# Patient Record
Sex: Female | Born: 1945
Health system: Southern US, Community
[De-identification: ages and names within clinical notes are randomized; demographics above are authoritative.]

## PROBLEM LIST (undated history)

## (undated) DIAGNOSIS — T7840XA Allergy, unspecified, initial encounter: Secondary | ICD-10-CM

## (undated) DIAGNOSIS — IMO0001 Reserved for inherently not codable concepts without codable children: Secondary | ICD-10-CM

## (undated) DIAGNOSIS — E119 Type 2 diabetes mellitus without complications: Secondary | ICD-10-CM

## (undated) DIAGNOSIS — M1990 Unspecified osteoarthritis, unspecified site: Secondary | ICD-10-CM

## (undated) DIAGNOSIS — J302 Other seasonal allergic rhinitis: Secondary | ICD-10-CM

## (undated) DIAGNOSIS — I471 Supraventricular tachycardia, unspecified: Secondary | ICD-10-CM

## (undated) DIAGNOSIS — E785 Hyperlipidemia, unspecified: Secondary | ICD-10-CM

## (undated) DIAGNOSIS — J069 Acute upper respiratory infection, unspecified: Secondary | ICD-10-CM

## (undated) DIAGNOSIS — D219 Benign neoplasm of connective and other soft tissue, unspecified: Secondary | ICD-10-CM

## (undated) DIAGNOSIS — R3915 Urgency of urination: Secondary | ICD-10-CM

## (undated) DIAGNOSIS — J309 Allergic rhinitis, unspecified: Secondary | ICD-10-CM

## (undated) DIAGNOSIS — G4730 Sleep apnea, unspecified: Secondary | ICD-10-CM

## (undated) DIAGNOSIS — N950 Postmenopausal bleeding: Secondary | ICD-10-CM

## (undated) DIAGNOSIS — Z98811 Dental restoration status: Secondary | ICD-10-CM

## (undated) DIAGNOSIS — I10 Essential (primary) hypertension: Secondary | ICD-10-CM

## (undated) DIAGNOSIS — D649 Anemia, unspecified: Secondary | ICD-10-CM

## (undated) DIAGNOSIS — Z923 Personal history of irradiation: Secondary | ICD-10-CM

## (undated) DIAGNOSIS — G4733 Obstructive sleep apnea (adult) (pediatric): Secondary | ICD-10-CM

## (undated) DIAGNOSIS — C50919 Malignant neoplasm of unspecified site of unspecified female breast: Secondary | ICD-10-CM

## (undated) DIAGNOSIS — L309 Dermatitis, unspecified: Secondary | ICD-10-CM

## (undated) DIAGNOSIS — R51 Headache: Secondary | ICD-10-CM

## (undated) DIAGNOSIS — Z8742 Personal history of other diseases of the female genital tract: Secondary | ICD-10-CM

## (undated) DIAGNOSIS — H409 Unspecified glaucoma: Secondary | ICD-10-CM

## (undated) DIAGNOSIS — M4800 Spinal stenosis, site unspecified: Secondary | ICD-10-CM

## (undated) DIAGNOSIS — J45909 Unspecified asthma, uncomplicated: Secondary | ICD-10-CM

## (undated) HISTORY — DX: Spinal stenosis, site unspecified: M48.00

## (undated) HISTORY — DX: Sleep apnea, unspecified: G47.30

## (undated) HISTORY — DX: Dermatitis, unspecified: L30.9

## (undated) HISTORY — DX: Obstructive sleep apnea (adult) (pediatric): G47.33

## (undated) HISTORY — DX: Unspecified asthma, uncomplicated: J45.909

## (undated) HISTORY — DX: Postmenopausal bleeding: N95.0

## (undated) HISTORY — DX: Allergy, unspecified, initial encounter: T78.40XA

## (undated) HISTORY — DX: Personal history of irradiation: Z92.3

## (undated) HISTORY — DX: Malignant neoplasm of unspecified site of unspecified female breast: C50.919

## (undated) HISTORY — DX: Benign neoplasm of connective and other soft tissue, unspecified: D21.9

## (undated) HISTORY — DX: Acute upper respiratory infection, unspecified: J06.9

## (undated) HISTORY — DX: Allergic rhinitis, unspecified: J30.9

## (undated) HISTORY — DX: Hyperlipidemia, unspecified: E78.5

---

## 1993-03-19 HISTORY — PX: CHOLECYSTECTOMY: SHX55

## 1995-03-20 HISTORY — PX: BACK SURGERY: SHX140

## 1997-12-30 ENCOUNTER — Other Ambulatory Visit: Admission: RE | Admit: 1997-12-30 | Discharge: 1997-12-30 | Payer: Self-pay | Admitting: *Deleted

## 1999-04-05 ENCOUNTER — Other Ambulatory Visit: Admission: RE | Admit: 1999-04-05 | Discharge: 1999-04-05 | Payer: Self-pay | Admitting: *Deleted

## 1999-04-20 ENCOUNTER — Encounter (INDEPENDENT_AMBULATORY_CARE_PROVIDER_SITE_OTHER): Payer: Self-pay | Admitting: Specialist

## 1999-04-20 ENCOUNTER — Other Ambulatory Visit: Admission: RE | Admit: 1999-04-20 | Discharge: 1999-04-20 | Payer: Self-pay | Admitting: *Deleted

## 2000-12-13 ENCOUNTER — Other Ambulatory Visit: Admission: RE | Admit: 2000-12-13 | Discharge: 2000-12-13 | Payer: Self-pay | Admitting: *Deleted

## 2002-03-24 ENCOUNTER — Other Ambulatory Visit: Admission: RE | Admit: 2002-03-24 | Discharge: 2002-03-24 | Payer: Self-pay | Admitting: *Deleted

## 2003-03-20 HISTORY — PX: DILATION AND CURETTAGE OF UTERUS: SHX78

## 2004-02-15 ENCOUNTER — Other Ambulatory Visit: Admission: RE | Admit: 2004-02-15 | Discharge: 2004-02-15 | Payer: Self-pay | Admitting: Gynecology

## 2005-03-15 ENCOUNTER — Other Ambulatory Visit: Admission: RE | Admit: 2005-03-15 | Discharge: 2005-03-15 | Payer: Self-pay | Admitting: Gynecology

## 2007-03-20 HISTORY — PX: COLONOSCOPY W/ POLYPECTOMY: SHX1380

## 2007-06-30 ENCOUNTER — Ambulatory Visit (HOSPITAL_COMMUNITY): Admission: RE | Admit: 2007-06-30 | Discharge: 2007-07-01 | Payer: Self-pay | Admitting: *Deleted

## 2007-06-30 HISTORY — PX: EXPLORATORY LAPAROTOMY: SUR591

## 2007-08-08 ENCOUNTER — Ambulatory Visit: Payer: Self-pay | Admitting: Gastroenterology

## 2007-08-22 ENCOUNTER — Ambulatory Visit: Payer: Self-pay | Admitting: Gastroenterology

## 2007-08-25 ENCOUNTER — Encounter: Payer: Self-pay | Admitting: Gastroenterology

## 2007-12-29 ENCOUNTER — Other Ambulatory Visit: Admission: RE | Admit: 2007-12-29 | Discharge: 2007-12-29 | Payer: Self-pay | Admitting: Gynecology

## 2008-01-16 ENCOUNTER — Ambulatory Visit: Admission: RE | Admit: 2008-01-16 | Discharge: 2008-01-16 | Payer: Self-pay | Admitting: Gynecology

## 2009-05-06 ENCOUNTER — Ambulatory Visit (HOSPITAL_COMMUNITY): Admission: RE | Admit: 2009-05-06 | Discharge: 2009-05-06 | Payer: Self-pay | Admitting: Obstetrics and Gynecology

## 2009-05-06 HISTORY — PX: HYSTEROSCOPY W/D&C: SHX1775

## 2010-06-07 LAB — COMPREHENSIVE METABOLIC PANEL
ALT: 25 U/L (ref 0–35)
AST: 25 U/L (ref 0–37)
CO2: 24 mEq/L (ref 19–32)
Calcium: 9.8 mg/dL (ref 8.4–10.5)
Chloride: 102 mEq/L (ref 96–112)
GFR calc Af Amer: >60 mL/min (ref >60)
GFR calc non Af Amer: >60 mL/min (ref >60)
Potassium: 3.9 mEq/L (ref 3.5–5.1)
Sodium: 134 mEq/L — ABNORMAL LOW (ref 135–145)
Total Bilirubin: 1.1 mg/dL (ref 0.3–1.2)

## 2010-06-07 LAB — CBC
RBC: 4.79 MIL/uL (ref 3.87–5.11)
WBC: 8.8 10*3/uL (ref 4.0–10.5)

## 2010-08-01 NOTE — Discharge Summary (Signed)
NAME:  [name], [name]              ACCOUNT NO.:  [id number]   MEDICAL RECORD NO.:  [id number]          PATIENT TYPE:  OIB   LOCATION:  9305                          FACILITY:  [facility]   PHYSICIAN:  Almedia Balls. Fore, M.D.   DATE OF BIRTH:  1945/08/12   DATE OF ADMISSION:  06/30/2007  DATE OF DISCHARGE:  07/01/2007                               DISCHARGE SUMMARY   HISTORY:  The patient is a 65 year old with persistent postmenopausal  bleeding for hysterectomy on June 30, 2007.  The remainder of her  history and physical are as previously dictated.   LABORATORY DATA:  Preoperative hemoglobin 13.1.  EKG with notation  cannot rule out anterior infarct and therefore abnormal EKG.   HOSPITAL COURSE:  The patient was taken to the operating room on June 30, 2007, at which time exploratory laparotomy was performed with  findings of dense pelvic adhesions involving the uterus and the lower  rectosigmoid.  This was felt to be related to probable endometriosis.  Because of the density of these adhesions, it was impossible to perform  hysterectomy as planned.  Therefore, after exploratory laparotomy, the  procedure was terminated.  Hospital course remaining overnight the  patient did well.  Diet and ambulation were progressed over the evening  of June 30, 2007, and early morning of July 01, 2007.  On the morning  of July 01, 2007,  she was afebrile and experiencing no problems, it  was felt that she could be discharged at this time.   FINAL DIAGNOSIS:  Persisting postmenopausal bleeding.   OPERATION:  Exploratory laparotomy, no pathology report.   DISPOSITION:  Discharged home to return to the office in 2 weeks for  followup and to gradually progress her activities over several weeks at  home.  She was instructed to limit lifting and driving for 2 weeks.  She  was fully ambulatory, on a regular diet, and in good condition at the  time of discharge.  She had been given a prescription for  hydrocodone/APAP 10/325, #30 to be taken one q.6 h. p.r.n. pain.           ______________________________  Almedia Balls. [name], M.D.     SRF/MEDQ  D:  07/01/2007  T:  07/02/2007  Job:  [id number]

## 2010-08-01 NOTE — H&P (Signed)
NAME:  [name], [name]              ACCOUNT NO.:  [id number]   MEDICAL RECORD NO.:  [id number]          [name] TYPE:  AMB   LOCATION:  SDC                           FACILITY:  [facility]   PHYSICIAN:  Almedia Balls. Fore, M.D.   DATE OF BIRTH:  [birth year]   DATE OF ADMISSION:  06/30/2007  DATE OF DISCHARGE:                              HISTORY & PHYSICAL   HISTORY:  The [name] is a 65 year old gravida 4, para 4, who has had  persistent postmenopausal bleeding over several years.  She has been  sampled several times and had underwent hysteroscopy D&C in February  2007 with findings of hyperplasia without atypia.  She has had  __________  aspirates since then, most recently within about the past 8  months, with benign findings.  She has persisted in her bleeding without  obvious etiology.  This has been attempted to be controlled with  progestational agents without success.  She is admitted at this time for  the persistent abnormal postmenopausal bleeding for hysterectomy,  possible bilateral salpingo-oophorectomy.  She has been fully counseled  as to the nature of the procedure and the risks involved, to include  risks of anesthesia, injury to bowel, bladder, blood vessels, ureters,  postoperative hemorrhage, infection, recuperation.  She fully  understands all these considerations and wishes to proceed on 30 June 2007.   PAST MEDICAL HISTORY:  Includes cholecystectomy in 95, disk surgery in  97, hysteroscopy D&C as noted above.  She takes medication for  occasional asthmatic changes, and this is related to seasonal allergies.   ALLERGIES:  SHE IS ALLERGIC TO NO MEDICATIONS.   FAMILY HISTORY:  Includes paternal and maternal aunt with cancer of  unknown type.  Mother and maternal grandfather and sister with heart  disease.   REVIEW OF SYSTEMS:  HEENT:  Headaches occasionally prior.  CARDIO-  RESPIRATORY: Negative.  GASTROINTESTINAL:  Negative.  GENITOURINARY:  As  in present illness.   NEUROMUSCULAR:  Negative.   PHYSICAL EXAMINATION:  VITAL SIGNS:  Height 5 feet 2 inches, weight 230  pounds, blood pressure 184/80.  This has ranged as low as 140s/70s,  pulse 84, respirations 18.  GENERAL:  Well-developed white female in no acute distress.  HEENT: Within normal limits.  NECK:  Supple without masses, adenopathy or bruits.  HEART:  Regular rate and rhythm without murmurs.  LUNGS:  Clear to P&A.  BREASTS:  Symmetrical without mass.  Axilla negative.  ABDOMEN:  Flat and soft without mass, nontender, increased panniculus.  PELVIC:  External genitalia:  Bartholins, urethra and Skene's glands  within normal limits.  Cervix is high and slightly inflamed.  Uterus is  mid-posterior, approximately 8 to [redacted] weeks gestational size, nontender.  There are no palpable adnexal masses.  EXTREMITIES:  Within normal limits.  CENTRAL NERVOUS SYSTEM:  Grossly intact.  SKIN:  Without suspicious lesions.   IMPRESSION:  PERSISTENT RECURRING POSTMENOPAUSAL BLEEDING.   DISPOSITION:  As noted above.  Of note is that a Pap smear has been  normal recently.           ______________________________  Almedia Balls.  [name], M.D.     SRF/MEDQ  D:  06/23/2007  T:  06/23/2007  Job:  [id number]

## 2010-08-01 NOTE — Op Note (Signed)
NAME:  [name], [name]              ACCOUNT NO.:  [id number]   MEDICAL RECORD NO.:  [id number]          [name] TYPE:  AMB   LOCATION:  SDC                           [facility]ACILITY:  [facility]   PHYSICIAN:  Almedia Balls. Fore, M.D.   DATE OF BIRTH:  [birth year]   DATE OF PROCEDURE:  06/30/2007  DATE OF DISCHARGE:                               OPERATIVE REPORT   PREOPERATIVE DIAGNOSIS:  Postmenopausal bleeding - persistent.   POSTOPERATIVE DIAGNOSIS:  Postmenopausal bleeding - persistent.  Extensive endometriosis with dense pelvic adhesions.   OPERATION:  Exploratory laparotomy.   ANESTHESIA:  General orotracheal.   OPERATOR:  Almedia Balls. [name], M.D.   FIRST ASSISTANT:  Gretta Cool, M.D.   INDICATIONS FOR SURGERY:  The [name] is a 65 year old with the above-  noted problems who has been counseled as to the need for surgery to  treat these problems.  She was fully counseled as to the nature of the  procedure and the risks involved to include risks of anesthesia, injury  to bowel, bladder, blood vessels, ureters, postoperative hemorrhage,  infection, recuperation and possible hormone replacement should her  ovaries be removed.  She did wish to retain her ovaries if they were  normal.  She fully understands all these considerations and has signed  informed consent to proceed on June 30, 2007.   OPERATIVE FINDINGS:  On an attempt to effect descent of the uterus, it  was found that the uterus was quite fixed in the pelvis and would not  descend at all.  Accordingly, an exploratory laparotomy was performed.  After entry into the peritoneum, palpation of the upper abdominal  viscera revealed all to be normal.  In the pelvis, the uterus was mid  position and very densely adherent with apparent endometriosis to the  descending rectosigmoid.  Dissection planes were not found, making  probable resection of bowel necessary to proceed with this proposed  operation.  Because of the normal appearance of  the uterus, otherwise  and ovaries and tubes, it was felt that no further surgery should be  performed at this time.   PROCEDURE:  With the [name] under general anesthesia, prepared and  draped in the usual sterile fashion in the dorsal lithotomy position,  speculum was placed in vagina.  An attempt was made to effect descent of  the uterus by attaching a tenaculum to the anterior lip of the cervix.  The uterus would not descend of the pelvis into the vaginal area.  Accordingly, the [name] was reprepared and draped for laparotomy  procedure.  A lower abdominal transverse incision was made and carried  into the peritoneal cavity with placement of a self-retaining retractor  and the bowel being packed off.  The above-noted findings were then  palpated and/or visualized.  Again because of the risks to the bowel, it  was felt that no further surgery was indicated.  Accordingly, after  ascertaining that sponge counts were correct and hemostasis was  maintained, the peritoneum was closed with a continuous suture of zero  Vicryl.  Fascia was closed with two sutures of zero PDS which  were  brought from the lateral aspects of the incision and tied together in  the midline.  Subcutaneous fat was reapproximated with interrupted  horizontal mattress sutures of zero  Vicryl.  Skin was closed with a subcuticular suture of 3-0 plain catgut.  Estimated blood loss less than 25 mL.  The [name] was taken to the  recovery room in good condition with clear urine in the Foley catheter  tubing.  She will be placed on outpatient with extended recovery status  following PACU.           ______________________________  Almedia Balls. [name], M.D.     SRF/MEDQ  D:  06/30/2007  T:  06/30/2007  Job:  [id number]   cc:   Gretta Cool, M.D.  Fax: [phone]

## 2010-08-01 NOTE — Consult Note (Signed)
NAME:  [name], [name] NO.:  [id number]   MEDICAL RECORD NO.:  [id number]          PATIENT TYPE:  OUT   LOCATION:  GYN                          FACILITY:  [facility]   PHYSICIAN:  De Blanch, M.D.DATE OF BIRTH:  1945/08/26   DATE OF CONSULTATION:  01/16/2008  DATE OF DISCHARGE:                                 CONSULTATION   CHIEF COMPLAINT:  Abnormal postmenopausal uterine bleeding and  endometrial hyperplasia.   HISTORY OF PRESENT ILLNESS:  A 65 year old white female seen in  consultation at the request of Dr. Teodora Medici regarding management of  abnormal bleeding and endometrial hyperplasia.  The patient has had  abnormal bleeding for at least the last 4 years and has had two D and Cs  along the way as well as a number of endometrial biopsies.  On most  endometrial biopsies she has endometrial hyperplasia without atypia.  None of her biopsies have shown atypia.  Some have shown proliferative  endometrium.  The patient has been treated with Provera in the past.  It  is difficult for me to ascertain the exact regimen.  In questioning the  patient she says that many times over the last 5 years she has only  taken the Provera for 5-10 days and it has not been repeated in  subsequent months.  In April of 2009 the patient underwent exploratory  laparotomy for a planned hysterectomy to stop her bleeding.  Apparently  extensive adhesions were encountered and the hysterectomy was aborted.   Most recently the patient was placed on Provera 30 mg a day for 10 days.  She claims that she bled through that regimen but then took prenatal  multivitamin and the bleeding stopped.  The patient is quite adamant  that the multivitamin has helped stop her bleeding on previous occasions  as well.   PAST MEDICAL HISTORY:  Medical illnesses - diabetes (untreated), urinary  tract infection, diverticulitis.   PAST SURGICAL HISTORY:  Exploratory laparotomy, cholecystectomy, D and  C  x2.   OBSTETRICAL HISTORY:  Gravida 4.   CURRENT MEDICATIONS:  None.   DRUG ALLERGIES:  None.   REVIEW OF SYSTEMS:  A 10 point comprehensive review of systems is  negative except as noted above.   PHYSICAL EXAM:  VITAL SIGNS:  Weight 237 pounds, height 5 feet 2 inches,  blood pressure 151/87, pulse 102.  GENERAL:  The patient is a pleasant obese white female in no acute  distress.  HEENT:  Negative.  NECK:  Supple without thyromegaly.  There is no supraclavicular or  inguinal adenopathy.  ABDOMEN:  The abdomen is obese, soft, nontender.  No masses,  organomegaly, ascites or hernias noted.  PELVIC:  EG/BUS/vagina/bladder/urethra are normal.  Cervix appears  normal.  There is no bleeding today.  Bimanual exam was compromised by  the patient's obesity.  I do not feel any large masses.  Rectovaginal  exam confirms.   IMPRESSION:  Postmenopausal bleeding, status post multiple biopsies and  D and Cs which show only hyperplasia.  I explained to the patient and  her daughter the natural history and etiology  for this problem being  unopposed estrogen.  While she has been given Provera in the past  apparently these have been short courses.  I would suggest that we treat  the patient with continuous Provera 10 mg daily for the next 3 months.  She was informed she should have some spotting here and there but it  should get better.  She will contact us if she has any heavy bleeding.  She is scheduled to see Dr. Chevis Pretty in approximately 3 months and I would  suggest she have a repeat biopsy at that time.  The patient is also  encouraged to keep a calendar of her bleeding pattern.      De Blanch, M.D.  Electronically Signed     DC/MEDQ  D:  01/16/2008  T:  01/17/2008  Job:  [id number]   cc:   Leatha Gilding. Mezer, M.D.  Fax: [phone]   Telford Nab, R.N.  [address]  [place], [place] [address]

## 2010-12-12 LAB — CBC
MCHC: 33.8
MCV: 84.9
Platelets: 357
RBC: 4.56
RDW: 14.6

## 2011-03-05 ENCOUNTER — Encounter (INDEPENDENT_AMBULATORY_CARE_PROVIDER_SITE_OTHER): Payer: Medicare Other | Admitting: Family Medicine

## 2011-03-05 DIAGNOSIS — E119 Type 2 diabetes mellitus without complications: Secondary | ICD-10-CM

## 2011-03-05 DIAGNOSIS — Z01419 Encounter for gynecological examination (general) (routine) without abnormal findings: Secondary | ICD-10-CM

## 2011-03-05 DIAGNOSIS — Z0000 Encounter for general adult medical examination without abnormal findings: Secondary | ICD-10-CM

## 2011-04-26 ENCOUNTER — Encounter: Payer: Self-pay | Admitting: Gynecologic Oncology

## 2011-04-30 ENCOUNTER — Other Ambulatory Visit: Payer: Self-pay | Admitting: Gynecology

## 2011-04-30 ENCOUNTER — Ambulatory Visit: Payer: Medicare Other | Attending: Gynecology | Admitting: Gynecology

## 2011-04-30 ENCOUNTER — Encounter: Payer: Self-pay | Admitting: Gynecology

## 2011-04-30 VITALS — BP 168/70 | HR 88 | Temp 97.8°F | Resp 20 | Ht 61.5 in | Wt 232.9 lb

## 2011-04-30 DIAGNOSIS — N8501 Benign endometrial hyperplasia: Secondary | ICD-10-CM | POA: Diagnosis not present

## 2011-04-30 DIAGNOSIS — Z7982 Long term (current) use of aspirin: Secondary | ICD-10-CM | POA: Insufficient documentation

## 2011-04-30 DIAGNOSIS — N950 Postmenopausal bleeding: Secondary | ICD-10-CM | POA: Diagnosis not present

## 2011-04-30 DIAGNOSIS — E119 Type 2 diabetes mellitus without complications: Secondary | ICD-10-CM | POA: Insufficient documentation

## 2011-04-30 DIAGNOSIS — N939 Abnormal uterine and vaginal bleeding, unspecified: Secondary | ICD-10-CM

## 2011-04-30 DIAGNOSIS — N898 Other specified noninflammatory disorders of vagina: Secondary | ICD-10-CM | POA: Diagnosis not present

## 2011-04-30 DIAGNOSIS — Z79899 Other long term (current) drug therapy: Secondary | ICD-10-CM | POA: Insufficient documentation

## 2011-04-30 NOTE — Patient Instructions (Signed)
We will call you with the endometrial biopsy report and make plans for further management thereafter

## 2011-04-30 NOTE — Progress Notes (Signed)
Consult Note: Gyn-Onc   [name] 66 y.o. female  Chief Complaint  Patient presents with  . Postmenopausal Bleeding    Follow up    Interval History: The patient returns at the request of Dr. Chevis Pretty for further management of vaginal bleeding.  The patient says her bleeding is typified by spotting and not on every day.  She has no other pelvic or gyn symptoms.  Dr. Corwin Levins note says a recent endometrial stripe measured 2 cm.  No biopsy has been done yet.    HPI: The patient has a long-standing history of abnormal bleeding. She has undergone several D&Cs and endometrial biopsies some of which have shown simple hyperplasia without atypia. For a time she was treated with Provera but on an inconsistent regimen. She underwent exploratory laparotomy in April 2009 for a planned hysterectomy to manage her bleeding. However extensive adhesions were encountered and the procedure was abandoned. In February 2011 she underwent hysteroscopy and D&C which revealed superficial fragments of weakly proliferative endometrium. No atypia hyperplasia or malignancy was identified. It is interesting to note that this was proliferative endometrium given the fact that the patient was supposed to have been taking Provera. Since 2011 she has not received any additional hormonal management.  Allergies  Allergen Reactions  . Latex     Causes blisters    Past Medical History  Diagnosis Date  . Postmenopausal bleeding   . Fibroids   . Diabetes mellitus   . Diverticulitis   . Endometrial hyperplasia     Past Surgical History  Procedure Date  . Hysteroscopy w/d&c 04/2009  . Abdominal hysterectomy 06/2007  . Cholecystectomy   . Back surgery   . Dilation and curettage of uterus   . Exploratory laparotomy     Current Outpatient Prescriptions  Medication Sig Dispense Refill  . aspirin 81 MG tablet Take 81 mg by mouth daily.      . Cholecalciferol (VITAMIN D-3 PO) Take by mouth daily.      . Cyanocobalamin  (VITAMIN B-12 CR PO) Take 1 tablet by mouth daily. Unsure of dose      . LISINOPRIL PO Take 1 tablet by mouth daily.      . metFORMIN (GLUCOPHAGE) 500 MG tablet Take 1,000 mg by mouth daily.      Marland Kitchen PRAVASTATIN SODIUM PO Take 1 tablet by mouth daily.      . Prenatal Multivit-Min-Fe-FA (PRENATAL 1 + IRON PO) Take by mouth daily.      . vitamin C (ASCORBIC ACID) 500 MG tablet Take 500 mg by mouth daily.        History   Social History  . Marital Status: Married    Spouse Name: N/A    Number of Children: N/A  . Years of Education: N/A   Occupational History  . Not on file.   Social History Main Topics  . Smoking status: Never Smoker   . Smokeless tobacco: Not on file  . Alcohol Use: No  . Drug Use: No  . Sexually Active:    Other Topics Concern  . Not on file   Social History Narrative  . No narrative on file    No family history on file.  Review of Systems: 10 point review of systems negative except as noted above  Vitals: Blood pressure 168/70, pulse 88, temperature 97.8 F (36.6 C), temperature source Oral, resp. rate 20, height 5' 1.5" (1.562 m), weight 232 lb 14.4 oz (105.643 kg).  Physical Exam: In general patient  is a healthy white female in no acute distress  HEENT is negative  Neck is supple without thyromegaly  There is no supraclavicular or inguinal adenopathy.  The abdomen is obese soft nontender no masses organomegaly ascites or hernias are noted. Pelvic exam  EGBUS vagina bladder urethra are normal  Cervix is normal uterus is anterior normal shape size and consistency  Bimanual exam reveals no adnexal masses  Rectovaginal exam confirms. It's noted that the patient is not having any bleeding at the present time.  Procedure note:  Endometrial biopsies obtained a retrieving a modest amount of endometrial tissue.    Assessment/Plan: Continued abnormal uterine bleeding. With aid endometrial stripe of 2 cm we've obtained an endometrial biopsy for  reassessment. We'll contact the patient with that report and make further recommendations thereafter.   Diane Corpus, MD 04/30/2011, 12:10 PM                         Consult Note: Gyn-Onc   [name] 66 y.o. female  Chief Complaint  Patient presents with  . Postmenopausal Bleeding    Follow up    Interval History:   HPI:  Allergies  Allergen Reactions  . Latex     Causes blisters    Past Medical History  Diagnosis Date  . Postmenopausal bleeding   . Fibroids   . Diabetes mellitus   . Diverticulitis   . Endometrial hyperplasia     Past Surgical History  Procedure Date  . Hysteroscopy w/d&c 04/2009  . Abdominal hysterectomy 06/2007  . Cholecystectomy   . Back surgery   . Dilation and curettage of uterus   . Exploratory laparotomy     Current Outpatient Prescriptions  Medication Sig Dispense Refill  . aspirin 81 MG tablet Take 81 mg by mouth daily.      . Cholecalciferol (VITAMIN D-3 PO) Take by mouth daily.      . Cyanocobalamin (VITAMIN B-12 CR PO) Take 1 tablet by mouth daily. Unsure of dose      . LISINOPRIL PO Take 1 tablet by mouth daily.      . metFORMIN (GLUCOPHAGE) 500 MG tablet Take 1,000 mg by mouth daily.      Marland Kitchen PRAVASTATIN SODIUM PO Take 1 tablet by mouth daily.      . Prenatal Multivit-Min-Fe-FA (PRENATAL 1 + IRON PO) Take by mouth daily.      . vitamin C (ASCORBIC ACID) 500 MG tablet Take 500 mg by mouth daily.        History   Social History  . Marital Status: Married    Spouse Name: N/A    Number of Children: N/A  . Years of Education: N/A   Occupational History  . Not on file.   Social History Main Topics  . Smoking status: Never Smoker   . Smokeless tobacco: Not on file  . Alcohol Use: No  . Drug Use: No  . Sexually Active:    Other Topics Concern  . Not on file   Social History Narrative  . No narrative on file    No family history on file.  Review of Systems:  Vitals: Blood  pressure 168/70, pulse 88, temperature 97.8 F (36.6 C), temperature source Oral, resp. rate 20, height 5' 1.5" (1.562 m), weight 232 lb 14.4 oz (105.643 kg).  Physical Exam:  Assessment/Plan:   Diane Corpus, MD 04/30/2011, 12:10 PM

## 2011-05-04 ENCOUNTER — Telehealth: Payer: Self-pay | Admitting: Gynecologic Oncology

## 2011-05-04 NOTE — Telephone Encounter (Signed)
Spoke with patient about path results and Dr Clarke-Pearson's recommendations for a D&C to remove the polyps.  Procedure scheduled for Friday June 22, 2011.

## 2011-05-14 ENCOUNTER — Telehealth: Payer: Self-pay

## 2011-05-14 NOTE — Telephone Encounter (Signed)
.  [facility]     PT REQUESTING 3 MTH SUPPLY  OF MEDS,PRENATAL VIT,LINSOPRIL,METFORMIN,PREVASTATIN     [facility]

## 2011-05-15 MED ORDER — PRAVASTATIN SODIUM 40 MG PO TABS: 40.0000 mg | ORAL_TABLET | Freq: Every day | ORAL | Status: DC

## 2011-05-15 MED ORDER — METFORMIN HCL ER 500 MG PO TB24: 500.0000 mg | ORAL_TABLET | Freq: Two times a day (BID) | ORAL | Status: DC

## 2011-05-15 MED ORDER — LISINOPRIL-HYDROCHLOROTHIAZIDE 10-12.5 MG PO TABS: 1.0000 | ORAL_TABLET | Freq: Every day | ORAL | Status: DC

## 2011-05-15 MED ORDER — PRENATAL RX 60-1 MG PO TABS: 1.0000 | ORAL_TABLET | Freq: Every day | ORAL | Status: AC

## 2011-05-15 NOTE — Telephone Encounter (Signed)
Chart pulled to nurses station

## 2011-05-15 NOTE — Telephone Encounter (Signed)
Can rf for 3 months, but will need follow up in march.  [name]

## 2011-05-16 NOTE — Telephone Encounter (Signed)
Told pt Rxs were sent in and due for f/up in March.

## 2011-06-12 ENCOUNTER — Encounter (HOSPITAL_BASED_OUTPATIENT_CLINIC_OR_DEPARTMENT_OTHER): Payer: Self-pay | Admitting: *Deleted

## 2011-06-13 ENCOUNTER — Encounter (HOSPITAL_BASED_OUTPATIENT_CLINIC_OR_DEPARTMENT_OTHER): Payer: Self-pay | Admitting: *Deleted

## 2011-06-13 NOTE — Progress Notes (Addendum)
NPO AFTER MN. ARRIVES AT 0600. PRE-OP ORDERS PENDING, LM W/ NANCY WILKINSON RN.  OTHERWISE PT NEEDS ISTAT AND EKG.  PT TOLD TO STOP ASA ON 06-15-11.

## 2011-06-14 ENCOUNTER — Ambulatory Visit (INDEPENDENT_AMBULATORY_CARE_PROVIDER_SITE_OTHER): Payer: Medicare Other | Admitting: Family Medicine

## 2011-06-14 VITALS — BP 154/81 | HR 85 | Temp 97.9°F | Resp 16 | Ht 62.0 in | Wt 234.0 lb

## 2011-06-14 DIAGNOSIS — H6590 Unspecified nonsuppurative otitis media, unspecified ear: Secondary | ICD-10-CM | POA: Diagnosis not present

## 2011-06-14 DIAGNOSIS — I10 Essential (primary) hypertension: Secondary | ICD-10-CM

## 2011-06-14 DIAGNOSIS — E785 Hyperlipidemia, unspecified: Secondary | ICD-10-CM | POA: Diagnosis not present

## 2011-06-14 DIAGNOSIS — E119 Type 2 diabetes mellitus without complications: Secondary | ICD-10-CM

## 2011-06-14 LAB — GLUCOSE, POCT (MANUAL RESULT ENTRY): POC Glucose: 174

## 2011-06-14 MED ORDER — METFORMIN HCL ER 500 MG PO TB24: 500.0000 mg | ORAL_TABLET | Freq: Two times a day (BID) | ORAL | Status: DC

## 2011-06-14 MED ORDER — LISINOPRIL-HYDROCHLOROTHIAZIDE 10-12.5 MG PO TABS: 1.0000 | ORAL_TABLET | Freq: Every day | ORAL | Status: DC

## 2011-06-14 MED ORDER — PRAVASTATIN SODIUM 40 MG PO TABS: 40.0000 mg | ORAL_TABLET | Freq: Every evening | ORAL | Status: DC

## 2011-06-14 MED ORDER — FLUTICASONE PROPIONATE 50 MCG/ACT NA SUSP: 1.0000 | Freq: Every day | NASAL | Status: DC

## 2011-06-14 NOTE — Progress Notes (Signed)
Subjective:    Patient ID: [name], female    DOB: April 19, 1945, 66 y.o.   MRN: [id number]  HPI [name] is a 66 y.o. female Hx of DM2,, hyperlipidemia, HTN, - last seen for physical 03/05/11. Needs meds refilled through mail. DM2 - last A1c 7.0 03/04/12.  On metformin 500 mg - 2 qd at same time - misses 2 doses per month. Blood sugar 173 this am.  Has not seen nutritionist - didn't get referral.  Weight up 4 pounds since 12/12 office visit.  Forgets cholesterol medicine at times - 2 times per week.    HTN - last outside BP 123/69  1 week ago.  Usually higher in doctor's office.    Uterine bleeding for years, followed by Dr. Hillery Hunter, tried hysterectomy 2009 - unable to complete due to scar scar tissue.  Planning on [facility] and hysteroscopy April 5th  - Dr. Stanford Breed.  Forgets to take PNV as in freezer - usually misses few doses per week.  Also c/o L ear pain - feels clogged, blocked past 1 week. No fever, sore 1 day - applied peroxide, then warm water.  Less itching,  Has had nasal/head congestion for 3 days.  No fever.  Dizzy at times today with head mvmt. today.  Review of Systems  Constitutional: Negative for fever and chills.  HENT: Positive for hearing loss, ear pain and congestion. Negative for ear discharge.   Respiratory: Negative for chest tightness.        Cough in am only - wears CPAP.  Cardiovascular: Negative for chest pain.  Genitourinary: Negative for vaginal bleeding and menstrual problem.  Neurological:       Microfilament testing intact.   Hx of allergies - felt like had medicine head with loratadine. Tried bee pollen instead - seemed.    Objective:   Physical Exam  Constitutional: She is oriented to person, place, and time. She appears well-developed and well-nourished.  HENT:  Head: Normocephalic and atraumatic.  Right Ear: Tympanic membrane, external ear and ear canal normal. Tympanic membrane is not erythematous.  Left Ear: External ear normal. A  middle ear effusion is present.  Nose: Mucosal edema present.  Mouth/Throat: Uvula is midline, oropharynx is clear and moist and mucous membranes are normal.       Few areas of dry skin in canal, minimal cerumen   Eyes: Conjunctivae are normal. Pupils are equal, round, and reactive to light.  Cardiovascular: Normal rate, regular rhythm, normal heart sounds and intact distal pulses.   Pulmonary/Chest: Effort normal and breath sounds normal.  Abdominal: Soft. There is no tenderness.  Neurological: She is alert and oriented to person, place, and time.       Microfilament testing wnl.  Skin: Skin is warm and dry.  Psychiatric: She has a normal mood and affect. Her behavior is normal.   Results for orders placed in visit on 06/14/11  GLUCOSE, POCT (MANUAL RESULT ENTRY)      Component Value Range   POC Glucose 174    POCT GLYCOSYLATED HEMOGLOBIN (HGB A1C)      Component Value Range   Hemoglobin A1C 7.3          Assessment & Plan:  [name] is a 66 y.o. female 1. DM2 (diabetes mellitus, type 2)  Amb ref to Medical Nutrition Therapy-MNT  2. HTN (hypertension)    3. Serous otitis media    4. Hyperlipidemia     Serous otitis - likely Allergic rhinitis component -  had se's with claritin, but tolerated flonase prior, but sore in nose at times.  Can restart flonase - correct technique reviewed.  If any new nasal sores rtc for eval.  Stop peroxide as this is likely drying ear canal. Saline ns throughout day as needed.  If any increased pain - rtc to recheck as no antibiotics indicated at this point and would delay surgery.  DM2 - referred to [facility] center.  Discussed wt loss and diet, and tips discussed for compliance with meds.  Cont pravastatin, metformin, and lisinopril/hct at current doses, but take metformin BID - not both at once.  Goal A1c less than 7 and diabetic progression with decreased control.  Recheck in 3 months. Faxed printed off meds rx for [facility].  HTN -  controlled out of office.  White coat component.  Cont same doses of meds, but bring record of outside BP's to next ov.  Recheck 3 months, sooner if needed.

## 2011-06-14 NOTE — Progress Notes (Signed)
Addended by: Jacqualyn Posey on: 06/14/2011 12:42 PM   Modules accepted: Orders

## 2011-06-14 NOTE — Patient Instructions (Signed)
If any increased pain in ear, or new sores in nose - return to clinic.  Recheck in 3 months for your diabetes. Return to the clinic or go to the nearest emergency room if any of your symptoms worsen or new symptoms occur.

## 2011-06-22 ENCOUNTER — Encounter (HOSPITAL_BASED_OUTPATIENT_CLINIC_OR_DEPARTMENT_OTHER): Payer: Self-pay | Admitting: *Deleted

## 2011-06-22 ENCOUNTER — Ambulatory Visit (HOSPITAL_BASED_OUTPATIENT_CLINIC_OR_DEPARTMENT_OTHER): Payer: Medicare Other | Admitting: Certified Registered"

## 2011-06-22 ENCOUNTER — Encounter (HOSPITAL_BASED_OUTPATIENT_CLINIC_OR_DEPARTMENT_OTHER): Payer: Self-pay | Admitting: Gynecology

## 2011-06-22 ENCOUNTER — Encounter (HOSPITAL_BASED_OUTPATIENT_CLINIC_OR_DEPARTMENT_OTHER): Payer: Self-pay | Admitting: Certified Registered"

## 2011-06-22 ENCOUNTER — Ambulatory Visit (HOSPITAL_BASED_OUTPATIENT_CLINIC_OR_DEPARTMENT_OTHER)
Admission: RE | Admit: 2011-06-22 | Discharge: 2011-06-22 | Disposition: A | Discharge status: Home / Self Care | Payer: Medicare Other | Source: Ambulatory Visit | Attending: Gynecology | Admitting: Gynecology

## 2011-06-22 ENCOUNTER — Encounter (HOSPITAL_BASED_OUTPATIENT_CLINIC_OR_DEPARTMENT_OTHER)
Admission: RE | Disposition: A | Discharge status: Home / Self Care | Payer: Self-pay | Source: Ambulatory Visit | Attending: Gynecology

## 2011-06-22 ENCOUNTER — Other Ambulatory Visit: Payer: Self-pay

## 2011-06-22 DIAGNOSIS — Z79899 Other long term (current) drug therapy: Secondary | ICD-10-CM | POA: Diagnosis not present

## 2011-06-22 DIAGNOSIS — N950 Postmenopausal bleeding: Secondary | ICD-10-CM | POA: Insufficient documentation

## 2011-06-22 DIAGNOSIS — N8501 Benign endometrial hyperplasia: Secondary | ICD-10-CM | POA: Diagnosis not present

## 2011-06-22 DIAGNOSIS — N939 Abnormal uterine and vaginal bleeding, unspecified: Secondary | ICD-10-CM

## 2011-06-22 DIAGNOSIS — Z7982 Long term (current) use of aspirin: Secondary | ICD-10-CM | POA: Diagnosis not present

## 2011-06-22 DIAGNOSIS — N926 Irregular menstruation, unspecified: Secondary | ICD-10-CM | POA: Diagnosis not present

## 2011-06-22 DIAGNOSIS — N898 Other specified noninflammatory disorders of vagina: Secondary | ICD-10-CM | POA: Diagnosis not present

## 2011-06-22 DIAGNOSIS — E119 Type 2 diabetes mellitus without complications: Secondary | ICD-10-CM | POA: Insufficient documentation

## 2011-06-22 DIAGNOSIS — N840 Polyp of corpus uteri: Secondary | ICD-10-CM | POA: Diagnosis not present

## 2011-06-22 HISTORY — DX: Unspecified osteoarthritis, unspecified site: M19.90

## 2011-06-22 HISTORY — DX: Essential (primary) hypertension: I10

## 2011-06-22 HISTORY — DX: Reserved for inherently not codable concepts without codable children: IMO0001

## 2011-06-22 HISTORY — PX: DILATION AND CURETTAGE OF UTERUS: SHX78

## 2011-06-22 LAB — BASIC METABOLIC PANEL
BUN: 13 mg/dL (ref 6–23)
CO2: 28 mEq/L (ref 19–32)
Chloride: 97 mEq/L (ref 96–112)
Creatinine, Ser: 0.75 mg/dL (ref 0.50–1.10)
Glucose, Bld: 184 mg/dL — ABNORMAL HIGH (ref 70–99)

## 2011-06-22 LAB — CBC
HCT: 38.4 % (ref 36.0–46.0)
MCV: 87.1 fL (ref 78.0–100.0)
RBC: 4.41 MIL/uL (ref 3.87–5.11)
WBC: 8.8 10*3/uL (ref 4.0–10.5)

## 2011-06-22 LAB — DIFFERENTIAL
Eosinophils Absolute: 0.4 10*3/uL (ref 0.0–0.7)
Eosinophils Relative: 5 % (ref 0–5)
Lymphs Abs: 2.2 10*3/uL (ref 0.7–4.0)
Monocytes Relative: 8 % (ref 3–12)

## 2011-06-22 LAB — GLUCOSE, CAPILLARY: Glucose-Capillary: 152 mg/dL — ABNORMAL HIGH (ref 70–99)

## 2011-06-22 SURGERY — DILATION AND CURETTAGE
Anesthesia: General | Site: Uterus | Wound class: Clean Contaminated

## 2011-06-22 MED ORDER — FENTANYL CITRATE 0.05 MG/ML IJ SOLN
INTRAMUSCULAR | Status: DC | PRN
  Administered 2011-06-22 (×2): 50 ug via INTRAVENOUS

## 2011-06-22 MED ORDER — LACTATED RINGERS IV SOLN: INTRAVENOUS | Status: DC

## 2011-06-22 MED ORDER — MIDAZOLAM HCL 5 MG/5ML IJ SOLN
INTRAMUSCULAR | Status: DC | PRN
  Administered 2011-06-22: 2 mg via INTRAVENOUS

## 2011-06-22 MED ORDER — KETOROLAC TROMETHAMINE 30 MG/ML IJ SOLN
30.0000 mg | Freq: Four times a day (QID) | INTRAMUSCULAR | Status: DC | PRN
  Administered 2011-06-22: 30 mg via INTRAVENOUS

## 2011-06-22 MED ORDER — LACTATED RINGERS IV SOLN
INTRAVENOUS | Status: DC
  Administered 2011-06-22 (×2): via INTRAVENOUS

## 2011-06-22 MED ORDER — LIDOCAINE HCL (CARDIAC) 20 MG/ML IV SOLN
INTRAVENOUS | Status: DC | PRN
  Administered 2011-06-22: 60 mg via INTRAVENOUS

## 2011-06-22 MED ORDER — FENTANYL CITRATE 0.05 MG/ML IJ SOLN: 25.0000 ug | INTRAMUSCULAR | Status: DC | PRN

## 2011-06-22 MED ORDER — PROMETHAZINE HCL 25 MG/ML IJ SOLN: 6.2500 mg | INTRAMUSCULAR | Status: DC | PRN

## 2011-06-22 MED ORDER — PROPOFOL 10 MG/ML IV EMUL
INTRAVENOUS | Status: DC | PRN
  Administered 2011-06-22: 200 mg via INTRAVENOUS

## 2011-06-22 MED ORDER — OXYCODONE-ACETAMINOPHEN 5-325 MG PO TABS
1.0000 | ORAL_TABLET | ORAL | Status: DC | PRN
  Administered 2011-06-22: 1 via ORAL

## 2011-06-22 SURGICAL SUPPLY — 15 items
CLOTH BEACON ORANGE TIMEOUT ST (SAFETY) ×2 IMPLANT
COVER TABLE BACK 60X90 (DRAPES) ×2 IMPLANT
DRAPE LG THREE QUARTER DISP (DRAPES) ×2 IMPLANT
DRAPE UNDERBUTTOCKS STRL (DRAPE) ×2 IMPLANT
DRESSING TELFA 8X3 (GAUZE/BANDAGES/DRESSINGS) ×2 IMPLANT
GLOVE BIO SURGEON STRL SZ7.5 (GLOVE) ×4 IMPLANT
GOWN PREVENTION PLUS LG XLONG (DISPOSABLE) ×2 IMPLANT
GOWN STRL REIN XL XLG (GOWN DISPOSABLE) ×2 IMPLANT
LEGGING LITHOTOMY PAIR STRL (DRAPES) ×2 IMPLANT
NEEDLE SPNL 22GX3.5 QUINCKE BK (NEEDLE) IMPLANT
PAD OB MATERNITY 4.3X12.25 (PERSONAL CARE ITEMS) ×2 IMPLANT
SYR CONTROL 10ML LL (SYRINGE) IMPLANT
TOWEL OR 17X24 6PK STRL BLUE (TOWEL DISPOSABLE) ×2 IMPLANT
UNDERPAD 30X30 INCONTINENT (UNDERPADS AND DIAPERS) ×2 IMPLANT
WATER STERILE IRR 500ML POUR (IV SOLUTION) ×2 IMPLANT

## 2011-06-22 NOTE — H&P (Signed)
Interval History: The patient returns at the request of Dr. Chevis Pretty for further management of vaginal bleeding. The patient says her bleeding is typified by spotting and not on every day. She has no other pelvic or gyn symptoms. Dr. Corwin Levins note says a recent endometrial stripe measured 2 cm. No biopsy has been done yet.  HPI: The patient has a long-standing history of abnormal bleeding. She has undergone several D&Cs and endometrial biopsies some of which have shown simple hyperplasia without atypia. For a time she was treated with Provera but on an inconsistent regimen. She underwent exploratory laparotomy in April 2009 for a planned hysterectomy to manage her bleeding. However extensive adhesions were encountered and the procedure was abandoned. In February 2011 she underwent hysteroscopy and D&C which revealed superficial fragments of weakly proliferative endometrium. No atypia hyperplasia or malignancy was identified. It is interesting to note that this was proliferative endometrium given the fact that the patient was supposed to have been taking Provera. Since 2011 she has not received any additional hormonal management.  Allergies   Allergen  Reactions   .  Latex      Causes blisters    Past Medical History   Diagnosis  Date   .  Postmenopausal bleeding    .  Fibroids    .  Diabetes mellitus    .  Diverticulitis    .  Endometrial hyperplasia     Past Surgical History   Procedure  Date   .  Hysteroscopy w/d&c  04/2009   .  Abdominal hysterectomy  06/2007   .  Cholecystectomy    .  Back surgery    .  Dilation and curettage of uterus    .  Exploratory laparotomy     Current Outpatient Prescriptions   Medication  Sig  Dispense  Refill   .  aspirin 81 MG tablet  Take 81 mg by mouth daily.     .  Cholecalciferol (VITAMIN D-3 PO)  Take by mouth daily.     .  Cyanocobalamin (VITAMIN B-12 CR PO)  Take 1 tablet by mouth daily. Unsure of dose     .  LISINOPRIL PO  Take 1 tablet by mouth daily.      .  metFORMIN (GLUCOPHAGE) 500 MG tablet  Take 1,000 mg by mouth daily.     Marland Kitchen  PRAVASTATIN SODIUM PO  Take 1 tablet by mouth daily.     .  Prenatal Multivit-Min-Fe-FA (PRENATAL 1 + IRON PO)  Take by mouth daily.     .  vitamin C (ASCORBIC ACID) 500 MG tablet  Take 500 mg by mouth daily.      History    Social History   .  Marital Status:  Married     Spouse Name:  N/A     Number of Children:  N/A   .  Years of Education:  N/A    Occupational History   .  Not on file.    Social History Main Topics   .  Smoking status:  Never Smoker   .  Smokeless tobacco:  Not on file   .  Alcohol Use:  No   .  Drug Use:  No   .  Sexually Active:     Other Topics  Concern   .  Not on file    Social History Narrative   .  No narrative on file    No family history on file.  Review of Systems: 10 point  review of systems negative except as noted above  Vitals: Blood pressure 168/70, pulse 88, temperature 97.8 F (36.6 C), temperature source Oral, resp. rate 20, height 5' 1.5" (1.562 m), weight 232 lb 14.4 oz (105.643 kg).  Physical Exam: In general patient is a healthy white female in no acute distress  HEENT is negative  Neck is supple without thyromegaly  There is no supraclavicular or inguinal adenopathy.  The abdomen is obese soft nontender no masses organomegaly ascites or hernias are noted.  Pelvic exam  EGBUS vagina bladder urethra are normal  Cervix is normal uterus is anterior normal shape size and consistency  Bimanual exam reveals no adnexal masses  Rectovaginal exam confirms. It's noted that the patient is not having any bleeding at the present time.  Procedure note:  Endometrial biopsies obtained a retrieving a modest amount of endometrial tissue.  Assessment/Plan: Continued abnormal uterine bleeding. With aid endometrial stripe of 2 cm we've obtained an endometrial biopsy for reassessment. We'll contact the patient with that report and make further recommendations thereafter.    Jeannette Corpus, MD

## 2011-06-22 NOTE — Discharge Instructions (Signed)
General Anesthetic, Adult A doctor specialized in giving anesthesia (anesthesiologist) or a nurse specialized in giving anesthesia (nurse anesthetist) gives medicine that makes you sleep while a procedure is performed (general anesthetic). Once the general anesthetic has been administered, you will be in a sleeplike state in which you feel no pain. After having a general anestheticyou may feel:   Dizzy.   Weak.   Drowsy.   Confused.  These feelings are normal and can be expected to last for up to 24 hours after the procedure is completed.  LET YOUR CAREGIVER KNOW ABOUT:  Allergies you have.   Medications you are taking, including herbs, eye drops, over the counter medications, dietary supplements, and creams.   Previous problems you have had with anesthetics or numbing medicines.   Use of cigarettes, alcohol, or illicit drugs.   Possibility of pregnancy, if this applies.   History of bleeding or blood disorders, including blood clots and clotting disorders.   Previous surgeries you have had and types of anesthetics you have received.   Family medical history, especially anesthetic problems.   Other health problems.  BEFORE THE PROCEDURE  You may brush your teeth on the morning of surgery but you should have no solid food or non-clear liquids for a minimum of 8 hours prior to your procedure. Clear liquids (water, black coffee, and tea) are acceptable in small amounts until 2 hours prior to your procedure.   You may take your regular medications the morning of your procedure unless your caregiver indicates otherwise.  AFTER THE PROCEDURE  After surgery, you will be taken to the recovery area where a nurse will monitor your progress. You will be allowed to go home when you are awake, stable, taking fluids well, and without serious pain or complications.   For the first 24 hours following an anesthetic:   Have a responsible person with you.   Do not drive a car. If you are  alone, do not take public transportation.   Do not engage in strenuous activity. You may usually resume normal activities the next day, or as advised by your caregiver.   Do not drink alcohol.   Do not take medicine that has not been prescribed by your caregiver.   Do not sign important papers or make important decisions as your judgement may be impaired.   You may resume a normal diet as directed.   Change bandages (dressings) as directed.   Only take over-the-counter or prescription medicines for pain, discomfort, or fever as directed by your caregiver.  If you have questions or problems that seem related to the anesthetic, call the hospital and ask for the anesthetist, anesthesiologist, or anesthesia department. SEEK IMMEDIATE MEDICAL CARE IF:   You develop a rash.   You have difficulty breathing.   You have chest pain.   You have allergic problems.   You have uncontrolled nausea.   You have uncontrolled vomiting.   You develop any serious bleeding, especially from the incision site.  Document Released: 06/12/2007 Document Revised: 02/22/2011 Document Reviewed: 07/06/2010 [facility] Patient Information 2012 [place], [place].  Dilation and Curettage or Vacuum Curettage Care After Refer to this sheet in the next few weeks. These instructions provide you with general information on caring for yourself after your procedure. Your caregiver may also give you more specific instructions. Your treatment has been planned according to current medical practices, but problems sometimes occur. Call your caregiver if you have any problems or questions after your procedure. HOME CARE INSTRUCTIONS  Do not drive for 24 hours.   Wait 1 week before returning to strenuous activities.   Take your temperature 2 times a day for 4 days and write it down. Provide these temperatures to your caregiver if you develop a fever.   Avoid long periods of standing, and do no heavy lifting (more than 10  pounds or 4.5 kg), pushing, or pulling.   Limit stair climbing to once or twice a day.   Take rest periods often.   You may resume your usual diet.   Drink enough fluids to keep your urine clear or pale yellow.   You should return to your usual bowel function. If constipation should occur, you may:   Take a mild laxative with permission from your caregiver.   Add fruit and bran to your diet.   Drink more fluids.   Take showers instead of baths until your caregiver gives you permission to take baths.   Do not go swimming or use a hot tub until your caregiver gives you permission.   Try to have someone with you or available to you the first 24 to 48 hours, especially if you had a general anesthetic.   Do not douche, use tampons, or have intercourse until after your follow-up appointment, or when your caregiver approves.   Only take over-the-counter or prescription medicines for pain, discomfort, or fever as directed by your caregiver. Do not take aspirin. It can cause bleeding.   If a prescription was given, follow your caregiver's directions.   Keep all your follow-up appointments recommended by your caregiver.  SEEK MEDICAL CARE IF:   You have increasing cramps or pain not relieved with medicine.   You have abdominal pain which does not seem to be related to the same area of earlier cramping and pain.   You have bad smelling vaginal discharge.   You have a rash.   You have problems with any medicine.  SEEK IMMEDIATE MEDICAL CARE IF:   You have bleeding that is heavier than a normal menstrual period.   You have a fever.   You have chest pain.   You have shortness of breath.   You feel dizzy or feel like fainting.   You pass out.   You have pain in your shoulder strap area.   You have heavy vaginal bleeding with or without blood clots.  MAKE SURE YOU:   Understand these instructions.   Will watch your condition.   Will get help right away if you are not  doing well or get worse.  Document Released: 03/02/2000 Document Revised: 02/22/2011 Document Reviewed: 09/30/2008 [facility] Patient Information 2012 [place], [place].  No advil , aleve, or other nsaid medicines until 3 pm today.  Tylenol ok.

## 2011-06-22 NOTE — Anesthesia Procedure Notes (Signed)
Procedure Name: LMA Insertion Date/Time: 06/22/2011 7:31 AM Performed by: Renella Cunas D Pre-anesthesia Checklist: Patient identified, Emergency Drugs available, Suction available and Patient being monitored Patient Re-evaluated:Patient Re-evaluated prior to inductionOxygen Delivery Method: Circle System Utilized Preoxygenation: Pre-oxygenation with 100% oxygen Intubation Type: IV induction Ventilation: Mask ventilation without difficulty LMA: LMA inserted LMA Size: 4.0 Number of attempts: 1 Airway Equipment and Method: bite block Placement Confirmation: positive ETCO2 Tube secured with: Tape Dental Injury: Teeth and Oropharynx as per pre-operative assessment

## 2011-06-22 NOTE — Anesthesia Postprocedure Evaluation (Signed)
Anesthesia Post Note  Patient: [name]  Procedure(s) Performed: Procedure(s) (LRB): DILATATION AND CURETTAGE (N/A)  Anesthesia type: General  Patient location: PACU  Post pain: Pain level controlled  Post assessment: Post-op Vital signs reviewed  Last Vitals:  Filed Vitals:   06/22/11 0800  BP:   Pulse: 89  Temp:   Resp: 14    Post vital signs: Reviewed  Level of consciousness: sedated  Complications: No apparent anesthesia complications

## 2011-06-22 NOTE — Consult Note (Signed)
Interval History: The patient returns at the request of Dr. Chevis Pretty for further management of vaginal bleeding. The patient says her bleeding is typified by spotting and not on every day. She has no other pelvic or gyn symptoms. Dr. Corwin Levins note says a recent endometrial stripe measured 2 cm. No biopsy has been done yet.  HPI: The patient has a long-standing history of abnormal bleeding. She has undergone several D&Cs and endometrial biopsies some of which have shown simple hyperplasia without atypia. For a time she was treated with Provera but on an inconsistent regimen. She underwent exploratory laparotomy in April 2009 for a planned hysterectomy to manage her bleeding. However extensive adhesions were encountered and the procedure was abandoned. In February 2011 she underwent hysteroscopy and D&C which revealed superficial fragments of weakly proliferative endometrium. No atypia hyperplasia or malignancy was identified. It is interesting to note that this was proliferative endometrium given the fact that the patient was supposed to have been taking Provera. Since 2011 she has not received any additional hormonal management.  Allergies   Allergen  Reactions   .  Latex      Causes blisters    Past Medical History   Diagnosis  Date   .  Postmenopausal bleeding    .  Fibroids    .  Diabetes mellitus    .  Diverticulitis    .  Endometrial hyperplasia     Past Surgical History   Procedure  Date   .  Hysteroscopy w/d&c  04/2009   .  Abdominal hysterectomy  06/2007   .  Cholecystectomy    .  Back surgery    .  Dilation and curettage of uterus    .  Exploratory laparotomy     Current Outpatient Prescriptions   Medication  Sig  Dispense  Refill   .  aspirin 81 MG tablet  Take 81 mg by mouth daily.     .  Cholecalciferol (VITAMIN D-3 PO)  Take by mouth daily.     .  Cyanocobalamin (VITAMIN B-12 CR PO)  Take 1 tablet by mouth daily. Unsure of dose     .  LISINOPRIL PO  Take 1 tablet by mouth daily.      .  metFORMIN (GLUCOPHAGE) 500 MG tablet  Take 1,000 mg by mouth daily.     Marland Kitchen  PRAVASTATIN SODIUM PO  Take 1 tablet by mouth daily.     .  Prenatal Multivit-Min-Fe-FA (PRENATAL 1 + IRON PO)  Take by mouth daily.     .  vitamin C (ASCORBIC ACID) 500 MG tablet  Take 500 mg by mouth daily.      History    Social History   .  Marital Status:  Married     Spouse Name:  N/A     Number of Children:  N/A   .  Years of Education:  N/A    Occupational History   .  Not on file.    Social History Main Topics   .  Smoking status:  Never Smoker   .  Smokeless tobacco:  Not on file   .  Alcohol Use:  No   .  Drug Use:  No   .  Sexually Active:     Other Topics  Concern   .  Not on file    Social History Narrative   .  No narrative on file    No family history on file.  Review of Systems: 10 point  review of systems negative except as noted above  Vitals: Blood pressure 168/70, pulse 88, temperature 97.8 F (36.6 C), temperature source Oral, resp. rate 20, height 5' 1.5" (1.562 m), weight 232 lb 14.4 oz (105.643 kg).  Physical Exam: In general patient is a healthy white female in no acute distress  HEENT is negative  Neck is supple without thyromegaly  There is no supraclavicular or inguinal adenopathy.  The abdomen is obese soft nontender no masses organomegaly ascites or hernias are noted.  Pelvic exam  EGBUS vagina bladder urethra are normal  Cervix is normal uterus is anterior normal shape size and consistency  Bimanual exam reveals no adnexal masses  Rectovaginal exam confirms. It's noted that the patient is not having any bleeding at the present time.  Procedure note:  Endometrial biopsies obtained a retrieving a modest amount of endometrial tissue.  Assessment/Plan: Continued abnormal uterine bleeding. With an endometrial stripe of 2 cm we've obtained an endometrial biopsy for reassessment.  Patient presents today for D&C.  Risks and Benefits have been reviewed and all questions  answered Diane Corpus, MD

## 2011-06-22 NOTE — Progress Notes (Signed)
Glasses returned to pt 

## 2011-06-22 NOTE — Op Note (Signed)
  Preoperative diagnosis: Postmenopausal bleeding, endometrial polyps, simple hyperplasia  Postoperative diagnosis same  Procedure: Dilatation and curettage of the uterus  Surgeon: Rande Brunt. Stanford Breed  Anesthesia: LMA  Estimated blood loss: Minimal  Surgical findings: Exam under anesthesia revealed a uterus which is in the mid plane. The uterus sounded approximately 8 cm. The cervix was enlarged without any evidence of lesions. At the time of curettage there is a considerable amount of endometrial tissue and polyps removed.  Procedure: The patient was brought to the operating room and after satisfactory attainment of general anesthesia was placed in the modified lithotomy position in [place] stirrups. Examination under anesthesia was performed. The vagina and vulva were prepped with Betadine and the patient was draped.  A weighted speculum was placed in the posterior vagina and a narrow Deaver elevated the bladder. The cervix was grasped with a single-tooth tenaculum. Uterus was sounded. The cervix was then dilated to a 23 Pratt dilator. Sharp endometrial curettage performed removing a considerable amount of endometrial tissue and polyps. Stone forceps were then introduced into the uterus and attempts were made to grasp any additional tissue. Following this, sharp curettage performed once more with no residual tissue being removed.  The single-tooth tenaculum was removed. The patient was awakened from anesthesia and taken to the recovery room in satisfactory condition. Sponge needle and instrument counts correct x2.

## 2011-06-22 NOTE — Transfer of Care (Signed)
Immediate Anesthesia Transfer of Care Note  Patient: [name]  Procedure(s) Performed: Procedure(s) (LRB): DILATATION AND CURETTAGE (N/A)  Patient Location: PACU  Anesthesia Type: General  Level of Consciousness: awake, oriented, sedated and patient cooperative  Airway & Oxygen Therapy: Patient Spontanous Breathing and Patient connected to face mask oxygen  Post-op Assessment: Report given to PACU RN and Post -op Vital signs reviewed and stable  Post vital signs: Reviewed and stable  Complications: No apparent anesthesia complications

## 2011-06-22 NOTE — Anesthesia Preprocedure Evaluation (Addendum)
Anesthesia Evaluation  Patient identified by MRN, date of birth, ID band Patient awake    Reviewed: Allergy & Precautions, H&P , NPO status , Patient's Chart, lab work & pertinent test results  Airway Mallampati: II TM Distance: <3 FB Neck ROM: Full    Dental No notable dental hx. (+) Teeth Intact and Dental Advisory Given   Pulmonary asthma , sleep apnea and Continuous Positive Airway Pressure Ventilation ,  breath sounds clear to auscultation  Pulmonary exam normal       Cardiovascular hypertension, Pt. on medications negative cardio ROS  Rhythm:Regular Rate:Normal     Neuro/Psych negative neurological ROS  negative psych ROS   GI/Hepatic negative GI ROS, Neg liver ROS,   Endo/Other  Diabetes mellitus-, Well Controlled, Type 2, Oral Hypoglycemic AgentsMorbid obesity  Renal/GU negative Renal ROS  negative genitourinary   Musculoskeletal negative musculoskeletal ROS (+)   Abdominal (+) + obese,   Peds negative pediatric ROS (+)  Hematology negative hematology ROS (+)   Anesthesia Other Findings   Reproductive/Obstetrics negative OB ROS                          Anesthesia Physical Anesthesia Plan  ASA: III  Anesthesia Plan: General   Post-op Pain Management:    Induction: Intravenous  Airway Management Planned: LMA  Additional Equipment:   Intra-op Plan:   Post-operative Plan:   Informed Consent: I have reviewed the patients History and Physical, chart, labs and discussed the procedure including the risks, benefits and alternatives for the proposed anesthesia with the patient or authorized representative who has indicated his/her understanding and acceptance.   Dental advisory given  Plan Discussed with: CRNA  Anesthesia Plan Comments:         Anesthesia Quick Evaluation

## 2011-06-25 ENCOUNTER — Encounter (HOSPITAL_BASED_OUTPATIENT_CLINIC_OR_DEPARTMENT_OTHER): Payer: Self-pay | Admitting: Gynecology

## 2011-06-27 ENCOUNTER — Telehealth: Payer: Self-pay | Admitting: *Deleted

## 2011-06-27 NOTE — Telephone Encounter (Signed)
Patient notified of Path results.  F/U appt 07/30/11 0830

## 2011-07-23 ENCOUNTER — Ambulatory Visit: Payer: Medicare Other | Admitting: *Deleted

## 2011-07-30 ENCOUNTER — Encounter: Payer: Medicare Other | Attending: Family Medicine | Admitting: *Deleted

## 2011-07-30 ENCOUNTER — Encounter: Payer: Self-pay | Admitting: *Deleted

## 2011-07-30 ENCOUNTER — Ambulatory Visit: Payer: Medicare Other | Attending: Gynecology | Admitting: Gynecology

## 2011-07-30 ENCOUNTER — Encounter: Payer: Self-pay | Admitting: Gynecology

## 2011-07-30 VITALS — BP 158/98 | HR 82 | Temp 98.1°F | Resp 18 | Ht 61.5 in | Wt 238.6 lb

## 2011-07-30 DIAGNOSIS — E669 Obesity, unspecified: Secondary | ICD-10-CM | POA: Diagnosis not present

## 2011-07-30 DIAGNOSIS — Z79899 Other long term (current) drug therapy: Secondary | ICD-10-CM | POA: Insufficient documentation

## 2011-07-30 DIAGNOSIS — Z713 Dietary counseling and surveillance: Secondary | ICD-10-CM | POA: Diagnosis not present

## 2011-07-30 DIAGNOSIS — E119 Type 2 diabetes mellitus without complications: Secondary | ICD-10-CM | POA: Insufficient documentation

## 2011-07-30 DIAGNOSIS — G4733 Obstructive sleep apnea (adult) (pediatric): Secondary | ICD-10-CM | POA: Diagnosis not present

## 2011-07-30 DIAGNOSIS — N950 Postmenopausal bleeding: Secondary | ICD-10-CM | POA: Diagnosis not present

## 2011-07-30 DIAGNOSIS — I10 Essential (primary) hypertension: Secondary | ICD-10-CM | POA: Diagnosis not present

## 2011-07-30 NOTE — Patient Instructions (Signed)
Take Provera 10 mg a day.  Return to see me in 6 months

## 2011-07-30 NOTE — Progress Notes (Signed)
H&P   [name] 66 y.o. female  Chief Complaint  Patient presents with  . Postmenopausal Bleeding    Follow up    Interval History: The patient returns today for initial followup having undergone a D&C on 06/22/2011. Pathology showed disordered proliferative endometrium no with no evidence of hyperplasia. Patient's had a complicated postoperative course. She did have some spotting for about 2 weeks this had no bleeding since then.  Past Medical History  Diagnosis Date  . Postmenopausal bleeding   . Fibroids   . Diabetes mellitus   . Diverticulitis   . Endometrial hyperplasia   . Environmental allergies   . OSA on CPAP   . Hypertension   . Asthma with allergic rhinitis   . Otitis media of left ear 4 WKS AGO RIGHT EAR INFECTED AND FINISHED ANTIBIOTIC    NOW LEFT EAR PAIN --- PT STATES HAS CALL IN TO HER PCP TO GET ANOTHER ANTIBIOTIC  . White coat hypertension   . Osteoarthritis of hand BILATERAL HANDS  . Arthritis SHOULDERS, BACK   Past Surgical History  Procedure Date  . Hysteroscopy w/d&c X3 LAST ONE 05-07-1999  . Exploratory laparotomy 06-30-2007    ATTEMPTED HYSTERECTOMY ABORTED DUE TO EXTENSIVE ENDOMETRIOSIS W/ DENSE PELVIC ADHESIONS INVOLVING UTERUS AND LOWER RECTOSIGMOID  . Cholecystectomy 1995  . Back surgery 1997  . Dilation and curettage of uterus 2005  . Dilation and curettage of uterus 06/22/2011    Procedure: DILATATION AND CURETTAGE;  Surgeon: Jeannette Corpus, MD;  Location: [facility];  Service: Gynecology;  Laterality: N/A;  OK PER KEELA FOR 7:15 START   Current Outpatient Prescriptions  Medication Sig Dispense Refill  . ALBUTEROL IN Inhale into the lungs as needed.      Marland Kitchen aspirin 81 MG tablet Take 81 mg by mouth daily.      . Cholecalciferol (VITAMIN D-3 PO) Take by mouth daily.      . Cyanocobalamin (VITAMIN B-12 CR PO) Take 1 tablet by mouth daily. Unsure of dose      . lisinopril-hydrochlorothiazide (PRINZIDE,ZESTORETIC)  10-12.5 MG per tablet Take 1 tablet by mouth daily.  90 tablet  0  . metFORMIN (GLUCOPHAGE XR) 500 MG 24 hr tablet Take 1 tablet (500 mg total) by mouth 2 (two) times daily.  180 tablet  0  . Omega-3 Fatty Acids (FISH OIL) 1000 MG CAPS Take 1 capsule by mouth daily.      . pravastatin (PRAVACHOL) 40 MG tablet Take 1 tablet (40 mg total) by mouth every evening.  90 tablet  0  . vitamin C (ASCORBIC ACID) 500 MG tablet Take 500 mg by mouth daily.      . Zinc 50 MG TABS Take 1 tablet by mouth daily.      . fluticasone (FLONASE) 50 MCG/ACT nasal spray Place 1 spray into the nose daily.  16 g  6  . Prenatal Vit-Fe Fumarate-FA (PRENATAL MULTIVITAMIN) 60-1 MG tablet Take 1 tablet by mouth daily.  90 tablet  0  . DISCONTD: LISINOPRIL PO Take 1 tablet by mouth daily.       Allergies  Allergen Reactions  . Latex Itching and Other (See Comments)    Causes blisters   History   Social History  . Marital Status: Married    Spouse Name: N/A    Number of Children: N/A  . Years of Education: N/A   Occupational History  . Not on file.   Social History Main Topics  . Smoking  status: Never Smoker   . Smokeless tobacco: Not on file  . Alcohol Use: No  . Drug Use: No  . Sexually Active:    Other Topics Concern  . Not on file   Social History Narrative  . No narrative on file   History reviewed. No pertinent family history.  Pertinent Gynecological History:  ROS: 10 point review of systems negative except as noted above Vitals:  Blood pressure 158/98, pulse 82, temperature 98.1 F (36.7 C), resp. rate 18, height 5' 1.5" (1.562 m), weight 238 lb 9.6 oz (108.228 kg).  Physical Exam: In general this is a healthy overweight white female no acute distress  HEENT is negative  Neck supple without thyromegaly  Is no supraventricular or inguinal adenopathy.  The abdomen is obese soft nontender no masses, organomegaly, ascites or hernias are noted. Pelvic exam:  EGBUS is normal  Vagina is  normal no lesions are noted.  Cervix is normal uterus is anterior normal shape size consistency  Is no adnexal masses  Rectovaginal exam confirms    Assessment/Plan: Disordered proliferative endometrium secondary to on opposed estrogen (obesity)  Management options were discussed the patient we've agreed to proceed with initiating therapy using Provera 10 mg daily on a continuous basis. She is warned that if she skips doses she may have some spotting. She returned to see me in 6 months for routine followup or sooner if she has any difficulties  The patient is encouraged to lose weight and is undergoing a nutritional evaluation to improve her diet and diabetes management.   Jeannette Corpus, MD 07/30/2011, 8:41 AM

## 2011-07-30 NOTE — Patient Instructions (Signed)
Aim for 3 servings of carbohydrates at every meal +/- 1 carb choice Write down food intake, focusing on carb counts Continue to monitor blood glucose every morning and check between 1to 2-hr after 1 meal Keep log of blood sugars  Use food label to make balanced carb choices

## 2011-07-30 NOTE — Progress Notes (Signed)
Patient was seen on 07/30/11 for diabetes counseling.  She has been diagnosed for several years, but has not received diabetes education.  She monitors her GBS periodically and is obese  Current dietary intake: 3 meals and 2 snacks Breakfast: bacon or sausage, eggs, maybe biscuits and gravy with water.  May have coffee with sugar-free creamer Snack: sometimes has cookies or pie Lunch: salad or leftovers from dinner; sometimes will eat a sandwich on wheat bread; may also have soup. Drinks water.  If out and about, will stop at fast food for whopper, fries, and diet soda or chicken caesar salad with water Dinner: meat, starch, vegetable; sometimes pizza.  Prepares casseroles more often Eats fruits most, but not all, days.  Drinks skim milk, if at all PM snack: kettle corn or sugar-free ice cream  Medications: see list  Nutrition diagnosis: Food and nutrition-related knowledge deficit related to lack of prior nutrition/diabetes education, as evidenced by DM2 diagnoses.   Intervention:Nutrition counseling provided.  Discussed basics of diabetes and the role of weight on insulin resistance.  Discussed sources of carbohydrates and carbohydrate counting.  Also discussed food labels.   Progress towards goals: in progress  Handouts given:  Carb Counting and Food Label handouts  Meal Plan Card  Recommendations: 3 carb choices at every meal +/- 1 choice Snacks 0-15 g carb  Monitoring/Evaluation: Dietary intake, exercise, and body weight.  Will follow up in 6 weeks

## 2011-08-03 ENCOUNTER — Telehealth: Payer: Self-pay

## 2011-08-03 NOTE — Telephone Encounter (Signed)
Patient saw Dr. Neva Seat and Dr. Hal Hope. Dr. Neva Seat mentioned to the patient that Dr. Hal Hope had noted in her chart that she should have her blood sugar tested regularly but the patient doesn't remember how often she should do this. Please advise the patient.

## 2011-08-04 NOTE — Telephone Encounter (Signed)
LMOM TO RECHECK EVERY THREE MONTHS FOR DM CHECK.

## 2011-08-20 ENCOUNTER — Ambulatory Visit (INDEPENDENT_AMBULATORY_CARE_PROVIDER_SITE_OTHER): Payer: Medicare Other | Admitting: Family Medicine

## 2011-08-20 ENCOUNTER — Encounter: Payer: Self-pay | Admitting: Family Medicine

## 2011-08-20 VITALS — BP 136/80 | HR 96 | Temp 98.1°F | Resp 16 | Ht 62.0 in | Wt 233.0 lb

## 2011-08-20 DIAGNOSIS — B353 Tinea pedis: Secondary | ICD-10-CM

## 2011-08-20 DIAGNOSIS — E785 Hyperlipidemia, unspecified: Secondary | ICD-10-CM | POA: Diagnosis not present

## 2011-08-20 DIAGNOSIS — E559 Vitamin D deficiency, unspecified: Secondary | ICD-10-CM | POA: Diagnosis not present

## 2011-08-20 DIAGNOSIS — I10 Essential (primary) hypertension: Secondary | ICD-10-CM

## 2011-08-20 DIAGNOSIS — E119 Type 2 diabetes mellitus without complications: Secondary | ICD-10-CM

## 2011-08-20 LAB — LIPID PANEL
Cholesterol: 156 mg/dL (ref 0–200)
HDL: 37 mg/dL — ABNORMAL LOW (ref >39)
LDL Cholesterol: 102 mg/dL — ABNORMAL HIGH (ref 0–99)
Total CHOL/HDL Ratio: 4.2 Ratio
Triglycerides: 87 mg/dL (ref <150)
VLDL: 17 mg/dL (ref 0–40)

## 2011-08-20 LAB — POCT CBC
Granulocyte percent: 76.7 %G (ref 37–80)
HCT, POC: 37.7 % (ref 37.7–47.9)
Hemoglobin: 12.1 g/dL — AB (ref 12.2–16.2)
Lymph, poc: 1.7 (ref 0.6–3.4)
MCH, POC: 28.2 pg (ref 27–31.2)
MCHC: 32.1 g/dL (ref 31.8–35.4)
MCV: 87.9 fL (ref 80–97)
MID (cbc): 0.4 (ref 0–0.9)
MPV: 8.6 fL (ref 0–99.8)
POC Granulocyte: 6.9 (ref 2–6.9)
POC LYMPH PERCENT: 18.6 %L (ref 10–50)
POC MID %: 4.7 %M (ref 0–12)
Platelet Count, POC: 285 10*3/uL (ref 142–424)
RBC: 4.29 M/uL (ref 4.04–5.48)
RDW, POC: 13.6 %
WBC: 9 10*3/uL (ref 4.6–10.2)

## 2011-08-20 LAB — POCT GLYCOSYLATED HEMOGLOBIN (HGB A1C): Hemoglobin A1C: 7.2

## 2011-08-20 LAB — COMPREHENSIVE METABOLIC PANEL
ALT: 23 U/L (ref 0–35)
AST: 21 U/L (ref 0–37)
Albumin: 4.5 g/dL (ref 3.5–5.2)
Alkaline Phosphatase: 87 U/L (ref 39–117)
BUN: 15 mg/dL (ref 6–23)
CO2: 22 mEq/L (ref 19–32)
Calcium: 10.1 mg/dL (ref 8.4–10.5)
Chloride: 102 mEq/L (ref 96–112)
Creat: 0.74 mg/dL (ref 0.50–1.10)
Glucose, Bld: 158 mg/dL — ABNORMAL HIGH (ref 70–99)
Potassium: 4.1 mEq/L (ref 3.5–5.3)
Sodium: 135 mEq/L (ref 135–145)
Total Bilirubin: 0.9 mg/dL (ref 0.3–1.2)
Total Protein: 7.5 g/dL (ref 6.0–8.3)

## 2011-08-20 LAB — TSH: TSH: 1.051 u[IU]/mL (ref 0.350–4.500)

## 2011-08-20 MED ORDER — PRAVASTATIN SODIUM 40 MG PO TABS: 40.0000 mg | ORAL_TABLET | Freq: Every evening | ORAL | Status: DC

## 2011-08-20 MED ORDER — METFORMIN HCL ER 500 MG PO TB24: 500.0000 mg | ORAL_TABLET | Freq: Two times a day (BID) | ORAL | Status: DC

## 2011-08-20 MED ORDER — KETOCONAZOLE 2 % EX CREA: TOPICAL_CREAM | CUTANEOUS | Status: DC

## 2011-08-20 MED ORDER — LISINOPRIL-HYDROCHLOROTHIAZIDE 10-12.5 MG PO TABS: 1.0000 | ORAL_TABLET | Freq: Every day | ORAL | Status: DC

## 2011-08-20 NOTE — Patient Instructions (Signed)

## 2011-08-20 NOTE — Progress Notes (Signed)
Addended by: Bronson Curb on: 08/20/2011 10:58 AM   Modules accepted: Orders

## 2011-08-20 NOTE — Progress Notes (Signed)
Is a 66 year old woman with diabetes for less than 10 years. She also has hypertension and hyperlipidemia. She's had no recent problems and is here just for lab work.  Her blood sugars have been running anywhere from 100 3275 daily.  She notes no chest pain, shortness of breath, abdominal pain, new skin lesions.  She sees an eye doctor every year for her retinal check, had a colonoscopy in 2009 and is due next year, gets regular mammograms.  Objective: No acute distress, obese woman HEENT: Normal fundi, oropharynx, TMs Neck: Supple no adenopathy Chest: Clear Heart: Regular no murmur or gallop Abdomen: Soft nontender-protuberant with diastases recti-no HSM Extremities: Normal sensation of both feet with good dorsalis pedal pulses, tinea pedis between the fourth and fifth toes of both sides with white exudate  Assessment: Stable with blood care improvement needed  Plan: Refill meds, add ketoconazole between toes weakly. 1. Type 2 diabetes mellitus  POCT CBC, Comprehensive metabolic panel, Lipid panel, TSH, Microalbumin, urine, metFORMIN (GLUCOPHAGE XR) 500 MG 24 hr tablet  2. Tinea pedis  ketoconazole (NIZORAL) 2 % cream  3. Vitamin d deficiency  Vitamin D, 25-hydroxy  4. Hypertension  lisinopril-hydrochlorothiazide (PRINZIDE,ZESTORETIC) 10-12.5 MG per tablet  5. Hyperlipidemia  pravastatin (PRAVACHOL) 40 MG tablet   Health Maintenance issues reviewed and updated.

## 2011-08-21 LAB — VITAMIN D 25 HYDROXY (VIT D DEFICIENCY, FRACTURES): Vit D, 25-Hydroxy: 43 ng/mL (ref 30–89)

## 2011-08-21 LAB — MICROALBUMIN, URINE: Microalb, Ur: 4.59 mg/dL — ABNORMAL HIGH (ref 0.00–1.89)

## 2011-08-27 ENCOUNTER — Encounter: Payer: Medicare Other | Attending: Family Medicine | Admitting: *Deleted

## 2011-08-27 ENCOUNTER — Encounter: Payer: Self-pay | Admitting: *Deleted

## 2011-08-27 DIAGNOSIS — I119 Hypertensive heart disease without heart failure: Secondary | ICD-10-CM | POA: Diagnosis not present

## 2011-08-27 DIAGNOSIS — E119 Type 2 diabetes mellitus without complications: Secondary | ICD-10-CM | POA: Diagnosis not present

## 2011-08-27 DIAGNOSIS — G4733 Obstructive sleep apnea (adult) (pediatric): Secondary | ICD-10-CM | POA: Diagnosis not present

## 2011-08-27 DIAGNOSIS — E1101 Type 2 diabetes mellitus with hyperosmolarity with coma: Secondary | ICD-10-CM | POA: Diagnosis not present

## 2011-08-27 DIAGNOSIS — Z713 Dietary counseling and surveillance: Secondary | ICD-10-CM | POA: Diagnosis not present

## 2011-08-27 NOTE — Progress Notes (Signed)
  Medical Nutrition Therapy:  Appt start time: 1000 end time:  1030. Patient is here for diabetes follow-up appointment  Assessment:  Primary concerns today: diabetes.   MEDICATIONS: see list   DIETARY INTAKE:  Usual eating pattern includes 3 meals and 1-2 snacks per day.  24-hr recall:  B ( AM): coissant sandwich; boiled eggs, [place] bacon, sometimes 1 slice wheat toast; coffee with sugar-free creamer  Snk ( AM): rarely.    L ( PM): tossed salad or leftover from dinner; if out, will eat 1/2 whopper with fries diet pepsi Snk oranges D (PM) chicken with vegetables and 1/2 baked potato; sometimes spaghetti; sometimes popcorn shrimp from [employer] Snk ( PM): rarely; may have ice cream Beverages: water  Usual physical activity: none  Estimated energy needs: 1500 calories 170 g carbohydrates 112 g protein 42 g fat  Progress Towards Goal(s):  Some progress.   Nutritional Diagnosis:  NB-1.6 Limited adherence to nutrition-related recommendations As related to recent illness and increased demands from family.  As evidenced by consistantly elevated blood sugars.    Intervention:  Nutrition counseling provided.  Patient has made some changes in dietary choices and I applauded her for those changes.  She has cut back on fast food and has tried to be more conscious of her carbohydrate choices.  She has been under some stress and has not adhered to the nutrition advice as well as she would have liked.  Her fasting GBS is elevated daily.  She's been bleeding a lot and lethargic.  Encouraged more iron-rich and protein -rich foods.  Reemphasized importance of carb counting, encouraged more non-starchy vegetables.  Discussed chair exercises.  Handouts given during visit include:  Chair exercises  Goals: Follow Diabetes Meal Plan as instructed Eat 3 meals and 2 snacks, every 3-5 hrs Limit carbohydrate intake to 30 grams carbohydrate/meal Limit carbohydrate intake to 15 grams  carbohydrate/snack Add lean protein foods to meals/snacks Monitor glucose levels as instructed by your doctor Aim for 30 mins of physical activity daily Bring food record and glucose log to your next nutrition visit Increase iron-rich foods: leafy green vegetables, liver, dried beans, (count as carb choice). Take vitamin C supplement with these foods  Monitoring/Evaluation:  Dietary intake, exercise, GBS, and body weight in 2 month(s).

## 2011-08-27 NOTE — Patient Instructions (Addendum)
Goals:  Follow Diabetes Meal Plan as instructed  Eat 3 meals and 2 snacks, every 3-5 hrs  Limit carbohydrate intake to 30 grams carbohydrate/meal  Limit carbohydrate intake to 15 grams carbohydrate/snack  Add lean protein foods to meals/snacks  Monitor glucose levels as instructed by your doctor  Aim for 30 mins of physical activity daily  Bring food record and glucose log to your next nutrition visit  Increase iron-rich foods: leafy green vegetables, liver, dried beans, (count as carb choice). Take vitamin C supplement with these foods

## 2011-09-10 ENCOUNTER — Ambulatory Visit: Payer: Medicare Other | Admitting: *Deleted

## 2011-11-20 ENCOUNTER — Ambulatory Visit: Payer: Medicare Other | Admitting: *Deleted

## 2011-11-28 ENCOUNTER — Encounter: Payer: Medicare Other | Attending: Family Medicine | Admitting: *Deleted

## 2011-11-28 ENCOUNTER — Encounter: Payer: Self-pay | Admitting: *Deleted

## 2011-11-28 VITALS — Ht 61.5 in | Wt 193.8 lb

## 2011-11-28 DIAGNOSIS — E119 Type 2 diabetes mellitus without complications: Secondary | ICD-10-CM | POA: Insufficient documentation

## 2011-11-28 DIAGNOSIS — Z713 Dietary counseling and surveillance: Secondary | ICD-10-CM | POA: Insufficient documentation

## 2011-11-28 NOTE — Patient Instructions (Addendum)
Goals:  Follow Diabetes Meal Plan as instructed  Eat 3 meals and 2 snacks, every 3-5 hrs  Limit carbohydrate intake to 30-45 grams carbohydrate/meal  Limit carbohydrate intake to 15 grams carbohydrate/snack  Add lean protein foods to meals/snacks  Monitor glucose levels as instructed by your doctor  Aim for 30 mins of physical activity daily  Bring food record and glucose log to your next nutrition visit 

## 2011-11-28 NOTE — Progress Notes (Signed)
  Medical Nutrition Therapy:  Appt start time: 0800 end time:  0830.   Assessment:  Primary concerns today: diabetes follow up.   MEDICATIONS: see list   DIETARY INTAKE:  Usual eating pattern includes 3 meals and 2 snacks per day.  Everyday foods include starches, proteins, fruits, vegetables.  Avoided foods include concentrated sweets.    24-hr recall:  B ( AM): biscuit [place] 1 x; eggs, sasusage or bacon.  May eat whole wheat toast Snack: sometimes has cookies or pie  Lunch: salad or leftovers from dinner; sometimes will eat a sandwich on wheat bread; may also have soup. Drinks water.  Dinner: meat, starch, vegetable; sometimes pizza. Prepares casseroles more often  Eats fruits most, but not all, days. Drinks skim milk, if at all  PM snack: kettle corn or sugar-free ice cream Eats similar types of foods, but much smaller portions.  Will eat 1/2 amount of food as before!  Usual physical activity: walks daily 1-3 miles  Estimated energy needs: 1400 calories 158 g carbohydrates 105 g protein 39 g fat  Progress Towards Goal(s):  Some progress.   Nutritional Diagnosis:  NI-51.3 Inappropriate intake of food fats (saturated and trans fats) related to intake of fast foods and fried foods.  As evidenced by obesity and dyslipidemia.    Intervention:  Nutrition counseling provided.  [name] is here with significant weight loss.  She has increased her activity and has decreased her portions dramatically.  She still eats similar types of foods, but way less.  She is also monitoring her glucose regularly and her fasting values are always in target range.  She has made significant progress towards better diabetes management.  Encouraged reduction in dietary fats and a switch to heart healthy fats.  Spent some time answering questions: why would fasting glucose be higher if took 2 metformin at night (discussed darn phenomenon), why do I need to get HgA1C checked q3 months, why does my doctor  check my feet: etc.  Discussed general diabetes self-care: dental exams, eye exams, medical exams, etc.  Applauded her for her progress!!  Handouts given during visit include:  Sick days magnet  Monitoring/Evaluation:  Dietary intake, exercise, HgA1C, and body weight in 3 month(s).

## 2011-12-28 ENCOUNTER — Ambulatory Visit (INDEPENDENT_AMBULATORY_CARE_PROVIDER_SITE_OTHER): Payer: Medicare Other | Admitting: Family Medicine

## 2011-12-28 VITALS — BP 130/72 | HR 85 | Temp 98.7°F | Resp 18 | Ht 62.0 in | Wt 189.0 lb

## 2011-12-28 DIAGNOSIS — Z23 Encounter for immunization: Secondary | ICD-10-CM | POA: Diagnosis not present

## 2011-12-28 DIAGNOSIS — I10 Essential (primary) hypertension: Secondary | ICD-10-CM | POA: Diagnosis not present

## 2011-12-28 DIAGNOSIS — E785 Hyperlipidemia, unspecified: Secondary | ICD-10-CM | POA: Diagnosis not present

## 2011-12-28 DIAGNOSIS — E119 Type 2 diabetes mellitus without complications: Secondary | ICD-10-CM | POA: Insufficient documentation

## 2011-12-28 DIAGNOSIS — K5900 Constipation, unspecified: Secondary | ICD-10-CM

## 2011-12-28 DIAGNOSIS — R634 Abnormal weight loss: Secondary | ICD-10-CM

## 2011-12-28 LAB — COMPREHENSIVE METABOLIC PANEL
AST: 13 U/L (ref 0–37)
Albumin: 4.3 g/dL (ref 3.5–5.2)
Alkaline Phosphatase: 96 U/L (ref 39–117)
Chloride: 100 mEq/L (ref 96–112)
Potassium: 4.2 mEq/L (ref 3.5–5.3)
Sodium: 140 mEq/L (ref 135–145)
Total Protein: 7.1 g/dL (ref 6.0–8.3)

## 2011-12-28 LAB — LIPID PANEL: LDL Cholesterol: 124 mg/dL — ABNORMAL HIGH (ref 0–99)

## 2011-12-28 NOTE — Progress Notes (Signed)
Subjective: 66 year old lady with history of diabetes hypertension and hypercholesterolemia. She is here for regular 3 month visit. She has really been trying to make some lifestyle changes. She has lost about 50 pounds since May. She is working with a diabetic nutritionist and is getting regular exercise. She is scheduled to get her eye exam next week. She generally feels well. She takes birth of her medicines regularly though she's done some cutting back. Her blood pressure cause or lightheadedness when she is at the beach and her systolic was down to the 80s she denies chest pains or palpitations or breathing problems. Her stomach is good except for some constipation.  Objective: Pleasant alert lady in no acute distress. Neck supple without nodes thyromegaly. Chest is clear to auscultation. Heart regular without murmurs Dr. in this. Abdomen soft without organomegaly mass or tenderness. No ankle edema. Sensation in feet is good using the fiber tests.  Assessment: Well-controlled type 2 diabetes mellitus Hypertension, controlled Hyperlipidemia, controlled Weight loss Constipation  Plan: Continue to get plenty of fiber in her diet, drink lots of water, and walk regularly. If still constipated she can use something like some MiraLax or as needed basis.  Check lab  Results for orders placed in visit on 12/28/11  POCT GLYCOSYLATED HEMOGLOBIN (HGB A1C)      Component Value Range   Hemoglobin A1C 5.7     She has done excellently and losing weight. Advise her to decrease the lisinopril and the pravastatin to one half pill each daily. I think she can safely come off the metformin. She is to continue sticking with her diet and exercise. If numbers remain excellent, next visit she might be able to get down to just taking a tiny dose of lisinopril and taking her aspirin and nothing else.  We'll give flu shot today

## 2011-12-28 NOTE — Patient Instructions (Addendum)
Reduce the pravastatin and lisinopril to one half tablet each daily. Discontinue the metformin. Return 3 months. Continue exercise and weight loss.  Flu shot today

## 2011-12-29 ENCOUNTER — Encounter: Payer: Self-pay | Admitting: Family Medicine

## 2011-12-31 DIAGNOSIS — E119 Type 2 diabetes mellitus without complications: Secondary | ICD-10-CM | POA: Diagnosis not present

## 2011-12-31 DIAGNOSIS — H40059 Ocular hypertension, unspecified eye: Secondary | ICD-10-CM | POA: Diagnosis not present

## 2011-12-31 DIAGNOSIS — H2510 Age-related nuclear cataract, unspecified eye: Secondary | ICD-10-CM | POA: Diagnosis not present

## 2012-02-01 DIAGNOSIS — Z1231 Encounter for screening mammogram for malignant neoplasm of breast: Secondary | ICD-10-CM | POA: Diagnosis not present

## 2012-02-11 ENCOUNTER — Ambulatory Visit: Payer: Medicare Other | Attending: Gynecology | Admitting: Gynecology

## 2012-02-11 ENCOUNTER — Encounter: Payer: Self-pay | Admitting: Gynecology

## 2012-02-11 VITALS — BP 124/58 | HR 68 | Temp 97.8°F | Resp 16 | Ht 61.5 in | Wt 178.5 lb

## 2012-02-11 DIAGNOSIS — N950 Postmenopausal bleeding: Secondary | ICD-10-CM | POA: Diagnosis not present

## 2012-02-11 DIAGNOSIS — N8500 Endometrial hyperplasia, unspecified: Secondary | ICD-10-CM | POA: Diagnosis not present

## 2012-02-11 DIAGNOSIS — Z79899 Other long term (current) drug therapy: Secondary | ICD-10-CM | POA: Insufficient documentation

## 2012-02-11 DIAGNOSIS — R920 Mammographic microcalcification found on diagnostic imaging of breast: Secondary | ICD-10-CM | POA: Diagnosis not present

## 2012-02-11 NOTE — Progress Notes (Signed)
Consult Note: Gyn-Onc   [name] 66 y.o. female  Chief Complaint  Patient presents with  . Postmenopausal bleeding    Follow up    Interval History: The patient was last seen in May of 2013. She returns today as previously scheduled for followup. She's been taking Provera 10 mg daily since her last visit. Initially she did have some bleeding but in the last 3 months has had no further bleeding. She's tolerating the Provera well. She denies any other GI or GU symptoms. She has no pelvic pain or pressure.  HPI:The patient has a long-standing history of abnormal bleeding. She has undergone several D&Cs and endometrial biopsies some of which have shown simple hyperplasia without atypia. For a time she was treated with Provera but on an inconsistent regimen. She underwent exploratory laparotomy in April 2009 for a planned hysterectomy to manage her bleeding. However extensive adhesions were encountered and the procedure was abandoned. In February 2011 she underwent hysteroscopy and D&C which revealed superficial fragments of weakly proliferative endometrium. No atypia hyperplasia or malignancy was identified. It is interesting to note that this was proliferative endometrium given the fact that the patient was supposed to have been taking Provera. Since 2011 she has not received any additional hormonal management. She underwent a D&C on 06/22/2011. Final pathology showed disordered proliferative endometrium with no evidence of hyperplasia. The patient was placed on Provera 10 mg daily.  Review of Systems:10 point review of systems is negative as noted above.   Vitals: Blood pressure 124/58, pulse 68, temperature 97.8 F (36.6 C), temperature source Oral, resp. rate 16, height 5' 1.5" (1.562 m), weight 178 lb 8 oz (80.967 kg).  Physical Exam: General : The patient is a healthy woman in no acute distress.  HEENT: normocephalic, extraoccular movements normal; neck is supple without thyromegally   Lynphnodes: Supraclavicular and inguinal nodes not enlarged  Abdomen: Soft, non-tender, no ascites, no organomegally, no masses, no hernias  Pelvic:  EGBUS: Normal female  Vagina: Normal, no lesions  Urethra and Bladder: Normal, non-tender  Cervix: normal  Uterus: Anterior normal shape and size Bi-manual examination: Non-tender; no adenxal masses or nodularity  Rectal: normal sphincter tone, no masses, no blood  Lower extremities: No edema or varicosities. Normal range of motion    Assessment/Plan:  Abnormal bleeding now controlled with provera.  Continue provera 10 mg daily.  RTC 6 months or prn bleeding  Allergies  Allergen Reactions  . Latex Itching and Other (See Comments)    Causes blisters    Past Medical History  Diagnosis Date  . Postmenopausal bleeding   . Fibroids   . Diabetes mellitus   . Diverticulitis   . Endometrial hyperplasia   . Environmental allergies   . OSA on CPAP   . Hypertension   . Asthma with allergic rhinitis   . Otitis media of left ear 4 WKS AGO RIGHT EAR INFECTED AND FINISHED ANTIBIOTIC    NOW LEFT EAR PAIN --- PT STATES HAS CALL IN TO HER PCP TO GET ANOTHER ANTIBIOTIC  . White coat hypertension   . Osteoarthritis of hand BILATERAL HANDS  . Arthritis SHOULDERS, BACK  . Hyperlipidemia   . Obesity   . Sleep apnea     Past Surgical History  Procedure Date  . Hysteroscopy w/d&c X3 LAST ONE 05-07-1999  . Exploratory laparotomy 06-30-2007    ATTEMPTED HYSTERECTOMY ABORTED DUE TO EXTENSIVE ENDOMETRIOSIS W/ DENSE PELVIC ADHESIONS INVOLVING UTERUS AND LOWER RECTOSIGMOID  . Cholecystectomy 1995  .  Back surgery 1997  . Dilation and curettage of uterus 2005  . Dilation and curettage of uterus 06/22/2011    Procedure: DILATATION AND CURETTAGE;  Surgeon: Jeannette Corpus, MD;  Location: [facility];  Service: Gynecology;  Laterality: N/A;  OK PER KEELA FOR 7:15 START  . Back surgery 1997  . Dilation and curettage of uterus 2013     Current Outpatient Prescriptions  Medication Sig Dispense Refill  . aspirin 81 MG tablet Take 81 mg by mouth daily.      . Cholecalciferol (VITAMIN D-3 PO) Take by mouth daily.      . Cyanocobalamin (VITAMIN B-12 CR PO) Take 1 tablet by mouth daily. Unsure of dose      . lisinopril-hydrochlorothiazide (PRINZIDE,ZESTORETIC) 10-12.5 MG per tablet Take 0.5 tablets by mouth daily.      . medroxyPROGESTERone (PROVERA) 10 MG tablet Take 10 mg by mouth daily.      . Omega-3 Fatty Acids (FISH OIL) 1000 MG CAPS Take 1 capsule by mouth daily.      . pravastatin (PRAVACHOL) 40 MG tablet Take 20 mg by mouth every evening.      . vitamin C (ASCORBIC ACID) 500 MG tablet Take 500 mg by mouth daily.      . Zinc 50 MG TABS Take 1 tablet by mouth daily.      . [DISCONTINUED] lisinopril-hydrochlorothiazide (PRINZIDE,ZESTORETIC) 10-12.5 MG per tablet Take 1 tablet by mouth daily.  90 tablet  3  . [DISCONTINUED] pravastatin (PRAVACHOL) 40 MG tablet Take 1 tablet (40 mg total) by mouth every evening.  90 tablet  3  . ALBUTEROL IN Inhale into the lungs as needed.      . fluticasone (FLONASE) 50 MCG/ACT nasal spray Place 1 spray into the nose daily.  16 g  6  . loratadine (CLARITIN) 10 MG tablet Take 10 mg by mouth as needed.      . metFORMIN (GLUCOPHAGE XR) 500 MG 24 hr tablet Take 1 tablet (500 mg total) by mouth 2 (two) times daily.  180 tablet  3  . Prenatal Vit-Fe Fumarate-FA (PRENATAL MULTIVITAMIN) 60-1 MG tablet Take 1 tablet by mouth daily.  90 tablet  0  . [DISCONTINUED] LISINOPRIL PO Take 1 tablet by mouth daily.        History   Social History  . Marital Status: Married    Spouse Name: N/A    Number of Children: N/A  . Years of Education: N/A   Occupational History  . Not on file.   Social History Main Topics  . Smoking status: Never Smoker   . Smokeless tobacco: Not on file  . Alcohol Use: No  . Drug Use: No  . Sexually Active:    Other Topics Concern  . Not on file   Social  History Narrative  . No narrative on file    Family History  Problem Relation Age of Onset  . Hypertension Other       CLARKE-PEARSON,Noora Locascio L, MD 02/11/2012, 9:16 AM

## 2012-02-11 NOTE — Patient Instructions (Signed)
Please contact us if you have any further abnormal bleeding. Continue taking Provera 10 mg daily. Return to see [place] in 6 months.

## 2012-02-27 ENCOUNTER — Encounter: Payer: Medicare Other | Attending: Family Medicine | Admitting: *Deleted

## 2012-02-27 ENCOUNTER — Encounter: Payer: Self-pay | Admitting: *Deleted

## 2012-02-27 VITALS — Ht 61.5 in | Wt 176.0 lb

## 2012-02-27 DIAGNOSIS — Z713 Dietary counseling and surveillance: Secondary | ICD-10-CM | POA: Insufficient documentation

## 2012-02-27 DIAGNOSIS — E119 Type 2 diabetes mellitus without complications: Secondary | ICD-10-CM | POA: Diagnosis not present

## 2012-02-27 NOTE — Progress Notes (Signed)
  Medical Nutrition Therapy:  Appt start time: 0800 end time:  0830.   Assessment:  Primary concerns today: diabetes follow up.   MEDICATIONS: per MD orders, [name] has discontinued her metformin!  She also is able to cut her antihypertensive medication in half.     DIETARY INTAKE:  Usual eating pattern includes 3 meals and 0-1 snacks per day.  Everyday foods include some fruits and vegetables, starches.  Avoided foods include is picky with proteins, doesn't eat hardly any meat.    24-hr recall:  B ( AM): 30 g carb  Snk ( AM): none  L ( PM): 30 g carb Snk ( PM): none usually D ( PM): 30 g carb.  Sometimes vegetable and protein Snk ( PM): none Beverages: water and coffee in  morning  Usual physical activity: chair exercises every day  Estimated energy needs: 1200 calories 135 g carbohydrates 90 g protein 33 g fat  Progress Towards Goal(s):  Resolved.   Nutritional Diagnosis:  NB-1.7 Undesireable food choices As related to limited protein and non-starchy vegetables.  As evidenced by dietary recall.    Intervention:  Nutrition counseling provided.  [name] is here for a follow up related to her diabetes.  She has lost over 60 pounds since her initial nutrition visit in May!  Her HbA1c has dropped to 5.7% and she's been taken off metformin.  She monitors her blood glucose daily and her values are always in target range.  She is physically active daily and is in much better health.  I encouraged her to increase her non-starchy vegetables and lean proteins from non-meat sources like cheese, nuts, peanut butter, and eggs.  [name] agreed.    Monitoring/Evaluation:  Dietary intake, exercise, BGM, and body weight prn.  Will follow up, if needed

## 2012-04-21 ENCOUNTER — Encounter: Payer: Self-pay | Admitting: Family Medicine

## 2012-04-21 ENCOUNTER — Ambulatory Visit (INDEPENDENT_AMBULATORY_CARE_PROVIDER_SITE_OTHER): Payer: Medicare Other | Admitting: Family Medicine

## 2012-04-21 VITALS — BP 134/67 | HR 100 | Temp 97.9°F | Resp 16 | Ht 62.0 in | Wt 175.0 lb

## 2012-04-21 DIAGNOSIS — I10 Essential (primary) hypertension: Secondary | ICD-10-CM | POA: Diagnosis not present

## 2012-04-21 DIAGNOSIS — Z9989 Dependence on other enabling machines and devices: Secondary | ICD-10-CM | POA: Insufficient documentation

## 2012-04-21 DIAGNOSIS — G4733 Obstructive sleep apnea (adult) (pediatric): Secondary | ICD-10-CM

## 2012-04-21 DIAGNOSIS — E7800 Pure hypercholesterolemia, unspecified: Secondary | ICD-10-CM

## 2012-04-21 DIAGNOSIS — E119 Type 2 diabetes mellitus without complications: Secondary | ICD-10-CM

## 2012-04-21 DIAGNOSIS — E785 Hyperlipidemia, unspecified: Secondary | ICD-10-CM

## 2012-04-21 DIAGNOSIS — N939 Abnormal uterine and vaginal bleeding, unspecified: Secondary | ICD-10-CM

## 2012-04-21 LAB — LIPID PANEL
Total CHOL/HDL Ratio: 3.9 Ratio
VLDL: 12 mg/dL (ref 0–40)

## 2012-04-21 LAB — HEMOGLOBIN A1C: Mean Plasma Glucose: 123 mg/dL — ABNORMAL HIGH (ref <117)

## 2012-04-21 LAB — CBC WITH DIFFERENTIAL/PLATELET
Basophils Absolute: 0 10*3/uL (ref 0.0–0.1)
HCT: 40.8 % (ref 36.0–46.0)
Hemoglobin: 14 g/dL (ref 12.0–15.0)
Lymphocytes Relative: 22 % (ref 12–46)
Lymphs Abs: 1.3 10*3/uL (ref 0.7–4.0)
Monocytes Absolute: 0.4 10*3/uL (ref 0.1–1.0)
Monocytes Relative: 7 % (ref 3–12)
Neutro Abs: 4.1 10*3/uL (ref 1.7–7.7)
RBC: 4.69 MIL/uL (ref 3.87–5.11)
WBC: 6.1 10*3/uL (ref 4.0–10.5)

## 2012-04-21 LAB — COMPREHENSIVE METABOLIC PANEL
Albumin: 4.4 g/dL (ref 3.5–5.2)
Alkaline Phosphatase: 97 U/L (ref 39–117)
Glucose, Bld: 97 mg/dL (ref 70–99)
Potassium: 3.9 mEq/L (ref 3.5–5.3)
Sodium: 137 mEq/L (ref 135–145)
Total Protein: 7.1 g/dL (ref 6.0–8.3)

## 2012-04-21 NOTE — Assessment & Plan Note (Signed)
Controlled with dietary modification and significant weight loss; obtain labs.

## 2012-04-21 NOTE — Patient Instructions (Addendum)
1. Type II or unspecified type diabetes mellitus without mention of complication, not stated as uncontrolled  Comprehensive metabolic panel, Hemoglobin A1c  2. Pure hypercholesterolemia  Lipid panel  3. Essential hypertension, benign  CBC with Differential, CK

## 2012-04-21 NOTE — Assessment & Plan Note (Signed)
Improved/resolved; last bleeding 09/2011 and oncology aware; s/p evaluation by gyn-onc in past six months.

## 2012-04-21 NOTE — Assessment & Plan Note (Signed)
Controlled; obtain labs; continue 1/2 Pravastatin 40mg  daily.

## 2012-04-21 NOTE — Assessment & Plan Note (Signed)
Controlled with low BP readings at home; advised to decrease Lisinopril/HCTZ to 1/4 tablet daily.  Obtain labs.  Goal systolic readings 100-120.

## 2012-04-21 NOTE — Assessment & Plan Note (Signed)
Controlled; significant weight loss in past two years; warrants repeat CPAP titration; to discuss with Dohmeier in 08/2012.

## 2012-04-21 NOTE — Progress Notes (Signed)
[address]   [place], [place]  [address]   [phone]  Subjective:    Patient ID: [name], female    DOB: 10-May-1945, 67 y.o.   MRN: [id number]  HPIThis 67 y.o. female presents for evaluation of the following:  1. Post-menopausal bleeding:  Onset age 18; hemorrhaged; lost from Hgb of 12.0 to 8.0.  Tried to undergo hysterectomy but unsuccessful.    Referred to gynecological oncology; followed every six months.  Pap smears and endometrial biopsies; s/p multiple D&Cs.  Hysteroscopies.  Last vaginal bleeding 09/2011 very light. Loree Fee.    2.  DMII:  Diagnosed 2012; Dr. Neva Seat scared pt; lost from 233-167.  Started losing weight; referred to diabetic nutritionist; excellent experience.  Started thinking about what she was eating.  Checking sugars every morning; fasting this morning 83.  Still trying to lose weight; exercises indoor performing daily.  Eye exam [place] 12/2011.  No Metformin now due to HgbA1c of 5.7.   S/p flu vaccine in 12/2011.    3.  HTN:  Checking blood pressure daily; running really low at home; has White Coat syndrome.  Walked from [employer] due to car trouble.  Last night 89/58; this morning 90/60.   Taking Lisinopril 10/12.5 1/2 daily.  Just renewed rx.  Has 90 day supply.  Intermittent dizziness.    4.  Hyperlipidemia: taking 1/2 Pravastatin 40mg  daily.  Reports good tolerance to medication, good compliance with medication, good symptom control.  5. OSA:  Compliance with CPAP.  Due to follow-up with Dohmeier in 08/2012.  Last sleep study with diagnosis; two years ago.       Review of Systems  Constitutional: Negative for fever, chills, diaphoresis and fatigue.  HENT: Positive for congestion and sneezing. Negative for rhinorrhea and postnasal drip.   Eyes: Negative for photophobia and visual disturbance.  Respiratory: Positive for cough. Negative for shortness of breath, wheezing and stridor.   Cardiovascular: Negative for chest pain, palpitations and  leg swelling.  Gastrointestinal: Negative for nausea, vomiting, abdominal pain and diarrhea.  Genitourinary: Negative for vaginal bleeding.  Neurological: Negative for dizziness, tremors, seizures, syncope, facial asymmetry, speech difficulty, weakness, light-headedness, numbness and headaches.    Past Medical History  Diagnosis Date  . Postmenopausal bleeding   . Fibroids   . Diabetes mellitus   . Diverticulitis   . Endometrial hyperplasia   . Environmental allergies   . OSA on CPAP   . Hypertension   . Otitis media of left ear 4 WKS AGO RIGHT EAR INFECTED AND FINISHED ANTIBIOTIC    NOW LEFT EAR PAIN --- PT STATES HAS CALL IN TO HER PCP TO GET ANOTHER ANTIBIOTIC  . White coat hypertension   . Hyperlipidemia   . Obesity   . Sleep apnea   . Osteoarthritis of hand BILATERAL HANDS  . Arthritis SHOULDERS, BACK  . Asthma with allergic rhinitis     local honey    Past Surgical History  Procedure Date  . Hysteroscopy w/d&c X3 LAST ONE 05-07-1999  . Exploratory laparotomy 06-30-2007    ATTEMPTED HYSTERECTOMY ABORTED DUE TO EXTENSIVE ENDOMETRIOSIS W/ DENSE PELVIC ADHESIONS INVOLVING UTERUS AND LOWER RECTOSIGMOID  . Cholecystectomy 1995  . Back surgery 1997  . Dilation and curettage of uterus 2005  . Dilation and curettage of uterus 06/22/2011    Procedure: DILATATION AND CURETTAGE;  Surgeon: Jeannette Corpus, MD;  Location: [facility];  Service: Gynecology;  Laterality: N/A;  OK PER KEELA FOR 7:15 START  .  Back surgery 1997  . Dilation and curettage of uterus 2013  . Colonoscopy w/ polypectomy 03/20/2007    colon polyps.      Prior to Admission medications   Medication Sig Start Date End Date Taking? Authorizing Provider  ALBUTEROL IN Inhale into the lungs as needed.   Yes Historical Provider, MD  aspirin 81 MG tablet Take 81 mg by mouth daily.   Yes Historical Provider, MD  Cholecalciferol (VITAMIN D-3 PO) Take by mouth daily.   Yes Historical Provider, MD    Cyanocobalamin (VITAMIN B-12 CR PO) Take 1 tablet by mouth daily. Unsure of dose   Yes Historical Provider, MD  lisinopril-hydrochlorothiazide (PRINZIDE,ZESTORETIC) 10-12.5 MG per tablet Take 0.5 tablets by mouth daily. 08/20/11 08/19/12 Yes Elvina Sidle, MD  medroxyPROGESTERone (PROVERA) 10 MG tablet Take 10 mg by mouth daily.   Yes Historical Provider, MD  Omega-3 Fatty Acids (FISH OIL) 1000 MG CAPS Take 1 capsule by mouth daily.   Yes Historical Provider, MD  pravastatin (PRAVACHOL) 40 MG tablet Take 20 mg by mouth every evening. 08/20/11 08/19/12 Yes Elvina Sidle, MD  vitamin C (ASCORBIC ACID) 500 MG tablet Take 500 mg by mouth daily.   Yes Historical Provider, MD  Zinc 50 MG TABS Take 1 tablet by mouth daily.   Yes Historical Provider, MD  fluticasone (FLONASE) 50 MCG/ACT nasal spray Place 1 spray into the nose daily. 06/14/11 06/13/12  Shade Flood, MD  loratadine (CLARITIN) 10 MG tablet Take 10 mg by mouth as needed.    Historical Provider, MD  metFORMIN (GLUCOPHAGE XR) 500 MG 24 hr tablet Take 1 tablet (500 mg total) by mouth 2 (two) times daily. 08/20/11 08/19/12  Elvina Sidle, MD  Prenatal Vit-Fe Fumarate-FA (PRENATAL MULTIVITAMIN) 60-1 MG tablet Take 1 tablet by mouth daily. 05/15/11 05/14/12  Sondra Barges, PA-C    Allergies  Allergen Reactions  . Latex Itching and Other (See Comments)    Causes blisters    History   Social History  . Marital Status: Married    Spouse Name: N/A    Number of Children: N/A  . Years of Education: N/A   Occupational History  . Not on file.   Social History Main Topics  . Smoking status: Never Smoker   . Smokeless tobacco: Not on file  . Alcohol Use: No  . Drug Use: No  . Sexually Active:    Other Topics Concern  . Not on file   Social History Narrative   Marital status: married x 47 years; happily married; no abuse.   Children: 4 children; six grandchildren.   Lives:  With husband.   Employed: retired in 2011; [employer].   Tobacco: none    Alcohol:  None   Drugs: none   Exercise:  Daily.    Family History  Problem Relation Age of Onset  . Hypertension Other   . Vision loss Mother   . Hypertension Mother   . Depression Father   . Heart disease Sister   . Hypertension Sister   . Diabetes Brother   . Hypertension Brother   . Cancer Brother     skin  . Hyperlipidemia Brother        Objective:   Physical Exam  Nursing note and vitals reviewed. Constitutional: She is oriented to person, place, and time. She appears well-developed and well-nourished. No distress.  HENT:  Head: Normocephalic and atraumatic.  Right Ear: External ear normal.  Left Ear: External ear normal.  Nose: Nose normal.  Mouth/Throat: Oropharynx is clear and moist.  Eyes: Conjunctivae normal are normal. Pupils are equal, round, and reactive to light.  Neck: Normal range of motion. Neck supple. No JVD present. No thyromegaly present.  Cardiovascular: Normal rate, regular rhythm, normal heart sounds and intact distal pulses.  Exam reveals no gallop and no friction rub.   No murmur heard. Pulmonary/Chest: Effort normal and breath sounds normal. She has no wheezes. She has no rales.  Lymphadenopathy:    She has no cervical adenopathy.  Neurological: She is alert and oriented to person, place, and time. No cranial nerve deficit. She exhibits normal muscle tone. Coordination normal.  Skin: Skin is warm and dry. No rash noted. She is not diaphoretic. No erythema.  Psychiatric: She has a normal mood and affect. Her behavior is normal. Judgment and thought content normal.      Assessment & Plan:   1. Type II or unspecified type diabetes mellitus without mention of complication, not stated as uncontrolled  Comprehensive metabolic panel, Hemoglobin A1c  2. Pure hypercholesterolemia  Lipid panel  3. Essential hypertension, benign  CBC with Differential, CK

## 2012-05-05 ENCOUNTER — Ambulatory Visit (INDEPENDENT_AMBULATORY_CARE_PROVIDER_SITE_OTHER): Payer: Medicare Other | Admitting: Emergency Medicine

## 2012-05-05 VITALS — BP 148/74 | HR 94 | Temp 97.8°F | Resp 18 | Ht 61.25 in | Wt 172.4 lb

## 2012-05-05 DIAGNOSIS — J209 Acute bronchitis, unspecified: Secondary | ICD-10-CM

## 2012-05-05 MED ORDER — FLUTICASONE PROPIONATE 50 MCG/ACT NA SUSP: 4.0000 | Freq: Every day | NASAL | Status: DC

## 2012-05-05 MED ORDER — AZITHROMYCIN 250 MG PO TABS: ORAL_TABLET | ORAL | Status: DC

## 2012-05-05 MED ORDER — BENZONATATE 200 MG PO CAPS: 200.0000 mg | ORAL_CAPSULE | Freq: Three times a day (TID) | ORAL | Status: DC | PRN

## 2012-05-05 NOTE — Progress Notes (Signed)
[facility] [address] [place] [address] [id number]  Date:  05/05/2012   Name:  [name]   DOB:  [birth year]   MRN:  [id number]  PCP:  [name], MD    Chief Complaint: Cough, Dizziness, Fever and Wheezing   History of Present Illness:  [name] is a 67 y.o. very pleasant female patient who presents with the following:  Ill since Wednesday with a non productive cough.  Had a fever Thursday.  None since.  No nausea or vomiting.  Has some wheezing today but no shortness of breath.  No nasal congestion or drainage.  No sore throat.  No nausea or vomiting.  No stool change.  No improvement with MDI or OTC medications.    Patient Active Problem List  Diagnosis  . Uterine bleeding  . Diabetes  . HTN (hypertension)  . Hyperlipidemia  . Obstructive sleep apnea on CPAP    Past Medical History  Diagnosis Date  . Postmenopausal bleeding   . Fibroids   . Diabetes mellitus   . Diverticulitis   . Endometrial hyperplasia   . Environmental allergies   . OSA on CPAP   . Hypertension   . White coat hypertension   . Hyperlipidemia   . Obesity   . Sleep apnea   . Osteoarthritis of hand BILATERAL HANDS  . Arthritis SHOULDERS, BACK  . Asthma with allergic rhinitis     local honey    Past Surgical History  Procedure Laterality Date  . Hysteroscopy w/d&c  X3 LAST ONE 05-07-1999  . Exploratory laparotomy  06-30-2007    ATTEMPTED HYSTERECTOMY ABORTED DUE TO EXTENSIVE ENDOMETRIOSIS W/ DENSE PELVIC ADHESIONS INVOLVING UTERUS AND LOWER RECTOSIGMOID  . Cholecystectomy  1995  . Back surgery  1997  . Dilation and curettage of uterus  2005  . Dilation and curettage of uterus  06/22/2011    Procedure: DILATATION AND CURETTAGE;  Surgeon: Jeannette Corpus, MD;  Location: [facility];  Service: Gynecology;  Laterality: N/A;  OK PER KEELA FOR 7:15 START  . Back surgery  1997  . Dilation and curettage of uterus  2013  .  Colonoscopy w/ polypectomy  03/20/2007    colon polyps.      History  Substance Use Topics  . Smoking status: Never Smoker   . Smokeless tobacco: Not on file  . Alcohol Use: No    Family History  Problem Relation Age of Onset  . Hypertension Other   . Vision loss Mother   . Hypertension Mother   . Depression Father   . Heart disease Sister   . Hypertension Sister   . Diabetes Brother   . Hypertension Brother   . Cancer Brother     skin  . Hyperlipidemia Brother     Allergies  Allergen Reactions  . Latex Itching and Other (See Comments)    Causes blisters    Medication list has been reviewed and updated.  Current Outpatient Prescriptions on File Prior to Visit  Medication Sig Dispense Refill  . ALBUTEROL IN Inhale into the lungs as needed.      Marland Kitchen aspirin 81 MG tablet Take 81 mg by mouth daily.      . Cholecalciferol (VITAMIN D-3 PO) Take by mouth daily.      . Cyanocobalamin (VITAMIN B-12 CR PO) Take 1 tablet by mouth daily. Unsure of dose      . lisinopril-hydrochlorothiazide (PRINZIDE,ZESTORETIC) 10-12.5 MG per tablet Take 0.5 tablets  by mouth daily.      Marland Kitchen loratadine (CLARITIN) 10 MG tablet Take 10 mg by mouth as needed.      . medroxyPROGESTERone (PROVERA) 10 MG tablet Take 5 mg by mouth 2 (two) times daily.       . Omega-3 Fatty Acids (FISH OIL) 1000 MG CAPS Take 1 capsule by mouth daily.      . pravastatin (PRAVACHOL) 40 MG tablet Take 20 mg by mouth every evening.      . vitamin C (ASCORBIC ACID) 500 MG tablet Take 500 mg by mouth daily.      . Zinc 50 MG TABS Take 1 tablet by mouth daily.      . fluticasone (FLONASE) 50 MCG/ACT nasal spray Place 1 spray into the nose daily.  16 g  6  . metFORMIN (GLUCOPHAGE XR) 500 MG 24 hr tablet Take 1 tablet (500 mg total) by mouth 2 (two) times daily.  180 tablet  3  . Prenatal Vit-Fe Fumarate-FA (PRENATAL MULTIVITAMIN) 60-1 MG tablet Take 1 tablet by mouth daily.  90 tablet  0  . [DISCONTINUED] LISINOPRIL PO Take 1 tablet  by mouth daily.       No current facility-administered medications on file prior to visit.    Review of Systems:  As per HPI, otherwise negative.    Physical Examination: Filed Vitals:   05/05/12 1646  BP: 148/74  Pulse: 94  Temp: 97.8 F (36.6 C)  Resp: 18   Filed Vitals:   05/05/12 1646  Height: 5' 1.25" (1.556 m)  Weight: 172 lb 6.4 oz (78.2 kg)   Body mass index is 32.3 kg/(m^2). Ideal Body Weight: Weight in (lb) to have BMI = 25: 133.1  GEN: WDWN, NAD, Non-toxic, A & O x 3 HEENT: Atraumatic, Normocephalic. Neck supple. No masses, No LAD. Ears and Nose: No external deformity. CV: RRR, No M/G/R. No JVD. No thrill. No extra heart sounds. PULM: CTA B, scattered wheezes, no crackles, rhonchi. No retractions. No resp. distress. No accessory muscle use. ABD: S, NT, ND, +BS. No rebound. No HSM. EXTR: No c/c/e NEURO Normal gait.  PSYCH: Normally interactive. Conversant. Not depressed or anxious appearing.  Calm demeanor.    Assessment and Plan: Bronchitis with bronchospasm zpak tussionex Use MDI q4h  Carmelina Dane, MD

## 2012-05-05 NOTE — Patient Instructions (Addendum)

## 2012-05-10 ENCOUNTER — Telehealth: Payer: Self-pay

## 2012-05-10 NOTE — Telephone Encounter (Signed)
PATIENT IS SICK WITH A COLD NO FEVER JUST COUGH. PATIENT WANTS TO KNOW IF THIS CAN BE REFILLED THE azithromycin (ZITHROMAX) 250 MG tablet CALL CELL IF YOU CALL AFTER 9:30AM HER HUSBANDS IN THE HOSPITAL GETTING SURGERY.  CELL:[phone]

## 2012-05-10 NOTE — Telephone Encounter (Signed)
Pt states that she still having a cough with rattling in her chest.  She is taking the tessalon but they are not working. They usually work for her but now they are not.  She cannot take cough syrups because she cannot keep them down (she has a history).  Is there anything you would or could recommend for her?  She said her son takes something called coldease OTC.  Would that be ok to take?  I also advised her that the antibiotic is still working.

## 2012-05-11 NOTE — Telephone Encounter (Signed)
lmom to cb. 

## 2012-05-11 NOTE — Telephone Encounter (Signed)
Z pack is still working for a total of  10 days.  She can try mucinex to help with congestion in her chest.  There is no other cough meds - she could try PTC delsym.  Please make sure she is using her inhaler.

## 2012-05-12 NOTE — Telephone Encounter (Signed)
Spoke with pt advised message from Maralyn Sago, pt understood and will let us know her status in the next couple of days.

## 2012-06-02 ENCOUNTER — Encounter: Payer: Self-pay | Admitting: Emergency Medicine

## 2012-07-28 ENCOUNTER — Encounter: Payer: Medicare Other | Admitting: Family Medicine

## 2012-08-13 DIAGNOSIS — R928 Other abnormal and inconclusive findings on diagnostic imaging of breast: Secondary | ICD-10-CM | POA: Diagnosis not present

## 2012-08-13 DIAGNOSIS — N63 Unspecified lump in unspecified breast: Secondary | ICD-10-CM | POA: Diagnosis not present

## 2012-08-13 DIAGNOSIS — Z09 Encounter for follow-up examination after completed treatment for conditions other than malignant neoplasm: Secondary | ICD-10-CM | POA: Diagnosis not present

## 2012-08-15 ENCOUNTER — Encounter: Payer: Self-pay | Admitting: Gastroenterology

## 2012-08-17 DIAGNOSIS — C50919 Malignant neoplasm of unspecified site of unspecified female breast: Secondary | ICD-10-CM

## 2012-08-17 HISTORY — DX: Malignant neoplasm of unspecified site of unspecified female breast: C50.919

## 2012-08-18 ENCOUNTER — Other Ambulatory Visit: Payer: Self-pay | Admitting: Radiology

## 2012-08-18 DIAGNOSIS — N6009 Solitary cyst of unspecified breast: Secondary | ICD-10-CM | POA: Diagnosis not present

## 2012-08-18 DIAGNOSIS — N63 Unspecified lump in unspecified breast: Secondary | ICD-10-CM | POA: Diagnosis not present

## 2012-08-18 DIAGNOSIS — D0590 Unspecified type of carcinoma in situ of unspecified breast: Secondary | ICD-10-CM | POA: Diagnosis not present

## 2012-08-18 DIAGNOSIS — Z09 Encounter for follow-up examination after completed treatment for conditions other than malignant neoplasm: Secondary | ICD-10-CM | POA: Diagnosis not present

## 2012-08-18 DIAGNOSIS — C50919 Malignant neoplasm of unspecified site of unspecified female breast: Secondary | ICD-10-CM | POA: Diagnosis not present

## 2012-08-19 ENCOUNTER — Other Ambulatory Visit: Payer: Self-pay | Admitting: Radiology

## 2012-08-19 DIAGNOSIS — C50919 Malignant neoplasm of unspecified site of unspecified female breast: Secondary | ICD-10-CM

## 2012-08-20 DIAGNOSIS — C50919 Malignant neoplasm of unspecified site of unspecified female breast: Secondary | ICD-10-CM | POA: Diagnosis not present

## 2012-08-22 ENCOUNTER — Ambulatory Visit: Payer: Medicare Other | Attending: Gynecology | Admitting: Gynecology

## 2012-08-22 ENCOUNTER — Ambulatory Visit: Payer: Medicare Other | Admitting: Gynecology

## 2012-08-22 ENCOUNTER — Telehealth: Payer: Self-pay | Admitting: Gynecology

## 2012-08-22 ENCOUNTER — Encounter: Payer: Self-pay | Admitting: Gynecology

## 2012-08-22 VITALS — BP 148/62 | HR 78 | Temp 97.9°F | Resp 18 | Ht 61.5 in | Wt 173.5 lb

## 2012-08-22 DIAGNOSIS — Z79899 Other long term (current) drug therapy: Secondary | ICD-10-CM | POA: Insufficient documentation

## 2012-08-22 DIAGNOSIS — E669 Obesity, unspecified: Secondary | ICD-10-CM | POA: Diagnosis not present

## 2012-08-22 DIAGNOSIS — Z7982 Long term (current) use of aspirin: Secondary | ICD-10-CM | POA: Diagnosis not present

## 2012-08-22 DIAGNOSIS — C50919 Malignant neoplasm of unspecified site of unspecified female breast: Secondary | ICD-10-CM | POA: Diagnosis not present

## 2012-08-22 DIAGNOSIS — N736 Female pelvic peritoneal adhesions (postinfective): Secondary | ICD-10-CM | POA: Insufficient documentation

## 2012-08-22 DIAGNOSIS — I10 Essential (primary) hypertension: Secondary | ICD-10-CM | POA: Insufficient documentation

## 2012-08-22 DIAGNOSIS — E119 Type 2 diabetes mellitus without complications: Secondary | ICD-10-CM | POA: Diagnosis not present

## 2012-08-22 DIAGNOSIS — N950 Postmenopausal bleeding: Secondary | ICD-10-CM | POA: Insufficient documentation

## 2012-08-22 DIAGNOSIS — E785 Hyperlipidemia, unspecified: Secondary | ICD-10-CM | POA: Diagnosis not present

## 2012-08-22 DIAGNOSIS — N8501 Benign endometrial hyperplasia: Secondary | ICD-10-CM | POA: Diagnosis not present

## 2012-08-22 NOTE — Progress Notes (Signed)
Consult Note: Gyn-Onc   [name] 67 y.o. female  Chief Complaint  Patient presents with  . Postmenopausal Bleeding    Follow up    Assessment: Abnormal uterine bleeding now been well controlled using continuous Provera.  Plan patient continue Provera unless instructed otherwise bar her medical oncologist. If she has to discontinue Provera I would suggest she have a Mirena IUD placed. She returned to see me in 6 months.  Interval History: The patient was last seen in November of 2013. She returns today as previously scheduled for followup. She's been taking Provera 10 mg daily since her last visit. She's had no further bleeding. She's tolerating the Provera well. She denies any other GI or GU symptoms. She has no pelvic pain or pressure. Unfortunately, she's recently been diagnosed with a cancer the left breast. Further evaluation and treatment planning it is underway. The breast cancer diagnosed by mammography and recent biopsy.  HPI:The patient has a long-standing history of abnormal bleeding. She has undergone several D&Cs and endometrial biopsies some of which have shown simple hyperplasia without atypia. For a time she was treated with Provera but on an inconsistent regimen. She underwent exploratory laparotomy in April 2009 for a planned hysterectomy to manage her bleeding. However extensive adhesions were encountered and the procedure was abandoned. In February 2011 she underwent hysteroscopy and D&C which revealed superficial fragments of weakly proliferative endometrium. No atypia hyperplasia or malignancy was identified. It is interesting to note that this was proliferative endometrium given the fact that the patient was supposed to have been taking Provera. Since 2011 she has not received any additional hormonal management. She underwent a D&C on 06/22/2011. Final pathology showed disordered proliferative endometrium with no evidence of hyperplasia. The patient was placed on  Provera 10 mg daily.  Review of Systems:10 point review of systems is negative as noted above.   Vitals: Blood pressure 148/62, pulse 78, temperature 97.9 F (36.6 C), resp. rate 18, height 5' 1.5" (1.562 m), weight 173 lb 8 oz (78.699 kg).  Physical Exam: General : The patient is a healthy woman in no acute distress.  HEENT: normocephalic, extraoccular movements normal; neck is supple without thyromegally  Lynphnodes: Supraclavicular and inguinal nodes not enlarged  Abdomen: Soft, non-tender, no ascites, no organomegally, no masses, no hernias  Pelvic:  EGBUS: Normal female  Vagina: Normal, no lesions  Urethra and Bladder: Normal, non-tender  Cervix: normal  Uterus: Anterior normal shape and size Bi-manual examination: Non-tender; no adenxal masses or nodularity  Rectal: normal sphincter tone, no masses, no blood  Lower extremities: No edema or varicosities. Normal range of motion    Assessment/Plan:  Abnormal bleeding now controlled with provera.  Continue provera 10 mg daily.  RTC 6 months or prn bleeding  Allergies  Allergen Reactions  . Latex Itching and Other (See Comments)    Causes blisters    Past Medical History  Diagnosis Date  . Postmenopausal bleeding   . Fibroids   . Diabetes mellitus   . Diverticulitis   . Endometrial hyperplasia   . Environmental allergies   . OSA on CPAP   . Hypertension   . White coat hypertension   . Hyperlipidemia   . Obesity   . Sleep apnea   . Osteoarthritis of hand BILATERAL HANDS  . Arthritis SHOULDERS, BACK  . Asthma with allergic rhinitis     local honey  . Breast cancer     Past Surgical History  Procedure Laterality Date  . Hysteroscopy  w/d&c  X3 LAST ONE 05-07-1999  . Exploratory laparotomy  06-30-2007    ATTEMPTED HYSTERECTOMY ABORTED DUE TO EXTENSIVE ENDOMETRIOSIS W/ DENSE PELVIC ADHESIONS INVOLVING UTERUS AND LOWER RECTOSIGMOID  . Cholecystectomy  1995  . Back surgery  1997  . Dilation and curettage of uterus   2005  . Dilation and curettage of uterus  06/22/2011    Procedure: DILATATION AND CURETTAGE;  Surgeon: Jeannette Corpus, MD;  Location: [facility];  Service: Gynecology;  Laterality: N/A;  OK PER KEELA FOR 7:15 START  . Back surgery  1997  . Dilation and curettage of uterus  2013  . Colonoscopy w/ polypectomy  03/20/2007    colon polyps.    . Breast biopsy  08/18/12    left breast    Current Outpatient Prescriptions  Medication Sig Dispense Refill  . Cholecalciferol (VITAMIN D-3 PO) Take by mouth daily.      . Cyanocobalamin (VITAMIN B-12 CR PO) Take 500 mcg by mouth daily. Unsure of dose      . lisinopril-hydrochlorothiazide (PRINZIDE,ZESTORETIC) 10-12.5 MG per tablet Take 0.5 tablets by mouth daily.      . medroxyPROGESTERone (PROVERA) 10 MG tablet Take 10 mg by mouth daily. Takes two 5mg  tablets      . Omega-3 Fatty Acids (FISH OIL) 1000 MG CAPS Take 1 capsule by mouth daily.      . vitamin C (ASCORBIC ACID) 500 MG tablet Take 1,000 mg by mouth daily.       . Zinc 50 MG TABS Take 1 tablet by mouth daily.      . ALBUTEROL IN Inhale into the lungs as needed.      Marland Kitchen aspirin 81 MG tablet Take 81 mg by mouth daily.      Marland Kitchen azithromycin (ZITHROMAX) 250 MG tablet Take 2 tabs PO x 1 dose, then 1 tab PO QD x 4 days  6 tablet  0  . benzonatate (TESSALON) 200 MG capsule Take 1 capsule (200 mg total) by mouth 3 (three) times daily as needed for cough.  20 capsule  0  . fluticasone (FLONASE) 50 MCG/ACT nasal spray Place 1 spray into the nose daily.  16 g  6  . fluticasone (FLONASE) 50 MCG/ACT nasal spray Place 4 sprays into the nose daily.  16 g  12  . loratadine (CLARITIN) 10 MG tablet Take 10 mg by mouth as needed.      . metFORMIN (GLUCOPHAGE XR) 500 MG 24 hr tablet Take 1 tablet (500 mg total) by mouth 2 (two) times daily.  180 tablet  3  . pravastatin (PRAVACHOL) 40 MG tablet Take 20 mg by mouth every evening.      . [DISCONTINUED] LISINOPRIL PO Take 1 tablet by mouth daily.        No current facility-administered medications for this visit.    History   Social History  . Marital Status: Married    Spouse Name: N/A    Number of Children: N/A  . Years of Education: N/A   Occupational History  . Not on file.   Social History Main Topics  . Smoking status: Never Smoker   . Smokeless tobacco: Not on file  . Alcohol Use: No  . Drug Use: No  . Sexually Active: Not on file   Other Topics Concern  . Not on file   Social History Narrative   Marital status: married x 47 years; happily married; no abuse.      Children: 4 children; six  grandchildren.      Lives:  With husband.      Employed: retired in 2011; [employer].      Tobacco: none       Alcohol:  None      Drugs: none      Exercise:  Daily.    Family History  Problem Relation Age of Onset  . Hypertension Other   . Vision loss Mother   . Hypertension Mother   . Depression Father   . Heart disease Sister   . Hypertension Sister   . Diabetes Brother   . Hypertension Brother   . Cancer Brother     skin  . Hyperlipidemia Brother       Jeannette Corpus, MD 08/22/2012, 8:21 AM

## 2012-08-22 NOTE — Patient Instructions (Addendum)
Continue taking Provera but ask your medical oncologist as to whether they wish to change. If they want to stop taking Provera we will ask Dr. Chevis Pretty to place a Mirena IUD.  Return to see me in 6 months.

## 2012-08-25 ENCOUNTER — Telehealth: Payer: Self-pay | Admitting: *Deleted

## 2012-08-25 DIAGNOSIS — C50112 Malignant neoplasm of central portion of left female breast: Secondary | ICD-10-CM

## 2012-08-25 NOTE — Telephone Encounter (Signed)
Left 2 messages for pt to return call to confirm [facility] appt on 08/27/12.  Solis MD gave pt appt date and time at time of dx discussion.

## 2012-08-26 ENCOUNTER — Telehealth: Payer: Self-pay | Admitting: Neurology

## 2012-08-26 ENCOUNTER — Encounter: Payer: Medicare Other | Admitting: Family Medicine

## 2012-08-26 ENCOUNTER — Ambulatory Visit
Admission: RE | Admit: 2012-08-26 | Discharge: 2012-08-26 | Disposition: A | Discharge status: Home / Self Care | Payer: Medicare Other | Source: Ambulatory Visit | Attending: Radiology | Admitting: Radiology

## 2012-08-26 DIAGNOSIS — C50919 Malignant neoplasm of unspecified site of unspecified female breast: Secondary | ICD-10-CM

## 2012-08-26 MED ORDER — GADOBENATE DIMEGLUMINE 529 MG/ML IV SOLN
16.0000 mL | Freq: Once | INTRAVENOUS | Status: AC | PRN
  Administered 2012-08-26: 16 mL via INTRAVENOUS

## 2012-08-26 NOTE — Telephone Encounter (Signed)
Called patient on her cell number and home number left voice mail on both need more information so we can get her message to doctor.

## 2012-08-26 NOTE — Telephone Encounter (Signed)
Patient is calling to tell us she received a message on her machine stating she needs service.  She tells me she's called several time and has not received a call back.  The patient is concerned if she should call her service provider or? Please give the patient a call asap.

## 2012-08-26 NOTE — Telephone Encounter (Signed)
Pt calling about cpap, st card   Out of town this last weekend and st card relayed error.   She spoke to [place] home pt and they relayed to check with [facility], Dr. Vickey Huger is she wanting to continue with this cpap recording?   Deferred to [facility] in sleep lab.

## 2012-08-27 ENCOUNTER — Ambulatory Visit
Admission: RE | Admit: 2012-08-27 | Discharge: 2012-08-27 | Disposition: A | Discharge status: Home / Self Care | Payer: Medicare Other | Source: Ambulatory Visit | Attending: Radiation Oncology | Admitting: Radiation Oncology

## 2012-08-27 ENCOUNTER — Ambulatory Visit: Payer: Medicare Other

## 2012-08-27 ENCOUNTER — Encounter: Payer: Self-pay | Admitting: *Deleted

## 2012-08-27 ENCOUNTER — Ambulatory Visit (HOSPITAL_BASED_OUTPATIENT_CLINIC_OR_DEPARTMENT_OTHER): Payer: Medicare Other | Admitting: Oncology

## 2012-08-27 ENCOUNTER — Encounter: Payer: Self-pay | Admitting: Oncology

## 2012-08-27 ENCOUNTER — Ambulatory Visit (HOSPITAL_BASED_OUTPATIENT_CLINIC_OR_DEPARTMENT_OTHER): Payer: Medicare Other | Admitting: General Surgery

## 2012-08-27 ENCOUNTER — Encounter: Payer: Self-pay | Admitting: Radiation Oncology

## 2012-08-27 ENCOUNTER — Ambulatory Visit: Payer: Medicare Other | Attending: General Surgery | Admitting: Physical Therapy

## 2012-08-27 ENCOUNTER — Encounter: Payer: Self-pay | Admitting: Specialist

## 2012-08-27 ENCOUNTER — Other Ambulatory Visit (HOSPITAL_BASED_OUTPATIENT_CLINIC_OR_DEPARTMENT_OTHER): Payer: Medicare Other | Admitting: Lab

## 2012-08-27 ENCOUNTER — Telehealth: Payer: Self-pay | Admitting: Oncology

## 2012-08-27 VITALS — BP 144/77 | HR 82 | Temp 98.6°F | Resp 20 | Ht 61.5 in | Wt 178.2 lb

## 2012-08-27 DIAGNOSIS — IMO0001 Reserved for inherently not codable concepts without codable children: Secondary | ICD-10-CM | POA: Insufficient documentation

## 2012-08-27 DIAGNOSIS — R293 Abnormal posture: Secondary | ICD-10-CM | POA: Insufficient documentation

## 2012-08-27 DIAGNOSIS — C50919 Malignant neoplasm of unspecified site of unspecified female breast: Secondary | ICD-10-CM | POA: Insufficient documentation

## 2012-08-27 DIAGNOSIS — C50119 Malignant neoplasm of central portion of unspecified female breast: Secondary | ICD-10-CM | POA: Diagnosis not present

## 2012-08-27 DIAGNOSIS — C50112 Malignant neoplasm of central portion of left female breast: Secondary | ICD-10-CM

## 2012-08-27 DIAGNOSIS — Z01818 Encounter for other preprocedural examination: Secondary | ICD-10-CM | POA: Insufficient documentation

## 2012-08-27 LAB — CBC WITH DIFFERENTIAL/PLATELET
Basophils Absolute: 0.1 10*3/uL (ref 0.0–0.1)
EOS%: 4.7 % (ref 0.0–7.0)
Eosinophils Absolute: 0.3 10*3/uL (ref 0.0–0.5)
HCT: 41.1 % (ref 34.8–46.6)
HGB: 14 g/dL (ref 11.6–15.9)
MCH: 29.7 pg (ref 25.1–34.0)
MCV: 87.1 fL (ref 79.5–101.0)
MONO%: 6.9 % (ref 0.0–14.0)
NEUT#: 4.1 10*3/uL (ref 1.5–6.5)
NEUT%: 61.6 % (ref 38.4–76.8)
Platelets: 239 10*3/uL (ref 145–400)
RDW: 13.4 % (ref 11.2–14.5)

## 2012-08-27 LAB — COMPREHENSIVE METABOLIC PANEL (CC13)
CO2: 27 mEq/L (ref 22–29)
Creatinine: 0.8 mg/dL (ref 0.6–1.1)
Glucose: 142 mg/dl — ABNORMAL HIGH (ref 70–99)
Total Bilirubin: 0.67 mg/dL (ref 0.20–1.20)

## 2012-08-27 NOTE — Progress Notes (Signed)
Checked in new patient. She does have POA/living will but didn't bring with her. She gets emails and I added phone as means of communication. No financial issues.

## 2012-08-27 NOTE — Progress Notes (Signed)
She did have her [facility] packet.

## 2012-08-27 NOTE — Progress Notes (Signed)
[name] [id number] 1945-12-17 67 y.o. 08/27/2012 2:03 PM  CC  [name], MD [address] [place] [address] Dr. Glenna Fellows Dr. Antony Blackbird  REASON FOR CONSULTATION:  67 year old female with new diagnosis of screen detected invasive mammary carcinoma with lobular features. Patient is seen in the multidisciplinary breast clinic for discussion of treatment options.   STAGE:   Cancer of central portion of female breast   Primary site: Breast (Left)   Staging method: AJCC 7th Edition   Clinical: Stage IA (T1c, N0, cM0)   Summary: Stage IA (T1c, N0, cM0)  REFERRING PHYSICIAN: Dr. Sharlet Salina Hoxworth  HISTORY OF PRESENT ILLNESS:  [name] is a 67 y.o. female.  With medical problem significant for hypertension dysfunctional uterine bleeding. Patient is currently on Provera and is followed by her gynecologist and Dr. Loree Fee. Patient recently will underwent a screening mammogram and she was found to have a questionable mass just deep to the nipple in the superior. Periareolar region. Calcifications were present adjacent to but not within the mass. There was also noted to be focal abnormal density medially on the craniocaudal projection. Left breast ultrasound revealed a hypoechoic mass in the 12:00 position 1 cm from the nipple measuring 5 x 8 mm. There was also an ovoid cystic appearing lesion in the 11:00 position 2 cm from the nipple measuring 1.5 x 0.4 cm. Patient had a ultrasound guided core biopsy performed. The biopsy revealed invasive mammary carcinoma with mammary carcinoma in situ grade 2 with lobular features. Tumor was ER +100% PR +63% proliferation marker Ki-67 58% HER-2/neu showed amplification with a ratio of 2.48. Patient's case was discussed at the altered is married breast conference. Patient also had MRI of the breasts performed the MRI showed irregular enhancing mass within spiculated margins posterior and lateral to this mass wasn't  immediately adjacent smaller satellite mass demonstrating similar features together the 2 masses measured 17 mm x 11 mm x 15 mm. There were no other abnormalities left axilla was negative. Patient's radiology and pathology were reviewed at the multidisciplinary breast conference. She is now seen in the multidisciplinary breast clinic for discussion of treatment options. She was also seen by Dr. Glenna Fellows and Dr. Antony Blackbird. She is without any complaints.   Past Medical History: Past Medical History  Diagnosis Date  . Postmenopausal bleeding   . Fibroids   . Diabetes mellitus   . Diverticulitis   . Endometrial hyperplasia   . Environmental allergies   . OSA on CPAP   . Hypertension   . White coat hypertension   . Hyperlipidemia   . Obesity   . Sleep apnea   . Osteoarthritis of hand BILATERAL HANDS  . Arthritis SHOULDERS, BACK  . Asthma with allergic rhinitis     local honey  . Breast cancer     Past Surgical History: Past Surgical History  Procedure Laterality Date  . Hysteroscopy w/d&c  X3 LAST ONE 05-07-1999  . Exploratory laparotomy  06-30-2007    ATTEMPTED HYSTERECTOMY ABORTED DUE TO EXTENSIVE ENDOMETRIOSIS W/ DENSE PELVIC ADHESIONS INVOLVING UTERUS AND LOWER RECTOSIGMOID  . Cholecystectomy  1995  . Back surgery  1997  . Dilation and curettage of uterus  2005  . Dilation and curettage of uterus  06/22/2011    Procedure: DILATATION AND CURETTAGE;  Surgeon: Jeannette Corpus, MD;  [facility]  Service: Gynecology;  Laterality: N/A;  OK PER KEELA FOR 7:15 START  . Back surgery  1997  .  Dilation and curettage of uterus  2013  . Colonoscopy w/ polypectomy  03/20/2007    colon polyps.    . Breast biopsy  08/18/12    left breast    Family History: Family History  Problem Relation Age of Onset  . Hypertension Other   . Vision loss Mother   . Hypertension Mother   . Depression Father   . Heart disease Sister   . Hypertension Sister    . Diabetes Brother   . Hypertension Brother   . Cancer Brother     skin  . Hyperlipidemia Brother     Social History History  Substance Use Topics  . Smoking status: Never Smoker   . Smokeless tobacco: Not on file  . Alcohol Use: No    Allergies: Allergies  Allergen Reactions  . Latex Itching and Other (See Comments)    Causes blisters    Current Medications: Current Outpatient Prescriptions  Medication Sig Dispense Refill  . ALBUTEROL IN Inhale into the lungs as needed.      Marland Kitchen aspirin 81 MG tablet Take 81 mg by mouth daily.      Marland Kitchen azithromycin (ZITHROMAX) 250 MG tablet Take 2 tabs PO x 1 dose, then 1 tab PO QD x 4 days  6 tablet  0  . benzonatate (TESSALON) 200 MG capsule Take 1 capsule (200 mg total) by mouth 3 (three) times daily as needed for cough.  20 capsule  0  . Cholecalciferol (VITAMIN D-3 PO) Take by mouth daily.      . Cyanocobalamin (VITAMIN B-12 CR PO) Take 500 mcg by mouth daily. Unsure of dose      . fluticasone (FLONASE) 50 MCG/ACT nasal spray Place 1 spray into the nose daily.  16 g  6  . fluticasone (FLONASE) 50 MCG/ACT nasal spray Place 4 sprays into the nose daily.  16 g  12  . lisinopril-hydrochlorothiazide (PRINZIDE,ZESTORETIC) 10-12.5 MG per tablet Take 0.5 tablets by mouth daily.      Marland Kitchen loratadine (CLARITIN) 10 MG tablet Take 10 mg by mouth as needed.      . medroxyPROGESTERone (PROVERA) 10 MG tablet Take 10 mg by mouth daily. Takes two 5mg  tablets      . metFORMIN (GLUCOPHAGE XR) 500 MG 24 hr tablet Take 1 tablet (500 mg total) by mouth 2 (two) times daily.  180 tablet  3  . Omega-3 Fatty Acids (FISH OIL) 1000 MG CAPS Take 1 capsule by mouth daily.      . pravastatin (PRAVACHOL) 40 MG tablet Take 20 mg by mouth every evening.      . vitamin C (ASCORBIC ACID) 500 MG tablet Take 1,000 mg by mouth daily.       . Zinc 50 MG TABS Take 1 tablet by mouth daily.      . [DISCONTINUED] LISINOPRIL PO Take 1 tablet by mouth daily.       No current  facility-administered medications for this visit.    OB/GYN History:menarche at age 54 she underwent menopause in January 2004 she is currently on Provera for the last 2 years to severe dysfunctional uterine bleeding. She has had 4 live births first live birth was at 28.  Fertility Discussion:not applicable Prior History of Cancer: no  Health Maintenance:  Colonoscopy yes 2010 Bone Density no Last PAP smear 2012  ECOG PERFORMANCE STATUS: 0 - Asymptomatic  Genetic Counseling/testing: no  REVIEW OF SYSTEMS:  A comprehensive review of systems was negative.  PHYSICAL EXAMINATION: Blood pressure 144/77,  pulse 82, temperature 98.6 F (37 C), temperature source Oral, resp. rate 20, height 5' 1.5" (1.562 m), weight 178 lb 3.2 oz (80.831 kg). Patient is well-developed nourished female in no acute distress HEENT exam EOMI PERRLA sclerae anicteric no conjunctival pallor oral mucosa is moist neck is supple lungs are clear bilaterally cardiovascular is regular rate rhythm abdomen is soft nontender nondistended bowel sounds are present no HSM extremities no edema neuro patient's alert oriented otherwise nonfocal Left breast thickening at the biopsy site otherwise no prominent masses. Right breast no masses or nipple discharge    STUDIES/RESULTS: Mr Breast Bilateral W Wo Contrast  08/26/2012   *RADIOLOGY REPORT*  Clinical Data: recent diagnosis of in situ and invasive carcinoma left breast  BUN and creatinine were obtained on site at [facility] at [address] Results:  BUN 11 mg/dL,  Creatinine 0.9 mg/dL.  BILATERAL BREAST MRI WITH AND WITHOUT CONTRAST  Technique: Multiplanar, multisequence MR images of both breasts were obtained prior to and following the intravenous administration of 16ml of Multihance.  Three dimensional images were evaluated at the independent DynaCad workstation.  Comparison:  all recent images from [facility] performed June 2014  Findings: There is  mild background parenchymal enhancement.  The right breast and axilla are negative.  On the left, there is an anterior biopsy marker clip in the 12 o'clock position.  This is associated with a irregular enhancing mass with spiculated margins.  Posterior and lateral to this mass is an immediately adjacent smaller satellite mass demonstrating similar features.  Together, these masses measure 17mm (AP) x 11mm (lateral) x 15mm (craniocaudal).  There are no other parencymal abnormalities.  The left axilla is negative.  IMPRESSION: Dominant, biopsied mass with small immediately adjacent satellite mass consistent with biopsy results indicating malignancy, measuring 17 x 11 x 15mm.  BI-RADS CATEGORY 6:  Known biopsy-proven malignancy - appropriate action should be taken.  THREE-DIMENSIONAL MR IMAGE RENDERING ON INDEPENDENT WORKSTATION:  Three-dimensional MR images were rendered by post-processing of the original MR data on an independent workstation.  The three- dimensional MR images were interpreted, and findings were reported in the accompanying complete MRI report for this study.   Original Report Authenticated By: Esperanza Heir, M.D.     LABS:    Chemistry      Component Value Date/Time   NA 138 08/27/2012 1219   NA 137 04/21/2012 0819   K 3.7 08/27/2012 1219   K 3.9 04/21/2012 0819   CL 104 08/27/2012 1219   CL 103 04/21/2012 0819   CO2 27 08/27/2012 1219   CO2 27 04/21/2012 0819   BUN 12.6 08/27/2012 1219   BUN 17 04/21/2012 0819   CREATININE 0.8 08/27/2012 1219   CREATININE 0.62 04/21/2012 0819   CREATININE 0.75 06/22/2011 0625      Component Value Date/Time   CALCIUM 10.0 08/27/2012 1219   CALCIUM 10.0 04/21/2012 0819   ALKPHOS 100 08/27/2012 1219   ALKPHOS 97 04/21/2012 0819   AST 14 08/27/2012 1219   AST 13 04/21/2012 0819   ALT 12 08/27/2012 1219   ALT 9 04/21/2012 0819   BILITOT 0.67 08/27/2012 1219   BILITOT 1.0 04/21/2012 0819      Lab Results  Component Value Date   WBC 6.6 08/27/2012   HGB 14.0  08/27/2012   HCT 41.1 08/27/2012   MCV 87.1 08/27/2012   PLT 239 08/27/2012   PATHOLOGY: ADDITIONAL INFORMATION: PROGNOSTIC INDICATORS - ACIS Results: IMMUNOHISTOCHEMICAL AND MORPHOMETRIC ANALYSIS BY  THE AUTOMATED CELLULAR IMAGING SYSTEM (ACIS) Estrogen Receptor: 100%, POSITIVE, STRONG STAINING INTENSITY Progesterone Receptor: 63%, POSITIVE, STRONG STAINING INTENSITY Proliferation Marker Ki67: 58% REFERENCE RANGE ESTROGEN RECEPTOR NEGATIVE <1% POSITIVE =>1% PROGESTERONE RECEPTOR NEGATIVE <1% POSITIVE =>1% All controls stained appropriately Jimmy Picket MD Pathologist, Electronic Signature ( Signed 08/22/2012) CHROMOGENIC IN-SITU HYBRIDIZATION Results: HER2/NEU BY CISH - SHOWS AMPLIFICATION BY CISH ANALYSIS. RESULT RATIO OF HER2: CEP 17 SIGNALS 2.48 AVERAGE HER2 COPY NUMBER PER CELL 6.45 REFERENCE RANGE 1 of 3 FINAL for [name], [name] ([id number]) ADDITIONAL INFORMATION:(continued) NEGATIVE HER2/Chr17 Ratio <2.0 and Average HER2 copy number <4.0 EQUIVOCAL HER2/Chr17 Ratio <2.0 and Average HER2 copy number 4.0 and <6.0 POSITIVE HER2/Chr17 Ratio >=2.0 and/or Average HER2 copy number >=6.0 Jimmy Picket MD Pathologist, Electronic Signature ( Signed 08/22/2012) FINAL DIAGNOSIS Diagnosis Breast, left, needle core biopsy, mass - INVASIVE MAMMARY CARCINOMA. - MAMMARY CARCINOMA IN SITU. - SEE COMMENT. Microscopic Comment The carcinoma is grade II and has lobular features. A breast prognostic profile will be performed and the results reported separately. The results were called to [facility] on 08/19/2012. (JBK:kh 08/19/12) Diane Leisure MD Pathologist, Electronic Signature (Case signed 08/19/2012) S ASSESSMENT    67 year old female with  #1Screen detected invasive mammary carcinoma with lobular features measuring total of 1.7 cm.the tumor is ER positive PR positive HER-2/neu is amplified at 2.48. Ki-67 elevated at 58%. There is associated memory carcinoma in situ.  Patient's case was discussed at the multidisciplinary breast conference. She is a good candidate for a lumpectomy and sentinel lymph node biopsy. Patient was seen by Dr. Glenna Fellows. She is very much interested in having lumpectomy.  #2 patient and I discussed her radiology pathology and adjuvant treatment options. Certainly if patient indeed is HER-2 positive she would receive chemotherapy and HER-2 based therapy followed by antiestrogen therapy. However it is an unusual situation where patient in fact may have invasive lobular carcinoma and is HER-2 amplified. I discussed this with the patient I do think that it is prudent that we check the final pathology for the HER-2 amplification as well. If her final pathology is HER-2 amplified then she would need a candidate for adjuvant chemotherapy and HER-2 based therapy. She and I discussed the rationale for waiting to find out her final HER-2 status. She has been doing a lot of reading and she understands my rationale. And would like to wait at this time prior to deciding whether or not she wants to pursue chemotherapy and have a Port-A-Cath placed.  #3 patient was seen by radiation oncology and certainly she will undergo radiation therapy adjuvantly.  Clinical Trial Eligibility: no Multidisciplinary conference discussion yes     PLAN:    #1 patient will proceed with lumpectomy with sentinel lymph node biopsy. We will hold on putting a Port-A-Cath.  #2 if patient's final pathology is HER-2 positive then certainly she will need chemotherapy and Herceptin. If it is negative then we would send off an Oncotype DX score on the final pathology to decide whether or not she needs chemotherapy versus only antiestrogen therapy.  #3 patient will be seen back in about 3-4 weeks' time for followup to discuss her final pathology and further recommendations.      Discussion: Patient is being treated per NCCN breast cancer care guidelines appropriate for  stage.I   Thank you so much for allowing me to participate in the care of [name]. I will continue to follow up the patient with you and assist in her care.  All questions were answered. The patient knows to call the clinic with any problems, questions or concerns. We can certainly see the patient much sooner if necessary.  I spent 55 minutes counseling the patient face to face. The total time spent in the appointment was 60 minutes.  Drue Second, MD Medical/Oncology [facility] [phone] (beeper) [phone] (Office)  08/27/2012, 2:03 PM

## 2012-08-27 NOTE — Patient Instructions (Signed)
We discussed your radiology/pathology today  We discussed treatment for breast cancer  You are a candidate for lumpectomy and sentinel node biopsy  We discussed possibility of treatment with anti-estrogen therapy. Your core biopsy revealed the tumor to be Her2neu positive but we will confirm this on final patho before making any decisions about chemo and her2 therapy  I will see you back in 3-4 weeks time

## 2012-08-27 NOTE — Progress Notes (Signed)
Subjective:      Patient ID: [name], female   DOB: 1946-01-11, 67 y.o.   MRN: [id number]  HPI Patient is a very pleasant 67 year old female referred by Dr. Tilda Burrow for a new diagnosis of left breast cancer. She was recently recalled for short-term followup due to a questionably abnormal mammogram. Questionable mass was seen just deep to the nipple in the upper breast. Left breast ultrasound revealed a hypoechoic mass at the 12:00 position 1 cm from the nipple measuring 8 mm in diameter. There was a cystic-appearing mass deeper in the left breast as well. Ultrasound-guided core biopsy was recommended and performed. This has revealed invasive mammary carcinoma with lobular features, ER PR positive, HER-2/neu-positive with a proliferation marker a 58%. Of note is the more posterior lesion in the breast was felt to be a simple cyst on ultrasound. Subsequent breast MR has been performed showing an irregular mass with spiculated margins in the area of the known cancer with a marker clip which measures 17 x 15 mm. No other abnormalities were seen. The patient is seen today in the multidisciplinary clinic for treatment planning. She has not been able to feel a mass in her breast either before or since her diagnosis. She denies nipple bleeding or discharge or skin changes. She has no previous history of breast disease or breast biopsies. No significant family history of breast cancer.  Past Medical History  Diagnosis Date  . Postmenopausal bleeding   . Fibroids   . Diabetes mellitus   . Diverticulitis   . Endometrial hyperplasia   . Environmental allergies   . OSA on CPAP   . Hypertension   . White coat hypertension   . Hyperlipidemia   . Obesity   . Sleep apnea   . Osteoarthritis of hand BILATERAL HANDS  . Arthritis SHOULDERS, BACK  . Asthma with allergic rhinitis     local honey  . Breast cancer    Past Surgical History  Procedure Laterality Date  . Hysteroscopy w/d&c  X3 LAST ONE  05-07-1999  . Exploratory laparotomy  06-30-2007    ATTEMPTED HYSTERECTOMY ABORTED DUE TO EXTENSIVE ENDOMETRIOSIS W/ DENSE PELVIC ADHESIONS INVOLVING UTERUS AND LOWER RECTOSIGMOID  . Cholecystectomy  1995  . Back surgery  1997  . Dilation and curettage of uterus  2005  . Dilation and curettage of uterus  06/22/2011    Procedure: DILATATION AND CURETTAGE;  Surgeon: Jeannette Corpus, MD;  Location: [facility];  Service: Gynecology;  Laterality: N/A;  OK PER KEELA FOR 7:15 START  . Back surgery  1997  . Dilation and curettage of uterus  2013  . Colonoscopy w/ polypectomy  03/20/2007    colon polyps.    . Breast biopsy  08/18/12    left breast   Current Outpatient Prescriptions  Medication Sig Dispense Refill  . ALBUTEROL IN Inhale into the lungs as needed.      Marland Kitchen aspirin 81 MG tablet Take 81 mg by mouth daily.      . Cholecalciferol (VITAMIN D-3 PO) Take by mouth daily.      . Cyanocobalamin (VITAMIN B-12 CR PO) Take 500 mcg by mouth daily. Unsure of dose      . fluticasone (FLONASE) 50 MCG/ACT nasal spray Place 1 spray into the nose daily.  16 g  6  . fluticasone (FLONASE) 50 MCG/ACT nasal spray Place 4 sprays into the nose daily.  16 g  12  . lisinopril-hydrochlorothiazide (PRINZIDE,ZESTORETIC) 10-12.5 MG per tablet Take  0.5 tablets by mouth daily.      Marland Kitchen loratadine (CLARITIN) 10 MG tablet Take 10 mg by mouth as needed.      . medroxyPROGESTERone (PROVERA) 10 MG tablet Take 10 mg by mouth daily. Takes two 5mg  tablets      . metFORMIN (GLUCOPHAGE XR) 500 MG 24 hr tablet Take 1 tablet (500 mg total) by mouth 2 (two) times daily.  180 tablet  3  . Omega-3 Fatty Acids (FISH OIL) 1000 MG CAPS Take 1 capsule by mouth daily.      . pravastatin (PRAVACHOL) 40 MG tablet Take 20 mg by mouth every evening.      . vitamin C (ASCORBIC ACID) 500 MG tablet Take 1,000 mg by mouth daily.       . Zinc 50 MG TABS Take 1 tablet by mouth daily.      . [DISCONTINUED] LISINOPRIL PO Take 1  tablet by mouth daily.       No current facility-administered medications for this visit.   Allergies  Allergen Reactions  . Latex Itching and Other (See Comments)    Causes blisters   History  Substance Use Topics  . Smoking status: Never Smoker   . Smokeless tobacco: Not on file  . Alcohol Use: No     Review of Systems  Constitutional: Negative.   HENT: Negative.   Respiratory: Negative.   Cardiovascular: Negative.   Gastrointestinal: Negative.   Genitourinary: Positive for menstrual problem.  Hematological: Negative.        Objective:   Physical Exam Wt Readings from Last 3 Encounters:  08/27/12 178 lb 3.2 oz (80.831 kg)  08/22/12 173 lb 8 oz (78.699 kg)  05/05/12 172 lb 6.4 oz (78.2 kg)   Temp Readings from Last 3 Encounters:  08/27/12 98.6 F (37 C) Oral  08/22/12 97.9 F (36.6 C)   05/05/12 97.8 F (36.6 C) Oral   BP Readings from Last 3 Encounters:  08/27/12 144/77  08/22/12 148/62  05/05/12 148/74   Pulse Readings from Last 3 Encounters:  08/27/12 82  08/22/12 78  05/05/12 94   General: Alert, well-developed Caucasian female, in no distress Skin: Warm and dry without rash or infection. HEENT: No palpable masses or thyromegaly. Sclera nonicteric. Pupils equal round and reactive. Oropharynx clear. Lymph nodes: No cervical, supraclavicular, or inguinal nodes palpable. Breasts: Minimal induration in the upper left breast at the site of her previous biopsy. No discrete masses in either breast. The skin changes or nipple crusting or discharge. No palpable axillary adenopathy. Lungs: Breath sounds clear and equal without increased work of breathing Cardiovascular: Regular rate and rhythm without murmur. No JVD or edema. Peripheral pulses intact. Abdomen: Nondistended. Soft and nontender. No masses palpable. No organomegaly. No palpable hernias. Extremities: No edema or joint swelling or deformity. No chronic venous stasis changes. Neurologic: Alert and  fully oriented. Gait normal.    Assessment:     New diagnosis of clinical T1 C. N0 ER PR positive, HER-2 positive, invasive lobular carcinoma of the left breast. We discussed initial surgical treatment options in detail. She would appear to be a good candidate for breast conservation which is what she would prefer. We discussed the option of mastectomy but she prefers breast conservation. I would recommend a sentinel lymph node biopsy as well. We discussed the indications for the procedure, recovery, risks of bleeding, infection, anesthetic complications and possible need for further surgery based on final pathology findings. She understands that she would need breast radiation.  All her questions were answered.    Plan:     Left breast needle localized lumpectomy and axillary sentinel lymph node biopsy as an outpatient under general anesthesia. We may want to consider overnight observation due to her history of sleep apnea I will discuss this with anesthesia. We will schedule this for as soon as possible. She is stopping her progesterone nail and we'll contact her gynecologist regarding IUD placement.

## 2012-08-27 NOTE — Telephone Encounter (Signed)
Call was transferred, spoke to patient, she will contact her DME to try and obtain another card.

## 2012-08-27 NOTE — Progress Notes (Signed)
Radiation Oncology         [phone] ________________________________  Initial outpatient Consultation  Name: [name] MRN: [id number]  Date: 08/27/2012  DOB: 11/13/1945  JX:BJYNW,GNFAOZ, MD  Hoxworth, Lorne Skeens, MD   REFERRING PHYSICIAN: [name], MD  DIAGNOSIS: The encounter diagnosis was Cancer of central portion of female breast, left. clinical stage I invasive mammary carcinoma with lobular features  HISTORY OF PRESENT ILLNESS::[name] is a 67 y.o. female who is seen out of the courtesy of Dr. Johna Sheriff for an opinion concerning radiation therapy as part of the multidisciplinary breast clinic. The patient was recently recalled for short-term followup due to a questionably abnormal mammogram. A possible mass was seen just deep to the nipple in the upper central breast. Left breast ultrasound revealed a hypoechoic mass at the 12:00 position 1 cm from the nipple measuring 8 mm in diameter. There was a cystic-appearing mass deeper in the left breast as well. Ultrasound-guided core biopsy was recommended and performed. This has revealed invasive mammary carcinoma with lobular features, ER PR positive, HER-2/neu-positive with a proliferation marker a 58%. Of note is the more posterior lesion in the breast was felt to be a simple cyst on ultrasound. Subsequent breast MRI has been performed showing an irregular mass with spiculated margins in the area of the known cancer with a marker clip.  Total area  measures 17 x 15 mm.  there was a small satellite lesion within this area. The remainder of the MRI was unremarkable.  With this information the patient is now seen for  evaluation.  PREVIOUS RADIATION THERAPY: No  PAST MEDICAL HISTORY:  has a past medical history of Postmenopausal bleeding; Fibroids; Diabetes mellitus; Diverticulitis; Endometrial hyperplasia; Environmental allergies; OSA on CPAP; Hypertension; White coat hypertension; Hyperlipidemia; Obesity; Sleep  apnea; Osteoarthritis of hand (BILATERAL HANDS); Arthritis (SHOULDERS, BACK); Asthma with allergic rhinitis; and Breast cancer.    PAST SURGICAL HISTORY: Past Surgical History  Procedure Laterality Date  . Hysteroscopy w/d&c  X3 LAST ONE 05-07-1999  . Exploratory laparotomy  06-30-2007    ATTEMPTED HYSTERECTOMY ABORTED DUE TO EXTENSIVE ENDOMETRIOSIS W/ DENSE PELVIC ADHESIONS INVOLVING UTERUS AND LOWER RECTOSIGMOID  . Cholecystectomy  1995  . Back surgery  1997  . Dilation and curettage of uterus  2005  . Dilation and curettage of uterus  06/22/2011    Procedure: DILATATION AND CURETTAGE;  Surgeon: Jeannette Corpus, MD;  Location: [facility];  Service: Gynecology;  Laterality: N/A;  OK PER KEELA FOR 7:15 START  . Back surgery  1997  . Dilation and curettage of uterus  2013  . Colonoscopy w/ polypectomy  03/20/2007    colon polyps.    . Breast biopsy  08/18/12    left breast    FAMILY HISTORY: family history includes Cancer in her brother; Depression in her father; Diabetes in her brother; Heart disease in her sister; Hyperlipidemia in her brother; Hypertension in her brother, mother, other, and sister; and Vision loss in her mother.  SOCIAL HISTORY:  reports that she has never smoked. She does not have any smokeless tobacco history on file. She reports that she does not drink alcohol or use illicit drugs.  ALLERGIES: Latex  MEDICATIONS:  Current Outpatient Prescriptions  Medication Sig Dispense Refill  . ALBUTEROL IN Inhale into the lungs as needed.      Marland Kitchen aspirin 81 MG tablet Take 81 mg by mouth daily.      . Cholecalciferol (VITAMIN D-3 PO) Take  by mouth daily.      . Cyanocobalamin (VITAMIN B-12 CR PO) Take 500 mcg by mouth daily. Unsure of dose      . fluticasone (FLONASE) 50 MCG/ACT nasal spray Place 1 spray into the nose daily.  16 g  6  . fluticasone (FLONASE) 50 MCG/ACT nasal spray Place 4 sprays into the nose daily.  16 g  12  .  lisinopril-hydrochlorothiazide (PRINZIDE,ZESTORETIC) 10-12.5 MG per tablet Take 0.5 tablets by mouth daily.      Marland Kitchen loratadine (CLARITIN) 10 MG tablet Take 10 mg by mouth as needed.      . medroxyPROGESTERone (PROVERA) 10 MG tablet Take 10 mg by mouth daily. Takes two 5mg  tablets      . metFORMIN (GLUCOPHAGE XR) 500 MG 24 hr tablet Take 1 tablet (500 mg total) by mouth 2 (two) times daily.  180 tablet  3  . Omega-3 Fatty Acids (FISH OIL) 1000 MG CAPS Take 1 capsule by mouth daily.      . pravastatin (PRAVACHOL) 40 MG tablet Take 20 mg by mouth every evening.      . vitamin C (ASCORBIC ACID) 500 MG tablet Take 1,000 mg by mouth daily.       . Zinc 50 MG TABS Take 1 tablet by mouth daily.      . [DISCONTINUED] LISINOPRIL PO Take 1 tablet by mouth daily.       No current facility-administered medications for this encounter.    REVIEW OF SYSTEMS:  A 15 point review of systems is documented in the electronic medical record. This was obtained by the nursing staff. However, I reviewed this with the patient to discuss relevant findings and make appropriate changes. Prior to biopsy the patient denied any pain in the left breast area nipple discharge or bleeding. She denies any pain in the right breast nipple discharge or bleeding. Patient denies any new bony pain headaches dizziness or blurred vision.    PHYSICAL EXAM: This is a very pleasant 67 year old female in no acute distress. She is accompanied by her husband on evaluation today. Patient [place] in the [place]. Examination of the neck and supraclavicular region reveals no evidence of adenopathy. The axillary areas are free of adenopathy. Examination of the lungs both  to be clear. The heart has a regular rhythm and rate. Examination of the right breast reveals no mass nipple discharge or bleeding. Examination of the left breast reveals bruising in the upper central aspect of the breast. There is no dominant mass appreciated in this  area or other parts the breast. There is no nipple discharge or bleeding.   LABORATORY DATA:  Lab Results  Component Value Date   WBC 6.6 08/27/2012   HGB 14.0 08/27/2012   HCT 41.1 08/27/2012   MCV 87.1 08/27/2012   PLT 239 08/27/2012   Lab Results  Component Value Date   NA 138 08/27/2012   K 3.7 08/27/2012   CL 104 08/27/2012   CO2 27 08/27/2012   Lab Results  Component Value Date   ALT 12 08/27/2012   AST 14 08/27/2012   ALKPHOS 100 08/27/2012   BILITOT 0.67 08/27/2012     RADIOGRAPHY: Mr Breast Bilateral W Wo Contrast  08/26/2012   *RADIOLOGY REPORT*  Clinical Data: recent diagnosis of in situ and invasive carcinoma left breast  BUN and creatinine were obtained on site at [facility] at [address] Results:  BUN 11 mg/dL,  Creatinine 0.9 mg/dL.  BILATERAL  BREAST MRI WITH AND WITHOUT CONTRAST  Technique: Multiplanar, multisequence MR images of both breasts were obtained prior to and following the intravenous administration of 16ml of Multihance.  Three dimensional images were evaluated at the independent DynaCad workstation.  Comparison:  all recent images from [facility] performed June 2014  Findings: There is mild background parenchymal enhancement.  The right breast and axilla are negative.  On the left, there is an anterior biopsy marker clip in the 12 o'clock position.  This is associated with a irregular enhancing mass with spiculated margins.  Posterior and lateral to this mass is an immediately adjacent smaller satellite mass demonstrating similar features.  Together, these masses measure 17mm (AP) x 11mm (lateral) x 15mm (craniocaudal).  There are no other parencymal abnormalities.  The left axilla is negative.  IMPRESSION: Dominant, biopsied mass with small immediately adjacent satellite mass consistent with biopsy results indicating malignancy, measuring 17 x 11 x 15mm.  BI-RADS CATEGORY 6:  Known biopsy-proven malignancy - appropriate action should be taken.   THREE-DIMENSIONAL MR IMAGE RENDERING ON INDEPENDENT WORKSTATION:  Three-dimensional MR images were rendered by post-processing of the original MR data on an independent workstation.  The three- dimensional MR images were interpreted, and findings were reported in the accompanying complete MRI report for this study.   Original Report Authenticated By: Esperanza Heir, M.D.      IMPRESSION: Clinical stage I invasive mammary carcinoma with lobular features, grade 2. The patient would appear to be a good candidate for breast conservation therapy with partial mastectomy,  sentinel node procedure and radiation to follow. Depending on the final pathologic results, the patient may benefit from adjuvant chemotherapy.  PLAN: The patient will be scheduled for left breast needle localized lumpectomy and sentinel node procedure by Dr. Johna Sheriff in the near future. The patient will be seen in the postoperative setting for further evaluation.  I spent 30 minutes minutes face to face with the patient and more than 50% of that time was spent in counseling and/or coordination of care.   ------------------------------------------------ -----------------------------------  Billie Lade, PhD, MD

## 2012-08-27 NOTE — Progress Notes (Signed)
I met [name] and her husband today in breast clinic.  She rated her distress as "2", attributing her lack of worry to her strong faith.  She appeared to have a very positive attitude.  At her request, I made a referral to [facility].  I also encouraged her to attend Breast Cancer Support Group and provided her with my contact information.

## 2012-08-28 ENCOUNTER — Encounter: Payer: Self-pay | Admitting: Neurology

## 2012-08-28 ENCOUNTER — Encounter (HOSPITAL_BASED_OUTPATIENT_CLINIC_OR_DEPARTMENT_OTHER): Payer: Self-pay | Admitting: *Deleted

## 2012-08-28 NOTE — Pre-Procedure Instructions (Signed)
To come for BMET and EKG 

## 2012-08-29 ENCOUNTER — Encounter: Payer: Self-pay | Admitting: Neurology

## 2012-08-29 ENCOUNTER — Ambulatory Visit (INDEPENDENT_AMBULATORY_CARE_PROVIDER_SITE_OTHER): Payer: Medicare Other | Admitting: Neurology

## 2012-08-29 VITALS — BP 117/69 | HR 77 | Temp 98.4°F | Ht 61.5 in | Wt 176.0 lb

## 2012-08-29 DIAGNOSIS — Z9989 Dependence on other enabling machines and devices: Secondary | ICD-10-CM

## 2012-08-29 DIAGNOSIS — G4733 Obstructive sleep apnea (adult) (pediatric): Secondary | ICD-10-CM | POA: Diagnosis not present

## 2012-08-29 DIAGNOSIS — I10 Essential (primary) hypertension: Secondary | ICD-10-CM

## 2012-08-29 NOTE — Patient Instructions (Signed)
Exercise to Lose Weight Exercise and a healthy diet may help you lose weight. Your doctor may suggest specific exercises. EXERCISE IDEAS AND TIPS  Choose low-cost things you enjoy doing, such as walking, bicycling, or exercising to workout videos.  Take stairs instead of the elevator.  Walk during your lunch break.  Park your car further away from work or school.  Go to a gym or an exercise class.  Start with 5 to 10 minutes of exercise each day. Build up to 30 minutes of exercise 4 to 6 days a week.  Wear shoes with good support and comfortable clothes.  Stretch before and after working out.  Work out until you breathe harder and your heart beats faster.  Drink extra water when you exercise.  Do not do so much that you hurt yourself, feel dizzy, or get very short of breath. Exercises that burn about 150 calories:  Running 1  miles in 15 minutes.  Playing volleyball for 45 to 60 minutes.  Washing and waxing a car for 45 to 60 minutes.  Playing touch football for 45 minutes.  Walking 1  miles in 35 minutes.  Pushing a stroller 1  miles in 30 minutes.  Playing basketball for 30 minutes.  Raking leaves for 30 minutes.  Bicycling 5 miles in 30 minutes.  Walking 2 miles in 30 minutes.  Dancing for 30 minutes.  Shoveling snow for 15 minutes.  Swimming laps for 20 minutes.  Walking up stairs for 15 minutes.  Bicycling 4 miles in 15 minutes.  Gardening for 30 to 45 minutes.  Jumping rope for 15 minutes.  Washing windows or floors for 45 to 60 minutes. Document Released: 04/07/2010 Document Revised: 05/28/2011 Document Reviewed: 04/07/2010 ExitCare Patient Information 2014 [employer]. Sleep Apnea Sleep apnea is disorder that affects a person's sleep. A person with sleep apnea has abnormal pauses in their breathing when they sleep. It is hard for them to get a good sleep. This makes a person tired during the day. It also can lead to other physical  problems. There are three types of sleep apnea. One type is when breathing stops for a short time because your airway is blocked (obstructive sleep apnea). Another type is when the brain sometimes fails to give the normal signal to breathe to the muscles that control your breathing (central sleep apnea). The third type is a combination of the other two types. HOME CARE  Do not sleep on your back. Try to sleep on your side.  Take all medicine as told by your doctor.  Avoid alcohol, calming medicines (sedatives), and depressant drugs.  Try to lose weight if you are overweight. Talk to your doctor about a healthy weight goal. Your doctor may have you use a device that helps to open your airway. It can help you get the air that you need. It is called a positive airway pressure (PAP) device. There are three types of PAP devices:  Continuous positive airway pressure (CPAP) device.  Nasal expiratory positive airway pressure (EPAP) device.  Bilevel positive airway pressure (BPAP) device. MAKE SURE YOU:  Understand these instructions.  Will watch your condition.  Will get help right away if you are not doing well or get worse. Document Released: 12/13/2007 Document Revised: 02/20/2012 Document Reviewed: 07/07/2011 ExitCare Patient Information 2014 [employer].  

## 2012-08-29 NOTE — Progress Notes (Signed)
[facility]  Provider:  Dr Recia Sons Referring Provider: Ethelda Chick, MD Primary Care Physician:  Diane Simmer, MD  Chief Complaint  Patient presents with  . Follow-up    HPI:  [name] is a 67 y.o. female here as a referral from Dr. Katrinka [name] for clinic followup. [name] was initially tested in a split portal call sleep study on 7-11 2011, referred at the time by Dr. Nadyne [name] . The patient had a past medical history diabetes allergic rhinitis, hyperlipidemia, hypertension and had sleep complains of excessive daytime fatigue, severe snoring as well as witnessed apneas. The patient's neck circumference was 16 inches at the time and her BMI was 42 she had endorsed the Epworth score at 4 points and the backs inventory at 8 points.  Remarkable was her high blood pressure before and after the sleep study.   She tested positive for an AHI of 30.9 and was titrated to 10 cm CPAP. She used a Corporate investment banker mask in  the study.  An annual download was not obtained  For 2014 , the patient's SD memory card in her CPAP machine is not working.   Her last download was from 08/08/2011 at the time she used CPAP  with a  set pressure of 10 cm in the a HI was 0.9. Nor central apneas were  emerging. The patient also had an average daily usage at that time of 6 hours and 40 minutes and was 100% compliant with  CMS criteria.   [name] last visit is that she has lost weight her last visit was on 08/27/2011 she has developed any new [name] problems or any recurrence of her fatigue and sleepiness and she was successful in weight loss.  ( 176 pounds- at the time of her sleep study she had 230 pounds ). Epworth sleepiness score  today it is at 3 points her fatigue score 18 points , GDS  3 points.  Regular bedtime is around  11:30 PM she wakes up at 5 to 5:30 AM spontaneous without an alarm. She would actually like to sleep longer . She sleeps through and estimates her  subjective sleep time at 6 hours, she sleeps deep and doesn't even wake up from thunderstorms etc. She does not have nocturia,  There is no snoring noted while using CPAP. The patient is now able to reduce her blood pressure medication by 50% and she is no longer using any diabetic medication since 2013 . 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  The patient never had any jaw,  neck or face surgery or trauma. There is no deviation of the septum nor retrognathia. She never had a tonsillectomy, and the tonsils are atrophied.   [name] was just diagnosed with breast cancer one week ago and is scheduled for surgery next week. She was asked to bring the CPAP machine to the surgical site. I regret not having the opportunity of the data download today given the patient's risk factors have shifted. With her weight loss as well possible that she requires a lower pressure on CPAP and he certainly should make an attempt to change her from a full face mask to a nasal pillow .             Review of Systems: Out of a complete 14 system review, the patient  complains of only the following symptoms, and all other reviewed systems are negative. Likes prone sleep.    History   Social History  . Marital Status: Married    Spouse Name: [name]    Number of Children: 4  . Years of Education: College   Occupational History  . retired    Social History Main Topics  . Smoking status: Never Smoker   . Smokeless tobacco: Never Used  . Alcohol Use: No  . Drug Use: No  . Sexually Active: Not on file   Other Topics Concern  . Not on file   Social History Narrative   Marital status: married x 47 years; happily married; no abuse.      Children: 4 children; six grandchildren.      Lives:  With husband.      Employed: retired in 2011; [employer].       Tobacco: none       Alcohol:  None      Drugs: none      Exercise:  Daily.   Her nocturia is improved under CPAP use at 10 cm water- AHI is now 0.8 and from 30 at baseline. CMS compliance  6 hours 40 minutes - sleeps on the side, 08-10-11 .FFM - likes it.    . Flonase used prn.     Caffeine Use: 1 cup daily    Family History  Problem Relation Age of Onset  . Hypertension Other   . Vision loss Mother   . Hypertension Mother   . Anesthesia problems Mother     post-op N/V  . Depression Father   . Heart disease Sister   . Hypertension Sister   . Diabetes Brother   . Hypertension Brother   . Cancer Brother     skin  . Hyperlipidemia Brother     Past Medical History  Diagnosis Date  . Postmenopausal bleeding   . Fibroids   . Headache(784.0)     tension  . Seasonal allergies   . Anemia     due to vaginal bleeding  . Urinary urgency   . Hypertension     under control with med., has been on med. x 3 yr.  . White coat hypertension   . History of endometriosis   . Breast cancer 08/2012    left  . Sleep apnea     uses CPAP nightly  . Arthritis     hands  . Dental crowns present     Past Surgical History  Procedure Laterality Date  . Hysteroscopy w/d&c  05/06/2009  . Exploratory laparotomy  06-30-2007    ATTEMPTED HYSTERECTOMY ABORTED DUE TO EXTENSIVE ENDOMETRIOSIS W/ DENSE PELVIC ADHESIONS INVOLVING UTERUS AND LOWER RECTOSIGMOID  . Cholecystectomy  1995  . Colonoscopy w/ polypectomy  03/20/2007  . Back surgery  1997  . Dilation and curettage of uterus  2005  . Dilation and curettage of uterus  06/22/2011    Procedure: DILATATION AND CURETTAGE;  Surgeon: Diane Corpus, MD;  Location: [facility];  Service: Gynecology;  Laterality: N/A;  OK PER KEELA FOR 7:15 START    Current Outpatient Prescriptions  Medication Sig Dispense Refill  . aspirin 81 MG tablet Take 81 mg by mouth daily.      . calcium citrate-vitamin D (CITRACAL+D) 315-200 MG-UNIT per  tablet Take 1 tablet by mouth 2 (two) times daily. 600 mg. Calcium, 400 mg. Vit. D3      . Cyanocobalamin (VITAMIN B-12 CR  PO) Take 500 mcg by mouth daily.       . fluticasone (FLONASE) 50 MCG/ACT nasal spray Place 4 sprays into the nose daily.  16 g  12  . Omega-3 Fatty Acids (FISH OIL) 1000 MG CAPS Take 1 capsule by mouth daily.      . vitamin C (ASCORBIC ACID) 500 MG tablet Take 1,000 mg by mouth daily.       . Zinc 50 MG TABS Take 1 tablet by mouth daily.      Marland Kitchen lisinopril-hydrochlorothiazide (PRINZIDE,ZESTORETIC) 10-12.5 MG per tablet Take 0.5 tablets by mouth daily.      . [DISCONTINUED] LISINOPRIL PO Take 1 tablet by mouth daily.       No current facility-administered medications for this visit.    Allergies as of 08/29/2012 - Review Complete 08/29/2012  Allergen Reaction Noted  . Adhesive (tape) Rash 08/28/2012    Vitals: BP 117/69  Pulse 77  Temp(Src) 98.4 F (36.9 C) (Oral)  Ht 5' 1.5" (1.562 m)  Wt 176 lb (79.833 kg)  BMI 32.72 kg/m2 Last Weight:  Wt Readings from Last 1 Encounters:  08/29/12 176 lb (79.833 kg)   Last Height:   Ht Readings from Last 1 Encounters:  08/29/12 5' 1.5" (1.562 m)     Physical exam:  General: The patient is awake, alert and appears not in acute distress. The patient is well groomed. Head: Normocephalic, atraumatic. Neck is supple. Mallampati  1, neck circumference:14.5 inches  Cardiovascular:  Regular rate and rhythm, without  murmurs or carotid bruit, and without distended neck veins. Respiratory: Lungs are clear to auscultation. Skin:  Without evidence of edema, or rash Trunk:  Abdominal weight is still present, but she lost 50 pounds . Neurologic exam : The patient is awake and alert, oriented to place and time.  Memory subjective  described as intact. There is a normal attention span & concentration ability.  Speech is fluent without dysarthria, dysphonia or aphasia. Mood and affect are appropriate.  Cranial nerves: Pupils are  equal and briskly reactive to light. Funduscopic exam without  evidence of pallor or edema. Extraocular movements  in vertical and horizontal planes intact and without nystagmus. Visual fields by finger perimetry are intact. Hearing to finger rub intact.  Facial sensation intact to fine touch. Facial motor strength is symmetric and tongue and uvula move midline.  Motor exam:   Normal tone and normal muscle bulk and symmetric normal strength in all extremities.  Sensory:  Fine touch, pinprick and vibration were tested in all extremities. Proprioception is tested in the upper extremities only. This was  normal.  Coordination: Rapid alternating movements in the fingers/hands is tested and normal. Finger-to-nose maneuver tested and normal without evidence of ataxia, dysmetria or tremor.  Gait and station: Patient walks without assistive device and is able and assisted stool climb up to the exam table. Strength within normal limits.  Deep tendon reflexes: in the  upper and lower extremities are symmetric and intact.    Assessment:  After physical and neurologic examination, review of sleep   testing and pre-existing records, assessment is that of obstructive sleep apnea with a strong positional component. It is likely that the patient's baseline AHI has been reduced with her weight loss efforts. I will order an ultra titration for 30 days to live he can reset pressure if needed. I also would like her to try a nasal pillow- for example a pilairo type  .   Plan:  Treatment plan and additional  workup will be reviewed under Problem List. Titration pressure  window  between 5 and 12 cm water . For 30 days. After she has recovered from her surgery , will revisit and  refit for a mask.

## 2012-08-29 NOTE — Assessment & Plan Note (Signed)
Obstructive sleep apnea with supine and REM sleep accentuation. At the time the patient had a body mass index of 42, AHI of 30.9 RDI 31 REM AHI 61- Sleep  study date was 09-26-09 referring physician was Dr. Nadyne Coombes.  2 weight loss - patient may require lower pressures to overcome her apnea, I ordered an auto- titration. 30 days with a pressure window between 6 and 12 cm water. I also asked DME to refit for a nasal mask. And I need a download of data between June 2013 and 2014. CMS .

## 2012-09-01 ENCOUNTER — Telehealth: Payer: Self-pay | Admitting: *Deleted

## 2012-09-01 NOTE — Telephone Encounter (Signed)
I received a voicemail message and called patient.  She reports that she has no questions from [facility] on 08/27/12.  We reviewed her scheduled appointments. There was some confusion about a posted appointment with Dr. Carolynne Edouard at [facility] on 09/17/12.  I told patient I would clarify with Dr. Jamse Mead office and call her back.  I spoke with Judeth Cornfield at [facility] who said the appointment was in error and that she would schedule patient for a post operative follow up appointment.  Awaiting appointment date and time and will call patient.

## 2012-09-01 NOTE — Telephone Encounter (Signed)
Called and spoke with patient concerning her [facility] appt. From 08/27/12.  Patient denies any questions or concerns at this time.  Instructions and contact information given.

## 2012-09-01 NOTE — Telephone Encounter (Signed)
I called patient to f/u on [facility] 08/27/12.   Voicemail message left with contact information.

## 2012-09-02 ENCOUNTER — Telehealth: Payer: Self-pay | Admitting: *Deleted

## 2012-09-02 ENCOUNTER — Encounter (HOSPITAL_BASED_OUTPATIENT_CLINIC_OR_DEPARTMENT_OTHER)
Admission: RE | Admit: 2012-09-02 | Discharge: 2012-09-02 | Disposition: A | Discharge status: Home / Self Care | Payer: Medicare Other | Source: Ambulatory Visit | Attending: General Surgery | Admitting: General Surgery

## 2012-09-02 DIAGNOSIS — Z79899 Other long term (current) drug therapy: Secondary | ICD-10-CM | POA: Diagnosis not present

## 2012-09-02 DIAGNOSIS — D219 Benign neoplasm of connective and other soft tissue, unspecified: Secondary | ICD-10-CM | POA: Diagnosis not present

## 2012-09-02 DIAGNOSIS — Z7982 Long term (current) use of aspirin: Secondary | ICD-10-CM | POA: Diagnosis not present

## 2012-09-02 DIAGNOSIS — Z853 Personal history of malignant neoplasm of breast: Secondary | ICD-10-CM | POA: Diagnosis not present

## 2012-09-02 DIAGNOSIS — M129 Arthropathy, unspecified: Secondary | ICD-10-CM | POA: Diagnosis not present

## 2012-09-02 DIAGNOSIS — I10 Essential (primary) hypertension: Secondary | ICD-10-CM | POA: Diagnosis not present

## 2012-09-02 DIAGNOSIS — J45909 Unspecified asthma, uncomplicated: Secondary | ICD-10-CM | POA: Diagnosis not present

## 2012-09-02 DIAGNOSIS — G4733 Obstructive sleep apnea (adult) (pediatric): Secondary | ICD-10-CM | POA: Diagnosis not present

## 2012-09-02 DIAGNOSIS — E785 Hyperlipidemia, unspecified: Secondary | ICD-10-CM | POA: Diagnosis not present

## 2012-09-02 DIAGNOSIS — N8500 Endometrial hyperplasia, unspecified: Secondary | ICD-10-CM | POA: Diagnosis not present

## 2012-09-02 DIAGNOSIS — E669 Obesity, unspecified: Secondary | ICD-10-CM | POA: Diagnosis not present

## 2012-09-02 DIAGNOSIS — Z91040 Latex allergy status: Secondary | ICD-10-CM | POA: Diagnosis not present

## 2012-09-02 DIAGNOSIS — E119 Type 2 diabetes mellitus without complications: Secondary | ICD-10-CM | POA: Diagnosis not present

## 2012-09-02 DIAGNOSIS — M19049 Primary osteoarthritis, unspecified hand: Secondary | ICD-10-CM | POA: Diagnosis not present

## 2012-09-02 DIAGNOSIS — Z170 Estrogen receptor positive status [ER+]: Secondary | ICD-10-CM | POA: Diagnosis not present

## 2012-09-02 DIAGNOSIS — C50919 Malignant neoplasm of unspecified site of unspecified female breast: Secondary | ICD-10-CM | POA: Diagnosis not present

## 2012-09-02 LAB — BASIC METABOLIC PANEL
BUN: 11 mg/dL (ref 6–23)
Calcium: 10.1 mg/dL (ref 8.4–10.5)
Chloride: 103 mEq/L (ref 96–112)
Creatinine, Ser: 0.65 mg/dL (ref 0.50–1.10)
GFR calc Af Amer: >90 mL/min (ref >90)

## 2012-09-02 NOTE — Telephone Encounter (Signed)
I called patient to inform her of appointment scheduled for post op check with Dr. Johna Sheriff.  Patient has conflict with another medical appointment.  I called Dr. Jamse Mead office and they are to reschedule and notify patient of new appointment.

## 2012-09-03 ENCOUNTER — Encounter (HOSPITAL_BASED_OUTPATIENT_CLINIC_OR_DEPARTMENT_OTHER): Payer: Self-pay | Admitting: Anesthesiology

## 2012-09-03 ENCOUNTER — Encounter (HOSPITAL_BASED_OUTPATIENT_CLINIC_OR_DEPARTMENT_OTHER)
Admission: RE | Disposition: A | Discharge status: Home / Self Care | Payer: Self-pay | Source: Ambulatory Visit | Attending: General Surgery

## 2012-09-03 ENCOUNTER — Ambulatory Visit (HOSPITAL_BASED_OUTPATIENT_CLINIC_OR_DEPARTMENT_OTHER)
Admission: RE | Admit: 2012-09-03 | Discharge: 2012-09-03 | Disposition: A | Discharge status: Home / Self Care | Payer: Medicare Other | Source: Ambulatory Visit | Attending: General Surgery | Admitting: General Surgery

## 2012-09-03 ENCOUNTER — Ambulatory Visit (HOSPITAL_BASED_OUTPATIENT_CLINIC_OR_DEPARTMENT_OTHER): Payer: Medicare Other | Admitting: Anesthesiology

## 2012-09-03 ENCOUNTER — Encounter (HOSPITAL_COMMUNITY)
Admission: RE | Admit: 2012-09-03 | Discharge: 2012-09-03 | Disposition: A | Discharge status: Home / Self Care | Payer: Medicare Other | Source: Ambulatory Visit | Attending: General Surgery | Admitting: General Surgery

## 2012-09-03 ENCOUNTER — Encounter (HOSPITAL_BASED_OUTPATIENT_CLINIC_OR_DEPARTMENT_OTHER): Payer: Self-pay | Admitting: *Deleted

## 2012-09-03 ENCOUNTER — Ambulatory Visit: Payer: Medicare Other | Admitting: Neurology

## 2012-09-03 DIAGNOSIS — Z7982 Long term (current) use of aspirin: Secondary | ICD-10-CM | POA: Insufficient documentation

## 2012-09-03 DIAGNOSIS — M19049 Primary osteoarthritis, unspecified hand: Secondary | ICD-10-CM | POA: Insufficient documentation

## 2012-09-03 DIAGNOSIS — D4860 Neoplasm of uncertain behavior of unspecified breast: Secondary | ICD-10-CM | POA: Diagnosis not present

## 2012-09-03 DIAGNOSIS — C50112 Malignant neoplasm of central portion of left female breast: Secondary | ICD-10-CM

## 2012-09-03 DIAGNOSIS — G4733 Obstructive sleep apnea (adult) (pediatric): Secondary | ICD-10-CM | POA: Insufficient documentation

## 2012-09-03 DIAGNOSIS — I10 Essential (primary) hypertension: Secondary | ICD-10-CM | POA: Insufficient documentation

## 2012-09-03 DIAGNOSIS — C50919 Malignant neoplasm of unspecified site of unspecified female breast: Secondary | ICD-10-CM | POA: Diagnosis not present

## 2012-09-03 DIAGNOSIS — E119 Type 2 diabetes mellitus without complications: Secondary | ICD-10-CM | POA: Insufficient documentation

## 2012-09-03 DIAGNOSIS — Z91040 Latex allergy status: Secondary | ICD-10-CM | POA: Insufficient documentation

## 2012-09-03 DIAGNOSIS — D0590 Unspecified type of carcinoma in situ of unspecified breast: Secondary | ICD-10-CM | POA: Diagnosis not present

## 2012-09-03 DIAGNOSIS — M129 Arthropathy, unspecified: Secondary | ICD-10-CM | POA: Insufficient documentation

## 2012-09-03 DIAGNOSIS — Z170 Estrogen receptor positive status [ER+]: Secondary | ICD-10-CM | POA: Diagnosis not present

## 2012-09-03 DIAGNOSIS — Z79899 Other long term (current) drug therapy: Secondary | ICD-10-CM | POA: Insufficient documentation

## 2012-09-03 DIAGNOSIS — J45909 Unspecified asthma, uncomplicated: Secondary | ICD-10-CM | POA: Insufficient documentation

## 2012-09-03 DIAGNOSIS — E669 Obesity, unspecified: Secondary | ICD-10-CM | POA: Insufficient documentation

## 2012-09-03 DIAGNOSIS — N8500 Endometrial hyperplasia, unspecified: Secondary | ICD-10-CM | POA: Insufficient documentation

## 2012-09-03 DIAGNOSIS — D219 Benign neoplasm of connective and other soft tissue, unspecified: Secondary | ICD-10-CM | POA: Insufficient documentation

## 2012-09-03 DIAGNOSIS — E785 Hyperlipidemia, unspecified: Secondary | ICD-10-CM | POA: Insufficient documentation

## 2012-09-03 HISTORY — PX: BREAST LUMPECTOMY WITH NEEDLE LOCALIZATION AND AXILLARY SENTINEL LYMPH NODE BX: SHX5760

## 2012-09-03 HISTORY — DX: Personal history of other diseases of the female genital tract: Z87.42

## 2012-09-03 HISTORY — DX: Headache: R51

## 2012-09-03 HISTORY — DX: Urgency of urination: R39.15

## 2012-09-03 HISTORY — DX: Other seasonal allergic rhinitis: J30.2

## 2012-09-03 HISTORY — DX: Dental restoration status: Z98.811

## 2012-09-03 HISTORY — DX: Anemia, unspecified: D64.9

## 2012-09-03 SURGERY — BREAST LUMPECTOMY WITH NEEDLE LOCALIZATION AND AXILLARY SENTINEL LYMPH NODE BX
Anesthesia: General | Site: Breast | Laterality: Left | Wound class: Clean

## 2012-09-03 MED ORDER — HYDROMORPHONE HCL PF 1 MG/ML IJ SOLN: 0.2500 mg | INTRAMUSCULAR | Status: DC | PRN

## 2012-09-03 MED ORDER — TECHNETIUM TC 99M SULFUR COLLOID FILTERED
1.0000 | Freq: Once | INTRAVENOUS | Status: AC | PRN
  Administered 2012-09-03: 1 via INTRADERMAL

## 2012-09-03 MED ORDER — SODIUM CHLORIDE 0.9 % IJ SOLN
INTRAMUSCULAR | Status: DC | PRN
  Administered 2012-09-03: 11:00:00 via INTRAMUSCULAR

## 2012-09-03 MED ORDER — BUPIVACAINE-EPINEPHRINE 0.25% -1:200000 IJ SOLN
INTRAMUSCULAR | Status: DC | PRN
  Administered 2012-09-03: 29 mL

## 2012-09-03 MED ORDER — LACTATED RINGERS IV SOLN
INTRAVENOUS | Status: DC
  Administered 2012-09-03 (×2): via INTRAVENOUS

## 2012-09-03 MED ORDER — DEXAMETHASONE SODIUM PHOSPHATE 4 MG/ML IJ SOLN
INTRAMUSCULAR | Status: DC | PRN
  Administered 2012-09-03: 10 mg via INTRAVENOUS

## 2012-09-03 MED ORDER — OXYCODONE HCL 5 MG/5ML PO SOLN: 5.0000 mg | Freq: Once | ORAL | Status: DC | PRN

## 2012-09-03 MED ORDER — FENTANYL CITRATE 0.05 MG/ML IJ SOLN
INTRAMUSCULAR | Status: DC | PRN
  Administered 2012-09-03: 50 ug via INTRAVENOUS
  Administered 2012-09-03 (×2): 25 ug via INTRAVENOUS

## 2012-09-03 MED ORDER — OXYCODONE-ACETAMINOPHEN 5-325 MG PO TABS: 1.0000 | ORAL_TABLET | ORAL | Status: DC | PRN

## 2012-09-03 MED ORDER — FENTANYL CITRATE 0.05 MG/ML IJ SOLN
50.0000 ug | INTRAMUSCULAR | Status: DC | PRN
  Administered 2012-09-03: 50 ug via INTRAVENOUS

## 2012-09-03 MED ORDER — LIDOCAINE HCL (CARDIAC) 20 MG/ML IV SOLN
INTRAVENOUS | Status: DC | PRN
  Administered 2012-09-03: 50 mg via INTRAVENOUS

## 2012-09-03 MED ORDER — CHLORHEXIDINE GLUCONATE 4 % EX LIQD: 1.0000 "application " | Freq: Once | CUTANEOUS | Status: DC

## 2012-09-03 MED ORDER — MIDAZOLAM HCL 2 MG/2ML IJ SOLN
1.0000 mg | INTRAMUSCULAR | Status: DC | PRN
  Administered 2012-09-03: 1 mg via INTRAVENOUS

## 2012-09-03 MED ORDER — OXYCODONE HCL 5 MG PO TABS: 5.0000 mg | ORAL_TABLET | Freq: Once | ORAL | Status: DC | PRN

## 2012-09-03 MED ORDER — ONDANSETRON HCL 4 MG/2ML IJ SOLN
INTRAMUSCULAR | Status: DC | PRN
  Administered 2012-09-03: 4 mg via INTRAVENOUS

## 2012-09-03 MED ORDER — CEFAZOLIN SODIUM-DEXTROSE 2-3 GM-% IV SOLR
2.0000 g | INTRAVENOUS | Status: AC
Start: 2012-09-03 — End: 2012-09-03
  Administered 2012-09-03: 2 g via INTRAVENOUS

## 2012-09-03 MED ORDER — MIDAZOLAM HCL 5 MG/5ML IJ SOLN
INTRAMUSCULAR | Status: DC | PRN
  Administered 2012-09-03: 1 mg via INTRAVENOUS

## 2012-09-03 MED ORDER — MIDAZOLAM HCL 2 MG/ML PO SYRP: 12.0000 mg | ORAL_SOLUTION | Freq: Once | ORAL | Status: DC | PRN

## 2012-09-03 MED ORDER — PROPOFOL 10 MG/ML IV BOLUS
INTRAVENOUS | Status: DC | PRN
  Administered 2012-09-03: 150 mg via INTRAVENOUS

## 2012-09-03 SURGICAL SUPPLY — 56 items
APPLIER CLIP 11 MED OPEN (CLIP)
BINDER BREAST XLRG (GAUZE/BANDAGES/DRESSINGS) ×2 IMPLANT
BLADE SURG 10 STRL SS (BLADE) ×2 IMPLANT
BLADE SURG 15 STRL LF DISP TIS (BLADE) ×1 IMPLANT
BLADE SURG 15 STRL SS (BLADE) ×1
CANISTER SUCTION 1200CC (MISCELLANEOUS) ×2 IMPLANT
CHLORAPREP W/TINT 26ML (MISCELLANEOUS) ×2 IMPLANT
CLIP APPLIE 11 MED OPEN (CLIP) IMPLANT
CLIP TI WIDE RED SMALL 6 (CLIP) ×2 IMPLANT
CLOTH BEACON ORANGE TIMEOUT ST (SAFETY) ×2 IMPLANT
COVER MAYO STAND STRL (DRAPES) ×2 IMPLANT
COVER PROBE W GEL 5X96 (DRAPES) ×2 IMPLANT
COVER TABLE BACK 60X90 (DRAPES) ×2 IMPLANT
DECANTER SPIKE VIAL GLASS SM (MISCELLANEOUS) IMPLANT
DERMABOND ADVANCED (GAUZE/BANDAGES/DRESSINGS) ×1
DERMABOND ADVANCED .7 DNX12 (GAUZE/BANDAGES/DRESSINGS) ×1 IMPLANT
DEVICE DUBIN W/COMP PLATE 8390 (MISCELLANEOUS) ×2 IMPLANT
DRAIN CHANNEL 19F RND (DRAIN) IMPLANT
DRAIN HEMOVAC 1/8 X 5 (WOUND CARE) IMPLANT
DRAPE LAPAROSCOPIC ABDOMINAL (DRAPES) ×2 IMPLANT
DRAPE UTILITY XL STRL (DRAPES) ×2 IMPLANT
ELECT COATED BLADE 2.86 ST (ELECTRODE) ×2 IMPLANT
ELECT REM PT RETURN 9FT ADLT (ELECTROSURGICAL) ×2
ELECTRODE REM PT RTRN 9FT ADLT (ELECTROSURGICAL) ×1 IMPLANT
EVACUATOR SILICONE 100CC (DRAIN) IMPLANT
GLOVE BIO SURGEON STRL SZ7 (GLOVE) ×2 IMPLANT
GLOVE BIOGEL PI IND STRL 8 (GLOVE) ×1 IMPLANT
GLOVE BIOGEL PI INDICATOR 8 (GLOVE) ×1
GLOVE SS BIOGEL STRL SZ 7.5 (GLOVE) ×1 IMPLANT
GLOVE SUPERSENSE BIOGEL SZ 7.5 (GLOVE) ×1
GOWN PREVENTION PLUS XLARGE (GOWN DISPOSABLE) IMPLANT
GOWN PREVENTION PLUS XXLARGE (GOWN DISPOSABLE) ×4 IMPLANT
KIT MARKER MARGIN INK (KITS) ×2 IMPLANT
NDL SAFETY ECLIPSE 18X1.5 (NEEDLE) ×1 IMPLANT
NEEDLE HYPO 18GX1.5 SHARP (NEEDLE) ×1
NEEDLE HYPO 25X1 1.5 SAFETY (NEEDLE) ×4 IMPLANT
NS IRRIG 1000ML POUR BTL (IV SOLUTION) ×2 IMPLANT
PACK BASIN DAY SURGERY FS (CUSTOM PROCEDURE TRAY) ×2 IMPLANT
PAD ALCOHOL SWAB (MISCELLANEOUS) ×2 IMPLANT
PENCIL BUTTON HOLSTER BLD 10FT (ELECTRODE) ×2 IMPLANT
PIN SAFETY STERILE (MISCELLANEOUS) IMPLANT
SLEEVE SCD COMPRESS KNEE MED (MISCELLANEOUS) ×2 IMPLANT
SPONGE LAP 18X18 X RAY DECT (DISPOSABLE) IMPLANT
SPONGE LAP 4X18 X RAY DECT (DISPOSABLE) ×2 IMPLANT
SUT ETHILON 3 0 FSL (SUTURE) IMPLANT
SUT MON AB 4-0 PC3 18 (SUTURE) IMPLANT
SUT MON AB 5-0 PS2 18 (SUTURE) ×2 IMPLANT
SUT SILK 3 0 SH 30 (SUTURE) IMPLANT
SUT VIC AB 3-0 SH 27 (SUTURE)
SUT VIC AB 3-0 SH 27X BRD (SUTURE) IMPLANT
SUT VICRYL 3-0 CR8 SH (SUTURE) IMPLANT
SYR CONTROL 10ML LL (SYRINGE) ×4 IMPLANT
TOWEL OR 17X24 6PK STRL BLUE (TOWEL DISPOSABLE) ×2 IMPLANT
TOWEL OR NON WOVEN STRL DISP B (DISPOSABLE) IMPLANT
TUBE CONNECTING 20X1/4 (TUBING) ×2 IMPLANT
YANKAUER SUCT BULB TIP NO VENT (SUCTIONS) ×2 IMPLANT

## 2012-09-03 NOTE — H&P (View-Only) (Signed)
Subjective:      Patient ID: [name], female   DOB: 03/04/1946, 66 y.o.   MRN: [id number]  HPI Patient is a very pleasant 66-year-old female referred by Dr. Cornella for a new diagnosis of left breast cancer. She was recently recalled for short-term followup due to a questionably abnormal mammogram. Questionable mass was seen just deep to the nipple in the upper breast. Left breast ultrasound revealed a hypoechoic mass at the 12:00 position 1 cm from the nipple measuring 8 mm in diameter. There was a cystic-appearing mass deeper in the left breast as well. Ultrasound-guided core biopsy was recommended and performed. This has revealed invasive mammary carcinoma with lobular features, ER PR positive, HER-2/neu-positive with a proliferation marker a 58%. Of note is the more posterior lesion in the breast was felt to be a simple cyst on ultrasound. Subsequent breast MR has been performed showing an irregular mass with spiculated margins in the area of the known cancer with a marker clip which measures 17 x 15 mm. No other abnormalities were seen. The patient is seen today in the multidisciplinary clinic for treatment planning. She has not been able to feel a mass in her breast either before or since her diagnosis. She denies nipple bleeding or discharge or skin changes. She has no previous history of breast disease or breast biopsies. No significant family history of breast cancer.  Past Medical History  Diagnosis Date  . Postmenopausal bleeding   . Fibroids   . Diabetes mellitus   . Diverticulitis   . Endometrial hyperplasia   . Environmental allergies   . OSA on CPAP   . Hypertension   . White coat hypertension   . Hyperlipidemia   . Obesity   . Sleep apnea   . Osteoarthritis of hand BILATERAL HANDS  . Arthritis SHOULDERS, BACK  . Asthma with allergic rhinitis     local honey  . Breast cancer    Past Surgical History  Procedure Laterality Date  . Hysteroscopy w/d&c  X3 LAST ONE  05-07-1999  . Exploratory laparotomy  06-30-2007    ATTEMPTED HYSTERECTOMY ABORTED DUE TO EXTENSIVE ENDOMETRIOSIS W/ DENSE PELVIC ADHESIONS INVOLVING UTERUS AND LOWER RECTOSIGMOID  . Cholecystectomy  1995  . Back surgery  1997  . Dilation and curettage of uterus  2005  . Dilation and curettage of uterus  06/22/2011    Procedure: DILATATION AND CURETTAGE;  Surgeon: Daniel L Clarke-Pearson, MD;  Location: [facility];  Service: Gynecology;  Laterality: N/A;  OK PER KEELA FOR 7:15 START  . Back surgery  1997  . Dilation and curettage of uterus  2013  . Colonoscopy w/ polypectomy  03/20/2007    colon polyps.    . Breast biopsy  08/18/12    left breast   Current Outpatient Prescriptions  Medication Sig Dispense Refill  . ALBUTEROL IN Inhale into the lungs as needed.      . aspirin 81 MG tablet Take 81 mg by mouth daily.      . Cholecalciferol (VITAMIN D-3 PO) Take by mouth daily.      . Cyanocobalamin (VITAMIN B-12 CR PO) Take 500 mcg by mouth daily. Unsure of dose      . fluticasone (FLONASE) 50 MCG/ACT nasal spray Place 1 spray into the nose daily.  16 g  6  . fluticasone (FLONASE) 50 MCG/ACT nasal spray Place 4 sprays into the nose daily.  16 g  12  . lisinopril-hydrochlorothiazide (PRINZIDE,ZESTORETIC) 10-12.5 MG per tablet Take   0.5 tablets by mouth daily.      . loratadine (CLARITIN) 10 MG tablet Take 10 mg by mouth as needed.      . medroxyPROGESTERone (PROVERA) 10 MG tablet Take 10 mg by mouth daily. Takes two 5mg tablets      . metFORMIN (GLUCOPHAGE XR) 500 MG 24 hr tablet Take 1 tablet (500 mg total) by mouth 2 (two) times daily.  180 tablet  3  . Omega-3 Fatty Acids (FISH OIL) 1000 MG CAPS Take 1 capsule by mouth daily.      . pravastatin (PRAVACHOL) 40 MG tablet Take 20 mg by mouth every evening.      . vitamin C (ASCORBIC ACID) 500 MG tablet Take 1,000 mg by mouth daily.       . Zinc 50 MG TABS Take 1 tablet by mouth daily.      . [DISCONTINUED] LISINOPRIL PO Take 1  tablet by mouth daily.       No current facility-administered medications for this visit.   Allergies  Allergen Reactions  . Latex Itching and Other (See Comments)    Causes blisters   History  Substance Use Topics  . Smoking status: Never Smoker   . Smokeless tobacco: Not on file  . Alcohol Use: No     Review of Systems  Constitutional: Negative.   HENT: Negative.   Respiratory: Negative.   Cardiovascular: Negative.   Gastrointestinal: Negative.   Genitourinary: Positive for menstrual problem.  Hematological: Negative.        Objective:   Physical Exam Wt Readings from Last 3 Encounters:  08/27/12 178 lb 3.2 oz (80.831 kg)  08/22/12 173 lb 8 oz (78.699 kg)  05/05/12 172 lb 6.4 oz (78.2 kg)   Temp Readings from Last 3 Encounters:  08/27/12 98.6 F (37 C) Oral  08/22/12 97.9 F (36.6 C)   05/05/12 97.8 F (36.6 C) Oral   BP Readings from Last 3 Encounters:  08/27/12 144/77  08/22/12 148/62  05/05/12 148/74   Pulse Readings from Last 3 Encounters:  08/27/12 82  08/22/12 78  05/05/12 94   General: Alert, well-developed Caucasian female, in no distress Skin: Warm and dry without rash or infection. HEENT: No palpable masses or thyromegaly. Sclera nonicteric. Pupils equal round and reactive. Oropharynx clear. Lymph nodes: No cervical, supraclavicular, or inguinal nodes palpable. Breasts: Minimal induration in the upper left breast at the site of her previous biopsy. No discrete masses in either breast. The skin changes or nipple crusting or discharge. No palpable axillary adenopathy. Lungs: Breath sounds clear and equal without increased work of breathing Cardiovascular: Regular rate and rhythm without murmur. No JVD or edema. Peripheral pulses intact. Abdomen: Nondistended. Soft and nontender. No masses palpable. No organomegaly. No palpable hernias. Extremities: No edema or joint swelling or deformity. No chronic venous stasis changes. Neurologic: Alert and  fully oriented. Gait normal.    Assessment:     New diagnosis of clinical T1 C. N0 ER PR positive, HER-2 positive, invasive lobular carcinoma of the left breast. We discussed initial surgical treatment options in detail. She would appear to be a good candidate for breast conservation which is what she would prefer. We discussed the option of mastectomy but she prefers breast conservation. I would recommend a sentinel lymph node biopsy as well. We discussed the indications for the procedure, recovery, risks of bleeding, infection, anesthetic complications and possible need for further surgery based on final pathology findings. She understands that she would need breast radiation.   All her questions were answered.    Plan:     Left breast needle localized lumpectomy and axillary sentinel lymph node biopsy as an outpatient under general anesthesia. We may want to consider overnight observation due to her history of sleep apnea I will discuss this with anesthesia. We will schedule this for as soon as possible. She is stopping her progesterone nail and we'll contact her gynecologist regarding IUD placement.      

## 2012-09-03 NOTE — Op Note (Signed)
Preoperative Diagnosis: left breast cancer  Postoprative Diagnosis: left breast cancer  Procedure: Procedure(s): BREAST LUMPECTOMY WITH NEEDLE LOCALIZATION, BLUE DYE INJECTION AND AXILLARY SENTINEL LYMPH NODE BX   Surgeon: Glenna Fellows T   Assistants: None  Anesthesia:  General LMA anesthesiaDiagnos  Indications:   Patient is a 67 year old female with a recent diagnosis of invasive lobular carcinoma at the 12:00 position of the left breast. This measures 8 mm by ultrasound at 1.7 cm by MRI. After discussion of the initial surgical treatment options and risks detailed extensively elsewhere we elected to proceed with needle localized lumpectomy and axillary sentinel lymph node biopsy.  Procedure Detail:  Following successful needle localization and following injection of 1 mCi of technetium sulfur colloid intradermally around the left nipple prior to the procedure the patient is brought to the operating room, placed in the supine position on the operating table, and general laryngeal mask anesthesia induced. Under sterile technique and after patient timeout and correct procedure verified I injected 5 cc of dilute methylene blue subcutaneously beneath the left nipple and massaged this for several minutes. The entire left breast chest and axilla were widely sterilely prepped and draped. Patient timeout and correct procedure were again verified. The mass was subtly palpable at the 12:00 position near the areolar border. I made a curvilinear incision along the areolar edge and dissection was carried down into the superficial subcutaneous tissue. The mass felt fairly superficial. Short skin and subcutaneous flaps were raised in all directions including back toward the nipple and the dissection deepened down into the breast tissue. The wire was brought into the wound. I then excised a generous specimen of breast tissue using cautery around the shaft and the tip of the wire and the palpable  abnormality. The specimen was removed and oriented with ink. Specimen mammography showed the clip and the wire in the paracentral the specimen. This was sent for permanent pathology. The soft tissue was infiltrated with Marcaine. Complete hemostasis was obtained with cautery. The breast and subcutaneous tissue were closed with interrupted Vicryl. The cavity was marked with clips prior to closure. Attention was then turned to the sentinel node. The neoprobe was used to localize a hot area in the left axilla and a small transverse incision made. Dissection was carried down through the subcutaneous tissue using cautery and the clavipectoral fascia incised and axilla entered. Using the neoprobe for guidance blunt dissection was used to dissect down onto a slightly enlarged blue lymph node with high counts. It was completely excised with cautery and ex vivo had counts of near 9000 and background in the axilla was less than 40. This was sent as left axillary hot blue sentinel lymph node. The soft tissue here was infiltrated with Marcaine and hemostasis assured. The deeper subcutaneous tissues were closed with interrupted 3-0 Vicryl. Both skin incisions were closed with subcuticular 4-0 Monocryl and Dermabond. Sponge needle and instrument counts were correct.  Estimated Blood Loss:  Minimal         Drains: None  Blood Given: none          Specimens: #1 left breast lumpectomy #2 left axillary sentinel lymph node        Complications:  * No complications entered in OR log *         Disposition: PACU - hemodynamically stable.         Condition: stable

## 2012-09-03 NOTE — Anesthesia Postprocedure Evaluation (Signed)
  Anesthesia Post-op Note  Patient: [name]  Procedure(s) Performed: Procedure(s): BREAST LUMPECTOMY WITH NEEDLE LOCALIZATION AND AXILLARY SENTINEL LYMPH NODE BX (Left)  Patient Location: PACU  Anesthesia Type:General  Level of Consciousness: awake, alert  and oriented  Airway and Oxygen Therapy: Patient Spontanous Breathing  Post-op Pain: mild  Post-op Assessment: Post-op Vital signs reviewed, Patient's Cardiovascular Status Stable, Respiratory Function Stable, Patent Airway and No signs of Nausea or vomiting  Post-op Vital Signs: Reviewed and stable  Complications: No apparent anesthesia complications

## 2012-09-03 NOTE — Transfer of Care (Signed)
Immediate Anesthesia Transfer of Care Note  Patient: [name]  Procedure(s) Performed: Procedure(s): BREAST LUMPECTOMY WITH NEEDLE LOCALIZATION AND AXILLARY SENTINEL LYMPH NODE BX (Left)  Patient Location: PACU  Anesthesia Type:General  Level of Consciousness: sedated  Airway & Oxygen Therapy: Patient Spontanous Breathing and Patient connected to face mask oxygen  Post-op Assessment: Report given to PACU RN and Post -op Vital signs reviewed and stable  Post vital signs: Reviewed and stable  Complications: No apparent anesthesia complications

## 2012-09-03 NOTE — Interval H&P Note (Signed)
History and Physical Interval Note:  09/03/2012 10:51 AM  [name]  has presented today for surgery, with the diagnosis of left breast cancer  The various methods of treatment have been discussed with the patient and family. After consideration of risks, benefits and other options for treatment, the patient has consented to  Procedure(s): BREAST LUMPECTOMY WITH NEEDLE LOCALIZATION AND AXILLARY SENTINEL LYMPH NODE BX (Left) as a surgical intervention .  The patient's history has been reviewed, patient examined, no change in status, stable for surgery.  I have reviewed the patient's chart and labs.  Questions were answered to the patient's satisfaction.     Cleophas Yoak T

## 2012-09-03 NOTE — Anesthesia Procedure Notes (Signed)
Procedure Name: LMA Insertion Date/Time: 09/03/2012 11:14 AM Performed by: Zenia Resides D Pre-anesthesia Checklist: Patient identified, Emergency Drugs available, Suction available and Patient being monitored Patient Re-evaluated:Patient Re-evaluated prior to inductionOxygen Delivery Method: Circle System Utilized Preoxygenation: Pre-oxygenation with 100% oxygen Intubation Type: IV induction Ventilation: Mask ventilation without difficulty LMA: LMA inserted LMA Size: 4.0 Number of attempts: 1 Airway Equipment and Method: bite block Placement Confirmation: positive ETCO2 and breath sounds checked- equal and bilateral Tube secured with: Tape Dental Injury: Teeth and Oropharynx as per pre-operative assessment

## 2012-09-03 NOTE — Anesthesia Preprocedure Evaluation (Signed)
Anesthesia Evaluation  Patient identified by MRN, date of birth, ID band Patient awake    Reviewed: Allergy & Precautions, H&P , NPO status , Patient's Chart, lab work & pertinent test results  Airway Mallampati: II TM Distance: >3 FB Neck ROM: Full    Dental no notable dental hx. (+) Teeth Intact and Dental Advisory Given   Pulmonary sleep apnea and Continuous Positive Airway Pressure Ventilation ,  breath sounds clear to auscultation  Pulmonary exam normal       Cardiovascular hypertension, On Medications Rhythm:Regular Rate:Normal     Neuro/Psych negative neurological ROS  negative psych ROS   GI/Hepatic negative GI ROS, Neg liver ROS,   Endo/Other  negative endocrine ROSneg diabetes  Renal/GU negative Renal ROS  negative genitourinary   Musculoskeletal   Abdominal   Peds  Hematology negative hematology ROS (+)   Anesthesia Other Findings   Reproductive/Obstetrics negative OB ROS                           Anesthesia Physical Anesthesia Plan  ASA: III  Anesthesia Plan: General   Post-op Pain Management:    Induction: Intravenous  Airway Management Planned: LMA  Additional Equipment:   Intra-op Plan:   Post-operative Plan: Extubation in OR  Informed Consent: I have reviewed the patients History and Physical, chart, labs and discussed the procedure including the risks, benefits and alternatives for the proposed anesthesia with the patient or authorized representative who has indicated his/her understanding and acceptance.   Dental advisory given  Plan Discussed with: CRNA  Anesthesia Plan Comments:         Anesthesia Quick Evaluation

## 2012-09-03 NOTE — Progress Notes (Signed)
Emotional support during breast injections °

## 2012-09-04 ENCOUNTER — Telehealth (INDEPENDENT_AMBULATORY_CARE_PROVIDER_SITE_OTHER): Payer: Self-pay

## 2012-09-04 ENCOUNTER — Encounter (HOSPITAL_BASED_OUTPATIENT_CLINIC_OR_DEPARTMENT_OTHER): Payer: Self-pay | Admitting: General Surgery

## 2012-09-04 NOTE — Telephone Encounter (Signed)
Pt calling in to get scheduled for her post op appt in 2 wks with Dr Johna Sheriff but he is not in the office. Pls call to schedule the appt.

## 2012-09-05 ENCOUNTER — Telehealth (INDEPENDENT_AMBULATORY_CARE_PROVIDER_SITE_OTHER): Payer: Self-pay | Admitting: General Surgery

## 2012-09-05 NOTE — Telephone Encounter (Signed)
Call the patient and discussed the pathology report 

## 2012-09-08 ENCOUNTER — Telehealth: Payer: Self-pay | Admitting: Gynecologic Oncology

## 2012-09-08 NOTE — Telephone Encounter (Signed)
Spoke with patient to see if she had been set up for IUD placement with Dr. Chevis Pretty.  Stating that she has an appointment this Thursday.  Instructed to call for any needs.  No concerns voiced.

## 2012-09-10 ENCOUNTER — Encounter (INDEPENDENT_AMBULATORY_CARE_PROVIDER_SITE_OTHER): Payer: Self-pay

## 2012-09-10 ENCOUNTER — Ambulatory Visit (INDEPENDENT_AMBULATORY_CARE_PROVIDER_SITE_OTHER): Payer: Medicare Other | Admitting: General Surgery

## 2012-09-10 ENCOUNTER — Encounter (INDEPENDENT_AMBULATORY_CARE_PROVIDER_SITE_OTHER): Payer: Self-pay | Admitting: General Surgery

## 2012-09-10 VITALS — BP 150/80 | HR 80 | Temp 97.8°F | Resp 14 | Ht 62.0 in | Wt 178.6 lb

## 2012-09-10 DIAGNOSIS — C50112 Malignant neoplasm of central portion of left female breast: Secondary | ICD-10-CM

## 2012-09-10 DIAGNOSIS — C50119 Malignant neoplasm of central portion of unspecified female breast: Secondary | ICD-10-CM

## 2012-09-10 NOTE — Progress Notes (Signed)
History: Patient returns for her first office visit following left breast lumpectomy and sentinel lymph node biopsy for T1 C. N0 invasive lobular cancer of the left breast.  She reports no problems and surgery. She has been exercising regularly. Denies pain or so or swelling or other concerns  Exam: She appears well. Her breast and axillary incisions are nicely healed without complication  We reviewed her pathology. The lymph node was negative. Her invasive lobular tumor measured 1.6 cm and was within 1 mm of one margin but each negative. She did have lobular carcinoma in situ present at 2 margins.  Assessment and plan: Doing well following left breast lumpectomy and axillary sentinel lymph node biopsy. Her invasive tumor is excised. She does not need excision of her lobular in situ disease I discussed this with her as being a somewhat general risk factor for future cancers in either breast that could be addressed with hormonal therapy. All her questions were answered. I will see her back in 4 weeks for more long-term postop check. She has appointments at the cancer Center in the next week or 2.

## 2012-09-11 ENCOUNTER — Encounter: Payer: Self-pay | Admitting: Family Medicine

## 2012-09-11 ENCOUNTER — Telehealth: Payer: Self-pay | Admitting: *Deleted

## 2012-09-11 ENCOUNTER — Encounter (INDEPENDENT_AMBULATORY_CARE_PROVIDER_SITE_OTHER): Payer: Medicare Other | Admitting: General Surgery

## 2012-09-11 DIAGNOSIS — D259 Leiomyoma of uterus, unspecified: Secondary | ICD-10-CM | POA: Diagnosis not present

## 2012-09-11 DIAGNOSIS — Z13820 Encounter for screening for osteoporosis: Secondary | ICD-10-CM | POA: Diagnosis not present

## 2012-09-11 DIAGNOSIS — N852 Hypertrophy of uterus: Secondary | ICD-10-CM | POA: Diagnosis not present

## 2012-09-11 NOTE — Telephone Encounter (Signed)
I called patient to f/u and check in following her lumpectomy on 09/03/12.  Patient reports that she is doing very well.  She says that she had no pain following surgery and has had a smooth recovery.  We reviewed her upcoming appointments.  Patient denies any questions or concerns at this time.  My contact information was given and she was encouraged to call for any needs.

## 2012-09-15 ENCOUNTER — Ambulatory Visit (INDEPENDENT_AMBULATORY_CARE_PROVIDER_SITE_OTHER): Payer: Medicare Other | Admitting: Family Medicine

## 2012-09-15 ENCOUNTER — Encounter: Payer: Self-pay | Admitting: Family Medicine

## 2012-09-15 VITALS — BP 130/62 | HR 68 | Temp 97.9°F | Resp 12 | Ht 61.5 in | Wt 176.2 lb

## 2012-09-15 DIAGNOSIS — E785 Hyperlipidemia, unspecified: Secondary | ICD-10-CM

## 2012-09-15 DIAGNOSIS — I10 Essential (primary) hypertension: Secondary | ICD-10-CM | POA: Diagnosis not present

## 2012-09-15 DIAGNOSIS — G4733 Obstructive sleep apnea (adult) (pediatric): Secondary | ICD-10-CM

## 2012-09-15 DIAGNOSIS — E7800 Pure hypercholesterolemia, unspecified: Secondary | ICD-10-CM

## 2012-09-15 DIAGNOSIS — Z0000 Encounter for general adult medical examination without abnormal findings: Secondary | ICD-10-CM

## 2012-09-15 DIAGNOSIS — E119 Type 2 diabetes mellitus without complications: Secondary | ICD-10-CM | POA: Diagnosis not present

## 2012-09-15 DIAGNOSIS — C50112 Malignant neoplasm of central portion of left female breast: Secondary | ICD-10-CM

## 2012-09-15 LAB — CBC WITH DIFFERENTIAL/PLATELET
Basophils Absolute: 0 10*3/uL (ref 0.0–0.1)
Basophils Relative: 1 % (ref 0–1)
Eosinophils Absolute: 0.2 10*3/uL (ref 0.0–0.7)
Eosinophils Relative: 3 % (ref 0–5)
HCT: 40.6 % (ref 36.0–46.0)
MCH: 29.4 pg (ref 26.0–34.0)
MCHC: 34.5 g/dL (ref 30.0–36.0)
MCV: 85.3 fL (ref 78.0–100.0)
Monocytes Absolute: 0.4 10*3/uL (ref 0.1–1.0)
Neutro Abs: 4 10*3/uL (ref 1.7–7.7)
RDW: 13.5 % (ref 11.5–15.5)

## 2012-09-15 LAB — LIPID PANEL
Cholesterol: 216 mg/dL — ABNORMAL HIGH (ref 0–200)
HDL: 49 mg/dL (ref >39)
LDL Cholesterol: 151 mg/dL — ABNORMAL HIGH (ref 0–99)
Triglycerides: 80 mg/dL (ref <150)
VLDL: 16 mg/dL (ref 0–40)

## 2012-09-15 LAB — COMPREHENSIVE METABOLIC PANEL
Alkaline Phosphatase: 101 U/L (ref 39–117)
BUN: 11 mg/dL (ref 6–23)
Creat: 0.62 mg/dL (ref 0.50–1.10)
Glucose, Bld: 91 mg/dL (ref 70–99)
Total Bilirubin: 0.5 mg/dL (ref 0.3–1.2)

## 2012-09-15 LAB — POCT URINALYSIS DIPSTICK
Ketones, UA: NEGATIVE
Protein, UA: NEGATIVE
pH, UA: 7.5

## 2012-09-15 MED ORDER — LISINOPRIL-HYDROCHLOROTHIAZIDE 10-12.5 MG PO TABS: 0.5000 | ORAL_TABLET | Freq: Every day | ORAL | Status: DC

## 2012-09-15 NOTE — Assessment & Plan Note (Signed)
Controlled; s/p recent follow-up with Dohmeiher.

## 2012-09-15 NOTE — Assessment & Plan Note (Signed)
Controlled; obtain labs; obtain u/a; refill provided.

## 2012-09-15 NOTE — Patient Instructions (Addendum)
1. Show Dr. Johna Sheriff your abdominal swelling. 2.  Ask Dr. Welton Flakes when you can schedule repeat colonoscopy.

## 2012-09-15 NOTE — Assessment & Plan Note (Signed)
Controlled; obtain labs; stopped Pravastatin in past month; if LDL elevated will start different statin.

## 2012-09-15 NOTE — Assessment & Plan Note (Signed)
Anticipatory guidance --- weight loss, continued exercise.  Pap smear UTD and managed per Lorenso Courier and Chestine Spore Pearson/gyn onc.  Mammogram UTD.  Bone density UTD.  Colonoscopy due this year and to discuss with [facility] of oncology.  Immunizations UTD.  Independent with ADLs; no hearing loss; low fall risk.  No evidence of depression. FULL CODE.

## 2012-09-15 NOTE — Assessment & Plan Note (Signed)
Controlled with diet; obtain labs.  Monitor sugars regularly with recent weight gain.  Monofilament normal; obtain urine microalbumin.

## 2012-09-15 NOTE — Assessment & Plan Note (Signed)
New.  S/p lumpectomy two weeks ago with good healing.  Appointment with oncology and radiation oncology in upcoming two weeks. Coping well with diagnosis and current treatment; good family and social support.

## 2012-09-15 NOTE — Progress Notes (Signed)
[address]   [place], [place]  [address]   [phone]  Subjective:    Patient ID: [name], female    DOB: [birth year], 67 y.o.   MRN: [id number]  HPI This 67 y.o. female presents for evaluation for Annual Wellness Exam.  Last physical  02/2011.. Pap smear [facility] 02/2011.  Followed by gynecological oncology; recommends pap smear every 3 years; followed by oncology every six months Loree Fee, MD in [facility]. Mammogram 09/11/2012. Bone density scan 09/11/2012. Colonoscopy 08/26/2007. Small polyps.  Repeat in 5 years.  Due this year.  . Tetanus 2007. Pneumovax 2011. Zostavax 2010. Flu vaccine 12/2011. Eye exam 12/2011 [place]. No glaucoma; early cataracts.  No diabetic retinopathy. Dental exam every six months  HTN: Lisinopril/HCTZ taking 1/2 daily; unable to check BP on left arm; 117/68.    OSA:  S/p follow-up with [facility] consultation; recommended follow-up in a few weeks; fitted for nasal CPAP.  Might need another sleep study.  DMII:  Checking sugar 91 this morning.  Has gained a little weight since last visit.  Hyperlipidemia:  Stopped Pravastatin one month ago due to expense.  Not sure if needs to switch to different medication.  Breast cancer:  S/p 09/03/12 lumpectomy with axillary node biopsy negative [facility]; to start radiation in upcoming weeks; meets with radiation oncology on 09/29/12; sees Welton Flakes on 09/23/12.  Has not decided on chemotherapy yet; to discuss further with Dr. Welton Flakes.   Review of Systems  Constitutional: Negative for fever, chills, diaphoresis, activity change, appetite change, fatigue and unexpected weight change.  HENT: Negative for hearing loss, ear pain, nosebleeds, congestion, sore throat, facial swelling, rhinorrhea, sneezing, drooling, mouth sores, trouble swallowing, neck pain, neck stiffness, dental problem, voice change, postnasal drip, sinus pressure, tinnitus and ear discharge.   Eyes: Negative for photophobia,  pain, discharge, redness, itching and visual disturbance.  Respiratory: Positive for apnea. Negative for cough, choking, chest tightness, shortness of breath, wheezing and stridor.   Cardiovascular: Negative for chest pain, palpitations and leg swelling.  Gastrointestinal: Positive for abdominal pain and abdominal distention. Negative for nausea, vomiting, diarrhea, constipation, blood in stool, anal bleeding and rectal pain.  Endocrine: Negative for cold intolerance, heat intolerance, polydipsia, polyphagia and polyuria.  Genitourinary: Negative for dysuria, urgency, frequency, hematuria, flank pain, decreased urine volume, vaginal bleeding, vaginal discharge, enuresis, difficulty urinating, genital sores, vaginal pain, pelvic pain and dyspareunia.  Musculoskeletal: Positive for back pain. Negative for myalgias, joint swelling, arthralgias and gait problem.  Skin: Negative for color change, pallor, rash and wound.  Allergic/Immunologic: Negative for environmental allergies, food allergies and immunocompromised state.  Neurological: Positive for headaches. Negative for dizziness, tremors, seizures, syncope, facial asymmetry, speech difficulty, weakness, light-headedness and numbness.  Hematological: Negative for adenopathy. Does not bruise/bleed easily.  Psychiatric/Behavioral: Negative for suicidal ideas, hallucinations, behavioral problems, confusion, sleep disturbance, self-injury, dysphoric mood, decreased concentration and agitation. The patient is not nervous/anxious and is not hyperactive.     Past Medical History  Diagnosis Date  . Postmenopausal bleeding   . Fibroids   . Headache(784.0)     tension  . Seasonal allergies   . Anemia     due to vaginal bleeding  . Urinary urgency   . Hypertension     under control with med., has been on med. x 3 yr.  . White coat hypertension   . History of endometriosis   . Breast cancer 08/2012    left  . Sleep apnea  uses CPAP nightly  .  Arthritis     hands  . Dental crowns present   . Hyperlipidemia     Past Surgical History  Procedure Laterality Date  . Hysteroscopy w/d&c  05/06/2009  . Exploratory laparotomy  06-30-2007    ATTEMPTED HYSTERECTOMY ABORTED DUE TO EXTENSIVE ENDOMETRIOSIS W/ DENSE PELVIC ADHESIONS INVOLVING UTERUS AND LOWER RECTOSIGMOID  . Cholecystectomy  1995  . Colonoscopy w/ polypectomy  03/20/2007  . Back surgery  1997  . Dilation and curettage of uterus  2005  . Dilation and curettage of uterus  06/22/2011    Procedure: DILATATION AND CURETTAGE;  Surgeon: Jeannette Corpus, MD;  Location: [facility];  Service: Gynecology;  Laterality: N/A;  OK PER KEELA FOR 7:15 START  . Breast lumpectomy with needle localization and axillary sentinel lymph node bx Left 09/03/2012    Procedure: BREAST LUMPECTOMY WITH NEEDLE LOCALIZATION AND AXILLARY SENTINEL LYMPH NODE BX;  Surgeon: Mariella Saa, MD;  Location: [facility];  Service: General;  Laterality: Left;    Prior to Admission medications   Medication Sig Start Date End Date Taking? Authorizing Provider  aspirin 81 MG tablet Take 81 mg by mouth daily.   Yes Historical Provider, MD  calcium citrate-vitamin D (CITRACAL+D) 315-200 MG-UNIT per tablet Take 1 tablet by mouth 2 (two) times daily. 600 mg. Calcium, 400 mg. Vit. D3   Yes Historical Provider, MD  Cyanocobalamin (VITAMIN B-12 CR PO) Take 500 mcg by mouth daily.    Yes Historical Provider, MD  fluticasone (FLONASE) 50 MCG/ACT nasal spray Place 4 sprays into the nose daily. 05/05/12  Yes Phillips Odor, MD  Omega-3 Fatty Acids (FISH OIL) 1000 MG CAPS Take 1 capsule by mouth daily.   Yes Historical Provider, MD  vitamin C (ASCORBIC ACID) 500 MG tablet Take 1,000 mg by mouth daily.    Yes Historical Provider, MD  Zinc 50 MG TABS Take 1 tablet by mouth daily.   Yes Historical Provider, MD  lisinopril-hydrochlorothiazide (PRINZIDE,ZESTORETIC) 10-12.5 MG per tablet Take 0.5  tablets by mouth daily. 09/15/12 10/07/13  Ethelda Chick, MD  medroxyPROGESTERone (PROVERA) 5 MG tablet  07/20/12   Historical Provider, MD  oxyCODONE-acetaminophen (ROXICET) 5-325 MG per tablet Take 1-2 tablets by mouth every 4 (four) hours as needed for pain. 09/03/12   Mariella Saa, MD    Allergies  Allergen Reactions  . Adhesive (Tape) Rash    History   Social History  . Marital Status: Married    Spouse Name: [name]    Number of Children: 4  . Years of Education: College   Occupational History  . retired    Social History Main Topics  . Smoking status: Never Smoker   . Smokeless tobacco: Never Used  . Alcohol Use: No  . Drug Use: No  . Sexually Active: Not on file   Other Topics Concern  . Not on file   Social History Narrative   Marital status: married x 47 years; happily married; no abuse.      Children: 4 children; six grandchildren.      Lives:  With husband.      Employed: retired in 2011; [employer].      Tobacco: none       Alcohol:  None      Drugs: none      Exercise:  Daily. Walking x 1 hour three days per week.      Seatbelt:  100%  Sunscreen:  Face SPF 15.      Guns:  Loaded mostly secured.      ADLs:  Drives; no assistant devices; independent with all ADLs.       Her nocturia is improved under CPAP use at 10 cm water- AHI is now 0.8 and from 30 at baseline. CMS compliance  6 hours 40 minutes - sleeps on the side, 08-10-11 .FFM - likes it.    . Flonase used prn.     Caffeine Use: 1 cup daily    Family History  Problem Relation Age of Onset  . Hypertension Other   . Vision loss Mother   . Hypertension Mother   . Anesthesia problems Mother     post-op N/V  . Heart disease Mother   . Osteoporosis Mother   . Depression Father   . Heart disease Sister   . Hypertension Sister   . Diabetes Brother   . Hypertension Brother   . Cancer Brother     skin  . Hyperlipidemia Brother   . COPD Daughter        Objective:    Physical Exam  Nursing note and vitals reviewed. Constitutional: She is oriented to person, place, and time. She appears well-developed and well-nourished. No distress.  HENT:  Head: Normocephalic and atraumatic.  Right Ear: External ear normal.  Nose: Nose normal.  Mouth/Throat: Oropharynx is clear and moist.  Eyes: Conjunctivae are normal. Pupils are equal, round, and reactive to light.  Neck: Normal range of motion. Neck supple. No JVD present. No thyromegaly present.  Cardiovascular: Normal rate, regular rhythm, normal heart sounds and intact distal pulses.  Exam reveals no gallop and no friction rub.   No murmur heard. Pulmonary/Chest: Effort normal and breath sounds normal.  Abdominal: Soft. Bowel sounds are normal. She exhibits no distension and no mass. There is no tenderness. There is no rebound and no guarding.  Musculoskeletal:       Right shoulder: Normal.       Left shoulder: Normal.       Cervical back: Normal.       Lumbar back: Normal.  Lymphadenopathy:    She has no cervical adenopathy.  Neurological: She is alert and oriented to person, place, and time. She has normal reflexes. No cranial nerve deficit. She exhibits normal muscle tone. Coordination normal.  Skin: Skin is warm and dry. No rash noted. She is not diaphoretic. No erythema. No pallor.  Diffuse sun related changes present throughout.    Psychiatric: She has a normal mood and affect. Her behavior is normal. Judgment and thought content normal.   Results for orders placed in visit on 09/15/12  POCT URINALYSIS DIPSTICK      Result Value Range   Color, UA yellow     Clarity, UA clear     Glucose, UA neg     Bilirubin, UA neg     Ketones, UA neg     Spec Grav, UA 1.020     Blood, UA neg     pH, UA 7.5     Protein, UA neg     Urobilinogen, UA 1.0     Nitrite, UA neg     Leukocytes, UA small (1+)         Assessment & Plan:  Essential hypertension, benign - Plan: POCT urinalysis dipstick, CBC with  Differential, CK, TSH, lisinopril-hydrochlorothiazide (PRINZIDE,ZESTORETIC) 10-12.5 MG per tablet, CANCELED: EKG 12-Lead  Pure hypercholesterolemia - Plan: Lipid panel  Type II or unspecified  type diabetes mellitus without mention of complication, not stated as uncontrolled - Plan: Hemoglobin A1c, Microalbumin, urine, CANCELED: POCT glycosylated hemoglobin (Hb A1C)  Medicare annual wellness visit, subsequent - Plan: Comprehensive metabolic panel, CANCELED: EKG 16-[id number]   Meds ordered this encounter  Medications  . lisinopril-hydrochlorothiazide (PRINZIDE,ZESTORETIC) 10-12.5 MG per tablet    Sig: Take 0.5 tablets by mouth daily.    Dispense:  90 tablet    Refill:  1

## 2012-09-17 ENCOUNTER — Ambulatory Visit (INDEPENDENT_AMBULATORY_CARE_PROVIDER_SITE_OTHER): Payer: Medicare Other | Admitting: General Surgery

## 2012-09-22 ENCOUNTER — Telehealth: Payer: Self-pay | Admitting: *Deleted

## 2012-09-22 NOTE — Telephone Encounter (Signed)
Called patient to check in and to remind her of her appointment with Dr. Welton Flakes tomorrow.  Left voicemail message with information.

## 2012-09-23 ENCOUNTER — Telehealth: Payer: Self-pay | Admitting: Oncology

## 2012-09-23 ENCOUNTER — Ambulatory Visit (HOSPITAL_BASED_OUTPATIENT_CLINIC_OR_DEPARTMENT_OTHER): Payer: Medicare Other | Admitting: Oncology

## 2012-09-23 ENCOUNTER — Encounter: Payer: Self-pay | Admitting: Family Medicine

## 2012-09-23 VITALS — BP 155/81 | HR 71 | Temp 97.8°F | Resp 20 | Ht 61.5 in | Wt 179.4 lb

## 2012-09-23 DIAGNOSIS — Z170 Estrogen receptor positive status [ER+]: Secondary | ICD-10-CM

## 2012-09-23 DIAGNOSIS — C50119 Malignant neoplasm of central portion of unspecified female breast: Secondary | ICD-10-CM | POA: Diagnosis not present

## 2012-09-23 DIAGNOSIS — N949 Unspecified condition associated with female genital organs and menstrual cycle: Secondary | ICD-10-CM

## 2012-09-23 DIAGNOSIS — C50112 Malignant neoplasm of central portion of left female breast: Secondary | ICD-10-CM

## 2012-09-23 NOTE — Patient Instructions (Addendum)
We will repeat the her2neu test on your tumor  We will begin zoladex q month beginning on 09/30/12  I will see you back in 1 week

## 2012-09-23 NOTE — Progress Notes (Signed)
OFFICE PROGRESS NOTE  [name],[name], MD [address] [place] [address] Dr. Glenna Fellows  Dr. Antony Blackbird  DIAGNOSIS: 67 year old female with new diagnosis of screen detected invasive mammary carcinoma with lobular features  STAGE:  Cancer of central portion of female breast  Primary site: Breast (Left)  Staging method: AJCC 7th Edition  Clinical: Stage IA (T1c, N0, cM0)  Summary: Stage IA (T1c, N0, cM0)  PRIOR THERAPY: #1Patient recently will underwent a screening mammogram and she was found to have a questionable mass just deep to the nipple in the superior. Periareolar region. Calcifications were present adjacent to but not within the mass. There was also noted to be focal abnormal density medially on the craniocaudal projection. Left breast ultrasound revealed a hypoechoic mass in the 12:00 position 1 cm from the nipple measuring 5 x 8 mm. There was also an ovoid cystic appearing lesion in the 11:00 position 2 cm from the nipple measuring 1.5 x 0.4 cm. Patient had a ultrasound guided core biopsy performed. The biopsy revealed invasive mammary carcinoma with mammary carcinoma in situ grade 2 with lobular features. Tumor was ER +100% PR +63% proliferation marker Ki-67 58% HER-2/neu showed amplification with a ratio of 2.48.  #2 Patient also had MRI of the breasts performed the MRI showed irregular enhancing mass within spiculated margins posterior and lateral to this mass wasn't immediately adjacent smaller satellite mass demonstrating similar features together the 2 masses measured 17 mm x 11 mm x 15 mm. There were no other abnormalities left axilla was negative  #3 patient is now status post lumpectomy of the left breast with sentinel lymph node biopsy the final pathology revealed a 1.6 cm grade 2 invasive lobular carcinoma, ER positive PR positive. HER-2/neu was not repeated and I have requested that the HER-2/neu status be repeated. The biopsy HER-2 was positive.    CURRENT THERAPY: undergone further workup  INTERVAL HISTORY: [name] 67 y.o. female returns for followup visit post lumpectomy. Overall she seems to be doing well without any significant problems. She denies any fevers chills night sweats headaches shortness of breath chest pains palpitations no myalgias and arthralgias. Her surgical site is healing well  MEDICAL HISTORY: Past Medical History  Diagnosis Date  . Postmenopausal bleeding   . Fibroids   . Headache(784.0)     tension  . Seasonal allergies   . Anemia     due to vaginal bleeding  . Urinary urgency   . Hypertension     under control with med., has been on med. x 3 yr.  . White coat hypertension   . History of endometriosis   . Breast cancer 08/2012    left  . Sleep apnea     uses CPAP nightly  . Arthritis     hands  . Dental crowns present   . Hyperlipidemia     ALLERGIES:  is allergic to adhesive.  MEDICATIONS:  Current Outpatient Prescriptions  Medication Sig Dispense Refill  . aspirin 81 MG tablet Take 81 mg by mouth daily.      . calcium citrate-vitamin D (CITRACAL+D) 315-200 MG-UNIT per tablet Take 1 tablet by mouth 2 (two) times daily. 600 mg. Calcium, 400 mg. Vit. D3      . Cyanocobalamin (VITAMIN B-12 CR PO) Take 500 mcg by mouth daily.       Marland Kitchen lisinopril-hydrochlorothiazide (PRINZIDE,ZESTORETIC) 10-12.5 MG per tablet Take 0.5 tablets by mouth daily.  90 tablet  1  . Omega-3 Fatty Acids (FISH  OIL) 1000 MG CAPS Take 1 capsule by mouth daily.      . vitamin C (ASCORBIC ACID) 500 MG tablet Take 1,000 mg by mouth daily.       . Zinc 50 MG TABS Take 1 tablet by mouth daily.      . fluticasone (FLONASE) 50 MCG/ACT nasal spray Place 4 sprays into the nose daily.  16 g  12  . oxyCODONE-acetaminophen (ROXICET) 5-325 MG per tablet Take 1-2 tablets by mouth every 4 (four) hours as needed for pain.  40 tablet  0  . [DISCONTINUED] LISINOPRIL PO Take 1 tablet by mouth daily.       No current  facility-administered medications for this visit.    SURGICAL HISTORY:  Past Surgical History  Procedure Laterality Date  . Hysteroscopy w/d&c  05/06/2009  . Exploratory laparotomy  06-30-2007    ATTEMPTED HYSTERECTOMY ABORTED DUE TO EXTENSIVE ENDOMETRIOSIS W/ DENSE PELVIC ADHESIONS INVOLVING UTERUS AND LOWER RECTOSIGMOID  . Cholecystectomy  1995  . Colonoscopy w/ polypectomy  03/20/2007  . Back surgery  1997  . Dilation and curettage of uterus  2005  . Dilation and curettage of uterus  06/22/2011    Procedure: DILATATION AND CURETTAGE;  Surgeon: Jeannette Corpus, MD;  Location: [facility];  Service: Gynecology;  Laterality: N/A;  OK PER KEELA FOR 7:15 START  . Breast lumpectomy with needle localization and axillary sentinel lymph node bx Left 09/03/2012    Procedure: BREAST LUMPECTOMY WITH NEEDLE LOCALIZATION AND AXILLARY SENTINEL LYMPH NODE BX;  Surgeon: Mariella Saa, MD;  Location: [facility];  Service: General;  Laterality: Left;    REVIEW OF SYSTEMS:  Pertinent items are noted in HPI.   HEALTH MAINTENANCE: PHYSICAL EXAMINATION: Blood pressure 155/81, pulse 71, temperature 97.8 F (36.6 C), temperature source Oral, resp. rate 20, height 5' 1.5" (1.562 m), weight 179 lb 6.4 oz (81.375 kg). Body mass index is 33.35 kg/(m^2). ECOG PERFORMANCE STATUS: 0 - Asymptomatic   General appearance: alert, cooperative and appears stated age Resp: clear to auscultation bilaterally Cardio: regular rate and rhythm GI: soft, non-tender; bowel sounds normal; no masses,  no organomegaly Extremities: extremities normal, atraumatic, no cyanosis or edema Neurologic: Grossly normal   LABORATORY DATA: Lab Results  Component Value Date   WBC 6.1 09/15/2012   HGB 14.0 09/15/2012   HCT 40.6 09/15/2012   MCV 85.3 09/15/2012   PLT 286 09/15/2012      Chemistry      Component Value Date/Time   NA 139 09/15/2012 0838   NA 138 08/27/2012 1219   K 4.3 09/15/2012  0838   K 3.7 08/27/2012 1219   CL 104 09/15/2012 0838   CL 104 08/27/2012 1219   CO2 25 09/15/2012 0838   CO2 27 08/27/2012 1219   BUN 11 09/15/2012 0838   BUN 12.6 08/27/2012 1219   CREATININE 0.62 09/15/2012 0838   CREATININE 0.65 09/02/2012 1213   CREATININE 0.8 08/27/2012 1219      Component Value Date/Time   CALCIUM 9.5 09/15/2012 0838   CALCIUM 10.0 08/27/2012 1219   ALKPHOS 101 09/15/2012 0838   ALKPHOS 100 08/27/2012 1219   AST 16 09/15/2012 0838   AST 14 08/27/2012 1219   ALT 15 09/15/2012 0838   ALT 12 08/27/2012 1219   BILITOT 0.5 09/15/2012 0838   BILITOT 0.67 08/27/2012 1219    Diagnosis 1. Breast, lumpectomy, Left - INVASIVE LOBULAR CARCINOMA, SEE COMMENT. - INVASIVE TUMOR IS LESS THAN 0.1MM FROM  NEAREST MARGIN (ANTERIOR). - LYMPHOVASCULAR INVASION IDENTIFIED. - LOBULAR CARCINOMA IN SITU. - IN SITU CARCINOMA IS PRESENT AT SUPERIOR AND INFERIOR MARGIN. - ATYPICAL DUCTAL HYPERPLASIA PRESENT - SEE TUMOR TEMPLATE BELOW. 2. Lymph node, sentinel, biopsy, Left axillary - ONE LYMPH NODE, NEGATIVE FOR TUMOR (0/1). - SEE COMMENT. Microscopic Comment 1. BREAST, INVASIVE TUMOR, WITH LYMPH NODE SAMPLING Specimen, including laterality: Left breast Procedure: Lumpectomy Grade: II of III Tubule formation: III Nuclear pleomorphism: II Mitotic:I Tumor size (gross measurement): 1.6 cm Margins: Invasive, distance to closest margin: Less than 0.75mm In-situ, distance to closest margin: Present at margin (see above) If margin positive, focally or broadly: Broadly Lymphovascular invasion: Present Ductal carcinoma in situ:None Grade: N/A Extensive intraductal component: N/A Lobular neoplasia: Present Tumor focality: Unifocal Treatment effect: None If present, treatment effect in breast tissue, lymph nodes or both: N/A 1 of 3 FINAL for [name], [name] ([id number]) Microscopic Comment(continued) Extent of tumor: Skin: N/A Nipple: N/A Skeletal muscle: N/A Lymph nodes: # examined:  1 Lymph nodes with metastasis: 0 Breast prognostic profile: Not repeated Estrogen receptor: Previous study demonstrated 100% positivity ([id number]) Progesterone receptor: Previous study demonstrated 63% positivity ([id number]) Her 2 neu: Not repeated, previous study demonstrated amplification (6.45) ([id number]) Ki-67: Not repeated, previous study demonstrated 58% proliferation rate ([id number]) Non-neoplastic breast: Previous biopsy site, fibrocystic change and microcalcifications TNM: pT1c, pN0, pMX Comments: None. 2. Cytokeratin AE1 / AE3 immunostain does not demonstrate any intranodal metastatic epithelial tumor deposits. (CRR:caf 09/04/12) [place] RUND DO Pathologist, Electronic Signature (Case signed 09/05/2012)    RADIOGRAPHIC STUDIES:  Mr Breast Bilateral W Wo Contrast  08/26/2012   *RADIOLOGY REPORT*  Clinical Data: recent diagnosis of in situ and invasive carcinoma left breast  BUN and creatinine were obtained on site at [facility] at [address] Results:  BUN 11 mg/dL,  Creatinine 0.9 mg/dL.  BILATERAL BREAST MRI WITH AND WITHOUT CONTRAST  Technique: Multiplanar, multisequence MR images of both breasts were obtained prior to and following the intravenous administration of 16ml of Multihance.  Three dimensional images were evaluated at the independent DynaCad workstation.  Comparison:  all recent images from [facility] performed June 2014  Findings: There is mild background parenchymal enhancement.  The right breast and axilla are negative.  On the left, there is an anterior biopsy marker clip in the 12 o'clock position.  This is associated with a irregular enhancing mass with spiculated margins.  Posterior and lateral to this mass is an immediately adjacent smaller satellite mass demonstrating similar features.  Together, these masses measure 17mm (AP) x 11mm (lateral) x 15mm (craniocaudal).  There are no other parencymal abnormalities.  The left axilla is  negative.  IMPRESSION: Dominant, biopsied mass with small immediately adjacent satellite mass consistent with biopsy results indicating malignancy, measuring 17 x 11 x 15mm.  BI-RADS CATEGORY 6:  Known biopsy-proven malignancy - appropriate action should be taken.  THREE-DIMENSIONAL MR IMAGE RENDERING ON INDEPENDENT WORKSTATION:  Three-dimensional MR images were rendered by post-processing of the original MR data on an independent workstation.  The three- dimensional MR images were interpreted, and findings were reported in the accompanying complete MRI report for this study.   Original Report Authenticated By: Esperanza Heir, M.D.   Nm Sentinel Node Inj-no Rpt (breast)  09/03/2012   CLINICAL DATA: cancer of the left breast   Sulfur colloid was injected intradermally by the nuclear medicine  technologist for breast cancer sentinel node localization.     ASSESSMENT: 67 year old female with  #1 stage I(  T1 C. N0) invasive lobular carcinoma of the left breast status post lumpectomy and sentinel lymph node biopsy. The final pathology reveals a ER/PR positive, repeat HER-2/neu pending with a Ki-67 of 58%. Patient's HER-2/neu on biopsy was positive. Question is whether to treat the patient with chemotherapy. However I have requested pathology to repeat the HER-2/neu on this particular patient to 2 with lobular features. If the HER-2/neu is negative then we will do an Oncotype DX testing to see if she indeed does need this chemotherapy versus going on to radiation and then antiestrogen therapy.  #2 patient also has significant dysfunctional uterine bleeding I have recommended that we try Zoladex to see if we can just stop the bleeding with this. She is not able to go on hormone replacement therapy due to her diagnosis of breast cancer. Question was whether she could have a IUD placed. If Zoladex does not work then it would be absolutely necessary from oncology perspective to have a IUD placed to help patient with  dysfunctional uterine bleeding.   PLAN:   #1 repeat HER-2/neu testing on final specimen.  #2 she will return in a few weeks time for followup   All questions were answered. The patient knows to call the clinic with any problems, questions or concerns. We can certainly see the patient much sooner if necessary.  I spent 25 minutes counseling the patient face to face. The total time spent in the appointment was 30 minutes.    Drue Second, MD Medical/Oncology [facility] [phone] (beeper) [phone] (Office)  09/23/2012, 12:11 PM

## 2012-09-23 NOTE — Telephone Encounter (Signed)
, °

## 2012-09-25 ENCOUNTER — Other Ambulatory Visit: Payer: Self-pay | Admitting: Dermatology

## 2012-09-25 DIAGNOSIS — L821 Other seborrheic keratosis: Secondary | ICD-10-CM | POA: Diagnosis not present

## 2012-09-25 DIAGNOSIS — L819 Disorder of pigmentation, unspecified: Secondary | ICD-10-CM | POA: Diagnosis not present

## 2012-09-25 DIAGNOSIS — D485 Neoplasm of uncertain behavior of skin: Secondary | ICD-10-CM | POA: Diagnosis not present

## 2012-09-25 DIAGNOSIS — D216 Benign neoplasm of connective and other soft tissue of trunk, unspecified: Secondary | ICD-10-CM | POA: Diagnosis not present

## 2012-09-25 DIAGNOSIS — L570 Actinic keratosis: Secondary | ICD-10-CM | POA: Diagnosis not present

## 2012-09-26 NOTE — Progress Notes (Signed)
Location of Breast Cancer: left central  Histology per Pathology Report:  Diagnosis 1. Breast, lumpectomy, Left - INVASIVE LOBULAR CARCINOMA, SEE COMMENT. - INVASIVE TUMOR IS LESS THAN 0.1MM FROM NEAREST MARGIN (ANTERIOR). - LYMPHOVASCULAR INVASION IDENTIFIED. - LOBULAR CARCINOMA IN SITU. - IN SITU CARCINOMA IS PRESENT AT SUPERIOR AND INFERIOR MARGIN. - ATYPICAL DUCTAL HYPERPLASIA PRESENT - SEE TUMOR TEMPLATE BELOW. 2. Lymph node, sentinel, biopsy, Left axillary - ONE LYMPH NODE,  Receptor Status: ER(+), PR (+), Her2-neu (+)  1. CHROMOGENIC IN-SITU HYBRIDIZATION Results: HER-2/NEU BY CISH - NO AMPLIFICATION OF HER-2 DETECTED. RESULT RATIO OF HER2: CEP 17 SIGNALS 1.31 AVERAGE HER2 COPY NUMBER PER CELL 1.40 REFERENCE RANGE NEGATIVE HER2/Chr17 Ratio <2.0 and Average HER2 copy number <4.0 EQUIVOCAL HER2/Chr17 Ratio <2.0 and Average HER2 copy number 4.0 and <6.0 POSITIVE HER2/Chr17 Ratio >=2.0 and/or Average HER2 copy number >=6.0 Pecola Leisure MD Pathologist, Electronic Signature ( Signed 09/26/2012)  Did patient present with symptoms (if so, please note symptoms) or was this found on screening mammography?: abnormal mammogram  Past/Anticipated interventions by surgeon, if any: Left  Lumpectomy, SN  09/03/12  Past/Anticipated interventions by medical oncology, if any: Chemotherapy: pt seen in med onc 09/23/12, to return in 2 weeks, Oncotype DX test prior to chemo   Lymphedema issues, if any:  no   Pain issues, if any:  none  SAFETY ISSUES:  Prior radiation? no  Pacemaker/ICD?   Possible current pregnancy? no  Is the patient on methotrexate? no  Current Complaints / other details:  Denies pain, fatigue, loss of appetite, husband w/pt today    Glennie Hawk, RN 09/26/2012,3:26 PM

## 2012-09-27 ENCOUNTER — Encounter: Payer: Self-pay | Admitting: *Deleted

## 2012-09-27 NOTE — Progress Notes (Signed)
Mailed after appt letter to pt. 

## 2012-09-29 ENCOUNTER — Encounter: Payer: Self-pay | Admitting: Radiation Oncology

## 2012-09-29 ENCOUNTER — Ambulatory Visit
Admission: RE | Admit: 2012-09-29 | Discharge: 2012-09-29 | Disposition: A | Discharge status: Home / Self Care | Payer: Medicare Other | Source: Ambulatory Visit | Attending: Radiation Oncology | Admitting: Radiation Oncology

## 2012-09-29 VITALS — BP 122/62 | HR 78 | Temp 97.7°F | Resp 20 | Wt 182.3 lb

## 2012-09-29 DIAGNOSIS — C50919 Malignant neoplasm of unspecified site of unspecified female breast: Secondary | ICD-10-CM | POA: Insufficient documentation

## 2012-09-29 DIAGNOSIS — C50119 Malignant neoplasm of central portion of unspecified female breast: Secondary | ICD-10-CM | POA: Diagnosis not present

## 2012-09-29 DIAGNOSIS — C50112 Malignant neoplasm of central portion of left female breast: Secondary | ICD-10-CM

## 2012-09-29 NOTE — Progress Notes (Signed)
Radiation Oncology         [phone] ________________________________  Name: [name] MRN: [id number]  Date: 09/29/2012  DOB: 1946/02/08  Reevaluation note  CC: [name], MD  [name], MD  Diagnosis:   Stage I invasive lobular carcinoma of the left breast  Narrative:  The patient returns today for further evaluation. The patient was initially seen in the multidisciplinary breast clinic on 08/27/2012.  Since that time the patient has a undergone definitive surgery.  On June 18 patient underwent a left partial mastectomy with needle localization along with blue dye injection and axillary sentinel lymph node biopsy.  The patient was found to have a 1.6 cm invasive lobular carcinoma. The anterior margin was close at less than 1 mm. There was lobular carcinoma in situ present at the superior and inferior margins but no DCIS at the margins.   the sentinel node recovered showed no evidence of metastasis. The patient has done well since her surgery. Patient did see Dr. Welton Flakes recently and a Oncotype DX test was ordered which is currently pending.                          ALLERGIES:  is allergic to adhesive.  Meds: Current Outpatient Prescriptions  Medication Sig Dispense Refill  . aspirin 81 MG tablet Take 81 mg by mouth daily.      . calcium citrate-vitamin D (CITRACAL+D) 315-200 MG-UNIT per tablet Take 1 tablet by mouth 2 (two) times daily. 600 mg. Calcium, 400 mg. Vit. D3      . Cyanocobalamin (VITAMIN B-12 CR PO) Take 500 mcg by mouth daily.       . fluticasone (FLONASE) 50 MCG/ACT nasal spray Place 4 sprays into the nose daily.  16 g  12  . lisinopril-hydrochlorothiazide (PRINZIDE,ZESTORETIC) 10-12.5 MG per tablet Take 0.5 tablets by mouth daily.  90 tablet  1  . Omega-3 Fatty Acids (FISH OIL) 1000 MG CAPS Take 1 capsule by mouth daily.      . vitamin C (ASCORBIC ACID) 500 MG tablet Take 1,000 mg by mouth daily.       . Zinc 50 MG TABS Take 1 tablet by mouth daily.       . [DISCONTINUED] LISINOPRIL PO Take 1 tablet by mouth daily.       No current facility-administered medications for this encounter.    Physical Findings: The patient is in no acute distress. Patient is alert and oriented.  weight is 182 lb 4.8 oz (82.691 kg). Her oral temperature is 97.7 F (36.5 C). Her blood pressure is 122/62 and her pulse is 78. Her respiration is 20. Marland Kitchen No palpable supraclavicular or axillary adenopathy. The lungs are clear to auscultation. The heart has a regular rhythm and rate. Examination of the left breast reveals a circumareolar scar along the superior aspect of the areolar border. There is no signs of drainage or infection in the breast. The patient has some thickening at the surgical site consistent with possible seroma. The patient's axillary scar is healing well without signs of drainage or infection.  Lab Findings: Lab Results  Component Value Date   WBC 6.1 09/15/2012   HGB 14.0 09/15/2012   HCT 40.6 09/15/2012   MCV 85.3 09/15/2012   PLT 286 09/15/2012      Radiographic Findings: Nm Sentinel Node Inj-no Rpt (breast)  09/03/2012   CLINICAL DATA: cancer of the left breast   Sulfur colloid was injected intradermally by  the nuclear medicine  technologist for breast cancer sentinel node localization.     Impression: Stage I invasive lobular carcinoma of the left breast. The patient would be a good candidate for breast conservation with radiation therapy directed at the left breast area.  She may potentially be a candidate for hypo-fractionated, accelerated treatment depending on the treatment set up.  Plan:  Final details concerning management are pending Oncotype DX testing results.  _____________________________________  -----------------------------------  Billie Lade, PhD, MD

## 2012-09-29 NOTE — Addendum Note (Signed)
Encounter addended by: Billie Lade, MD on: 09/29/2012  1:00 PM<BR>     Documentation filed: Visit Diagnoses, Clinical Notes, Notes Section, Problem List

## 2012-09-29 NOTE — Progress Notes (Signed)
Please see the Nurse Progress Note in the MD Initial Consult Encounter for this patient. 

## 2012-09-30 ENCOUNTER — Ambulatory Visit: Payer: Medicare Other

## 2012-09-30 ENCOUNTER — Telehealth: Payer: Self-pay | Admitting: Oncology

## 2012-09-30 ENCOUNTER — Encounter: Payer: Self-pay | Admitting: *Deleted

## 2012-09-30 ENCOUNTER — Ambulatory Visit (HOSPITAL_BASED_OUTPATIENT_CLINIC_OR_DEPARTMENT_OTHER): Payer: Medicare Other | Admitting: Oncology

## 2012-09-30 VITALS — BP 151/75 | HR 71 | Temp 97.6°F | Resp 18 | Ht 61.0 in | Wt 178.6 lb

## 2012-09-30 DIAGNOSIS — C50112 Malignant neoplasm of central portion of left female breast: Secondary | ICD-10-CM

## 2012-09-30 DIAGNOSIS — C50119 Malignant neoplasm of central portion of unspecified female breast: Secondary | ICD-10-CM

## 2012-09-30 NOTE — Patient Instructions (Addendum)
Repeat Her2Neu was negative   You can proceed with radiation therapy  I will see you back in 2 months

## 2012-09-30 NOTE — Telephone Encounter (Signed)
gv pt appt schedule for August thru December. Per pt her monthly inj appts are on hold. Message sent to [facility] to confirm - pt aware.

## 2012-10-01 NOTE — Progress Notes (Signed)
Patient at [facility] for f/u visit with Dr. Welton Flakes, accompanied by her husband.  I met with patient to check in and see if she had any questions or concerns.  She reports that she is doing very well and is anxious to discuss next steps in her treatment plan so that she can make plans accordingly.  She denies any questions or concerns at this time.  She was encouraged to call me for any needs.  Patient verbalized understanding.

## 2012-10-02 ENCOUNTER — Other Ambulatory Visit: Payer: Self-pay | Admitting: Emergency Medicine

## 2012-10-03 ENCOUNTER — Telehealth: Payer: Self-pay | Admitting: Oncology

## 2012-10-03 NOTE — Telephone Encounter (Signed)
Per staff from [facility] inj to be cx'd. appts already cx'd by desk nurse.

## 2012-10-08 ENCOUNTER — Telehealth (INDEPENDENT_AMBULATORY_CARE_PROVIDER_SITE_OTHER): Payer: Self-pay

## 2012-10-08 ENCOUNTER — Other Ambulatory Visit: Payer: Self-pay | Admitting: Radiation Oncology

## 2012-10-08 NOTE — Telephone Encounter (Signed)
Called and spoke to patient with appointment reminder for 10/09/12 @ 11:30 am w/Dr. Johna Sheriff.

## 2012-10-09 ENCOUNTER — Encounter (INDEPENDENT_AMBULATORY_CARE_PROVIDER_SITE_OTHER): Payer: Self-pay | Admitting: General Surgery

## 2012-10-09 ENCOUNTER — Ambulatory Visit (INDEPENDENT_AMBULATORY_CARE_PROVIDER_SITE_OTHER): Payer: Medicare Other | Admitting: General Surgery

## 2012-10-09 ENCOUNTER — Ambulatory Visit
Admission: RE | Admit: 2012-10-09 | Discharge: 2012-10-09 | Disposition: A | Discharge status: Home / Self Care | Payer: Medicare Other | Source: Ambulatory Visit | Attending: Radiation Oncology | Admitting: Radiation Oncology

## 2012-10-09 VITALS — BP 118/70 | HR 76 | Resp 16 | Ht 61.5 in | Wt 178.8 lb

## 2012-10-09 DIAGNOSIS — L539 Erythematous condition, unspecified: Secondary | ICD-10-CM | POA: Diagnosis not present

## 2012-10-09 DIAGNOSIS — C50119 Malignant neoplasm of central portion of unspecified female breast: Secondary | ICD-10-CM | POA: Diagnosis not present

## 2012-10-09 DIAGNOSIS — Z510 Encounter for antineoplastic radiation therapy: Secondary | ICD-10-CM | POA: Diagnosis not present

## 2012-10-09 DIAGNOSIS — L299 Pruritus, unspecified: Secondary | ICD-10-CM | POA: Insufficient documentation

## 2012-10-09 DIAGNOSIS — L259 Unspecified contact dermatitis, unspecified cause: Secondary | ICD-10-CM | POA: Insufficient documentation

## 2012-10-09 DIAGNOSIS — L819 Disorder of pigmentation, unspecified: Secondary | ICD-10-CM | POA: Insufficient documentation

## 2012-10-09 DIAGNOSIS — C50112 Malignant neoplasm of central portion of left female breast: Secondary | ICD-10-CM

## 2012-10-09 DIAGNOSIS — R5381 Other malaise: Secondary | ICD-10-CM | POA: Insufficient documentation

## 2012-10-09 DIAGNOSIS — R5383 Other fatigue: Secondary | ICD-10-CM | POA: Diagnosis not present

## 2012-10-09 NOTE — Progress Notes (Signed)
History: Patient returns for followup approximately 6 weeks status post left breast lumpectomy and sentinel lymph node biopsy for T1 C. N0 invasive lobular cancer of the left breast.  She reports no problems and surgery. She has been exercising regularly. Denies pain or so or swelling or other concerns She does mention that she has had a lump palpable in her epigastrium which has been occasionally tender and  present for about a year.  Exam: She appears well. Her breast and axillary incisions are nicely healed without complication             In the epigastrium is a soft well-defined subcutaneous mass measuring about 5 x 3 cm consistent with a benign lipoma.  We reviewed her pathology. The lymph node was negative. Her invasive lobular tumor measured 1.6 cm and was within 1 mm of one margin but each negative. She did have lobular carcinoma in situ present at 2 margins.  Assessment and plan: Doing well following left breast lumpectomy and axillary sentinel lymph node biopsy. She is just starting her radiation therapy. Hormonal therapy is planned following this. I will plan to see her back in 6 months. If her lipoma is enlarging or bothering her we could consider excision but will hold off on this until she gets through her breast cancer treatment.

## 2012-10-13 ENCOUNTER — Telehealth: Payer: Self-pay | Admitting: Neurology

## 2012-10-13 NOTE — Progress Notes (Signed)
  Radiation Oncology         [phone] ________________________________  Name: [name] MRN: [id number]  Date: 10/09/2012  DOB: 04-16-1945  SIMULATION AND TREATMENT PLANNING NOTE  DIAGNOSIS: Stage I invasive lobular carcinoma of the left breast   NARRATIVE:  The patient was brought to the CT Simulation planning suite.  Identity was confirmed.  All relevant records and images related to the planned course of therapy were reviewed.  The patient freely provided informed written consent to proceed with treatment after reviewing the details related to the planned course of therapy. The consent form was witnessed and verified by the simulation staff.  Then, the patient was set-up in a stable reproducible  supine position for radiation therapy.  CT images were obtained.  Surface markings were placed.  The CT images were loaded into the planning software.  Then the target and avoidance structures were contoured.  Treatment planning then occurred.  The radiation prescription was entered and confirmed.  Then, I designed and supervised the construction of a total of 3 medically necessary complex treatment devices.  I have requested : 3D Simulation  I have requested a DVH of the following structures: lumpectomy cavity, heart, lungs.  I have ordered:dose calc.  PLAN:  The patient will receive 50.4 Gy in 28 fractions, followed by a boost to the lumpectomy cavity to 62.4 Gy.  ________________________________ -----------------------------------  Billie Lade, PhD, MD

## 2012-10-16 ENCOUNTER — Ambulatory Visit
Admission: RE | Admit: 2012-10-16 | Discharge: 2012-10-16 | Disposition: A | Discharge status: Home / Self Care | Payer: Medicare Other | Source: Ambulatory Visit | Attending: Radiation Oncology | Admitting: Radiation Oncology

## 2012-10-16 DIAGNOSIS — C50119 Malignant neoplasm of central portion of unspecified female breast: Secondary | ICD-10-CM | POA: Diagnosis not present

## 2012-10-16 DIAGNOSIS — L819 Disorder of pigmentation, unspecified: Secondary | ICD-10-CM | POA: Diagnosis not present

## 2012-10-16 DIAGNOSIS — Z510 Encounter for antineoplastic radiation therapy: Secondary | ICD-10-CM | POA: Diagnosis not present

## 2012-10-16 DIAGNOSIS — C50112 Malignant neoplasm of central portion of left female breast: Secondary | ICD-10-CM

## 2012-10-16 DIAGNOSIS — L299 Pruritus, unspecified: Secondary | ICD-10-CM | POA: Diagnosis not present

## 2012-10-16 DIAGNOSIS — L539 Erythematous condition, unspecified: Secondary | ICD-10-CM | POA: Diagnosis not present

## 2012-10-16 DIAGNOSIS — R5383 Other fatigue: Secondary | ICD-10-CM | POA: Diagnosis not present

## 2012-10-16 NOTE — Progress Notes (Signed)
  Radiation Oncology         [phone] ________________________________  Name: [name] MRN: [id number]  Date: 10/16/2012  DOB: 1946/01/01  Simulation Verification Note  Status: outpatient  NARRATIVE: The patient was brought to the treatment unit and placed in the planned treatment position. The clinical setup was verified. Then port films were obtained and uploaded to the radiation oncology medical record software.  The treatment beams were carefully compared against the planned radiation fields. The position location and shape of the radiation fields was reviewed. They targeted volume of tissue appears to be appropriately covered by the radiation beams. Organs at risk appear to be excluded as planned.  Based on my personal review, I approved the simulation verification. The patient's treatment will proceed as planned.  -----------------------------------  Billie Lade, PhD, MD

## 2012-10-20 ENCOUNTER — Ambulatory Visit
Admission: RE | Admit: 2012-10-20 | Discharge: 2012-10-20 | Disposition: A | Discharge status: Home / Self Care | Payer: Medicare Other | Source: Ambulatory Visit | Attending: Radiation Oncology | Admitting: Radiation Oncology

## 2012-10-20 DIAGNOSIS — R5381 Other malaise: Secondary | ICD-10-CM | POA: Diagnosis not present

## 2012-10-20 DIAGNOSIS — L819 Disorder of pigmentation, unspecified: Secondary | ICD-10-CM | POA: Diagnosis not present

## 2012-10-20 DIAGNOSIS — L539 Erythematous condition, unspecified: Secondary | ICD-10-CM | POA: Diagnosis not present

## 2012-10-20 DIAGNOSIS — Z510 Encounter for antineoplastic radiation therapy: Secondary | ICD-10-CM | POA: Diagnosis not present

## 2012-10-20 DIAGNOSIS — C50119 Malignant neoplasm of central portion of unspecified female breast: Secondary | ICD-10-CM | POA: Diagnosis not present

## 2012-10-20 DIAGNOSIS — L299 Pruritus, unspecified: Secondary | ICD-10-CM | POA: Diagnosis not present

## 2012-10-21 ENCOUNTER — Ambulatory Visit (INDEPENDENT_AMBULATORY_CARE_PROVIDER_SITE_OTHER): Payer: Medicare Other | Admitting: Neurology

## 2012-10-21 ENCOUNTER — Encounter: Payer: Self-pay | Admitting: Neurology

## 2012-10-21 ENCOUNTER — Ambulatory Visit
Admission: RE | Admit: 2012-10-21 | Discharge: 2012-10-21 | Disposition: A | Discharge status: Home / Self Care | Payer: Medicare Other | Source: Ambulatory Visit | Attending: Radiation Oncology | Admitting: Radiation Oncology

## 2012-10-21 VITALS — BP 129/73 | HR 72 | Resp 16 | Ht 62.75 in | Wt 179.0 lb

## 2012-10-21 VITALS — BP 142/60 | HR 76 | Temp 97.5°F | Resp 20 | Wt 181.1 lb

## 2012-10-21 DIAGNOSIS — G4733 Obstructive sleep apnea (adult) (pediatric): Secondary | ICD-10-CM

## 2012-10-21 DIAGNOSIS — Z9989 Dependence on other enabling machines and devices: Secondary | ICD-10-CM

## 2012-10-21 DIAGNOSIS — C50112 Malignant neoplasm of central portion of left female breast: Secondary | ICD-10-CM

## 2012-10-21 DIAGNOSIS — L819 Disorder of pigmentation, unspecified: Secondary | ICD-10-CM | POA: Diagnosis not present

## 2012-10-21 DIAGNOSIS — C50119 Malignant neoplasm of central portion of unspecified female breast: Secondary | ICD-10-CM | POA: Diagnosis not present

## 2012-10-21 DIAGNOSIS — L299 Pruritus, unspecified: Secondary | ICD-10-CM | POA: Diagnosis not present

## 2012-10-21 DIAGNOSIS — Z510 Encounter for antineoplastic radiation therapy: Secondary | ICD-10-CM | POA: Diagnosis not present

## 2012-10-21 DIAGNOSIS — R5383 Other fatigue: Secondary | ICD-10-CM | POA: Diagnosis not present

## 2012-10-21 DIAGNOSIS — L539 Erythematous condition, unspecified: Secondary | ICD-10-CM | POA: Diagnosis not present

## 2012-10-21 MED ORDER — ALRA NON-METALLIC DEODORANT (RAD-ONC)
1.0000 "application " | Freq: Once | TOPICAL | Status: AC
  Administered 2012-10-21: 1 via TOPICAL

## 2012-10-21 MED ORDER — RADIAPLEXRX EX GEL
Freq: Once | CUTANEOUS | Status: AC
  Administered 2012-10-21: 14:00:00 via TOPICAL

## 2012-10-21 NOTE — Progress Notes (Addendum)
Pt denies pain, fatigue, loss of appetite. Post sim ed completed w/pt. Gave pt "Radiation and You" booklet w/all pertinent information marked and discussed, re: fatigue, skin irritation/care, nutrition, pain. Gave pt Radiaplex, Alra w/instructions for proper use. All questions answered. Pt verbalized understanding.

## 2012-10-21 NOTE — Patient Instructions (Signed)
Sleep Apnea  Sleep apnea is disorder that affects a person's sleep. A person with sleep apnea has abnormal pauses in their breathing when they sleep. It is hard for them to get a good sleep. This makes a person tired during the day. It also can lead to other physical problems. There are three types of sleep apnea. One type is when breathing stops for a short time because your airway is blocked (obstructive sleep apnea). Another type is when the brain sometimes fails to give the normal signal to breathe to the muscles that control your breathing (central sleep apnea). The third type is a combination of the other two types.  HOME CARE  · Do not sleep on your back. Try to sleep on your side.  · Take all medicine as told by your doctor.  · Avoid alcohol, calming medicines (sedatives), and depressant drugs.  · Try to lose weight if you are overweight. Talk to your doctor about a healthy weight goal.  Your doctor may have you use a device that helps to open your airway. It can help you get the air that you need. It is called a positive airway pressure (PAP) device. There are three types of PAP devices:  · Continuous positive airway pressure (CPAP) device.  · Nasal expiratory positive airway pressure (EPAP) device.  · Bilevel positive airway pressure (BPAP) device.  MAKE SURE YOU:  · Understand these instructions.  · Will watch your condition.  · Will get help right away if you are not doing well or get worse.  Document Released: 12/13/2007 Document Revised: 02/20/2012 Document Reviewed: 07/07/2011  ExitCare® Patient Information ©2014 [employer].

## 2012-10-21 NOTE — Progress Notes (Signed)
Weekly Management Note Current Dose: 3.6  Gy  Projected Dose:50.4  Gy   Narrative:  The patient presents for routine under treatment assessment.  CBCT/MVCT images/Port film x-rays were reviewed.  The chart was checked. Doing well. RN education performed.  Physical Findings: Weight: 181 lb 1.6 oz (82.146 kg). Unchanged  Impression:  The patient is tolerating radiation.  Plan:  Continue treatment as planned.

## 2012-10-21 NOTE — Progress Notes (Signed)
[facility]  Provider:  Melvyn Novas, M D  Referring Provider: Ethelda Chick, MD Primary Care Physician:  Diane Simmer, MD  Chief Complaint  Patient presents with  . Follow-up    2 mos.,OSA, rm 11    HPI:  [name] is a 67 y.o. female  Is seen here as a referral/ revisit  from [place]. [name] and [name] for sleep apnea.    [name] has been followed in the sleep clinic since 09-26-09 she was referred originally by Dr. Nadyne [name] at the time noted BMI of 42.1 and a neck circumference of 16 inches she had very high prestudy blood pressure is. Her Epworth sleepiness score was only 4 points. During the sleep study and a H. I of 30.9 was documented, during REM sleep the AHI was 61.1. The patient was titrated to CPAP at 10 cm water and has been using the setting of her sense download skin 2012, 2013 and not his house and 14 show good compliance residual AHI is between 0.9 and 1.8 and average she was at times of 6 hours and more. There has been only a mild to moderate air leak with a current interface. The patient is now using a nasal miles after a nasal pillow failed. She had developed nasal blisters.  She was able to discontinue statin and metformin. She implemented a weight loss program, lost a total of  60 pounds on a low carb diet.   The patient normal bedtime is around 11 also she may take up to an hour to fall asleep and she likes to read and that. Him she rises a morning at at the latest at 7:30 AM. She would over all estimate her overnight sleep time to be 6-7 hours. She does not have nocturia, husband reports there is no snoring or deposits and she's using CPAP. She sleeps supine as well as on the side. She's not aware of any family history of sleep disorder breathing.  She has a history of diabetes, allergic rhinitis and developed sinus problems with the use of a nasal pillow. She has never had surgery to the facial skull upper airway,nose,  lower jaw. She never underwent a tonsillectomy. She has atrophic tonsils.   Review of Systems: Out of a complete 14 system review, the patient complains of only the following symptoms, and all other reviewed systems are negative.  epworth 3 points, GDS  Zero    , FSS 16 , no elevated fall risk 1 point for age.  The patient was diagnosed with breast cancer .   History   Social History  . Marital Status: Married    Spouse Name: [name]    Number of Children: 4  . Years of Education: College   Occupational History  . retired    Social History Main Topics  . Smoking status: Never Smoker   . Smokeless tobacco: Never Used  . Alcohol Use: No  . Drug Use: No  . Sexually Active: Not on file     Comment: menarche 13, P4, Premarin for short while, Provera x 3 years   Other Topics Concern  . Not on file   Social History Narrative   Marital status: married x 47 years; happily married; no abuse.      Children: 4 children; six grandchildren.      Lives:  With husband.      Employed: retired in 2011; [employer].      Tobacco:  none       Alcohol:  None      Drugs: none      Exercise:  Daily. Walking x 1 hour three days per week.      Seatbelt:  100%      Sunscreen:  Face SPF 15.      Guns:  Loaded mostly secured.      ADLs:  Drives; no assistant devices; independent with all ADLs.       Her nocturia is improved under CPAP use at 10 cm water- AHI is now 0.8 and from 30 at baseline. CMS compliance  6 hours 40 minutes - sleeps on the side, 08-10-11 .FFM - likes it.    . Flonase used prn.     Caffeine Use: 1 cup daily    Family History  Problem Relation Age of Onset  . Hypertension Other   . Vision loss Mother   . Hypertension Mother   . Anesthesia problems Mother     post-op N/V  . Heart disease Mother   . Osteoporosis Mother   . Depression Father   . Heart disease Sister   . Hypertension Sister   . Diabetes Brother   . Hypertension Brother   . Cancer  Brother     skin  . Hyperlipidemia Brother   . COPD Daughter   . Cancer Cousin   . Kidney failure Maternal Grandmother   . Heart disease Maternal Grandfather     Past Medical History  Diagnosis Date  . Postmenopausal bleeding   . Fibroids   . Headache(784.0)     tension  . Seasonal allergies   . Anemia     due to vaginal bleeding  . Urinary urgency   . Hypertension     under control with med., has been on med. x 3 yr.  . White coat hypertension   . History of endometriosis   . Breast cancer 08/2012    left  . Sleep apnea     uses CPAP nightly  . Arthritis     hands  . Dental crowns present   . Hyperlipidemia   . OSA (obstructive sleep apnea)     Past Surgical History  Procedure Laterality Date  . Hysteroscopy w/d&c  05/06/2009  . Exploratory laparotomy  06-30-2007    ATTEMPTED HYSTERECTOMY ABORTED DUE TO EXTENSIVE ENDOMETRIOSIS W/ DENSE PELVIC ADHESIONS INVOLVING UTERUS AND LOWER RECTOSIGMOID  . Cholecystectomy  1995  . Colonoscopy w/ polypectomy  03/20/2007  . Back surgery  1997  . Dilation and curettage of uterus  2005  . Dilation and curettage of uterus  06/22/2011    Procedure: DILATATION AND CURETTAGE;  Surgeon: Jeannette Corpus, MD;  Location: [facility];  Service: Gynecology;  Laterality: N/A;  OK PER KEELA FOR 7:15 START  . Breast lumpectomy with needle localization and axillary sentinel lymph node bx Left 09/03/2012    Procedure: BREAST LUMPECTOMY WITH NEEDLE LOCALIZATION AND AXILLARY SENTINEL LYMPH NODE BX;  Surgeon: Mariella Saa, MD;  Location: [facility];  Service: General;  Laterality: Left;    Current Outpatient Prescriptions  Medication Sig Dispense Refill  . aspirin 81 MG tablet Take 81 mg by mouth daily.      . calcium citrate-vitamin D (CITRACAL+D) 315-200 MG-UNIT per tablet Take 1 tablet by mouth 2 (two) times daily. 600 mg. Calcium, 400 mg. Vit. D3      . Cyanocobalamin (VITAMIN B-12 CR PO) Take 500 mcg by  mouth daily.       Marland Kitchen  fluticasone (FLONASE) 50 MCG/ACT nasal spray Place 4 sprays into the nose daily. As needed      . hyaluronate sodium (RADIAPLEXRX) GEL Apply topically 2 (two) times daily.      Marland Kitchen lisinopril-hydrochlorothiazide (PRINZIDE,ZESTORETIC) 10-12.5 MG per tablet Take 0.5 tablets by mouth daily.  90 tablet  1  . non-metallic deodorant (ALRA) MISC Apply 1 application topically daily as needed.      . Omega-3 Fatty Acids (FISH OIL) 1000 MG CAPS Take 1 capsule by mouth daily.      . vitamin C (ASCORBIC ACID) 500 MG tablet Take 1,000 mg by mouth daily.       . Zinc 50 MG TABS Take 1 tablet by mouth daily.      . [DISCONTINUED] LISINOPRIL PO Take 1 tablet by mouth daily.       No current facility-administered medications for this visit.    Allergies as of 10/21/2012 - Review Complete 10/21/2012  Allergen Reaction Noted  . Adhesive (tape) Rash 08/28/2012    Vitals: BP 129/73  Pulse 72  Ht 5' 2.75" (1.594 m)  Wt 179 lb (81.194 kg)  BMI 31.96 kg/m2 Last Weight:  Wt Readings from Last 1 Encounters:  10/21/12 179 lb (81.194 kg)   Last Height:   Ht Readings from Last 1 Encounters:  10/21/12 5' 2.75" (1.594 m)    Physical exam:  General: The patient is awake, alert and appears not in acute distress. The patient is well groomed. Head: Normocephalic, atraumatic. Neck is supple. Mallampati 1, neck circumference: 13.5 .  Cardiovascular:  Regular rate and rhythm without  murmurs or carotid bruit, and without distended neck veins. Respiratory: Lungs are clear to auscultation. Skin:  At both ankles evidence of edema, not  rash Trunk: BMI is elevated ,and patient  has normal posture.  Neurologic exam : The patient is awake and alert, oriented to place and time.  Memory subjective  described as intact. There is a normal attention span & concentration ability. Speech is fluent without  dysarthria, dysphonia or aphasia. Mood and affect are appropriate.  Cranial nerves: Pupils are  equal and briskly reactive to light. Funduscopic exam without  evidence of pallor or edema. Extraocular movements  in vertical and horizontal planes intact and without nystagmus. Visual fields by finger perimetry are intact. Hearing to finger rub intact.  Facial sensation intact to fine touch. Facial motor strength is symmetric and tongue and uvula move midline.  Motor exam:   Normal tone and normal muscle bulk and symmetric normal strength in all extremities.  Sensory:  Fine touch, pinprick and vibration were tested in all extremities. Coordination: Rapid alternating movements in the fingers/hands is tested and normal. Finger-to-nose maneuver tested and normal without evidence of ataxia, dysmetria or tremor.  Gait and station: Patient walks without assistive device and is able and assisted stool climb up to the exam table. Strength within normal limits. Stance is stable and normal.     The the patient has achieved a weight loss of over 60 pounds  from her original weight at the time of testing. She was able to would discontinue statin and metformin medications.   The last time we met I hopes intended to reset her pressure to a lower setting based on an older titration today it shows that her device setting is still 11.9 at the 95% percentile with a residual AHI of 1.8.  It surprises me that her CPAP need should be 2 cm water pressure higher than at the time  of her highest weight during which he was using 10 cm water pressure.  She is also using a nasal mask now . Since the AHI is ideal , and she is 100% compliant , I do not make any changes.  RV in 12 month with CPAP .

## 2012-10-22 ENCOUNTER — Ambulatory Visit
Admission: RE | Admit: 2012-10-22 | Discharge: 2012-10-22 | Disposition: A | Discharge status: Home / Self Care | Payer: Medicare Other | Source: Ambulatory Visit | Attending: Radiation Oncology | Admitting: Radiation Oncology

## 2012-10-22 DIAGNOSIS — R5381 Other malaise: Secondary | ICD-10-CM | POA: Diagnosis not present

## 2012-10-22 DIAGNOSIS — L539 Erythematous condition, unspecified: Secondary | ICD-10-CM | POA: Diagnosis not present

## 2012-10-22 DIAGNOSIS — L299 Pruritus, unspecified: Secondary | ICD-10-CM | POA: Diagnosis not present

## 2012-10-22 DIAGNOSIS — C50119 Malignant neoplasm of central portion of unspecified female breast: Secondary | ICD-10-CM | POA: Diagnosis not present

## 2012-10-22 DIAGNOSIS — Z510 Encounter for antineoplastic radiation therapy: Secondary | ICD-10-CM | POA: Diagnosis not present

## 2012-10-22 DIAGNOSIS — L819 Disorder of pigmentation, unspecified: Secondary | ICD-10-CM | POA: Diagnosis not present

## 2012-10-22 NOTE — Progress Notes (Signed)
OFFICE PROGRESS NOTE  [name],[name], MD [address] [place] [address] Dr. Glenna Fellows  Dr. Antony Blackbird  DIAGNOSIS: 67 year old female with new diagnosis of screen detected invasive mammary carcinoma with lobular features  STAGE:  Cancer of central portion of female breast  Primary site: Breast (Left)  Staging method: AJCC 7th Edition  Clinical: Stage IA (T1c, N0, cM0)  Summary: Stage IA (T1c, N0, cM0)  PRIOR THERAPY: #1Patient recently will underwent a screening mammogram and she was found to have a questionable mass just deep to the nipple in the superior. Periareolar region. Calcifications were present adjacent to but not within the mass. There was also noted to be focal abnormal density medially on the craniocaudal projection. Left breast ultrasound revealed a hypoechoic mass in the 12:00 position 1 cm from the nipple measuring 5 x 8 mm. There was also an ovoid cystic appearing lesion in the 11:00 position 2 cm from the nipple measuring 1.5 x 0.4 cm. Patient had a ultrasound guided core biopsy performed. The biopsy revealed invasive mammary carcinoma with mammary carcinoma in situ grade 2 with lobular features. Tumor was ER +100% PR +63% proliferation marker Ki-67 58% HER-2/neu showed amplification with a ratio of 2.48.  #2 Patient also had MRI of the breasts performed the MRI showed irregular enhancing mass within spiculated margins posterior and lateral to this mass wasn't immediately adjacent smaller satellite mass demonstrating similar features together the 2 masses measured 17 mm x 11 mm x 15 mm. There were no other abnormalities left axilla was negative  #3 patient is now status post lumpectomy of the left breast with sentinel lymph node biopsy the final pathology revealed a 1.6 cm grade 2 invasive lobular carcinoma, ER positive PR positive. HER-2/neu was not repeated and I have requested that the HER-2/neu status be repeated. The biopsy HER-2 was positive. However  the HER-2/neu result is now negative on the final specimen.  CURRENT THERAPY:   INTERVAL HISTORY: [name] 67 y.o. female returns for followup visit  Overall she seems to be doing well without any significant problems. She denies any fevers chills night sweats headaches shortness of breath chest pains palpitations no myalgias and arthralgias. Her surgical site is healing well  MEDICAL HISTORY: Past Medical History  Diagnosis Date  . Postmenopausal bleeding   . Fibroids   . Headache(784.0)     tension  . Seasonal allergies   . Anemia     due to vaginal bleeding  . Urinary urgency   . Hypertension     under control with med., has been on med. x 3 yr.  . White coat hypertension   . History of endometriosis   . Breast cancer 08/2012    left  . Sleep apnea     uses CPAP nightly  . Arthritis     hands  . Dental crowns present   . Hyperlipidemia   . OSA (obstructive sleep apnea)     ALLERGIES:  is allergic to adhesive.  MEDICATIONS:  Current Outpatient Prescriptions  Medication Sig Dispense Refill  . aspirin 81 MG tablet Take 81 mg by mouth daily.      . calcium citrate-vitamin D (CITRACAL+D) 315-200 MG-UNIT per tablet Take 1 tablet by mouth 2 (two) times daily. 600 mg. Calcium, 400 mg. Vit. D3      . Cyanocobalamin (VITAMIN B-12 CR PO) Take 500 mcg by mouth daily.       Marland Kitchen lisinopril-hydrochlorothiazide (PRINZIDE,ZESTORETIC) 10-12.5 MG per tablet Take 0.5 tablets  by mouth daily.  90 tablet  1  . Omega-3 Fatty Acids (FISH OIL) 1000 MG CAPS Take 1 capsule by mouth daily.      . vitamin C (ASCORBIC ACID) 500 MG tablet Take 1,000 mg by mouth daily.       . Zinc 50 MG TABS Take 1 tablet by mouth daily.      . fluticasone (FLONASE) 50 MCG/ACT nasal spray Place 4 sprays into the nose daily. As needed      . hyaluronate sodium (RADIAPLEXRX) GEL Apply topically 2 (two) times daily.      . non-metallic deodorant Thornton Papas) MISC Apply 1 application topically daily as needed.      .  [DISCONTINUED] LISINOPRIL PO Take 1 tablet by mouth daily.       No current facility-administered medications for this visit.    SURGICAL HISTORY:  Past Surgical History  Procedure Laterality Date  . Hysteroscopy w/d&c  05/06/2009  . Exploratory laparotomy  06-30-2007    ATTEMPTED HYSTERECTOMY ABORTED DUE TO EXTENSIVE ENDOMETRIOSIS W/ DENSE PELVIC ADHESIONS INVOLVING UTERUS AND LOWER RECTOSIGMOID  . Cholecystectomy  1995  . Colonoscopy w/ polypectomy  03/20/2007  . Back surgery  1997  . Dilation and curettage of uterus  2005  . Dilation and curettage of uterus  06/22/2011    Procedure: DILATATION AND CURETTAGE;  Surgeon: Jeannette Corpus, MD;  Location: [facility];  Service: Gynecology;  Laterality: N/A;  OK PER KEELA FOR 7:15 START  . Breast lumpectomy with needle localization and axillary sentinel lymph node bx Left 09/03/2012    Procedure: BREAST LUMPECTOMY WITH NEEDLE LOCALIZATION AND AXILLARY SENTINEL LYMPH NODE BX;  Surgeon: Mariella Saa, MD;  Location:  SURGERY CENTER;  Service: General;  Laterality: Left;    REVIEW OF SYSTEMS:  Pertinent items are noted in HPI.   HEALTH MAINTENANCE: PHYSICAL EXAMINATION: Blood pressure 151/75, pulse 71, temperature 97.6 F (36.4 C), temperature source Oral, resp. rate 18, height 5\' 1"  (1.549 m), weight 178 lb 9.6 oz (81.012 kg). Body mass index is 33.76 kg/(m^2). ECOG PERFORMANCE STATUS: 0 - Asymptomatic   General appearance: alert, cooperative and appears stated age Resp: clear to auscultation bilaterally Cardio: regular rate and rhythm GI: soft, non-tender; bowel sounds normal; no masses,  no organomegaly Extremities: extremities normal, atraumatic, no cyanosis or edema Neurologic: Grossly normal   LABORATORY DATA: Lab Results  Component Value Date   WBC 6.1 09/15/2012   HGB 14.0 09/15/2012   HCT 40.6 09/15/2012   MCV 85.3 09/15/2012   PLT 286 09/15/2012      Chemistry      Component Value  Date/Time   NA 139 09/15/2012 0838   NA 138 08/27/2012 1219   K 4.3 09/15/2012 0838   K 3.7 08/27/2012 1219   CL 104 09/15/2012 0838   CL 104 08/27/2012 1219   CO2 25 09/15/2012 0838   CO2 27 08/27/2012 1219   BUN 11 09/15/2012 0838   BUN 12.6 08/27/2012 1219   CREATININE 0.62 09/15/2012 0838   CREATININE 0.65 09/02/2012 1213   CREATININE 0.8 08/27/2012 1219      Component Value Date/Time   CALCIUM 9.5 09/15/2012 0838   CALCIUM 10.0 08/27/2012 1219   ALKPHOS 101 09/15/2012 0838   ALKPHOS 100 08/27/2012 1219   AST 16 09/15/2012 0838   AST 14 08/27/2012 1219   ALT 15 09/15/2012 0838   ALT 12 08/27/2012 1219   BILITOT 0.5 09/15/2012 0838   BILITOT 0.67 08/27/2012 1219  Diagnosis 1. Breast, lumpectomy, Left - INVASIVE LOBULAR CARCINOMA, SEE COMMENT. - INVASIVE TUMOR IS LESS THAN 0.1MM FROM NEAREST MARGIN (ANTERIOR). - LYMPHOVASCULAR INVASION IDENTIFIED. - LOBULAR CARCINOMA IN SITU. - IN SITU CARCINOMA IS PRESENT AT SUPERIOR AND INFERIOR MARGIN. - ATYPICAL DUCTAL HYPERPLASIA PRESENT - SEE TUMOR TEMPLATE BELOW. 2. Lymph node, sentinel, biopsy, Left axillary - ONE LYMPH NODE, NEGATIVE FOR TUMOR (0/1). - SEE COMMENT. Microscopic Comment 1. BREAST, INVASIVE TUMOR, WITH LYMPH NODE SAMPLING Specimen, including laterality: Left breast Procedure: Lumpectomy Grade: II of III Tubule formation: III Nuclear pleomorphism: II Mitotic:I Tumor size (gross measurement): 1.6 cm Margins: Invasive, distance to closest margin: Less than 0.1mm In-situ, distance to closest margin: Present at margin (see above) If margin positive, focally or broadly: Broadly Lymphovascular invasion: Present Ductal carcinoma in situ:None Grade: N/A Extensive intraductal component: N/A Lobular neoplasia: Present Tumor focality: Unifocal Treatment effect: None If present, treatment effect in breast tissue, lymph nodes or both: N/A 1 of 3 FINAL for [name], [name] ([id number]) Microscopic Comment(continued) Extent of  tumor: Skin: N/A Nipple: N/A Skeletal muscle: N/A Lymph nodes: # examined: 1 Lymph nodes with metastasis: 0 Breast prognostic profile: Not repeated Estrogen receptor: Previous study demonstrated 100% positivity ([id number]) Progesterone receptor: Previous study demonstrated 63% positivity ([id number]) Her 2 neu: Not repeated, previous study demonstrated amplification (6.45) ([id number]) Ki-67: Not repeated, previous study demonstrated 58% proliferation rate ([id number]) Non-neoplastic breast: Previous biopsy site, fibrocystic change and microcalcifications TNM: pT1c, pN0, pMX Comments: None. 2. Cytokeratin AE1 / AE3 immunostain does not demonstrate any intranodal metastatic epithelial tumor deposits. (CRR:caf 09/04/12) [place] RUND DO Pathologist, Electronic Signature (Case signed 09/05/2012)    RADIOGRAPHIC STUDIES:  Mr Breast Bilateral W Wo Contrast  08/26/2012   *RADIOLOGY REPORT*  Clinical Data: recent diagnosis of in situ and invasive carcinoma left breast  BUN and creatinine were obtained on site at [facility] at [address] Results:  BUN 11 mg/dL,  Creatinine 0.9 mg/dL.  BILATERAL BREAST MRI WITH AND WITHOUT CONTRAST  Technique: Multiplanar, multisequence MR images of both breasts were obtained prior to and following the intravenous administration of 16ml of Multihance.  Three dimensional images were evaluated at the independent DynaCad workstation.  Comparison:  all recent images from [facility] performed June 2014  Findings: There is mild background parenchymal enhancement.  The right breast and axilla are negative.  On the left, there is an anterior biopsy marker clip in the 12 o'clock position.  This is associated with a irregular enhancing mass with spiculated margins.  Posterior and lateral to this mass is an immediately adjacent smaller satellite mass demonstrating similar features.  Together, these masses measure 17mm (AP) x 11mm (lateral) x 15mm  (craniocaudal).  There are no other parencymal abnormalities.  The left axilla is negative.  IMPRESSION: Dominant, biopsied mass with small immediately adjacent satellite mass consistent with biopsy results indicating malignancy, measuring 17 x 11 x 15mm.  BI-RADS CATEGORY 6:  Known biopsy-proven malignancy - appropriate action should be taken.  THREE-DIMENSIONAL MR IMAGE RENDERING ON INDEPENDENT WORKSTATION:  Three-dimensional MR images were rendered by post-processing of the original MR data on an independent workstation.  The three- dimensional MR images were interpreted, and findings were reported in the accompanying complete MRI report for this study.   Original Report Authenticated By: Esperanza Heir, M.D.   Nm Sentinel Node Inj-no Rpt (breast)  09/03/2012   CLINICAL DATA: cancer of the left breast   Sulfur colloid was injected intradermally by the nuclear medicine  technologist for breast cancer sentinel node localization.     ASSESSMENT: 67 year old female with  #1 stage I( T1 C. N0) invasive lobular carcinoma of the left breast status post lumpectomy and sentinel lymph node biopsy. The final pathology reveals a ER/PR positive, repeat HER-2/neu pending with a Ki-67 of 58%. Patient's HER-2/neu on biopsy was positive. Question is whether to treat the patient with chemotherapy. However I have requested pathology to repeat the HER-2/neu on this particular patient to 2 with lobular features. If the HER-2/neu is negative then we will do an Oncotype DX testing to see if she indeed does need this chemotherapy versus going on to radiation and then antiestrogen therapy.  #2 patient also has significant dysfunctional uterine bleeding I have recommended that we try Zoladex to see if we can just stop the bleeding with this. She is not able to go on hormone replacement therapy due to her diagnosis of breast cancer. Question was whether she could have a IUD placed. If Zoladex does not work then it would be  absolutely necessary from oncology perspective to have a IUD placed to help patient with dysfunctional uterine bleeding.  #3 HER-2 nu testing on the final specimen was negative. I have therefore recommended patient proceed with radiation therapy. Once she completes radiation then she will go on to receive an aromatase inhibitor such as Arimidex 1 mg daily for a total of 5 years.   PLAN:  #1 patient's repeat HER-2/neu testing was negative. I discussed this with the patient and the implications.  #2 she will proceed with radiation therapy.  #3 I will see her back in 2 months time for followup.  All questions were answered. The patient knows to call the clinic with any problems, questions or concerns. We can certainly see the patient much sooner if necessary.  I spent 25 minutes counseling the patient face to face. The total time spent in the appointment was 30 minutes.    Drue Second, MD Medical/Oncology [facility] [phone] (beeper) [phone] (Office)

## 2012-10-23 ENCOUNTER — Ambulatory Visit
Admission: RE | Admit: 2012-10-23 | Discharge: 2012-10-23 | Disposition: A | Discharge status: Home / Self Care | Payer: Medicare Other | Source: Ambulatory Visit | Attending: Radiation Oncology | Admitting: Radiation Oncology

## 2012-10-23 DIAGNOSIS — L299 Pruritus, unspecified: Secondary | ICD-10-CM | POA: Diagnosis not present

## 2012-10-23 DIAGNOSIS — C50119 Malignant neoplasm of central portion of unspecified female breast: Secondary | ICD-10-CM | POA: Diagnosis not present

## 2012-10-23 DIAGNOSIS — Z510 Encounter for antineoplastic radiation therapy: Secondary | ICD-10-CM | POA: Diagnosis not present

## 2012-10-23 DIAGNOSIS — L819 Disorder of pigmentation, unspecified: Secondary | ICD-10-CM | POA: Diagnosis not present

## 2012-10-23 DIAGNOSIS — L539 Erythematous condition, unspecified: Secondary | ICD-10-CM | POA: Diagnosis not present

## 2012-10-23 DIAGNOSIS — R5381 Other malaise: Secondary | ICD-10-CM | POA: Diagnosis not present

## 2012-10-24 ENCOUNTER — Ambulatory Visit
Admission: RE | Admit: 2012-10-24 | Discharge: 2012-10-24 | Disposition: A | Discharge status: Home / Self Care | Payer: Medicare Other | Source: Ambulatory Visit | Attending: Radiation Oncology | Admitting: Radiation Oncology

## 2012-10-24 DIAGNOSIS — C50119 Malignant neoplasm of central portion of unspecified female breast: Secondary | ICD-10-CM | POA: Diagnosis not present

## 2012-10-24 DIAGNOSIS — Z510 Encounter for antineoplastic radiation therapy: Secondary | ICD-10-CM | POA: Diagnosis not present

## 2012-10-24 DIAGNOSIS — L539 Erythematous condition, unspecified: Secondary | ICD-10-CM | POA: Diagnosis not present

## 2012-10-24 DIAGNOSIS — L299 Pruritus, unspecified: Secondary | ICD-10-CM | POA: Diagnosis not present

## 2012-10-24 DIAGNOSIS — R5381 Other malaise: Secondary | ICD-10-CM | POA: Diagnosis not present

## 2012-10-24 DIAGNOSIS — L819 Disorder of pigmentation, unspecified: Secondary | ICD-10-CM | POA: Diagnosis not present

## 2012-10-27 ENCOUNTER — Ambulatory Visit
Admission: RE | Admit: 2012-10-27 | Discharge: 2012-10-27 | Disposition: A | Discharge status: Home / Self Care | Payer: Medicare Other | Source: Ambulatory Visit | Attending: Radiation Oncology | Admitting: Radiation Oncology

## 2012-10-27 DIAGNOSIS — L819 Disorder of pigmentation, unspecified: Secondary | ICD-10-CM | POA: Diagnosis not present

## 2012-10-27 DIAGNOSIS — L539 Erythematous condition, unspecified: Secondary | ICD-10-CM | POA: Diagnosis not present

## 2012-10-27 DIAGNOSIS — C50119 Malignant neoplasm of central portion of unspecified female breast: Secondary | ICD-10-CM | POA: Diagnosis not present

## 2012-10-27 DIAGNOSIS — Z510 Encounter for antineoplastic radiation therapy: Secondary | ICD-10-CM | POA: Diagnosis not present

## 2012-10-27 DIAGNOSIS — R5381 Other malaise: Secondary | ICD-10-CM | POA: Diagnosis not present

## 2012-10-27 DIAGNOSIS — L299 Pruritus, unspecified: Secondary | ICD-10-CM | POA: Diagnosis not present

## 2012-10-28 ENCOUNTER — Ambulatory Visit
Admission: RE | Admit: 2012-10-28 | Discharge: 2012-10-28 | Disposition: A | Discharge status: Home / Self Care | Payer: Medicare Other | Source: Ambulatory Visit | Attending: Radiation Oncology | Admitting: Radiation Oncology

## 2012-10-28 ENCOUNTER — Encounter: Payer: Self-pay | Admitting: Radiation Oncology

## 2012-10-28 VITALS — BP 142/65 | HR 71 | Temp 98.0°F | Resp 20 | Wt 182.3 lb

## 2012-10-28 DIAGNOSIS — R5383 Other fatigue: Secondary | ICD-10-CM | POA: Diagnosis not present

## 2012-10-28 DIAGNOSIS — L819 Disorder of pigmentation, unspecified: Secondary | ICD-10-CM | POA: Diagnosis not present

## 2012-10-28 DIAGNOSIS — C50112 Malignant neoplasm of central portion of left female breast: Secondary | ICD-10-CM

## 2012-10-28 DIAGNOSIS — L539 Erythematous condition, unspecified: Secondary | ICD-10-CM | POA: Diagnosis not present

## 2012-10-28 DIAGNOSIS — Z510 Encounter for antineoplastic radiation therapy: Secondary | ICD-10-CM | POA: Diagnosis not present

## 2012-10-28 DIAGNOSIS — C50119 Malignant neoplasm of central portion of unspecified female breast: Secondary | ICD-10-CM | POA: Diagnosis not present

## 2012-10-28 DIAGNOSIS — L299 Pruritus, unspecified: Secondary | ICD-10-CM | POA: Diagnosis not present

## 2012-10-28 NOTE — Progress Notes (Signed)
Pt denies pain, fatigue, loss of appetite. She states her skin in tx area is slightly pink. She is applying Radiaplex lotion.

## 2012-10-28 NOTE — Progress Notes (Signed)
[facility]    Radiation Oncology [address] [place]     Maryln Gottron, M.D. [place], [place] [address]               Billie Lade, M.D., Ph.D. Phone: [phone]      Molli Hazard A. Kathrynn Running, M.D. Fax: [phone]      Radene Gunning, M.D., Ph.D.         Lurline Hare, M.D.         Grayland Jack, M.D Weekly Treatment Management Note  Name: [name]     MRN: [id number]        CSN: [id number] Date: 10/28/2012      DOB: Feb 26, 1946  CC: [name], MD         Katrinka Blazing    Status: Outpatient  Diagnosis: The encounter diagnosis was Cancer of central portion of female breast, left.  Current Dose: 12.6 GY  Current Fraction: 7  Planned Dose: 62.4 Gy  Narrative: LATARSHA ZANI was seen today for weekly treatment management. The chart was checked and port films  were reviewed. She is tolerating her treatments well at this time. She denies any itching or discomfort in the breast or fatigue.  Adhesive  Current Outpatient Prescriptions  Medication Sig Dispense Refill  . aspirin 81 MG tablet Take 81 mg by mouth daily.      . calcium citrate-vitamin D (CITRACAL+D) 315-200 MG-UNIT per tablet Take 1 tablet by mouth 2 (two) times daily. 600 mg. Calcium, 400 mg. Vit. D3      . Cyanocobalamin (VITAMIN B-12 CR PO) Take 500 mcg by mouth daily.       . fluticasone (FLONASE) 50 MCG/ACT nasal spray Place 4 sprays into the nose daily. As needed      . hyaluronate sodium (RADIAPLEXRX) GEL Apply topically 2 (two) times daily.      Marland Kitchen lisinopril-hydrochlorothiazide (PRINZIDE,ZESTORETIC) 10-12.5 MG per tablet Take 0.5 tablets by mouth daily.  90 tablet  1  . non-metallic deodorant (ALRA) MISC Apply 1 application topically daily as needed.      . Omega-3 Fatty Acids (FISH OIL) 1000 MG CAPS Take 1 capsule by mouth daily.      . vitamin C (ASCORBIC ACID) 500 MG tablet Take 1,000 mg by mouth daily.       . Zinc 50 MG TABS Take 1 tablet by mouth daily.      . [DISCONTINUED] LISINOPRIL  PO Take 1 tablet by mouth daily.       No current facility-administered medications for this encounter.   Labs:    Physical Examination:  weight is 182 lb 4.8 oz (82.691 kg). Her oral temperature is 98 F (36.7 C). Her blood pressure is 142/65 and her pulse is 71. Her respiration is 20.    Wt Readings from Last 3 Encounters:  10/28/12 182 lb 4.8 oz (82.691 kg)  10/21/12 179 lb (81.194 kg)  10/21/12 181 lb 1.6 oz (82.146 kg)    The left breast area shows some mild erythema. Lungs - Normal respiratory effort, chest expands symmetrically. Lungs are clear to auscultation, no crackles or wheezes.  Heart has regular rhythm and rate  Abdomen is soft and non tender with normal bowel sounds  Assessment:  Patient tolerating treatments well  Plan: Continue treatment per original radiation prescription

## 2012-10-29 ENCOUNTER — Ambulatory Visit
Admission: RE | Admit: 2012-10-29 | Discharge: 2012-10-29 | Disposition: A | Discharge status: Home / Self Care | Payer: Medicare Other | Source: Ambulatory Visit | Attending: Radiation Oncology | Admitting: Radiation Oncology

## 2012-10-29 DIAGNOSIS — Z510 Encounter for antineoplastic radiation therapy: Secondary | ICD-10-CM | POA: Diagnosis not present

## 2012-10-29 DIAGNOSIS — C50119 Malignant neoplasm of central portion of unspecified female breast: Secondary | ICD-10-CM | POA: Diagnosis not present

## 2012-10-29 DIAGNOSIS — L819 Disorder of pigmentation, unspecified: Secondary | ICD-10-CM | POA: Diagnosis not present

## 2012-10-29 DIAGNOSIS — R5383 Other fatigue: Secondary | ICD-10-CM | POA: Diagnosis not present

## 2012-10-29 DIAGNOSIS — L539 Erythematous condition, unspecified: Secondary | ICD-10-CM | POA: Diagnosis not present

## 2012-10-29 DIAGNOSIS — L299 Pruritus, unspecified: Secondary | ICD-10-CM | POA: Diagnosis not present

## 2012-10-30 ENCOUNTER — Ambulatory Visit
Admission: RE | Admit: 2012-10-30 | Discharge: 2012-10-30 | Disposition: A | Discharge status: Home / Self Care | Payer: Medicare Other | Source: Ambulatory Visit | Attending: Radiation Oncology | Admitting: Radiation Oncology

## 2012-10-30 DIAGNOSIS — Z510 Encounter for antineoplastic radiation therapy: Secondary | ICD-10-CM | POA: Diagnosis not present

## 2012-10-30 DIAGNOSIS — R5383 Other fatigue: Secondary | ICD-10-CM | POA: Diagnosis not present

## 2012-10-30 DIAGNOSIS — L819 Disorder of pigmentation, unspecified: Secondary | ICD-10-CM | POA: Diagnosis not present

## 2012-10-30 DIAGNOSIS — L539 Erythematous condition, unspecified: Secondary | ICD-10-CM | POA: Diagnosis not present

## 2012-10-30 DIAGNOSIS — L299 Pruritus, unspecified: Secondary | ICD-10-CM | POA: Diagnosis not present

## 2012-10-30 DIAGNOSIS — C50119 Malignant neoplasm of central portion of unspecified female breast: Secondary | ICD-10-CM | POA: Diagnosis not present

## 2012-10-31 ENCOUNTER — Ambulatory Visit: Payer: Medicare Other

## 2012-10-31 ENCOUNTER — Ambulatory Visit
Admission: RE | Admit: 2012-10-31 | Discharge: 2012-10-31 | Disposition: A | Discharge status: Home / Self Care | Payer: Medicare Other | Source: Ambulatory Visit | Attending: Radiation Oncology | Admitting: Radiation Oncology

## 2012-10-31 DIAGNOSIS — C50119 Malignant neoplasm of central portion of unspecified female breast: Secondary | ICD-10-CM | POA: Diagnosis not present

## 2012-10-31 DIAGNOSIS — L819 Disorder of pigmentation, unspecified: Secondary | ICD-10-CM | POA: Diagnosis not present

## 2012-10-31 DIAGNOSIS — L539 Erythematous condition, unspecified: Secondary | ICD-10-CM | POA: Diagnosis not present

## 2012-10-31 DIAGNOSIS — Z510 Encounter for antineoplastic radiation therapy: Secondary | ICD-10-CM | POA: Diagnosis not present

## 2012-10-31 DIAGNOSIS — R5381 Other malaise: Secondary | ICD-10-CM | POA: Diagnosis not present

## 2012-10-31 DIAGNOSIS — L299 Pruritus, unspecified: Secondary | ICD-10-CM | POA: Diagnosis not present

## 2012-11-03 ENCOUNTER — Ambulatory Visit
Admission: RE | Admit: 2012-11-03 | Discharge: 2012-11-03 | Disposition: A | Discharge status: Home / Self Care | Payer: Medicare Other | Source: Ambulatory Visit | Attending: Radiation Oncology | Admitting: Radiation Oncology

## 2012-11-03 DIAGNOSIS — Z510 Encounter for antineoplastic radiation therapy: Secondary | ICD-10-CM | POA: Diagnosis not present

## 2012-11-03 DIAGNOSIS — C50119 Malignant neoplasm of central portion of unspecified female breast: Secondary | ICD-10-CM | POA: Diagnosis not present

## 2012-11-03 DIAGNOSIS — L299 Pruritus, unspecified: Secondary | ICD-10-CM | POA: Diagnosis not present

## 2012-11-03 DIAGNOSIS — L819 Disorder of pigmentation, unspecified: Secondary | ICD-10-CM | POA: Diagnosis not present

## 2012-11-03 DIAGNOSIS — R5381 Other malaise: Secondary | ICD-10-CM | POA: Diagnosis not present

## 2012-11-03 DIAGNOSIS — L539 Erythematous condition, unspecified: Secondary | ICD-10-CM | POA: Diagnosis not present

## 2012-11-04 ENCOUNTER — Encounter: Payer: Self-pay | Admitting: Radiation Oncology

## 2012-11-04 ENCOUNTER — Ambulatory Visit
Admission: RE | Admit: 2012-11-04 | Discharge: 2012-11-04 | Disposition: A | Discharge status: Home / Self Care | Payer: Medicare Other | Source: Ambulatory Visit | Attending: Radiation Oncology | Admitting: Radiation Oncology

## 2012-11-04 VITALS — BP 142/64 | HR 68 | Temp 97.5°F | Resp 20 | Wt 181.6 lb

## 2012-11-04 DIAGNOSIS — C50119 Malignant neoplasm of central portion of unspecified female breast: Secondary | ICD-10-CM | POA: Diagnosis not present

## 2012-11-04 DIAGNOSIS — Z510 Encounter for antineoplastic radiation therapy: Secondary | ICD-10-CM | POA: Diagnosis not present

## 2012-11-04 DIAGNOSIS — C50112 Malignant neoplasm of central portion of left female breast: Secondary | ICD-10-CM

## 2012-11-04 DIAGNOSIS — L299 Pruritus, unspecified: Secondary | ICD-10-CM | POA: Diagnosis not present

## 2012-11-04 DIAGNOSIS — L819 Disorder of pigmentation, unspecified: Secondary | ICD-10-CM | POA: Diagnosis not present

## 2012-11-04 DIAGNOSIS — R5381 Other malaise: Secondary | ICD-10-CM | POA: Diagnosis not present

## 2012-11-04 DIAGNOSIS — L539 Erythematous condition, unspecified: Secondary | ICD-10-CM | POA: Diagnosis not present

## 2012-11-04 NOTE — Progress Notes (Signed)
[facility]    Radiation Oncology [address] [place]     Maryln Gottron, M.D. [place], [place] [address]               Billie Lade, M.D., Ph.D. Phone: [phone]      Molli Hazard A. Kathrynn Running, M.D. Fax: [phone]      Radene Gunning, M.D., Ph.D.         Lurline Hare, M.D.         Grayland Jack, M.D Weekly Treatment Management Note  Name: [name]     MRN: [id number]        CSN: [id number] Date: 11/04/2012      DOB: [birth year]  CC: [name], MD         Katrinka Blazing    Status: Outpatient  Diagnosis: The encounter diagnosis was Cancer of central portion of female breast, left.  Current Dose: 21.6 Gy  Current Fraction: 12  Planned Dose: 62.4 Gy  Narrative: [name] was seen today for weekly treatment management. The chart was checked and port films  were reviewed. She is tolerating her treatments well. She has mild fatigue and some mild soreness in the nipple area.  Adhesive Current Outpatient Prescriptions  Medication Sig Dispense Refill  . aspirin 81 MG tablet Take 81 mg by mouth daily.      . calcium citrate-vitamin D (CITRACAL+D) 315-200 MG-UNIT per tablet Take 1 tablet by mouth 2 (two) times daily. 600 mg. Calcium, 400 mg. Vit. D3      . Cyanocobalamin (VITAMIN B-12 CR PO) Take 500 mcg by mouth daily.       . fluticasone (FLONASE) 50 MCG/ACT nasal spray Place 4 sprays into the nose daily. As needed      . hyaluronate sodium (RADIAPLEXRX) GEL Apply topically 2 (two) times daily.      Marland Kitchen lisinopril-hydrochlorothiazide (PRINZIDE,ZESTORETIC) 10-12.5 MG per tablet Take 0.5 tablets by mouth daily.  90 tablet  1  . non-metallic deodorant (ALRA) MISC Apply 1 application topically daily as needed.      . Omega-3 Fatty Acids (FISH OIL) 1000 MG CAPS Take 1 capsule by mouth daily.      . vitamin C (ASCORBIC ACID) 500 MG tablet Take 1,000 mg by mouth daily.       . Zinc 50 MG TABS Take 1 tablet by mouth daily.      . [DISCONTINUED] LISINOPRIL PO Take 1  tablet by mouth daily.       No current facility-administered medications for this encounter.    Physical Examination:  weight is 181 lb 9.6 oz (82.373 kg). Her oral temperature is 97.5 F (36.4 C). Her blood pressure is 142/64 and her pulse is 68. Her respiration is 20.    Wt Readings from Last 3 Encounters:  11/04/12 181 lb 9.6 oz (82.373 kg)  10/28/12 182 lb 4.8 oz (82.691 kg)  10/21/12 179 lb (81.194 kg)    The left breast area shows some hyperpigmentation changes and erythema. Lungs - Normal respiratory effort, chest expands symmetrically. Lungs are clear to auscultation, no crackles or wheezes.  Heart has regular rhythm and rate  Abdomen is soft and non tender with normal bowel sounds  Assessment:  Patient tolerating treatments well  Plan: Continue treatment per original radiation prescription

## 2012-11-04 NOTE — Progress Notes (Signed)
Pt denies pain, fatigue, loss of appetite. She is applying Radiaplex to left breast treatment area, has slight pinkness.

## 2012-11-05 ENCOUNTER — Ambulatory Visit
Admission: RE | Admit: 2012-11-05 | Discharge: 2012-11-05 | Disposition: A | Discharge status: Home / Self Care | Payer: Medicare Other | Source: Ambulatory Visit | Attending: Radiation Oncology | Admitting: Radiation Oncology

## 2012-11-05 DIAGNOSIS — C50119 Malignant neoplasm of central portion of unspecified female breast: Secondary | ICD-10-CM | POA: Diagnosis not present

## 2012-11-05 DIAGNOSIS — L299 Pruritus, unspecified: Secondary | ICD-10-CM | POA: Diagnosis not present

## 2012-11-05 DIAGNOSIS — L819 Disorder of pigmentation, unspecified: Secondary | ICD-10-CM | POA: Diagnosis not present

## 2012-11-05 DIAGNOSIS — R5381 Other malaise: Secondary | ICD-10-CM | POA: Diagnosis not present

## 2012-11-05 DIAGNOSIS — Z510 Encounter for antineoplastic radiation therapy: Secondary | ICD-10-CM | POA: Diagnosis not present

## 2012-11-05 DIAGNOSIS — L539 Erythematous condition, unspecified: Secondary | ICD-10-CM | POA: Diagnosis not present

## 2012-11-06 ENCOUNTER — Ambulatory Visit
Admission: RE | Admit: 2012-11-06 | Discharge: 2012-11-06 | Disposition: A | Discharge status: Home / Self Care | Payer: Medicare Other | Source: Ambulatory Visit | Attending: Radiation Oncology | Admitting: Radiation Oncology

## 2012-11-06 DIAGNOSIS — C50119 Malignant neoplasm of central portion of unspecified female breast: Secondary | ICD-10-CM | POA: Diagnosis not present

## 2012-11-06 DIAGNOSIS — Z510 Encounter for antineoplastic radiation therapy: Secondary | ICD-10-CM | POA: Diagnosis not present

## 2012-11-06 DIAGNOSIS — L299 Pruritus, unspecified: Secondary | ICD-10-CM | POA: Diagnosis not present

## 2012-11-06 DIAGNOSIS — L819 Disorder of pigmentation, unspecified: Secondary | ICD-10-CM | POA: Diagnosis not present

## 2012-11-06 DIAGNOSIS — L539 Erythematous condition, unspecified: Secondary | ICD-10-CM | POA: Diagnosis not present

## 2012-11-06 DIAGNOSIS — R5381 Other malaise: Secondary | ICD-10-CM | POA: Diagnosis not present

## 2012-11-06 NOTE — Telephone Encounter (Signed)
done

## 2012-11-07 ENCOUNTER — Ambulatory Visit
Admission: RE | Admit: 2012-11-07 | Discharge: 2012-11-07 | Disposition: A | Discharge status: Home / Self Care | Payer: Medicare Other | Source: Ambulatory Visit | Attending: Radiation Oncology | Admitting: Radiation Oncology

## 2012-11-07 DIAGNOSIS — C50119 Malignant neoplasm of central portion of unspecified female breast: Secondary | ICD-10-CM | POA: Diagnosis not present

## 2012-11-07 DIAGNOSIS — L539 Erythematous condition, unspecified: Secondary | ICD-10-CM | POA: Diagnosis not present

## 2012-11-07 DIAGNOSIS — R5381 Other malaise: Secondary | ICD-10-CM | POA: Diagnosis not present

## 2012-11-07 DIAGNOSIS — Z510 Encounter for antineoplastic radiation therapy: Secondary | ICD-10-CM | POA: Diagnosis not present

## 2012-11-07 DIAGNOSIS — L299 Pruritus, unspecified: Secondary | ICD-10-CM | POA: Diagnosis not present

## 2012-11-07 DIAGNOSIS — L819 Disorder of pigmentation, unspecified: Secondary | ICD-10-CM | POA: Diagnosis not present

## 2012-11-10 ENCOUNTER — Ambulatory Visit
Admission: RE | Admit: 2012-11-10 | Discharge: 2012-11-10 | Disposition: A | Discharge status: Home / Self Care | Payer: Medicare Other | Source: Ambulatory Visit | Attending: Radiation Oncology | Admitting: Radiation Oncology

## 2012-11-10 DIAGNOSIS — Z510 Encounter for antineoplastic radiation therapy: Secondary | ICD-10-CM | POA: Diagnosis not present

## 2012-11-10 DIAGNOSIS — L299 Pruritus, unspecified: Secondary | ICD-10-CM | POA: Diagnosis not present

## 2012-11-10 DIAGNOSIS — L819 Disorder of pigmentation, unspecified: Secondary | ICD-10-CM | POA: Diagnosis not present

## 2012-11-10 DIAGNOSIS — L539 Erythematous condition, unspecified: Secondary | ICD-10-CM | POA: Diagnosis not present

## 2012-11-10 DIAGNOSIS — C50119 Malignant neoplasm of central portion of unspecified female breast: Secondary | ICD-10-CM | POA: Diagnosis not present

## 2012-11-10 DIAGNOSIS — R5381 Other malaise: Secondary | ICD-10-CM | POA: Diagnosis not present

## 2012-11-11 ENCOUNTER — Ambulatory Visit
Admission: RE | Admit: 2012-11-11 | Discharge: 2012-11-11 | Disposition: A | Discharge status: Home / Self Care | Payer: Medicare Other | Source: Ambulatory Visit | Attending: Radiation Oncology | Admitting: Radiation Oncology

## 2012-11-11 ENCOUNTER — Encounter: Payer: Self-pay | Admitting: Radiation Oncology

## 2012-11-11 VITALS — BP 143/65 | HR 71 | Temp 97.5°F | Resp 20 | Wt 179.6 lb

## 2012-11-11 DIAGNOSIS — L539 Erythematous condition, unspecified: Secondary | ICD-10-CM | POA: Diagnosis not present

## 2012-11-11 DIAGNOSIS — C50112 Malignant neoplasm of central portion of left female breast: Secondary | ICD-10-CM

## 2012-11-11 DIAGNOSIS — L819 Disorder of pigmentation, unspecified: Secondary | ICD-10-CM | POA: Diagnosis not present

## 2012-11-11 DIAGNOSIS — L299 Pruritus, unspecified: Secondary | ICD-10-CM | POA: Diagnosis not present

## 2012-11-11 DIAGNOSIS — R5381 Other malaise: Secondary | ICD-10-CM | POA: Diagnosis not present

## 2012-11-11 DIAGNOSIS — Z510 Encounter for antineoplastic radiation therapy: Secondary | ICD-10-CM | POA: Diagnosis not present

## 2012-11-11 DIAGNOSIS — C50119 Malignant neoplasm of central portion of unspecified female breast: Secondary | ICD-10-CM | POA: Diagnosis not present

## 2012-11-11 NOTE — Progress Notes (Signed)
Pt denies pain, loss of appetite. She states she had less fatigue yesterday than she has had. Pt c/o itching x 1 in upper left breast area. Advised she may apply 1% Cortisone which she has at home. Pt applying Radiaplex to left breast treatment area for hyperpigmentation.

## 2012-11-11 NOTE — Progress Notes (Signed)
[facility]    Radiation Oncology [address] [place]     Maryln Gottron, M.D. [place], [place] [address]               Billie Lade, M.D., Ph.D. Phone: [phone]      Molli Hazard A. Kathrynn Running, M.D. Fax: [phone]      Radene Gunning, M.D., Ph.D.         Lurline Hare, M.D.         Grayland Jack, M.D Weekly Treatment Management Note  Name: [name]     MRN: [id number]        CSN: [id number] Date: 11/11/2012      DOB: January 31, 1946  CC: [name], MD         Katrinka Blazing    Status: Outpatient  Diagnosis: The encounter diagnosis was Cancer of central portion of female breast, left.  Current Dose: 30.5 Gy  Current Fraction: 17  Planned Dose: 62.4 Gy  Narrative: [name] was seen today for weekly treatment management. The chart was checked and port films  were reviewed. She has noticed some itching in the upper inner aspect of the breast but otherwise is doing well with her treatment.  Adhesive  Current Outpatient Prescriptions  Medication Sig Dispense Refill  . aspirin 81 MG tablet Take 81 mg by mouth daily.      . calcium citrate-vitamin D (CITRACAL+D) 315-200 MG-UNIT per tablet Take 1 tablet by mouth 2 (two) times daily. 600 mg. Calcium, 400 mg. Vit. D3      . Cyanocobalamin (VITAMIN B-12 CR PO) Take 500 mcg by mouth daily.       . fluticasone (FLONASE) 50 MCG/ACT nasal spray Place 4 sprays into the nose daily. As needed      . hyaluronate sodium (RADIAPLEXRX) GEL Apply topically 2 (two) times daily.      Marland Kitchen lisinopril-hydrochlorothiazide (PRINZIDE,ZESTORETIC) 10-12.5 MG per tablet Take 0.5 tablets by mouth daily.  90 tablet  1  . non-metallic deodorant (ALRA) MISC Apply 1 application topically daily as needed.      . Omega-3 Fatty Acids (FISH OIL) 1000 MG CAPS Take 1 capsule by mouth daily.      . vitamin C (ASCORBIC ACID) 500 MG tablet Take 1,000 mg by mouth daily.       . Zinc 50 MG TABS Take 1 tablet by mouth daily.      . [DISCONTINUED]  LISINOPRIL PO Take 1 tablet by mouth daily.       No current facility-administered medications for this encounter.   Labs:    Physical Examination:  weight is 179 lb 9.6 oz (81.466 kg). Her oral temperature is 97.5 F (36.4 C). Her blood pressure is 143/65 and her pulse is 71. Her respiration is 20.    Wt Readings from Last 3 Encounters:  11/11/12 179 lb 9.6 oz (81.466 kg)  11/04/12 181 lb 9.6 oz (82.373 kg)  10/28/12 182 lb 4.8 oz (82.691 kg)    The left breast area shows some hyperpigmentation changes and erythema but no dry or moist desquamation Lungs - Normal respiratory effort, chest expands symmetrically. Lungs are clear to auscultation, no crackles or wheezes.  Heart has regular rhythm and rate  Abdomen is soft and non tender with normal bowel sounds  Assessment:  Patient tolerating treatments well  Plan: Continue treatment per original radiation prescription

## 2012-11-12 ENCOUNTER — Ambulatory Visit
Admission: RE | Admit: 2012-11-12 | Discharge: 2012-11-12 | Disposition: A | Discharge status: Home / Self Care | Payer: Medicare Other | Source: Ambulatory Visit | Attending: Radiation Oncology | Admitting: Radiation Oncology

## 2012-11-12 DIAGNOSIS — L819 Disorder of pigmentation, unspecified: Secondary | ICD-10-CM | POA: Diagnosis not present

## 2012-11-12 DIAGNOSIS — L539 Erythematous condition, unspecified: Secondary | ICD-10-CM | POA: Diagnosis not present

## 2012-11-12 DIAGNOSIS — R5381 Other malaise: Secondary | ICD-10-CM | POA: Diagnosis not present

## 2012-11-12 DIAGNOSIS — C50119 Malignant neoplasm of central portion of unspecified female breast: Secondary | ICD-10-CM | POA: Diagnosis not present

## 2012-11-12 DIAGNOSIS — L299 Pruritus, unspecified: Secondary | ICD-10-CM | POA: Diagnosis not present

## 2012-11-12 DIAGNOSIS — Z510 Encounter for antineoplastic radiation therapy: Secondary | ICD-10-CM | POA: Diagnosis not present

## 2012-11-13 ENCOUNTER — Ambulatory Visit
Admission: RE | Admit: 2012-11-13 | Discharge: 2012-11-13 | Disposition: A | Discharge status: Home / Self Care | Payer: Medicare Other | Source: Ambulatory Visit | Attending: Radiation Oncology | Admitting: Radiation Oncology

## 2012-11-13 DIAGNOSIS — C50119 Malignant neoplasm of central portion of unspecified female breast: Secondary | ICD-10-CM | POA: Diagnosis not present

## 2012-11-13 DIAGNOSIS — C50112 Malignant neoplasm of central portion of left female breast: Secondary | ICD-10-CM

## 2012-11-13 DIAGNOSIS — L539 Erythematous condition, unspecified: Secondary | ICD-10-CM | POA: Diagnosis not present

## 2012-11-13 DIAGNOSIS — L819 Disorder of pigmentation, unspecified: Secondary | ICD-10-CM | POA: Diagnosis not present

## 2012-11-13 DIAGNOSIS — L299 Pruritus, unspecified: Secondary | ICD-10-CM | POA: Diagnosis not present

## 2012-11-13 DIAGNOSIS — R5381 Other malaise: Secondary | ICD-10-CM | POA: Diagnosis not present

## 2012-11-13 DIAGNOSIS — Z510 Encounter for antineoplastic radiation therapy: Secondary | ICD-10-CM | POA: Diagnosis not present

## 2012-11-13 MED ORDER — RADIAPLEXRX EX GEL
Freq: Once | CUTANEOUS | Status: AC
  Administered 2012-11-13: 08:00:00 via TOPICAL

## 2012-11-14 ENCOUNTER — Ambulatory Visit
Admission: RE | Admit: 2012-11-14 | Discharge: 2012-11-14 | Disposition: A | Discharge status: Home / Self Care | Payer: Medicare Other | Source: Ambulatory Visit | Attending: Radiation Oncology | Admitting: Radiation Oncology

## 2012-11-14 DIAGNOSIS — R5381 Other malaise: Secondary | ICD-10-CM | POA: Diagnosis not present

## 2012-11-14 DIAGNOSIS — Z510 Encounter for antineoplastic radiation therapy: Secondary | ICD-10-CM | POA: Diagnosis not present

## 2012-11-14 DIAGNOSIS — C50119 Malignant neoplasm of central portion of unspecified female breast: Secondary | ICD-10-CM | POA: Diagnosis not present

## 2012-11-14 DIAGNOSIS — L819 Disorder of pigmentation, unspecified: Secondary | ICD-10-CM | POA: Diagnosis not present

## 2012-11-14 DIAGNOSIS — L299 Pruritus, unspecified: Secondary | ICD-10-CM | POA: Diagnosis not present

## 2012-11-14 DIAGNOSIS — L539 Erythematous condition, unspecified: Secondary | ICD-10-CM | POA: Diagnosis not present

## 2012-11-18 ENCOUNTER — Ambulatory Visit
Admission: RE | Admit: 2012-11-18 | Discharge: 2012-11-18 | Disposition: A | Discharge status: Home / Self Care | Payer: Medicare Other | Source: Ambulatory Visit | Attending: Radiation Oncology | Admitting: Radiation Oncology

## 2012-11-18 DIAGNOSIS — R5381 Other malaise: Secondary | ICD-10-CM | POA: Diagnosis not present

## 2012-11-18 DIAGNOSIS — C50119 Malignant neoplasm of central portion of unspecified female breast: Secondary | ICD-10-CM | POA: Diagnosis not present

## 2012-11-18 DIAGNOSIS — L539 Erythematous condition, unspecified: Secondary | ICD-10-CM | POA: Diagnosis not present

## 2012-11-18 DIAGNOSIS — L819 Disorder of pigmentation, unspecified: Secondary | ICD-10-CM | POA: Diagnosis not present

## 2012-11-18 DIAGNOSIS — Z510 Encounter for antineoplastic radiation therapy: Secondary | ICD-10-CM | POA: Diagnosis not present

## 2012-11-18 DIAGNOSIS — L299 Pruritus, unspecified: Secondary | ICD-10-CM | POA: Diagnosis not present

## 2012-11-19 ENCOUNTER — Encounter: Payer: Self-pay | Admitting: Radiation Oncology

## 2012-11-19 ENCOUNTER — Ambulatory Visit
Admission: RE | Admit: 2012-11-19 | Discharge: 2012-11-19 | Disposition: A | Discharge status: Home / Self Care | Payer: Medicare Other | Source: Ambulatory Visit | Attending: Radiation Oncology | Admitting: Radiation Oncology

## 2012-11-19 VITALS — BP 142/63 | HR 74 | Temp 97.6°F | Resp 20 | Wt 181.6 lb

## 2012-11-19 DIAGNOSIS — C50112 Malignant neoplasm of central portion of left female breast: Secondary | ICD-10-CM

## 2012-11-19 DIAGNOSIS — L539 Erythematous condition, unspecified: Secondary | ICD-10-CM | POA: Diagnosis not present

## 2012-11-19 DIAGNOSIS — L819 Disorder of pigmentation, unspecified: Secondary | ICD-10-CM | POA: Diagnosis not present

## 2012-11-19 DIAGNOSIS — C50119 Malignant neoplasm of central portion of unspecified female breast: Secondary | ICD-10-CM | POA: Diagnosis not present

## 2012-11-19 DIAGNOSIS — L299 Pruritus, unspecified: Secondary | ICD-10-CM | POA: Diagnosis not present

## 2012-11-19 DIAGNOSIS — R5381 Other malaise: Secondary | ICD-10-CM | POA: Diagnosis not present

## 2012-11-19 DIAGNOSIS — Z510 Encounter for antineoplastic radiation therapy: Secondary | ICD-10-CM | POA: Diagnosis not present

## 2012-11-19 MED ORDER — BIAFINE EX EMUL
Freq: Two times a day (BID) | CUTANEOUS | Status: DC
  Administered 2012-11-19: 11:00:00 via TOPICAL

## 2012-11-19 NOTE — Progress Notes (Signed)
Pt denies pain, does report occasional sharp pain in left breast that is brief and feeling an occasional " burning sensation" in her left breast that is also brief. Pt somewhat fatigued, no loss of appetite. She c/o itching in upper left breast, gave pt Biafine. 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

## 2012-11-19 NOTE — Progress Notes (Signed)
[facility]    Radiation Oncology [address] [place]     Maryln Gottron, M.D. [place], [place] [address]               Billie Lade, M.D., Ph.D. Phone: [phone]      Molli Hazard A. Kathrynn Running, M.D. Fax: [phone]      Radene Gunning, M.D., Ph.D.         Lurline Hare, M.D.         Grayland Jack, M.D Weekly Treatment Management Note  Name: [name]     MRN: [id number]        CSN: [id number] Date: 11/19/2012      DOB: [birth year]  CC: [name], MD         Katrinka Blazing    Status: Outpatient  Diagnosis: The encounter diagnosis was Cancer of central portion of female breast, left.  Current Dose: 39.6 Gy  Current Fraction: 22  Planned Dose: 62.4 Gy  Narrative: [name] was seen today for weekly treatment management. The chart was checked and port films  were reviewed. She is having occasional sharp shooting pain within left breast. She also notices some itching and burning sensation within the breast. She is also having some fatigue. Patient was given Biafine today for her skin reaction.  Adhesive  Current Outpatient Prescriptions  Medication Sig Dispense Refill  . aspirin 81 MG tablet Take 81 mg by mouth daily.      . calcium citrate-vitamin D (CITRACAL+D) 315-200 MG-UNIT per tablet Take 1 tablet by mouth 2 (two) times daily. 600 mg. Calcium, 400 mg. Vit. D3      . Cyanocobalamin (VITAMIN B-12 CR PO) Take 500 mcg by mouth daily.       Marland Kitchen emollient (BIAFINE) cream Apply topically 2 (two) times daily.      . fluticasone (FLONASE) 50 MCG/ACT nasal spray Place 4 sprays into the nose daily. As needed      . hyaluronate sodium (RADIAPLEXRX) GEL Apply topically 2 (two) times daily.      Marland Kitchen lisinopril-hydrochlorothiazide (PRINZIDE,ZESTORETIC) 10-12.5 MG per tablet Take 0.5 tablets by mouth daily.  90 tablet  1  . Loratadine-Pseudoephedrine (PX ALLERGY RELIEF D, LORATID, PO) Take by mouth as needed.      . non-metallic deodorant Thornton Papas) MISC Apply 1 application  topically daily as needed.      . Omega-3 Fatty Acids (FISH OIL) 1000 MG CAPS Take 1 capsule by mouth daily.      . vitamin C (ASCORBIC ACID) 500 MG tablet Take 1,000 mg by mouth daily.       . Zinc 50 MG TABS Take 1 tablet by mouth daily.      . [DISCONTINUED] LISINOPRIL PO Take 1 tablet by mouth daily.       Current Facility-Administered Medications  Medication Dose Route Frequency Provider Last Rate Last Dose  . topical emolient (BIAFINE) emulsion   Topical BID Billie Lade, MD       Labs:    Physical Examination:  weight is 181 lb 9.6 oz (82.373 kg). Her oral temperature is 97.6 F (36.4 C). Her blood pressure is 142/63 and her pulse is 74. Her respiration is 20.    Wt Readings from Last 3 Encounters:  11/19/12 181 lb 9.6 oz (82.373 kg)  11/11/12 179 lb 9.6 oz (81.466 kg)  11/04/12 181 lb 9.6 oz (82.373 kg)   The left breast area shows hyperpigmentation changes and erythema. There is radiation dermatitis noted  in the upper inner quadrant but no moist desquamation.  Lungs - Normal respiratory effort, chest expands symmetrically. Lungs are clear to auscultation, no crackles or wheezes.  Heart has regular rhythm and rate  Abdomen is soft and non tender with normal bowel sounds  Assessment:  Patient tolerating treatments well  Plan: Continue treatment per original radiation prescription

## 2012-11-20 ENCOUNTER — Ambulatory Visit
Admission: RE | Admit: 2012-11-20 | Discharge: 2012-11-20 | Disposition: A | Discharge status: Home / Self Care | Payer: Medicare Other | Source: Ambulatory Visit | Attending: Radiation Oncology | Admitting: Radiation Oncology

## 2012-11-20 ENCOUNTER — Encounter: Payer: Self-pay | Admitting: Radiation Oncology

## 2012-11-20 ENCOUNTER — Encounter: Payer: Self-pay | Admitting: Family Medicine

## 2012-11-20 DIAGNOSIS — Z510 Encounter for antineoplastic radiation therapy: Secondary | ICD-10-CM | POA: Diagnosis not present

## 2012-11-20 DIAGNOSIS — R5381 Other malaise: Secondary | ICD-10-CM | POA: Diagnosis not present

## 2012-11-20 DIAGNOSIS — L299 Pruritus, unspecified: Secondary | ICD-10-CM | POA: Diagnosis not present

## 2012-11-20 DIAGNOSIS — L539 Erythematous condition, unspecified: Secondary | ICD-10-CM | POA: Diagnosis not present

## 2012-11-20 DIAGNOSIS — L819 Disorder of pigmentation, unspecified: Secondary | ICD-10-CM | POA: Diagnosis not present

## 2012-11-20 DIAGNOSIS — C50119 Malignant neoplasm of central portion of unspecified female breast: Secondary | ICD-10-CM | POA: Diagnosis not present

## 2012-11-20 NOTE — Progress Notes (Signed)
   Department of Radiation Oncology  Phone:  [phone] Fax:        [phone]  Electron beam simulation note  Today the patient underwent additional planning for radiation therapy directed at the left breast. The patient's treatment planning CT scan was reviewed and she had set up of a custom electron cutout field directed at the lumpectomy cavity. The patient will be treated with 12 MeV electrons prescribed to the 95% isodose line. The patient will receive 6 treatments at 2 gray per treatment for an additional dose of 12 gray. A special port plan is requested for treatment.  -----------------------------------  Billie Lade, PhD, MD

## 2012-11-21 ENCOUNTER — Ambulatory Visit
Admission: RE | Admit: 2012-11-21 | Discharge: 2012-11-21 | Disposition: A | Discharge status: Home / Self Care | Payer: Medicare Other | Source: Ambulatory Visit | Attending: Radiation Oncology | Admitting: Radiation Oncology

## 2012-11-21 DIAGNOSIS — R5381 Other malaise: Secondary | ICD-10-CM | POA: Diagnosis not present

## 2012-11-21 DIAGNOSIS — C50119 Malignant neoplasm of central portion of unspecified female breast: Secondary | ICD-10-CM | POA: Diagnosis not present

## 2012-11-21 DIAGNOSIS — L539 Erythematous condition, unspecified: Secondary | ICD-10-CM | POA: Diagnosis not present

## 2012-11-21 DIAGNOSIS — L299 Pruritus, unspecified: Secondary | ICD-10-CM | POA: Diagnosis not present

## 2012-11-21 DIAGNOSIS — Z510 Encounter for antineoplastic radiation therapy: Secondary | ICD-10-CM | POA: Diagnosis not present

## 2012-11-21 DIAGNOSIS — L819 Disorder of pigmentation, unspecified: Secondary | ICD-10-CM | POA: Diagnosis not present

## 2012-11-24 ENCOUNTER — Ambulatory Visit
Admission: RE | Admit: 2012-11-24 | Discharge: 2012-11-24 | Disposition: A | Discharge status: Home / Self Care | Payer: Medicare Other | Source: Ambulatory Visit | Attending: Radiation Oncology | Admitting: Radiation Oncology

## 2012-11-24 DIAGNOSIS — L819 Disorder of pigmentation, unspecified: Secondary | ICD-10-CM | POA: Diagnosis not present

## 2012-11-24 DIAGNOSIS — Z510 Encounter for antineoplastic radiation therapy: Secondary | ICD-10-CM | POA: Diagnosis not present

## 2012-11-24 DIAGNOSIS — R5381 Other malaise: Secondary | ICD-10-CM | POA: Diagnosis not present

## 2012-11-24 DIAGNOSIS — C50119 Malignant neoplasm of central portion of unspecified female breast: Secondary | ICD-10-CM | POA: Diagnosis not present

## 2012-11-24 DIAGNOSIS — L299 Pruritus, unspecified: Secondary | ICD-10-CM | POA: Diagnosis not present

## 2012-11-24 DIAGNOSIS — L539 Erythematous condition, unspecified: Secondary | ICD-10-CM | POA: Diagnosis not present

## 2012-11-25 ENCOUNTER — Ambulatory Visit
Admission: RE | Admit: 2012-11-25 | Discharge: 2012-11-25 | Disposition: A | Discharge status: Home / Self Care | Payer: Medicare Other | Source: Ambulatory Visit | Attending: Radiation Oncology | Admitting: Radiation Oncology

## 2012-11-25 ENCOUNTER — Encounter: Payer: Self-pay | Admitting: Radiation Oncology

## 2012-11-25 VITALS — BP 141/70 | HR 73 | Temp 97.9°F | Resp 20 | Wt 182.4 lb

## 2012-11-25 DIAGNOSIS — R5381 Other malaise: Secondary | ICD-10-CM | POA: Diagnosis not present

## 2012-11-25 DIAGNOSIS — C50119 Malignant neoplasm of central portion of unspecified female breast: Secondary | ICD-10-CM | POA: Diagnosis not present

## 2012-11-25 DIAGNOSIS — L299 Pruritus, unspecified: Secondary | ICD-10-CM | POA: Diagnosis not present

## 2012-11-25 DIAGNOSIS — L539 Erythematous condition, unspecified: Secondary | ICD-10-CM | POA: Diagnosis not present

## 2012-11-25 DIAGNOSIS — Z510 Encounter for antineoplastic radiation therapy: Secondary | ICD-10-CM | POA: Diagnosis not present

## 2012-11-25 DIAGNOSIS — C50112 Malignant neoplasm of central portion of left female breast: Secondary | ICD-10-CM

## 2012-11-25 DIAGNOSIS — L819 Disorder of pigmentation, unspecified: Secondary | ICD-10-CM | POA: Diagnosis not present

## 2012-11-25 NOTE — Progress Notes (Signed)
[facility]    Radiation Oncology [address] [place]     Maryln Gottron, M.D. [place], [place] [address]               Billie Lade, M.D., Ph.D. Phone: [phone]      Molli Hazard A. Kathrynn Running, M.D. Fax: [phone]      Radene Gunning, M.D., Ph.D.         Lurline Hare, M.D.         Grayland Jack, M.D Weekly Treatment Management Note  Name: [name]     MRN: [id number]        CSN: [id number] Date: 11/25/2012      DOB: [birth year]  CC: [name], MD         Katrinka Blazing    Status: Outpatient  Diagnosis: The encounter diagnosis was Cancer of central portion of female breast, left.  Current Dose: 46.8 Gy  Current Fraction: 26  Planned Dose: 62.4 Gy  Narrative: COLENE MINES was seen today for weekly treatment management. The chart was checked and port films  were reviewed. She has noticed some itching and discomfort in the breast area. She also has some mild fatigue.  Adhesive  Current Outpatient Prescriptions  Medication Sig Dispense Refill  . aspirin 81 MG tablet Take 81 mg by mouth daily.      . calcium citrate-vitamin D (CITRACAL+D) 315-200 MG-UNIT per tablet Take 1 tablet by mouth 2 (two) times daily. 600 mg. Calcium, 400 mg. Vit. D3      . Cyanocobalamin (VITAMIN B-12 CR PO) Take 500 mcg by mouth daily.       Marland Kitchen emollient (BIAFINE) cream Apply topically 2 (two) times daily.      . fluticasone (FLONASE) 50 MCG/ACT nasal spray Place 4 sprays into the nose daily. As needed      . hyaluronate sodium (RADIAPLEXRX) GEL Apply topically 2 (two) times daily.      Marland Kitchen lisinopril-hydrochlorothiazide (PRINZIDE,ZESTORETIC) 10-12.5 MG per tablet Take 0.5 tablets by mouth daily.  90 tablet  1  . Loratadine-Pseudoephedrine (PX ALLERGY RELIEF D, LORATID, PO) Take by mouth as needed.      . non-metallic deodorant Thornton Papas) MISC Apply 1 application topically daily as needed.      . Omega-3 Fatty Acids (FISH OIL) 1000 MG CAPS Take 1 capsule by mouth daily.      . vitamin C  (ASCORBIC ACID) 500 MG tablet Take 1,000 mg by mouth daily.       . Zinc 50 MG TABS Take 1 tablet by mouth daily.      . [DISCONTINUED] LISINOPRIL PO Take 1 tablet by mouth daily.       No current facility-administered medications for this encounter.   Labs:   Physical Examination:  weight is 182 lb 6.4 oz (82.736 kg). Her temperature is 97.9 F (36.6 C). Her blood pressure is 141/70 and her pulse is 73. Her respiration is 20.    Wt Readings from Last 3 Encounters:  11/25/12 182 lb 6.4 oz (82.736 kg)  11/19/12 181 lb 9.6 oz (82.373 kg)  11/11/12 179 lb 9.6 oz (81.466 kg)    The left breast area shows erythema hyperpigmentation changes and dry desquamation but no moist desquamation Lungs - Normal respiratory effort, chest expands symmetrically. Lungs are clear to auscultation, no crackles or wheezes.  Heart has regular rhythm and rate  Abdomen is soft and non tender with normal bowel sounds  Assessment:  Patient tolerating treatments well  Plan: Continue treatment per original radiation prescription

## 2012-11-25 NOTE — Progress Notes (Signed)
Weekly rad txs, 26/34 left breast, bright erythema dry desquamation/dermatitis on front of breast,peeling, under axilla raw looking, but skin intact, , using biafine cream 2-3x day, itching decreased, but now it just hurts" eating and drinking well 10:11 AM

## 2012-11-26 ENCOUNTER — Ambulatory Visit
Admission: RE | Admit: 2012-11-26 | Discharge: 2012-11-26 | Disposition: A | Discharge status: Home / Self Care | Payer: Medicare Other | Source: Ambulatory Visit | Attending: Radiation Oncology | Admitting: Radiation Oncology

## 2012-11-26 DIAGNOSIS — R5381 Other malaise: Secondary | ICD-10-CM | POA: Diagnosis not present

## 2012-11-26 DIAGNOSIS — L819 Disorder of pigmentation, unspecified: Secondary | ICD-10-CM | POA: Diagnosis not present

## 2012-11-26 DIAGNOSIS — Z510 Encounter for antineoplastic radiation therapy: Secondary | ICD-10-CM | POA: Diagnosis not present

## 2012-11-26 DIAGNOSIS — C50119 Malignant neoplasm of central portion of unspecified female breast: Secondary | ICD-10-CM | POA: Diagnosis not present

## 2012-11-26 DIAGNOSIS — L539 Erythematous condition, unspecified: Secondary | ICD-10-CM | POA: Diagnosis not present

## 2012-11-26 DIAGNOSIS — L299 Pruritus, unspecified: Secondary | ICD-10-CM | POA: Diagnosis not present

## 2012-11-27 ENCOUNTER — Ambulatory Visit
Admission: RE | Admit: 2012-11-27 | Discharge: 2012-11-27 | Disposition: A | Discharge status: Home / Self Care | Payer: Medicare Other | Source: Ambulatory Visit | Attending: Radiation Oncology | Admitting: Radiation Oncology

## 2012-11-27 DIAGNOSIS — L299 Pruritus, unspecified: Secondary | ICD-10-CM | POA: Diagnosis not present

## 2012-11-27 DIAGNOSIS — L539 Erythematous condition, unspecified: Secondary | ICD-10-CM | POA: Diagnosis not present

## 2012-11-27 DIAGNOSIS — C50112 Malignant neoplasm of central portion of left female breast: Secondary | ICD-10-CM

## 2012-11-27 DIAGNOSIS — Z510 Encounter for antineoplastic radiation therapy: Secondary | ICD-10-CM | POA: Diagnosis not present

## 2012-11-27 DIAGNOSIS — R5381 Other malaise: Secondary | ICD-10-CM | POA: Diagnosis not present

## 2012-11-27 DIAGNOSIS — C50119 Malignant neoplasm of central portion of unspecified female breast: Secondary | ICD-10-CM | POA: Diagnosis not present

## 2012-11-27 DIAGNOSIS — L819 Disorder of pigmentation, unspecified: Secondary | ICD-10-CM | POA: Diagnosis not present

## 2012-11-27 NOTE — Progress Notes (Signed)
   Department of Radiation Oncology  Phone:  [phone] Fax:        [phone]  Electron setup check  Today the patient's electron setup was checked on the linear accelerator. The electron fields set up well compared to her planning films.  -----------------------------------  Billie Lade, PhD, MD

## 2012-11-28 ENCOUNTER — Ambulatory Visit
Admission: RE | Admit: 2012-11-28 | Discharge: 2012-11-28 | Disposition: A | Discharge status: Home / Self Care | Payer: Medicare Other | Source: Ambulatory Visit | Attending: Radiation Oncology | Admitting: Radiation Oncology

## 2012-11-28 DIAGNOSIS — L819 Disorder of pigmentation, unspecified: Secondary | ICD-10-CM | POA: Diagnosis not present

## 2012-11-28 DIAGNOSIS — R5381 Other malaise: Secondary | ICD-10-CM | POA: Diagnosis not present

## 2012-11-28 DIAGNOSIS — L299 Pruritus, unspecified: Secondary | ICD-10-CM | POA: Diagnosis not present

## 2012-11-28 DIAGNOSIS — L539 Erythematous condition, unspecified: Secondary | ICD-10-CM | POA: Diagnosis not present

## 2012-11-28 DIAGNOSIS — C50119 Malignant neoplasm of central portion of unspecified female breast: Secondary | ICD-10-CM | POA: Diagnosis not present

## 2012-11-28 DIAGNOSIS — Z510 Encounter for antineoplastic radiation therapy: Secondary | ICD-10-CM | POA: Diagnosis not present

## 2012-12-01 ENCOUNTER — Ambulatory Visit: Payer: Medicare Other

## 2012-12-01 ENCOUNTER — Telehealth: Payer: Self-pay | Admitting: Oncology

## 2012-12-01 ENCOUNTER — Ambulatory Visit (HOSPITAL_BASED_OUTPATIENT_CLINIC_OR_DEPARTMENT_OTHER): Payer: Medicare Other | Admitting: Oncology

## 2012-12-01 ENCOUNTER — Encounter: Payer: Self-pay | Admitting: *Deleted

## 2012-12-01 ENCOUNTER — Ambulatory Visit
Admission: RE | Admit: 2012-12-01 | Discharge: 2012-12-01 | Disposition: A | Discharge status: Home / Self Care | Payer: Medicare Other | Source: Ambulatory Visit | Attending: Radiation Oncology | Admitting: Radiation Oncology

## 2012-12-01 VITALS — BP 134/79 | HR 73 | Temp 97.8°F | Resp 20 | Ht 62.75 in | Wt 180.8 lb

## 2012-12-01 DIAGNOSIS — L819 Disorder of pigmentation, unspecified: Secondary | ICD-10-CM | POA: Diagnosis not present

## 2012-12-01 DIAGNOSIS — Z510 Encounter for antineoplastic radiation therapy: Secondary | ICD-10-CM | POA: Diagnosis not present

## 2012-12-01 DIAGNOSIS — C50119 Malignant neoplasm of central portion of unspecified female breast: Secondary | ICD-10-CM

## 2012-12-01 DIAGNOSIS — L539 Erythematous condition, unspecified: Secondary | ICD-10-CM | POA: Diagnosis not present

## 2012-12-01 DIAGNOSIS — R5381 Other malaise: Secondary | ICD-10-CM | POA: Diagnosis not present

## 2012-12-01 DIAGNOSIS — L299 Pruritus, unspecified: Secondary | ICD-10-CM | POA: Diagnosis not present

## 2012-12-01 DIAGNOSIS — C50112 Malignant neoplasm of central portion of left female breast: Secondary | ICD-10-CM

## 2012-12-01 MED ORDER — ANASTROZOLE 1 MG PO TABS: 1.0000 mg | ORAL_TABLET | Freq: Every day | ORAL | Status: AC

## 2012-12-01 NOTE — Patient Instructions (Signed)
Begin arimidex on 12/17/12 1 mg daily   I will see you back in 3 months  Anastrozole tablets What is this medicine? ANASTROZOLE (an AS troe zole) is used to treat breast cancer in women who have gone through menopause. Some types of breast cancer depend on estrogen to grow, and this medicine can stop tumor growth by blocking estrogen production. This medicine may be used for other purposes; ask your health care provider or pharmacist if you have questions. What should I tell my health care provider before I take this medicine? They need to know if you have any of these conditions: -liver disease -an unusual or allergic reaction to anastrozole, other medicines, foods, dyes, or preservatives -pregnant or trying to get pregnant -breast-feeding How should I use this medicine? Take this medicine by mouth with a glass of water. Follow the directions on the prescription label. You can take this medicine with or without food. Take your doses at regular intervals. Do not take your medicine more often than directed. Do not stop taking except on the advice of your doctor or health care professional. Talk to your pediatrician regarding the use of this medicine in children. Special care may be needed. Overdosage: If you think you have taken too much of this medicine contact a poison control center or emergency room at once. NOTE: This medicine is only for you. Do not share this medicine with others. What if I miss a dose? If you miss a dose, take it as soon as you can. If it is almost time for your next dose, take only that dose. Do not take double or extra doses. What may interact with this medicine? Do not take this medicine with any of the following medications: -female hormones, like estrogens or progestins and birth control pills This medicine may also interact with the following medications: -tamoxifen This list may not describe all possible interactions. Give your health care provider a list of  all the medicines, herbs, non-prescription drugs, or dietary supplements you use. Also tell them if you smoke, drink alcohol, or use illegal drugs. Some items may interact with your medicine. What should I watch for while using this medicine? Visit your doctor or health care professional for regular checks on your progress. Let your doctor or health care professional know about any unusual vaginal bleeding. Do not treat yourself for diarrhea, nausea, vomiting or other side effects. Ask your doctor or health care professional for advice. What side effects may I notice from receiving this medicine? Side effects that you should report to your doctor or health care professional as soon as possible: -allergic reactions like skin rash, itching or hives, swelling of the face, lips, or tongue -any new or unusual symptoms -breathing problems -chest pain -leg pain or swelling -vomiting Side effects that usually do not require medical attention (report to your doctor or health care professional if they continue or are bothersome): -back or bone pain -cough, or throat infection -diarrhea or constipation -dizziness -headache -hot flashes -loss of appetite -nausea -sweating -weakness and tiredness -weight gain This list may not describe all possible side effects. Call your doctor for medical advice about side effects. You may report side effects to FDA at 1-800-FDA-1088. Where should I keep my medicine? Keep out of the reach of children. Store at room temperature between 20 and 25 degrees C (68 and 77 degrees F). Throw away any unused medicine after the expiration date. NOTE: This sheet is a summary. It may not cover all  possible information. If you have questions about this medicine, talk to your doctor, pharmacist, or health care provider.  2013, [employer]. (05/16/2007 4:31:52 PM)

## 2012-12-01 NOTE — Progress Notes (Signed)
Patient at [facility] for f/u visit with Dr. Welton Flakes.  Patient in final week of radiation therapy.  She is experiencing redness and tenderness of her breast. She is using her creams as prescribed and finds them to help relieve some discomfort.  She denies pain or fatigue.  She does report that she has been experiencing some diarrhea every 5-6 days which lasts a few hours and then is resolved.  She denies any change to her diet or medications.  She denies any abdominal pain or discomfort.  We discussed what she might anticipate in follow up appointments when radiation therapy is completed.  She denied any questions or other concerns at this time.  I encouraged her to call me for any needs.

## 2012-12-01 NOTE — Telephone Encounter (Signed)
, °

## 2012-12-02 ENCOUNTER — Ambulatory Visit
Admission: RE | Admit: 2012-12-02 | Discharge: 2012-12-02 | Disposition: A | Discharge status: Home / Self Care | Payer: Medicare Other | Source: Ambulatory Visit | Attending: Radiation Oncology | Admitting: Radiation Oncology

## 2012-12-02 VITALS — BP 130/69 | HR 72 | Temp 97.7°F | Ht 61.5 in | Wt 181.8 lb

## 2012-12-02 DIAGNOSIS — L539 Erythematous condition, unspecified: Secondary | ICD-10-CM | POA: Diagnosis not present

## 2012-12-02 DIAGNOSIS — R5381 Other malaise: Secondary | ICD-10-CM | POA: Diagnosis not present

## 2012-12-02 DIAGNOSIS — C50112 Malignant neoplasm of central portion of left female breast: Secondary | ICD-10-CM

## 2012-12-02 DIAGNOSIS — L819 Disorder of pigmentation, unspecified: Secondary | ICD-10-CM | POA: Diagnosis not present

## 2012-12-02 DIAGNOSIS — Z510 Encounter for antineoplastic radiation therapy: Secondary | ICD-10-CM | POA: Diagnosis not present

## 2012-12-02 DIAGNOSIS — C50119 Malignant neoplasm of central portion of unspecified female breast: Secondary | ICD-10-CM | POA: Diagnosis not present

## 2012-12-02 DIAGNOSIS — L299 Pruritus, unspecified: Secondary | ICD-10-CM | POA: Diagnosis not present

## 2012-12-02 MED ORDER — BIAFINE EX EMUL
Freq: Two times a day (BID) | CUTANEOUS | Status: DC
  Administered 2012-12-02: 17:00:00 via TOPICAL

## 2012-12-02 NOTE — Progress Notes (Signed)
[name] here with her husband for weekly under treat visit.  She has had 31 fractions to her left breast.  She does have occasional fatigue.  The skin on her left breast is red.  She does have an area on her left chest that is peeling and hurts.  She is applying biafine to that area and using radiaplex on the rest of her breast.  She does have an area that her bra irritated and she want to know if she can apply neosporin to the area.  She does need a refill on the biafine.  Another tube has been given.

## 2012-12-02 NOTE — Addendum Note (Signed)
Encounter addended by: Eduardo Osier, RN on: 12/02/2012  5:23 PM<BR>     Documentation filed: Inpatient MAR

## 2012-12-02 NOTE — Progress Notes (Signed)
Weekly Management Note Current Dose: 56.4  Gy  Projected Dose: 62.4 Gy   Narrative:  The patient presents for routine under treatment assessment.  CBCT/MVCT images/Port film x-rays were reviewed.  The chart was checked. Doing well. Irritation over elft breast. Using biafene. Skin tear yesterday in small area after dancing. Met with Dr. Welton Flakes yesterday and is staring antiestrogen on October 1.   Physical Findings: Weight: 181 lb 12.8 oz (82.464 kg). Dermatitis medially. Dry. Small scab.   Impression:  The patient is tolerating radiation.  Plan:  Continue treatment as planned. Continue biafene. Follow up in 1 month. Sooner prn.

## 2012-12-02 NOTE — Addendum Note (Signed)
Encounter addended by: Eduardo Osier, RN on: 12/02/2012 12:51 PM<BR>     Documentation filed: Orders

## 2012-12-03 ENCOUNTER — Ambulatory Visit
Admission: RE | Admit: 2012-12-03 | Discharge: 2012-12-03 | Disposition: A | Discharge status: Home / Self Care | Payer: Medicare Other | Source: Ambulatory Visit | Attending: Radiation Oncology | Admitting: Radiation Oncology

## 2012-12-03 DIAGNOSIS — R5381 Other malaise: Secondary | ICD-10-CM | POA: Diagnosis not present

## 2012-12-03 DIAGNOSIS — L539 Erythematous condition, unspecified: Secondary | ICD-10-CM | POA: Diagnosis not present

## 2012-12-03 DIAGNOSIS — L819 Disorder of pigmentation, unspecified: Secondary | ICD-10-CM | POA: Diagnosis not present

## 2012-12-03 DIAGNOSIS — Z510 Encounter for antineoplastic radiation therapy: Secondary | ICD-10-CM | POA: Diagnosis not present

## 2012-12-03 DIAGNOSIS — C50119 Malignant neoplasm of central portion of unspecified female breast: Secondary | ICD-10-CM | POA: Diagnosis not present

## 2012-12-03 DIAGNOSIS — L299 Pruritus, unspecified: Secondary | ICD-10-CM | POA: Diagnosis not present

## 2012-12-04 ENCOUNTER — Ambulatory Visit
Admission: RE | Admit: 2012-12-04 | Discharge: 2012-12-04 | Disposition: A | Discharge status: Home / Self Care | Payer: Medicare Other | Source: Ambulatory Visit | Attending: Radiation Oncology | Admitting: Radiation Oncology

## 2012-12-04 DIAGNOSIS — R5381 Other malaise: Secondary | ICD-10-CM | POA: Diagnosis not present

## 2012-12-04 DIAGNOSIS — L299 Pruritus, unspecified: Secondary | ICD-10-CM | POA: Diagnosis not present

## 2012-12-04 DIAGNOSIS — L539 Erythematous condition, unspecified: Secondary | ICD-10-CM | POA: Diagnosis not present

## 2012-12-04 DIAGNOSIS — Z510 Encounter for antineoplastic radiation therapy: Secondary | ICD-10-CM | POA: Diagnosis not present

## 2012-12-04 DIAGNOSIS — L819 Disorder of pigmentation, unspecified: Secondary | ICD-10-CM | POA: Diagnosis not present

## 2012-12-04 DIAGNOSIS — C50119 Malignant neoplasm of central portion of unspecified female breast: Secondary | ICD-10-CM | POA: Diagnosis not present

## 2012-12-05 ENCOUNTER — Encounter: Payer: Self-pay | Admitting: *Deleted

## 2012-12-05 ENCOUNTER — Ambulatory Visit
Admission: RE | Admit: 2012-12-05 | Discharge: 2012-12-05 | Disposition: A | Discharge status: Home / Self Care | Payer: Medicare Other | Source: Ambulatory Visit | Attending: Radiation Oncology | Admitting: Radiation Oncology

## 2012-12-05 DIAGNOSIS — L539 Erythematous condition, unspecified: Secondary | ICD-10-CM | POA: Diagnosis not present

## 2012-12-05 DIAGNOSIS — Z510 Encounter for antineoplastic radiation therapy: Secondary | ICD-10-CM | POA: Diagnosis not present

## 2012-12-05 DIAGNOSIS — C50119 Malignant neoplasm of central portion of unspecified female breast: Secondary | ICD-10-CM | POA: Diagnosis not present

## 2012-12-05 DIAGNOSIS — L299 Pruritus, unspecified: Secondary | ICD-10-CM | POA: Diagnosis not present

## 2012-12-05 DIAGNOSIS — L819 Disorder of pigmentation, unspecified: Secondary | ICD-10-CM | POA: Diagnosis not present

## 2012-12-05 DIAGNOSIS — R5381 Other malaise: Secondary | ICD-10-CM | POA: Diagnosis not present

## 2012-12-05 NOTE — Progress Notes (Signed)
Patient at [facility] for final radiation treatment plan.  Patient accompanied by her husband.  Patient very happy and reports that she is doing well.  She reports that the redness and tenderness of the skin on her breast is improved.  The skin continues to be fragile so she is careful to not cause trauma with her fingernails when bathing or applying her cream.  Patient denies any questions or concerns at this time.  I encouraged her to call me for any needs she may have.

## 2012-12-07 NOTE — Progress Notes (Signed)
OFFICE PROGRESS NOTE  [name],[name], MD [address] [place] [address] Dr. Glenna [name]  Dr. Antony [name]  DIAGNOSIS: 67 year old female with new diagnosis of screen detected invasive mammary carcinoma with lobular features  STAGE:  Cancer of central portion of female breast  Primary site: Breast (Left)  Staging method: AJCC 7th Edition  Clinical: Stage IA (T1c, N0, cM0)  Summary: Stage IA (T1c, N0, cM0)  PRIOR THERAPY: #1Patient recently will underwent a screening mammogram and she was found to have a questionable mass just deep to the nipple in the superior. Periareolar region. Calcifications were present adjacent to but not within the mass. There was also noted to be focal abnormal density medially on the craniocaudal projection. Left breast ultrasound revealed a hypoechoic mass in the 12:00 position 1 cm from the nipple measuring 5 x 8 mm. There was also an ovoid cystic appearing lesion in the 11:00 position 2 cm from the nipple measuring 1.5 x 0.4 cm. Patient had a ultrasound guided core biopsy performed. The biopsy revealed invasive mammary carcinoma with mammary carcinoma in situ grade 2 with lobular features. Tumor was ER +100% PR +63% proliferation marker Ki-67 58% HER-2/neu showed amplification with a ratio of 2.48.  #2 Patient also had MRI of the breasts performed the MRI showed irregular enhancing mass within spiculated margins posterior and lateral to this mass wasn't immediately adjacent smaller satellite mass demonstrating similar features together the 2 masses measured 17 mm x 11 mm x 15 mm. There were no other abnormalities left axilla was negative  #3 patient is now status post lumpectomy of the left breast with sentinel lymph node biopsy the final pathology revealed a 1.6 cm grade 2 invasive lobular carcinoma, ER positive PR positive. HER-2/neu was not repeated and I have requested that the HER-2/neu status be repeated. The biopsy HER-2 was positive. However  the HER-2/neu result is now negative on the final specimen.  #3 s/p radiation therapy to the breast  #4 begin arimidex 1 mg daily for a total of 5 years  CURRENT THERAPY: arimidex 1 mg  INTERVAL HISTORY: [name] 67 y.o. female returns for followup visit  Overall she seems to be doing well without any significant problems. She denies any fevers chills night sweats headaches shortness of breath chest pains palpitations no myalgias and arthralgias. Her surgical site is healing well  MEDICAL HISTORY: Past Medical History  Diagnosis Date  . Postmenopausal bleeding   . Fibroids   . Headache(784.0)     tension  . Seasonal allergies   . Anemia     due to vaginal bleeding  . Urinary urgency   . Hypertension     under control with med., has been on med. x 3 yr.  . White coat hypertension   . History of endometriosis   . Breast cancer 08/2012    left  . Sleep apnea     uses CPAP nightly  . Arthritis     hands  . Dental crowns present   . Hyperlipidemia   . OSA (obstructive sleep apnea)     ALLERGIES:  is allergic to adhesive.  MEDICATIONS:  Current Outpatient Prescriptions  Medication Sig Dispense Refill  . aspirin 81 MG tablet Take 81 mg by mouth daily.      . calcium citrate-vitamin D (CITRACAL+D) 315-200 MG-UNIT per tablet Take 1 tablet by mouth 2 (two) times daily. 600 mg. Calcium, 400 mg. Vit. D3      . Cyanocobalamin (VITAMIN B-12 CR  PO) Take 500 mcg by mouth daily.       Marland Kitchen emollient (BIAFINE) cream Apply topically 2 (two) times daily.      . hyaluronate sodium (RADIAPLEXRX) GEL Apply topically 2 (two) times daily.      Marland Kitchen lisinopril-hydrochlorothiazide (PRINZIDE,ZESTORETIC) 10-12.5 MG per tablet Take 0.5 tablets by mouth daily.  90 tablet  1  . Loratadine-Pseudoephedrine (PX ALLERGY RELIEF D, LORATID, PO) Take by mouth as needed.      . non-metallic deodorant Thornton Papas) MISC Apply 1 application topically daily as needed.      . Omega-3 Fatty Acids (FISH OIL) 1000 MG  CAPS Take 1 capsule by mouth daily.      . vitamin C (ASCORBIC ACID) 500 MG tablet Take 1,000 mg by mouth daily.       . Zinc 50 MG TABS Take 1 tablet by mouth daily.      Marland Kitchen anastrozole (ARIMIDEX) 1 MG tablet Take 1 tablet (1 mg total) by mouth daily.  90 tablet  12  . fluticasone (FLONASE) 50 MCG/ACT nasal spray Place 4 sprays into the nose daily. As needed      . [DISCONTINUED] LISINOPRIL PO Take 1 tablet by mouth daily.       No current facility-administered medications for this visit.    SURGICAL HISTORY:  Past Surgical History  Procedure Laterality Date  . Hysteroscopy w/d&c  05/06/2009  . Exploratory laparotomy  06-30-2007    ATTEMPTED HYSTERECTOMY ABORTED DUE TO EXTENSIVE ENDOMETRIOSIS W/ DENSE PELVIC ADHESIONS INVOLVING UTERUS AND LOWER RECTOSIGMOID  . Cholecystectomy  1995  . Colonoscopy w/ polypectomy  03/20/2007  . Back surgery  1997  . Dilation and curettage of uterus  2005  . Dilation and curettage of uterus  06/22/2011    Procedure: DILATATION AND CURETTAGE;  Surgeon: Jeannette Corpus, MD;  Location: [facility];  Service: Gynecology;  Laterality: N/A;  OK PER KEELA FOR 7:15 START  . Breast lumpectomy with needle localization and axillary sentinel lymph node bx Left 09/03/2012    Procedure: BREAST LUMPECTOMY WITH NEEDLE LOCALIZATION AND AXILLARY SENTINEL LYMPH NODE BX;  Surgeon: Mariella Saa, MD;  Location: [facility];  Service: General;  Laterality: Left;    REVIEW OF SYSTEMS:  Pertinent items are noted in HPI.   HEALTH MAINTENANCE: PHYSICAL EXAMINATION: Blood pressure 134/79, pulse 73, temperature 97.8 F (36.6 C), temperature source Oral, resp. rate 20, height 5' 2.75" (1.594 m), weight 180 lb 12.8 oz (82.01 kg). Body mass index is 32.28 kg/(m^2). ECOG PERFORMANCE STATUS: 0 - Asymptomatic   General appearance: alert, cooperative and appears stated age Resp: clear to auscultation bilaterally Cardio: regular rate and rhythm GI:  soft, non-tender; bowel sounds normal; no masses,  no organomegaly Extremities: extremities normal, atraumatic, no cyanosis or edema Neurologic: Grossly normal   LABORATORY DATA: Lab Results  Component Value Date   WBC 6.1 09/15/2012   HGB 14.0 09/15/2012   HCT 40.6 09/15/2012   MCV 85.3 09/15/2012   PLT 286 09/15/2012      Chemistry      Component Value Date/Time   NA 139 09/15/2012 0838   NA 138 08/27/2012 1219   K 4.3 09/15/2012 0838   K 3.7 08/27/2012 1219   CL 104 09/15/2012 0838   CL 104 08/27/2012 1219   CO2 25 09/15/2012 0838   CO2 27 08/27/2012 1219   BUN 11 09/15/2012 0838   BUN 12.6 08/27/2012 1219   CREATININE 0.62 09/15/2012 0838   CREATININE  0.65 09/02/2012 1213   CREATININE 0.8 08/27/2012 1219      Component Value Date/Time   CALCIUM 9.5 09/15/2012 0838   CALCIUM 10.0 08/27/2012 1219   ALKPHOS 101 09/15/2012 0838   ALKPHOS 100 08/27/2012 1219   AST 16 09/15/2012 0838   AST 14 08/27/2012 1219   ALT 15 09/15/2012 0838   ALT 12 08/27/2012 1219   BILITOT 0.5 09/15/2012 0838   BILITOT 0.67 08/27/2012 1219    Diagnosis 1. Breast, lumpectomy, Left - INVASIVE LOBULAR CARCINOMA, SEE COMMENT. - INVASIVE TUMOR IS LESS THAN 0.1MM FROM NEAREST MARGIN (ANTERIOR). - LYMPHOVASCULAR INVASION IDENTIFIED. - LOBULAR CARCINOMA IN SITU. - IN SITU CARCINOMA IS PRESENT AT SUPERIOR AND INFERIOR MARGIN. - ATYPICAL DUCTAL HYPERPLASIA PRESENT - SEE TUMOR TEMPLATE BELOW. 2. Lymph node, sentinel, biopsy, Left axillary - ONE LYMPH NODE, NEGATIVE FOR TUMOR (0/1). - SEE COMMENT. Microscopic Comment 1. BREAST, INVASIVE TUMOR, WITH LYMPH NODE SAMPLING Specimen, including laterality: Left breast Procedure: Lumpectomy Grade: II of III Tubule formation: III Nuclear pleomorphism: II Mitotic:I Tumor size (gross measurement): 1.6 cm Margins: Invasive, distance to closest margin: Less than 0.80mm In-situ, distance to closest margin: Present at margin (see above) If margin positive, focally or broadly:  Broadly Lymphovascular invasion: Present Ductal carcinoma in situ:None Grade: N/A Extensive intraductal component: N/A Lobular neoplasia: Present Tumor focality: Unifocal Treatment effect: None If present, treatment effect in breast tissue, lymph nodes or both: N/A 1 of 3 FINAL for RYDER, CHESMORE ([id number]) Microscopic Comment(continued) Extent of tumor: Skin: N/A Nipple: N/A Skeletal muscle: N/A Lymph nodes: # examined: 1 Lymph nodes with metastasis: 0 Breast prognostic profile: Not repeated Estrogen receptor: Previous study demonstrated 100% positivity ([id number]) Progesterone receptor: Previous study demonstrated 63% positivity ([id number]) Her 2 neu: Not repeated, previous study demonstrated amplification (6.45) ([id number]) Ki-67: Not repeated, previous study demonstrated 58% proliferation rate ([id number]) Non-neoplastic breast: Previous biopsy site, fibrocystic change and microcalcifications TNM: pT1c, pN0, pMX Comments: None. 2. Cytokeratin AE1 / AE3 immunostain does not demonstrate any intranodal metastatic epithelial tumor deposits. (CRR:caf 09/04/12) [place] RUND DO Pathologist, Electronic Signature (Case signed 09/05/2012)    RADIOGRAPHIC STUDIES:  Mr Breast Bilateral W Wo Contrast  08/26/2012   *RADIOLOGY REPORT*  Clinical Data: recent diagnosis of in situ and invasive carcinoma left breast  BUN and creatinine were obtained on site at [facility] at [address] Results:  BUN 11 mg/dL,  Creatinine 0.9 mg/dL.  BILATERAL BREAST MRI WITH AND WITHOUT CONTRAST  Technique: Multiplanar, multisequence MR images of both breasts were obtained prior to and following the intravenous administration of 16ml of Multihance.  Three dimensional images were evaluated at the independent DynaCad workstation.  Comparison:  all recent images from [facility] performed June 2014  Findings: There is mild background parenchymal enhancement.  The right breast and  axilla are negative.  On the left, there is an anterior biopsy marker clip in the 12 o'clock position.  This is associated with a irregular enhancing mass with spiculated margins.  Posterior and lateral to this mass is an immediately adjacent smaller satellite mass demonstrating similar features.  Together, these masses measure 17mm (AP) x 11mm (lateral) x 15mm (craniocaudal).  There are no other parencymal abnormalities.  The left axilla is negative.  IMPRESSION: Dominant, biopsied mass with small immediately adjacent satellite mass consistent with biopsy results indicating malignancy, measuring 17 x 11 x 15mm.  BI-RADS CATEGORY 6:  Known biopsy-proven malignancy - appropriate action should be taken.  THREE-DIMENSIONAL MR IMAGE RENDERING ON  INDEPENDENT WORKSTATION:  Three-dimensional MR images were rendered by post-processing of the original MR data on an independent workstation.  The three- dimensional MR images were interpreted, and findings were reported in the accompanying complete MRI report for this study.   Original Report Authenticated By: Esperanza Heir, M.D.   Nm Sentinel Node Inj-no Rpt (breast)  09/03/2012   CLINICAL DATA: cancer of the left breast   Sulfur colloid was injected intradermally by the nuclear medicine  technologist for breast cancer sentinel node localization.     ASSESSMENT: 67 year old female with  #1 stage I( T1 C. N0) invasive lobular carcinoma of the left breast status post lumpectomy and sentinel lymph node biopsy. The final pathology reveals a ER/PR positive, repeat HER-2/neu pending with a Ki-67 of 58%. Patient's HER-2/neu on biopsy was positive. Question is whether to treat the patient with chemotherapy. However I have requested pathology to repeat the HER-2/neu on this particular patient to 2 with lobular features. If the HER-2/neu is negative then we will do an Oncotype DX testing to see if she indeed does need this chemotherapy versus going on to radiation and then  antiestrogen therapy.  #2 patient also has significant dysfunctional uterine bleeding I have recommended that we try Zoladex to see if we can just stop the bleeding with this. She is not able to go on hormone replacement therapy due to her diagnosis of breast cancer. Question was whether she could have a IUD placed. If Zoladex does not work then it would be absolutely necessary from oncology perspective to have a IUD placed to help patient with dysfunctional uterine bleeding.  #3 HER-2 nu testing on the final specimen was negative. I have therefore recommended patient proceed with radiation therapy. Once she completes radiation then she will go on to receive an aromatase inhibitor such as Arimidex 1 mg daily for a total of 5 years.   PLAN:  #1 begin arimidex 1 mg daily  #2 I will see her back in 3 months time for followup.  All questions were answered. The patient knows to call the clinic with any problems, questions or concerns. We can certainly see the patient much sooner if necessary.  I spent 25 minutes counseling the patient face to face. The total time spent in the appointment was 30 minutes.    Drue Second, MD Medical/Oncology [facility] [phone] (beeper) [phone] (Office)

## 2012-12-08 ENCOUNTER — Encounter: Payer: Self-pay | Admitting: *Deleted

## 2012-12-17 ENCOUNTER — Telehealth: Payer: Self-pay

## 2012-12-17 DIAGNOSIS — Z1211 Encounter for screening for malignant neoplasm of colon: Secondary | ICD-10-CM

## 2012-12-17 NOTE — Telephone Encounter (Signed)
Pended please advise.  

## 2012-12-17 NOTE — Telephone Encounter (Signed)
PT seen Dr Katrinka Blazing back in June and had decided to put off getting a colonoscopy for a while because she was undergoing radiation well now she is calling to get a colonoscopy scheduled Call back number is [phone]

## 2012-12-17 NOTE — Telephone Encounter (Signed)
Order signed for GI referral for colonoscopy; please advise patient.

## 2012-12-18 NOTE — Telephone Encounter (Signed)
Left message to return call 

## 2012-12-19 ENCOUNTER — Encounter: Payer: Self-pay | Admitting: Radiation Oncology

## 2012-12-19 NOTE — Progress Notes (Signed)
  Radiation Oncology         [phone] ________________________________  Name: [name] MRN: [id number]  Date: 12/19/2012  DOB: February 07, 1946  End of Treatment Note  Diagnosis:   Stage I invasive lobular carcinoma of the left breast     Indication for treatment:  Breast conservation therapy       Radiation treatment dates:   August 4 through September 19  Site/dose:   Left breast 50.4 gray in 28 fractions, the lumpectomy cavity was boosted to 62.4 gray  Beams/energy:   Tangential beams encompass in the left breast, forward planning, the lumpectomy cavity was boosted with a custom electron cutout field using 12 MeV electrons.  Narrative: The patient tolerated radiation treatment relatively well.   She had minimal fatigue as well as itching and discomfort within the breast area. No moist desquamation.  Plan: The patient has completed radiation treatment. The patient will return to radiation oncology clinic for routine followup in one month. I advised them to call or return sooner if they have any questions or concerns related to their recovery or treatment.  -----------------------------------  Billie Lade, PhD, MD

## 2012-12-30 DIAGNOSIS — H2589 Other age-related cataract: Secondary | ICD-10-CM | POA: Diagnosis not present

## 2012-12-30 DIAGNOSIS — H40019 Open angle with borderline findings, low risk, unspecified eye: Secondary | ICD-10-CM | POA: Diagnosis not present

## 2012-12-30 DIAGNOSIS — H43819 Vitreous degeneration, unspecified eye: Secondary | ICD-10-CM | POA: Diagnosis not present

## 2012-12-30 DIAGNOSIS — E119 Type 2 diabetes mellitus without complications: Secondary | ICD-10-CM | POA: Diagnosis not present

## 2012-12-30 DIAGNOSIS — H47329 Drusen of optic disc, unspecified eye: Secondary | ICD-10-CM | POA: Diagnosis not present

## 2012-12-31 ENCOUNTER — Ambulatory Visit: Payer: Medicare Other

## 2012-12-31 ENCOUNTER — Encounter: Payer: Self-pay | Admitting: Oncology

## 2013-01-01 ENCOUNTER — Encounter: Payer: Self-pay | Admitting: Radiation Oncology

## 2013-01-01 ENCOUNTER — Ambulatory Visit
Admission: RE | Admit: 2013-01-01 | Discharge: 2013-01-01 | Disposition: A | Discharge status: Home / Self Care | Payer: Medicare Other | Source: Ambulatory Visit | Attending: Radiation Oncology | Admitting: Radiation Oncology

## 2013-01-01 VITALS — BP 144/70 | HR 78 | Temp 97.7°F | Ht 61.5 in | Wt 184.6 lb

## 2013-01-01 DIAGNOSIS — C50112 Malignant neoplasm of central portion of left female breast: Secondary | ICD-10-CM

## 2013-01-01 NOTE — Progress Notes (Signed)
  Radiation Oncology         [phone] ________________________________  Name: [name] MRN: [id number]  Date: 01/01/2013  DOB: May 15, 1945  Follow-Up Visit Note  CC: [name], MD  [name], MD  Diagnosis:   Stage I invasive lobular carcinoma of the left breast  Interval Since Last Radiation:  1 months  Narrative:  The patient returns today for routine follow-up.  She is doing reasonably well at this time. She has some occasional discomfort in the central aspect of the breast. She denies any nipple discharge or bleeding. She denies any cough or breathing problems. She denies any swelling in her left arm or hand.    The patient has started Arimidex and is tolerating this medication well.                          ALLERGIES:  is allergic to adhesive.  Meds: Current Outpatient Prescriptions  Medication Sig Dispense Refill  . anastrozole (ARIMIDEX) 1 MG tablet Take 1 mg by mouth daily.       Marland Kitchen aspirin 81 MG tablet Take 81 mg by mouth daily.      . calcium citrate-vitamin D (CITRACAL+D) 315-200 MG-UNIT per tablet Take 1 tablet by mouth 2 (two) times daily. 600 mg. Calcium, 400 mg. Vit. D3      . emollient (BIAFINE) cream Apply topically 2 (two) times daily.      . hyaluronate sodium (RADIAPLEXRX) GEL Apply topically 2 (two) times daily.      Marland Kitchen lisinopril-hydrochlorothiazide (PRINZIDE,ZESTORETIC) 10-12.5 MG per tablet Take 0.5 tablets by mouth daily.  90 tablet  1  . Loratadine-Pseudoephedrine (PX ALLERGY RELIEF D, LORATID, PO) Take by mouth as needed.      . Omega-3 Fatty Acids (FISH OIL) 1000 MG CAPS Take 1 capsule by mouth daily.      . vitamin C (ASCORBIC ACID) 500 MG tablet Take 1,000 mg by mouth daily.       . Zinc 50 MG TABS Take 1 tablet by mouth daily.      . Cyanocobalamin (VITAMIN B-12 CR PO) Take 500 mcg by mouth daily.       . fluticasone (FLONASE) 50 MCG/ACT nasal spray Place 4 sprays into the nose daily. As needed      . non-metallic deodorant (ALRA)  MISC Apply 1 application topically daily as needed.      . [DISCONTINUED] LISINOPRIL PO Take 1 tablet by mouth daily.       No current facility-administered medications for this encounter.    Physical Findings: The patient is in no acute distress. Patient is alert and oriented.  height is 5' 1.5" (1.562 m) and weight is 184 lb 9.6 oz (83.734 kg). Her temperature is 97.7 F (36.5 C). Her blood pressure is 144/70 and her pulse is 78. Marland Kitchen No palpable supraclavicular or axillary adenopathy. The lungs clear to auscultation. The heart has a regular rhythm and rate. Examination of the left breast area reveals the skin to be well-healed. There is mild hyperpigmentation changes noted. There is some edema noted in the central breast area. No nipple discharge or bleeding.     Radiographic Findings: No results found.  Impression:  The patient is recovering from the effects of radiation.  No evidence of recurrence on clinical exam today.  Plan:  Routine followup in 3 months.  _____________________________________  -----------------------------------  Billie Lade, PhD, MD

## 2013-01-01 NOTE — Progress Notes (Addendum)
[name] here with her husband for follow up after treatment to her left breast.  She has occasional aching pain in her left breast that she rates at a 4/10.  She denies fatigue.  The skin on her left breast is intact and pink.  She uses radiaplex gel occasionally.  She started taking Arimidex on 12/17/12.

## 2013-01-22 ENCOUNTER — Other Ambulatory Visit: Payer: Self-pay

## 2013-01-30 ENCOUNTER — Ambulatory Visit: Payer: Medicare Other

## 2013-02-16 ENCOUNTER — Encounter: Payer: Self-pay | Admitting: Family Medicine

## 2013-02-20 ENCOUNTER — Telehealth: Payer: Self-pay | Admitting: *Deleted

## 2013-02-20 NOTE — Telephone Encounter (Signed)
I called patient to check in and to review her upcoming appointments.  Patient reports that she is doing very well.  She denies any complaints or concerns.  She continues to take Arimidex daily and is tolerating it without any side effects or problems.  Patient denies any questions or concerns at this time.  I encouraged her to call me for any needs she may have.

## 2013-02-23 DIAGNOSIS — Z23 Encounter for immunization: Secondary | ICD-10-CM | POA: Diagnosis not present

## 2013-03-02 ENCOUNTER — Ambulatory Visit: Payer: Medicare Other

## 2013-03-03 ENCOUNTER — Encounter (INDEPENDENT_AMBULATORY_CARE_PROVIDER_SITE_OTHER): Payer: Self-pay | Admitting: General Surgery

## 2013-03-06 ENCOUNTER — Ambulatory Visit: Payer: Medicare Other | Attending: Gynecology | Admitting: Gynecology

## 2013-03-06 ENCOUNTER — Encounter: Payer: Self-pay | Admitting: Gynecology

## 2013-03-06 VITALS — BP 156/76 | HR 84 | Temp 98.0°F | Resp 16 | Ht 61.5 in | Wt 191.4 lb

## 2013-03-06 DIAGNOSIS — J301 Allergic rhinitis due to pollen: Secondary | ICD-10-CM | POA: Insufficient documentation

## 2013-03-06 DIAGNOSIS — C50919 Malignant neoplasm of unspecified site of unspecified female breast: Secondary | ICD-10-CM | POA: Diagnosis not present

## 2013-03-06 DIAGNOSIS — Z79899 Other long term (current) drug therapy: Secondary | ICD-10-CM | POA: Diagnosis not present

## 2013-03-06 DIAGNOSIS — I10 Essential (primary) hypertension: Secondary | ICD-10-CM | POA: Insufficient documentation

## 2013-03-06 DIAGNOSIS — N950 Postmenopausal bleeding: Secondary | ICD-10-CM

## 2013-03-06 DIAGNOSIS — E785 Hyperlipidemia, unspecified: Secondary | ICD-10-CM | POA: Insufficient documentation

## 2013-03-06 DIAGNOSIS — Z8742 Personal history of other diseases of the female genital tract: Secondary | ICD-10-CM | POA: Diagnosis not present

## 2013-03-06 DIAGNOSIS — N898 Other specified noninflammatory disorders of vagina: Secondary | ICD-10-CM | POA: Diagnosis not present

## 2013-03-06 DIAGNOSIS — R351 Nocturia: Secondary | ICD-10-CM | POA: Insufficient documentation

## 2013-03-06 DIAGNOSIS — G4733 Obstructive sleep apnea (adult) (pediatric): Secondary | ICD-10-CM | POA: Insufficient documentation

## 2013-03-06 DIAGNOSIS — Z923 Personal history of irradiation: Secondary | ICD-10-CM | POA: Insufficient documentation

## 2013-03-06 NOTE — Progress Notes (Signed)
Consult Note: Gyn-Onc   [name] 67 y.o. female  Chief Complaint  Patient presents with  . Abnormal Bleeding    Follow up    Assessment:  Abnormal uterine bleeding now been well controlled now taking arimidex 1 mg daily for 5 years.  Plan;  The patient continue taking arimadex and prescribed by Dr. Park Breed.  If she has any further bleeding, we should repeat endometrial sampling.  One could consider a Mirena IUD should bleeding recur.. she will contact us should she have any bleeding. For now we will not schedule her return appointment.   Interval History: The patient was last seen in November of 2013. She returns today as previously scheduled for followup. She's been taking Provera 10 mg daily since her last visit. She's had no further bleeding. She's tolerating the Provera well. She denies any other GI or GU symptoms. She has no pelvic pain or pressure. Unfortunately, she's recently been diagnosed with a cancer the left breast. She is taking arimadex and the provera was stopped.  She has not had any bleeding since our last visit 6 months ago.    HPI:The patient has a long-standing history of abnormal bleeding. She has undergone several D&Cs and endometrial biopsies some of which have shown simple hyperplasia without atypia. For a time she was treated with Provera but on an inconsistent regimen. She underwent exploratory laparotomy in April 2009 for a planned hysterectomy to manage her bleeding. However extensive adhesions were encountered and the procedure was abandoned. In February 2011 she underwent hysteroscopy and D&C which revealed superficial fragments of weakly proliferative endometrium. No atypia hyperplasia or malignancy was identified. It is interesting to note that this was proliferative endometrium given the fact that the patient was supposed to have been taking Provera. Since 2011 she has not received any additional hormonal management. She underwent a D&C on 06/22/2011.  Final pathology showed disordered proliferative endometrium with no evidence of hyperplasia. The patient was placed on Provera 10 mg daily. However when she developed breast cancer in 2014 Provera was stopped. The patient was placed on Arimadex.   Review of Systems:10 point review of systems is negative as noted above.   Vitals: Blood pressure 156/76, pulse 84, temperature 98 F (36.7 C), resp. rate 16, height 5' 1.5" (1.562 m), weight 191 lb 6.4 oz (86.818 kg).  Physical Exam: General : The patient is a healthy woman in no acute distress.  HEENT: normocephalic, extraoccular movements normal; neck is supple without thyromegally  Lynphnodes: Supraclavicular and inguinal nodes not enlarged  Abdomen: Soft, non-tender, no ascites, no organomegally, no masses, no hernias  Pelvic:  EGBUS: Normal female  Vagina: Normal, no lesions  Urethra and Bladder: Normal, non-tender  Cervix: normal  Uterus: Anterior normal shape and size Bi-manual examination: Non-tender; no adenxal masses or nodularity  Rectal: normal sphincter tone, no masses, no blood  Lower extremities: No edema or varicosities. Normal range of motion     Allergies  Allergen Reactions  . Adhesive [Tape] Rash    Past Medical History  Diagnosis Date  . Postmenopausal bleeding   . Fibroids   . Headache(784.0)     tension  . Seasonal allergies   . Anemia     due to vaginal bleeding  . Urinary urgency   . Hypertension     under control with med., has been on med. x 3 yr.  . White coat hypertension   . History of endometriosis   . Breast cancer 08/2012  left  . Sleep apnea     uses CPAP nightly  . Arthritis     hands  . Dental crowns present   . Hyperlipidemia   . OSA (obstructive sleep apnea)   . S/P radiation therapy 10/20/2012-12/05/2012    left breast 50.4 gray, lumpectomy cavity boosted to 62.4 gray    Past Surgical History  Procedure Laterality Date  . Hysteroscopy w/d&c  05/06/2009  . Exploratory laparotomy   06-30-2007    ATTEMPTED HYSTERECTOMY ABORTED DUE TO EXTENSIVE ENDOMETRIOSIS W/ DENSE PELVIC ADHESIONS INVOLVING UTERUS AND LOWER RECTOSIGMOID  . Cholecystectomy  1995  . Colonoscopy w/ polypectomy  03/20/2007  . Back surgery  1997  . Dilation and curettage of uterus  2005  . Dilation and curettage of uterus  06/22/2011    Procedure: DILATATION AND CURETTAGE;  Surgeon: Jeannette Corpus, MD;  Location: [facility];  Service: Gynecology;  Laterality: N/A;  OK PER KEELA FOR 7:15 START  . Breast lumpectomy with needle localization and axillary sentinel lymph node bx Left 09/03/2012    Procedure: BREAST LUMPECTOMY WITH NEEDLE LOCALIZATION AND AXILLARY SENTINEL LYMPH NODE BX;  Surgeon: Mariella Saa, MD;  Location: [facility];  Service: General;  Laterality: Left;    Current Outpatient Prescriptions  Medication Sig Dispense Refill  . anastrozole (ARIMIDEX) 1 MG tablet Take 1 mg by mouth daily.       Marland Kitchen aspirin 81 MG tablet Take 81 mg by mouth daily.      . calcium citrate-vitamin D (CITRACAL+D) 315-200 MG-UNIT per tablet Take 1 tablet by mouth 2 (two) times daily. 600 mg. Calcium, 400 mg. Vit. D3      . Cyanocobalamin (VITAMIN B-12 CR PO) Take 500 mcg by mouth daily.       Marland Kitchen lisinopril-hydrochlorothiazide (PRINZIDE,ZESTORETIC) 10-12.5 MG per tablet Take 0.5 tablets by mouth daily.  90 tablet  1  . Omega-3 Fatty Acids (FISH OIL) 1000 MG CAPS Take 1 capsule by mouth daily.      . vitamin C (ASCORBIC ACID) 500 MG tablet Take 1,000 mg by mouth daily.       . Zinc 50 MG TABS Take 1 tablet by mouth daily.      . fluticasone (FLONASE) 50 MCG/ACT nasal spray Place 4 sprays into the nose daily. As needed      . Loratadine-Pseudoephedrine (PX ALLERGY RELIEF D, LORATID, PO) Take by mouth as needed.      . [DISCONTINUED] LISINOPRIL PO Take 1 tablet by mouth daily.       No current facility-administered medications for this visit.    History   Social History  .  Marital Status: Married    Spouse Name: [name]    Number of Children: 4  . Years of Education: College   Occupational History  . retired    Social History Main Topics  . Smoking status: Never Smoker   . Smokeless tobacco: Never Used  . Alcohol Use: No  . Drug Use: No  . Sexual Activity: Not on file     Comment: menarche 13, P4, Premarin for short while, Provera x 3 years   Other Topics Concern  . Not on file   Social History Narrative   Marital status: married x 47 years; happily married; no abuse.      Children: 4 children; six grandchildren.      Lives:  With husband.      Employed: retired in 2011; [employer].  Tobacco: none       Alcohol:  None      Drugs: none      Exercise:  Daily. Walking x 1 hour three days per week.      Seatbelt:  100%      Sunscreen:  Face SPF 15.      Guns:  Loaded mostly secured.      ADLs:  Drives; no assistant devices; independent with all ADLs.       Her nocturia is improved under CPAP use at 10 cm water- AHI is now 0.8 and from 30 at baseline. CMS compliance  6 hours 40 minutes - sleeps on the side, 08-10-11 .FFM - likes it.    . Flonase used prn.     Caffeine Use: 1 cup daily    Family History  Problem Relation Age of Onset  . Hypertension Other   . Vision loss Mother   . Hypertension Mother   . Anesthesia problems Mother     post-op N/V  . Heart disease Mother   . Osteoporosis Mother   . Depression Father   . Heart disease Sister   . Hypertension Sister   . Diabetes Brother   . Hypertension Brother   . Cancer Brother     skin  . Hyperlipidemia Brother   . COPD Daughter   . Cancer Cousin   . Kidney failure Maternal Grandmother   . Heart disease Maternal Grandfather       Jeannette Corpus, MD 03/06/2013, 8:26 AM

## 2013-03-06 NOTE — Patient Instructions (Signed)
Please contact us should he have any other bleeding.

## 2013-03-23 ENCOUNTER — Ambulatory Visit (INDEPENDENT_AMBULATORY_CARE_PROVIDER_SITE_OTHER): Payer: Medicare Other | Admitting: Family Medicine

## 2013-03-23 ENCOUNTER — Ambulatory Visit (HOSPITAL_COMMUNITY)
Admission: RE | Admit: 2013-03-23 | Discharge: 2013-03-23 | Disposition: A | Discharge status: Home / Self Care | Payer: Medicare Other | Source: Ambulatory Visit | Attending: Family Medicine | Admitting: Family Medicine

## 2013-03-23 ENCOUNTER — Encounter: Payer: Self-pay | Admitting: Family Medicine

## 2013-03-23 VITALS — BP 150/72 | HR 74 | Temp 97.8°F | Resp 16 | Ht 61.0 in | Wt 191.6 lb

## 2013-03-23 DIAGNOSIS — M79609 Pain in unspecified limb: Secondary | ICD-10-CM

## 2013-03-23 DIAGNOSIS — J3089 Other allergic rhinitis: Secondary | ICD-10-CM | POA: Insufficient documentation

## 2013-03-23 DIAGNOSIS — G4733 Obstructive sleep apnea (adult) (pediatric): Secondary | ICD-10-CM

## 2013-03-23 DIAGNOSIS — M79604 Pain in right leg: Secondary | ICD-10-CM

## 2013-03-23 DIAGNOSIS — I10 Essential (primary) hypertension: Secondary | ICD-10-CM | POA: Diagnosis not present

## 2013-03-23 DIAGNOSIS — M7989 Other specified soft tissue disorders: Secondary | ICD-10-CM | POA: Insufficient documentation

## 2013-03-23 DIAGNOSIS — R7309 Other abnormal glucose: Secondary | ICD-10-CM | POA: Diagnosis not present

## 2013-03-23 DIAGNOSIS — J309 Allergic rhinitis, unspecified: Secondary | ICD-10-CM

## 2013-03-23 DIAGNOSIS — C50112 Malignant neoplasm of central portion of left female breast: Secondary | ICD-10-CM

## 2013-03-23 DIAGNOSIS — N939 Abnormal uterine and vaginal bleeding, unspecified: Secondary | ICD-10-CM

## 2013-03-23 DIAGNOSIS — E7800 Pure hypercholesterolemia, unspecified: Secondary | ICD-10-CM | POA: Diagnosis not present

## 2013-03-23 DIAGNOSIS — Z9989 Dependence on other enabling machines and devices: Secondary | ICD-10-CM

## 2013-03-23 DIAGNOSIS — E119 Type 2 diabetes mellitus without complications: Secondary | ICD-10-CM

## 2013-03-23 HISTORY — DX: Allergic rhinitis, unspecified: J30.9

## 2013-03-23 LAB — CBC WITH DIFFERENTIAL/PLATELET
Basophils Absolute: 0 10*3/uL (ref 0.0–0.1)
Basophils Relative: 1 % (ref 0–1)
EOS ABS: 0.3 10*3/uL (ref 0.0–0.7)
EOS PCT: 6 % — AB (ref 0–5)
HCT: 38.8 % (ref 36.0–46.0)
Hemoglobin: 13.6 g/dL (ref 12.0–15.0)
LYMPHS ABS: 0.8 10*3/uL (ref 0.7–4.0)
Lymphocytes Relative: 17 % (ref 12–46)
MCH: 29.1 pg (ref 26.0–34.0)
MCHC: 35.1 g/dL (ref 30.0–36.0)
MCV: 83.1 fL (ref 78.0–100.0)
MONOS PCT: 10 % (ref 3–12)
Monocytes Absolute: 0.5 10*3/uL (ref 0.1–1.0)
Neutro Abs: 3.2 10*3/uL (ref 1.7–7.7)
Neutrophils Relative %: 66 % (ref 43–77)
PLATELETS: 234 10*3/uL (ref 150–400)
RBC: 4.67 MIL/uL (ref 3.87–5.11)
RDW: 13.2 % (ref 11.5–15.5)
WBC: 4.8 10*3/uL (ref 4.0–10.5)

## 2013-03-23 LAB — HEMOGLOBIN A1C
Hgb A1c MFr Bld: 6.1 % — ABNORMAL HIGH (ref <5.7)
Mean Plasma Glucose: 128 mg/dL — ABNORMAL HIGH (ref <117)

## 2013-03-23 LAB — COMPLETE METABOLIC PANEL WITH GFR
ALT: 13 U/L (ref 0–35)
AST: 17 U/L (ref 0–37)
Albumin: 4.1 g/dL (ref 3.5–5.2)
Alkaline Phosphatase: 98 U/L (ref 39–117)
BILIRUBIN TOTAL: 0.8 mg/dL (ref 0.3–1.2)
BUN: 17 mg/dL (ref 6–23)
CO2: 28 meq/L (ref 19–32)
CREATININE: 0.64 mg/dL (ref 0.50–1.10)
Calcium: 10.2 mg/dL (ref 8.4–10.5)
Chloride: 100 mEq/L (ref 96–112)
GLUCOSE: 113 mg/dL — AB (ref 70–99)
Potassium: 4.4 mEq/L (ref 3.5–5.3)
Sodium: 139 mEq/L (ref 135–145)
Total Protein: 6.9 g/dL (ref 6.0–8.3)

## 2013-03-23 LAB — LIPID PANEL
Cholesterol: 212 mg/dL — ABNORMAL HIGH (ref 0–200)
HDL: 48 mg/dL (ref >39)
LDL Cholesterol: 146 mg/dL — ABNORMAL HIGH (ref 0–99)
TRIGLYCERIDES: 92 mg/dL (ref <150)
Total CHOL/HDL Ratio: 4.4 Ratio
VLDL: 18 mg/dL (ref 0–40)

## 2013-03-23 MED ORDER — LISINOPRIL-HYDROCHLOROTHIAZIDE 10-12.5 MG PO TABS: 0.5000 | ORAL_TABLET | Freq: Every day | ORAL | Status: DC

## 2013-03-23 MED ORDER — FLUTICASONE PROPIONATE 50 MCG/ACT NA SUSP: 2.0000 | Freq: Every day | NASAL | Status: DC

## 2013-03-23 MED ORDER — LOVASTATIN 20 MG PO TABS: 20.0000 mg | ORAL_TABLET | Freq: Every day | ORAL | Status: DC

## 2013-03-23 NOTE — Progress Notes (Signed)
Subjective:    Patient ID: [name], female    DOB: [birth year], 68 y.o.   MRN: [id number]  HPI This 68 y.o. female presents for six month follow-up:  1.  Breast cancer L: s/p radiation therapy.  Started on Arimidex.  Stage I invasive lobular carcinoma of L breast.    2.  Hypercholesterolemia: did not start Lovastatin after last visit.  Requesting rx if needed at this time.   3. OSA: s/p follow-up with Dr. Brett Fairy in 10/2012.  No changes made to management.   4.  HTN: six month follow-up; no changes to management made at last visit; reports good compliance to treatment; good tolerance to treatment; good symptom control. Does suffer with sharp stabbing chest pain L sided that radiates to back when "holding in stomach".  Pain is sharp and stabbing and lasts a few seconds.  Denies palpitations, SOB.  +R foot swelling intermittently for the past two weeks.    5.  Glucose intolerance: has gained 15 pounds in six months; has been eating more due to recent diagnosis of breast cancer.  Sugars have been running higher; running 100-130.  Not exercising currently.  6.  R foot swelling: onset in past two weeks; very busy during the holidays; no trauma to foot; no foot pain.  No swelling that extends into R calf.  No calf pain.  S/p treatment in past six months for breast cancer.    7. Vaginal bleeding: no recurrent vaginal bleeding; has been cleared by [facility].   8.  R ear pain: onset in past week; +nasal congestion chronic.  No fever/chills/sweats.  No sore throat.  Mild cough.  Does not like to take Claritin daily due to "medicine head".  Not using Flonase.   Review of Systems  Constitutional: Positive for appetite change and unexpected weight change. Negative for fever, chills, diaphoresis and fatigue.  HENT: Positive for congestion, ear pain, postnasal drip and rhinorrhea. Negative for ear discharge, sinus pressure, sore throat, trouble swallowing and voice change.     Respiratory: Negative for cough and shortness of breath.   Cardiovascular: Positive for leg swelling. Negative for chest pain and palpitations.  Gastrointestinal: Negative for abdominal pain.  Musculoskeletal: Positive for joint swelling. Negative for gait problem.  Skin: Negative for rash.  Neurological: Positive for dizziness. Negative for tremors, seizures, syncope, speech difficulty, weakness, light-headedness, numbness and headaches.  Psychiatric/Behavioral: Negative for dysphoric mood. The patient is not nervous/anxious.    Past Medical History  Diagnosis Date  . Postmenopausal bleeding   . Fibroids   . Headache(784.0)     tension  . Seasonal allergies   . Anemia     due to vaginal bleeding  . Urinary urgency   . Hypertension     under control with med., has been on med. x 3 yr.  . White coat hypertension   . History of endometriosis   . Breast cancer 08/2012    left  . Sleep apnea     uses CPAP nightly  . Arthritis     hands  . Dental crowns present   . Hyperlipidemia   . OSA (obstructive sleep apnea)   . S/P radiation therapy 10/20/2012-12/05/2012    left breast 50.4 gray, lumpectomy cavity boosted to 62.4 gray   Past Surgical History  Procedure Laterality Date  . Hysteroscopy w/d&c  05/06/2009  . Exploratory laparotomy  06-30-2007    ATTEMPTED HYSTERECTOMY ABORTED DUE TO EXTENSIVE ENDOMETRIOSIS W/ DENSE PELVIC ADHESIONS INVOLVING UTERUS AND  LOWER RECTOSIGMOID  . Cholecystectomy  1995  . Colonoscopy w/ polypectomy  03/20/2007  . Back surgery  1997  . Dilation and curettage of uterus  2005  . Dilation and curettage of uterus  06/22/2011    Procedure: DILATATION AND CURETTAGE;  Surgeon: Alvino Chapel, MD;  Location: [facility];  Service: Gynecology;  Laterality: N/A;  OK PER KEELA FOR 7:15 START  . Breast lumpectomy with needle localization and axillary sentinel lymph node bx Left 09/03/2012    Procedure: BREAST LUMPECTOMY WITH NEEDLE LOCALIZATION  AND AXILLARY SENTINEL LYMPH NODE BX;  Surgeon: Edward Jolly, MD;  Location: [facility];  Service: General;  Laterality: Left;   Allergies  Allergen Reactions  . Adhesive [Tape] Rash   Current Outpatient Prescriptions on File Prior to Visit  Medication Sig Dispense Refill  . anastrozole (ARIMIDEX) 1 MG tablet Take 1 mg by mouth daily.       Marland Kitchen aspirin 81 MG tablet Take 81 mg by mouth daily.      . calcium citrate-vitamin D (CITRACAL+D) 315-200 MG-UNIT per tablet Take 1 tablet by mouth 2 (two) times daily. 600 mg. Calcium, 400 mg. Vit. D3      . Cyanocobalamin (VITAMIN B-12 CR PO) Take 500 mcg by mouth daily.       . fluticasone (FLONASE) 50 MCG/ACT nasal spray Place 4 sprays into the nose daily. As needed      . lisinopril-hydrochlorothiazide (PRINZIDE,ZESTORETIC) 10-12.5 MG per tablet Take 0.5 tablets by mouth daily.  90 tablet  1  . Loratadine-Pseudoephedrine (PX ALLERGY RELIEF D, LORATID, PO) Take by mouth as needed.      . Omega-3 Fatty Acids (FISH OIL) 1000 MG CAPS Take 1 capsule by mouth daily.      . vitamin C (ASCORBIC ACID) 500 MG tablet Take 1,000 mg by mouth daily.       . Zinc 50 MG TABS Take 1 tablet by mouth daily.      . [DISCONTINUED] LISINOPRIL PO Take 1 tablet by mouth daily.       No current facility-administered medications on file prior to visit.       Objective:   Physical Exam  Nursing note and vitals reviewed. Constitutional: She appears well-developed and well-nourished. No distress.  HENT:  Head: Normocephalic and atraumatic.  Nose: Nose normal.  Mouth/Throat: Oropharynx is clear and moist.  B cerumen impaction; TM not visualized.  Eyes: Conjunctivae and EOM are normal. Pupils are equal, round, and reactive to light.  Neck: Normal range of motion. Neck supple. No JVD present. Carotid bruit is not present. No thyromegaly present.  Cardiovascular: Normal rate, regular rhythm, normal heart sounds and intact distal pulses.  Exam reveals no  gallop and no friction rub.   No murmur heard. Mild R foot swelling.  Hommen's negative. No calf TTP.  +prominent vascularity superficial non-tender without erythema or palpable cords.  Pulmonary/Chest: Effort normal and breath sounds normal. She has no wheezes. She has no rales.  Abdominal: Soft. Bowel sounds are normal. She exhibits no distension. There is no tenderness. There is no rebound and no guarding.  Musculoskeletal:       Right ankle: Normal.       Right foot: Normal.  Lymphadenopathy:    She has no cervical adenopathy.  Skin: She is not diaphoretic.  Psychiatric: She has a normal mood and affect. Her behavior is normal.    B EARS IRRIGATED IN OFFICE.    Assessment & Plan:  Type II or unspecified type diabetes mellitus without mention of complication, not stated as uncontrolled - Plan: HM Diabetes Foot Exam  Essential hypertension, benign - Plan: CBC with Differential, COMPLETE METABOLIC PANEL WITH GFR  Pure hypercholesterolemia - Plan: CBC with Differential, COMPLETE METABOLIC PANEL WITH GFR, Lipid panel  Other abnormal glucose - Plan: COMPLETE METABOLIC PANEL WITH GFR, Hemoglobin A1c  Lower extremity pain, right - Plan: Lower Extremity Venous Duplex Right  Leg swelling  Obstructive sleep apnea on CPAP  Uterine bleeding  Allergic rhinitis, cause unspecified  Cancer of central portion of female breast, left  1. Glucose intolerance/DMII: controlled; recent weight gain; obtain labs.  Recommend weight loss, exercise, low-carb food choices. 2.  HTN: controlled moderately; obtain labs; continue current medications. 3.  Hypercholesterolemia:  Uncontrolled due to non-compliance with medication; will provide rx for Lovastatin. 4.  RLE swelling: New.  With recent breast cancer; refer for LE doppler to rule out DVT. 5.  OSA: Controlled; managed by [facility]; followed annually in August. 6.  Allergic Rhinitis: uncontrolled; intolerant to oral antihistamines; start Flonase  daily. 7.   B Cerumen Impaction:  New.  S/p irrigation of B ears in office. 8.  Uterine bleeding: resolved; cleared by gyn/onc.  Meds ordered this encounter  Medications  . fluticasone (FLONASE) 50 MCG/ACT nasal spray    Sig: Place 2 sprays into both nostrils daily. As needed    Dispense:  16 g    Refill:  11  . lisinopril-hydrochlorothiazide (PRINZIDE,ZESTORETIC) 10-12.5 MG per tablet    Sig: Take 0.5 tablets by mouth daily.    Dispense:  90 tablet    Refill:  1  . lovastatin (MEVACOR) 20 MG tablet    Sig: Take 1 tablet (20 mg total) by mouth at bedtime.    Dispense:  90 tablet    Refill:  3    Call report at 11:02am of RLE doppler negative; patient to be advised by vascular tech of negative results.

## 2013-03-23 NOTE — Progress Notes (Signed)
VASCULAR LAB PRELIMINARY  PRELIMINARY  PRELIMINARY  PRELIMINARY  Right lower extremity venous duplex completed.    Preliminary report:  Right:  No evidence of DVT, superficial thrombosis, or Baker's cyst.  Kazia Grisanti, RVS 03/23/2013, 11:05 AM

## 2013-03-25 ENCOUNTER — Other Ambulatory Visit: Payer: Self-pay | Admitting: *Deleted

## 2013-03-25 DIAGNOSIS — C50119 Malignant neoplasm of central portion of unspecified female breast: Secondary | ICD-10-CM

## 2013-03-26 ENCOUNTER — Ambulatory Visit (HOSPITAL_BASED_OUTPATIENT_CLINIC_OR_DEPARTMENT_OTHER): Payer: Medicare Other | Admitting: Oncology

## 2013-03-26 ENCOUNTER — Encounter: Payer: Self-pay | Admitting: Radiation Oncology

## 2013-03-26 ENCOUNTER — Other Ambulatory Visit (HOSPITAL_BASED_OUTPATIENT_CLINIC_OR_DEPARTMENT_OTHER): Payer: Medicare Other

## 2013-03-26 ENCOUNTER — Encounter: Payer: Self-pay | Admitting: *Deleted

## 2013-03-26 ENCOUNTER — Telehealth: Payer: Self-pay | Admitting: *Deleted

## 2013-03-26 ENCOUNTER — Encounter: Payer: Self-pay | Admitting: Oncology

## 2013-03-26 ENCOUNTER — Ambulatory Visit
Admission: RE | Admit: 2013-03-26 | Discharge: 2013-03-26 | Disposition: A | Discharge status: Home / Self Care | Payer: Medicare Other | Source: Ambulatory Visit | Attending: Radiation Oncology | Admitting: Radiation Oncology

## 2013-03-26 VITALS — BP 142/81 | HR 78 | Temp 97.6°F | Resp 20 | Wt 191.7 lb

## 2013-03-26 VITALS — BP 150/67 | HR 87 | Temp 98.2°F | Resp 18 | Ht 61.0 in | Wt 192.8 lb

## 2013-03-26 DIAGNOSIS — C50119 Malignant neoplasm of central portion of unspecified female breast: Secondary | ICD-10-CM

## 2013-03-26 DIAGNOSIS — Z170 Estrogen receptor positive status [ER+]: Secondary | ICD-10-CM

## 2013-03-26 DIAGNOSIS — N939 Abnormal uterine and vaginal bleeding, unspecified: Secondary | ICD-10-CM

## 2013-03-26 DIAGNOSIS — C50112 Malignant neoplasm of central portion of left female breast: Secondary | ICD-10-CM

## 2013-03-26 DIAGNOSIS — N926 Irregular menstruation, unspecified: Secondary | ICD-10-CM

## 2013-03-26 LAB — CBC WITH DIFFERENTIAL/PLATELET
BASO%: 0.7 % (ref 0.0–2.0)
BASOS ABS: 0 10*3/uL (ref 0.0–0.1)
EOS ABS: 0.4 10*3/uL (ref 0.0–0.5)
EOS%: 6.2 % (ref 0.0–7.0)
HCT: 40.6 % (ref 34.8–46.6)
HEMOGLOBIN: 13.8 g/dL (ref 11.6–15.9)
LYMPH%: 19.9 % (ref 14.0–49.7)
MCH: 29.8 pg (ref 25.1–34.0)
MCHC: 33.9 g/dL (ref 31.5–36.0)
MCV: 87.8 fL (ref 79.5–101.0)
MONO#: 0.3 10*3/uL (ref 0.1–0.9)
MONO%: 6 % (ref 0.0–14.0)
NEUT%: 67.2 % (ref 38.4–76.8)
NEUTROS ABS: 3.9 10*3/uL (ref 1.5–6.5)
PLATELETS: 239 10*3/uL (ref 145–400)
RBC: 4.62 10*6/uL (ref 3.70–5.45)
RDW: 12.7 % (ref 11.2–14.5)
WBC: 5.8 10*3/uL (ref 3.9–10.3)
lymph#: 1.2 10*3/uL (ref 0.9–3.3)

## 2013-03-26 LAB — COMPREHENSIVE METABOLIC PANEL (CC13)
ALK PHOS: 112 U/L (ref 40–150)
ALT: 13 U/L (ref 0–55)
AST: 15 U/L (ref 5–34)
Albumin: 3.7 g/dL (ref 3.5–5.0)
Anion Gap: 9 mEq/L (ref 3–11)
BUN: 15 mg/dL (ref 7.0–26.0)
CO2: 27 meq/L (ref 22–29)
Calcium: 10.1 mg/dL (ref 8.4–10.4)
Chloride: 105 mEq/L (ref 98–109)
Creatinine: 0.8 mg/dL (ref 0.6–1.1)
GLUCOSE: 173 mg/dL — AB (ref 70–140)
POTASSIUM: 4.1 meq/L (ref 3.5–5.1)
Sodium: 141 mEq/L (ref 136–145)
Total Bilirubin: 0.57 mg/dL (ref 0.20–1.20)
Total Protein: 7.2 g/dL (ref 6.4–8.3)

## 2013-03-26 NOTE — Progress Notes (Addendum)
Pt states she has mild achy pain in her upper arms. She attributes this pain to cooking and decorating for holidays. She denies other pain, loss of appetite, fatigue. Taking Arimidex daily.

## 2013-03-26 NOTE — Progress Notes (Signed)
OFFICE PROGRESS NOTE  [name],[name], MD [place] [address] Dr. Excell Seltzer  Dr. Gery Pray  DIAGNOSIS: 68 year old female with new diagnosis of screen detected invasive mammary carcinoma with lobular features  STAGE:  Cancer of central portion of female breast  Primary site: Breast (Left)  Staging method: AJCC 7th Edition  Clinical: Stage IA (T1c, N0, cM0)  Summary: Stage IA (T1c, N0, cM0)  PRIOR THERAPY: #1Patient recently will underwent a screening mammogram and she was found to have a questionable mass just deep to the nipple in the superior. Periareolar region. Calcifications were present adjacent to but not within the mass. There was also noted to be focal abnormal density medially on the craniocaudal projection. Left breast ultrasound revealed a hypoechoic mass in the 12:00 position 1 cm from the nipple measuring 5 x 8 mm. There was also an ovoid cystic appearing lesion in the 11:00 position 2 cm from the nipple measuring 1.5 x 0.4 cm. Patient had a ultrasound guided core biopsy performed. The biopsy revealed invasive mammary carcinoma with mammary carcinoma in situ grade 2 with lobular features. Tumor was ER +100% PR +63% proliferation marker Ki-67 58% HER-2/neu showed amplification with a ratio of 2.48.  #2 Patient also had MRI of the breasts performed the MRI showed irregular enhancing mass within spiculated margins posterior and lateral to this mass wasn't immediately adjacent smaller satellite mass demonstrating similar features together the 2 masses measured 17 mm x 11 mm x 15 mm. There were no other abnormalities left axilla was negative  #3 patient is now status post lumpectomy of the left breast with sentinel lymph node biopsy the final pathology revealed a 1.6 cm grade 2 invasive lobular carcinoma, ER positive PR positive. HER-2/neu was not repeated and I have requested that the HER-2/neu status be repeated. The biopsy HER-2 was positive. However  the HER-2/neu result is now negative on the final specimen.  #3 s/p radiation therapy to the breast administered by Dr. Gery Pray. She received this from 10/21/2012 through 11/28/2012  #4 began Arimidex 1 mg daily curative intent starting 12/01/2012. Total of 5 years of therapy is planned.  CURRENT THERAPY: arimidex 1 mg  INTERVAL HISTORY: [name] 68 y.o. female returns for followup visit  Overall she seems to be doing well without any significant problems. She denies any fevers chills night sweats headaches shortness of breath chest pains palpitations no myalgias and arthralgias. She is tolerating Arimidex very nicely she denies any aches pains. She has not any vaginal bleeding. Remainder of the 10 point review of systems is negative.  MEDICAL HISTORY: Past Medical History  Diagnosis Date  . Postmenopausal bleeding   . Fibroids   . Headache(784.0)     tension  . Seasonal allergies   . Anemia     due to vaginal bleeding  . Urinary urgency   . Hypertension     under control with med., has been on med. x 3 yr.  . White coat hypertension   . History of endometriosis   . Breast cancer 08/2012    left  . Sleep apnea     uses CPAP nightly  . Arthritis     hands  . Dental crowns present   . Hyperlipidemia   . OSA (obstructive sleep apnea)   . S/P radiation therapy 10/20/2012-12/05/2012    left breast 50.4 gray, lumpectomy cavity boosted to 62.4 gray    ALLERGIES:  is allergic to adhesive.  MEDICATIONS:  Current Outpatient  Prescriptions  Medication Sig Dispense Refill  . anastrozole (ARIMIDEX) 1 MG tablet Take 1 mg by mouth daily.       Marland Kitchen aspirin 81 MG tablet Take 81 mg by mouth daily.      . calcium citrate-vitamin D (CITRACAL+D) 315-200 MG-UNIT per tablet Take 1 tablet by mouth 2 (two) times daily. 600 mg. Calcium, 400 mg. Vit. D3      . Cyanocobalamin (VITAMIN B-12 CR PO) Take 500 mcg by mouth daily.       . fluticasone (FLONASE) 50 MCG/ACT nasal spray Place 2  sprays into both nostrils daily. As needed  16 g  11  . lisinopril-hydrochlorothiazide (PRINZIDE,ZESTORETIC) 10-12.5 MG per tablet Take 0.5 tablets by mouth daily.  90 tablet  1  . Loratadine-Pseudoephedrine (PX ALLERGY RELIEF D, LORATID, PO) Take by mouth as needed.      . lovastatin (MEVACOR) 20 MG tablet Take 1 tablet (20 mg total) by mouth at bedtime.  90 tablet  3  . Omega-3 Fatty Acids (FISH OIL) 1000 MG CAPS Take 1 capsule by mouth daily.      . vitamin C (ASCORBIC ACID) 500 MG tablet Take 1,000 mg by mouth daily.       . Zinc 50 MG TABS Take 1 tablet by mouth daily.      . [DISCONTINUED] LISINOPRIL PO Take 1 tablet by mouth daily.       No current facility-administered medications for this visit.    SURGICAL HISTORY:  Past Surgical History  Procedure Laterality Date  . Hysteroscopy w/d&c  05/06/2009  . Exploratory laparotomy  06-30-2007    ATTEMPTED HYSTERECTOMY ABORTED DUE TO EXTENSIVE ENDOMETRIOSIS W/ DENSE PELVIC ADHESIONS INVOLVING UTERUS AND LOWER RECTOSIGMOID  . Cholecystectomy  1995  . Colonoscopy w/ polypectomy  03/20/2007  . Back surgery  1997  . Dilation and curettage of uterus  2005  . Dilation and curettage of uterus  06/22/2011    Procedure: DILATATION AND CURETTAGE;  Surgeon: Alvino Chapel, MD;  Location: [facility];  Service: Gynecology;  Laterality: N/A;  OK PER KEELA FOR 7:15 START  . Breast lumpectomy with needle localization and axillary sentinel lymph node bx Left 09/03/2012    Procedure: BREAST LUMPECTOMY WITH NEEDLE LOCALIZATION AND AXILLARY SENTINEL LYMPH NODE BX;  Surgeon: Edward Jolly, MD;  Location: [facility];  Service: General;  Laterality: Left;    REVIEW OF SYSTEMS:  Pertinent items are noted in HPI.   HEALTH MAINTENANCE: PHYSICAL EXAMINATION: Blood pressure 150/67, pulse 87, temperature 98.2 F (36.8 C), temperature source Oral, resp. rate 18, height 5' 1"  (1.549 m), weight 192 lb 12.8 oz (87.454 kg).  Body mass index is 36.45 kg/(m^2). ECOG PERFORMANCE STATUS: 0 - Asymptomatic   General appearance: alert, cooperative and appears stated age Resp: clear to auscultation bilaterally Cardio: regular rate and rhythm GI: soft, non-tender; bowel sounds normal; no masses,  no organomegaly Extremities: extremities normal, atraumatic, no cyanosis or edema Neurologic: Grossly normal   LABORATORY DATA: Lab Results  Component Value Date   WBC 5.8 03/26/2013   HGB 13.8 03/26/2013   HCT 40.6 03/26/2013   MCV 87.8 03/26/2013   PLT 239 03/26/2013      Chemistry      Component Value Date/Time   NA 141 03/26/2013 1011   NA 139 03/23/2013 0822   K 4.1 03/26/2013 1011   K 4.4 03/23/2013 0822   CL 100 03/23/2013 0822   CL 104 08/27/2012 1219   CO2  27 03/26/2013 1011   CO2 28 03/23/2013 0822   BUN 15.0 03/26/2013 1011   BUN 17 03/23/2013 0822   CREATININE 0.8 03/26/2013 1011   CREATININE 0.64 03/23/2013 0822   CREATININE 0.65 09/02/2012 1213      Component Value Date/Time   CALCIUM 10.1 03/26/2013 1011   CALCIUM 10.2 03/23/2013 0822   ALKPHOS 112 03/26/2013 1011   ALKPHOS 98 03/23/2013 0822   AST 15 03/26/2013 1011   AST 17 03/23/2013 0822   ALT 13 03/26/2013 1011   ALT 13 03/23/2013 0822   BILITOT 0.57 03/26/2013 1011   BILITOT 0.8 03/23/2013 0822    Diagnosis 1. Breast, lumpectomy, Left - INVASIVE LOBULAR CARCINOMA, SEE COMMENT. - INVASIVE TUMOR IS LESS THAN 0.1MM FROM NEAREST MARGIN (ANTERIOR). - LYMPHOVASCULAR INVASION IDENTIFIED. - LOBULAR CARCINOMA IN SITU. - IN SITU CARCINOMA IS PRESENT AT SUPERIOR AND INFERIOR MARGIN. - ATYPICAL DUCTAL HYPERPLASIA PRESENT - SEE TUMOR TEMPLATE BELOW. 2. Lymph node, sentinel, biopsy, Left axillary - ONE LYMPH NODE, NEGATIVE FOR TUMOR (0/1). - SEE COMMENT. Microscopic Comment 1. BREAST, INVASIVE TUMOR, WITH LYMPH NODE SAMPLING Specimen, including laterality: Left breast Procedure: Lumpectomy Grade: II of III Tubule formation: III Nuclear pleomorphism: II Mitotic:I Tumor size (gross  measurement): 1.6 cm Margins: Invasive, distance to closest margin: Less than 0.42m In-situ, distance to closest margin: Present at margin (see above) If margin positive, focally or broadly: Broadly Lymphovascular invasion: Present Ductal carcinoma in situ:None Grade: N/A Extensive intraductal component: N/A Lobular neoplasia: Present Tumor focality: Unifocal Treatment effect: None If present, treatment effect in breast tissue, lymph nodes or both: N/A 1 of 3 FINAL for AMADI, BONFIGLIO(([id number] Microscopic Comment(continued) Extent of tumor: Skin: N/A Nipple: N/A Skeletal muscle: N/A Lymph nodes: # examined: 1 Lymph nodes with metastasis: 0 Breast prognostic profile: Not repeated Estrogen receptor: Previous study demonstrated 100% positivity (([id number] Progesterone receptor: Previous study demonstrated 63% positivity (([id number] Her 2 neu: Not repeated, previous study demonstrated amplification (6.45) (([id number] Ki-67: Not repeated, previous study demonstrated 58% proliferation rate (([id number] Non-neoplastic breast: Previous biopsy site, fibrocystic change and microcalcifications TNM: pT1c, pN0, pMX Comments: None. 2. Cytokeratin AE1 / AE3 immunostain does not demonstrate any intranodal metastatic epithelial tumor deposits. (CRR:caf 09/04/12) CMaliRUND DO Pathologist, Electronic Signature (Case signed 09/05/2012)    RADIOGRAPHIC STUDIES:  Mr Breast Bilateral W Wo Contrast  08/26/2012   *RADIOLOGY REPORT*  Clinical Data: recent diagnosis of in situ and invasive carcinoma left breast  BUN and creatinine were obtained on site at [facility] [address] Results:  BUN 11 mg/dL,  Creatinine 0.9 mg/dL.  BILATERAL BREAST MRI WITH AND WITHOUT CONTRAST  Technique: Multiplanar, multisequence MR images of both breasts were obtained prior to and following the intravenous administration of 160mof Multihance.  Three dimensional images were evaluated at the  independent DynaCad workstation.  [facility]  [facility]  Findings: There is mild background parenchymal enhancement.  The right breast and axilla are negative.  On the left, there is an anterior biopsy marker clip in the 12 o'clock position.  This is associated with a irregular enhancing mass with spiculated margins.  Posterior and lateral to this mass is an immediately adjacent smaller satellite mass demonstrating similar features.  Together, these masses measure 174mAP) x 49m51materal) x 15mm39maniocaudal).  There are no other parencymal abnormalities.  The left axilla is negative.  IMPRESSION: Dominant, biopsied mass with small immediately adjacent satellite mass consistent with  biopsy results indicating malignancy, measuring 17 x 11 x 20m.  BI-RADS CATEGORY 6:  Known biopsy-proven malignancy - appropriate action should be taken.  THREE-DIMENSIONAL MR IMAGE RENDERING ON INDEPENDENT WORKSTATION:  Three-dimensional MR images were rendered by post-processing of the original MR data on an independent workstation.  The three- dimensional MR images were interpreted, and findings were reported in the accompanying complete MRI report for this study.   Original Report Authenticated By: RSkipper Cliche M.D.   Nm Sentinel Node Inj-no Rpt (breast)  09/03/2012   CLINICAL DATA: cancer of the left breast   Sulfur colloid was injected intradermally by the nuclear medicine  technologist for breast cancer sentinel node localization.     ASSESSMENT: 68year old female with  #1 stage I( T1 C. N0) invasive lobular carcinoma of the left breast status post lumpectomy and sentinel lymph node biopsy. The final pathology reveals a ER/PR positive, repeat HER-2/neu pending with a Ki-67 of 58%. Patient's HER-2/neu on biopsy was positive. Question is whether to treat the patient with chemotherapy. However I have requested pathology to repeat the HER-2/neu on this particular  patient to 2 with lobular features. If the HER-2/neu is negative then we will do an Oncotype DX testing to see if she indeed does need this chemotherapy versus going on to radiation and then antiestrogen therapy.  #2 patient also has significant dysfunctional uterine bleeding I have recommended that we try Zoladex to see if we can just stop the bleeding with this. She is not able to go on hormone replacement therapy due to her diagnosis of breast cancer. Question was whether she could have a IUD placed. If Zoladex does not work then it would be absolutely necessary from oncology perspective to have a IUD placed to help patient with dysfunctional uterine bleeding.  #3 HER-2 nu testing on the final specimen was negative. I have therefore recommended patient proceed with radiation therapy. Once she completes radiation then she will go on to receive an aromatase inhibitor such as Arimidex 1 mg daily for a total of 5 years.   PLAN:  #1 continue Arimidex daily.  #2 vaginal bleeding: No evidence we will continue to monitor she does not need Zoladex injections.  #3 patient will be seen back in 6 months time for followup.  All questions were answered. The patient knows to call the clinic with any problems, questions or concerns. We can certainly see the patient much sooner if necessary.  I spent 25 minutes counseling the patient face to face. The total time spent in the appointment was 30 minutes.    KMarcy Panning MD Medical/Oncology [phone](beeper) [phone](Office)

## 2013-03-26 NOTE — Telephone Encounter (Signed)
appts made and printed...td 

## 2013-03-26 NOTE — Progress Notes (Signed)
  Radiation Oncology         [phone] ________________________________  Name: [name] MRN: [id number]  Date: 03/26/2013  DOB: 1945/06/13  Follow-Up Visit Note  CC: Reginia Forts, MD  Excell Seltzer Darene Lamer, MD  Diagnosis:   Stage I invasive lobular carcinoma of the left breast  Interval Since Last Radiation:  4  months  Narrative:  The patient returns today for routine follow-up.  She is doing well at this time. She has minimal discomfort in the breast. She denies any nipple discharge or bleeding or problems with swelling in her left arm or hand. She denies any side effects with Arimidex.                              ALLERGIES:  is allergic to adhesive.  Meds: Current Outpatient Prescriptions  Medication Sig Dispense Refill  . anastrozole (ARIMIDEX) 1 MG tablet Take 1 mg by mouth daily.       Marland Kitchen aspirin 81 MG tablet Take 81 mg by mouth daily.      . calcium citrate-vitamin D (CITRACAL+D) 315-200 MG-UNIT per tablet Take 1 tablet by mouth 2 (two) times daily. 600 mg. Calcium, 400 mg. Vit. D3      . Cyanocobalamin (VITAMIN B-12 CR PO) Take 500 mcg by mouth daily.       . fluticasone (FLONASE) 50 MCG/ACT nasal spray Place 2 sprays into both nostrils daily. As needed  16 g  11  . lisinopril-hydrochlorothiazide (PRINZIDE,ZESTORETIC) 10-12.5 MG per tablet Take 0.5 tablets by mouth daily.  90 tablet  1  . Loratadine-Pseudoephedrine (PX ALLERGY RELIEF D, LORATID, PO) Take by mouth as needed.      . lovastatin (MEVACOR) 20 MG tablet Take 1 tablet (20 mg total) by mouth at bedtime.  90 tablet  3  . Omega-3 Fatty Acids (FISH OIL) 1000 MG CAPS Take 1 capsule by mouth daily.      . vitamin C (ASCORBIC ACID) 500 MG tablet Take 1,000 mg by mouth daily.       . Zinc 50 MG TABS Take 1 tablet by mouth daily.      . [DISCONTINUED] LISINOPRIL PO Take 1 tablet by mouth daily.       No current facility-administered medications for this encounter.    Physical Findings: The patient is in no  acute distress. Patient is alert and oriented.  weight is 191 lb 11.2 oz (86.955 kg). Her oral temperature is 97.6 F (36.4 C). Her blood pressure is 142/81 and her pulse is 78. Her respiration is 20. Marland Kitchen  No palpable supraclavicular or axillary adenopathy. The lungs are clear to auscultation. The heart has a regular rhythm and rate. Examination of the right breast reveals no mass or nipple discharge. Examination of the left breast area reveals some mild edema in the nipple areolar complex area. The patient's lumpectomy scar is well-healed. There is no dominant mass appreciated in the breast nipple discharge or bleeding.  Lab Findings: Lab Results  Component Value Date   WBC 4.8 03/23/2013   HGB 13.6 03/23/2013   HCT 38.8 03/23/2013   MCV 83.1 03/23/2013   PLT 234 03/23/2013     Radiographic Findings: No results found.  Impression:  No evidence of recurrence on clinical exam today.  Plan:  When necessary followup in radiation oncology. Patient will continue close followup in medical oncology and continue on Arimidex.  ____________________________________ Blair Promise, MD

## 2013-03-26 NOTE — Progress Notes (Signed)
Patient accompanied by her husband is at [facility] for f/u visit with Dr. Humphrey Rolls.  Patient reports that she is doing well.  She reports that she has gained some weight and is ready to work to increase her activity and adjust her diet to lose the weight she has gained.  She is interested in attending the exercise classes offered at [facility].  We also discussed the [facility] classes that are being offered in February and I encouraged her to attend.  I reminded her of her appointment with Dr. Excell Seltzer on 04/09/13.  Patient denied any questions or concerns at this time.  I encouraged her to call me for any needs she may have.

## 2013-03-29 ENCOUNTER — Encounter: Payer: Self-pay | Admitting: Family Medicine

## 2013-04-09 ENCOUNTER — Ambulatory Visit (INDEPENDENT_AMBULATORY_CARE_PROVIDER_SITE_OTHER): Payer: Medicare Other | Admitting: General Surgery

## 2013-04-09 ENCOUNTER — Encounter (INDEPENDENT_AMBULATORY_CARE_PROVIDER_SITE_OTHER): Payer: Self-pay | Admitting: General Surgery

## 2013-04-09 VITALS — BP 130/78 | HR 84 | Temp 98.2°F | Resp 14 | Ht 60.0 in | Wt 191.4 lb

## 2013-04-09 DIAGNOSIS — C50119 Malignant neoplasm of central portion of unspecified female breast: Secondary | ICD-10-CM

## 2013-04-09 NOTE — Progress Notes (Signed)
Chief complaint: Followup breast cancer  History: The patient returns for more long-term followup status post left breast lumpectomy and negative sentinel lymph node biopsy for stage 1A (t1CN0) ER positive invasive cancer of the left breast, surgery date of June 2014. She completed her radiation therapy last month and tolerated this very well. She is now on Arimidex and notices some expected hot flashes and a little bit of mood swings but is tolerating it generally well. no complaints today of breast lumps were pain or swelling or other concerns.  Exam: BP 130/78  Pulse 84  Temp(Src) 98.2 F (36.8 C) (Temporal)  Resp 14  Ht 5' (1.524 m)  Wt 191 lb 6.4 oz (86.818 kg)  BMI 37.38 kg/m2 General: Moderately overweight appears well Lymph nodes: No cervical, subclavicular or axillary nodes palpable Breasts: Very minimal post radiation changes on the left. No palpable masses with particular attention to the lumpectomy site in neither breast.  Assessment and plan: Doing well following lumpectomy and sentinel lymph node biopsy for a stage IA ER positive cancer of the left breast. On Arimidex. Imaging due in June. Return in 6-9 months

## 2013-04-29 DIAGNOSIS — M654 Radial styloid tenosynovitis [de Quervain]: Secondary | ICD-10-CM | POA: Diagnosis not present

## 2013-05-12 ENCOUNTER — Other Ambulatory Visit: Payer: Self-pay | Admitting: Orthopedic Surgery

## 2013-05-13 ENCOUNTER — Encounter (HOSPITAL_BASED_OUTPATIENT_CLINIC_OR_DEPARTMENT_OTHER): Payer: Self-pay | Admitting: *Deleted

## 2013-05-13 ENCOUNTER — Encounter (HOSPITAL_BASED_OUTPATIENT_CLINIC_OR_DEPARTMENT_OTHER)
Admission: RE | Admit: 2013-05-13 | Discharge: 2013-05-13 | Disposition: A | Discharge status: Home / Self Care | Payer: Medicare Other | Source: Ambulatory Visit | Attending: Orthopedic Surgery | Admitting: Orthopedic Surgery

## 2013-05-13 DIAGNOSIS — Z01812 Encounter for preprocedural laboratory examination: Secondary | ICD-10-CM | POA: Insufficient documentation

## 2013-05-13 LAB — BASIC METABOLIC PANEL
BUN: 13 mg/dL (ref 6–23)
CALCIUM: 11.1 mg/dL — AB (ref 8.4–10.5)
CO2: 28 mEq/L (ref 19–32)
Chloride: 99 mEq/L (ref 96–112)
Creatinine, Ser: 0.72 mg/dL (ref 0.50–1.10)
GFR, EST NON AFRICAN AMERICAN: 86 mL/min — AB (ref >90)
Glucose, Bld: 111 mg/dL — ABNORMAL HIGH (ref 70–99)
Potassium: 4.4 mEq/L (ref 3.7–5.3)
Sodium: 138 mEq/L (ref 137–147)

## 2013-05-18 NOTE — H&P (Signed)
[name] is an 68 y.o. female.   Chief Complaint: c/o chronic and progressive STS symptoms right 1st DC HPI: [name] presents for evaluation of right first dorsal compartment stenosing tenosynovitis.  She has significant discomfort with ulnar deviation of her wrist pinch or grip.  She has had prior left lumpectomy followed by chemotherapy and is on long term estrogen denying agents.  She has had wrist pain off and on for the past two months. She now seeks an upper extremity orthopaedic consult.  She jammed her wrist into cabinet in December, 2014.      Past Medical History  Diagnosis Date  . Postmenopausal bleeding   . Fibroids   . Headache(784.0)     tension  . Seasonal allergies   . Anemia     due to vaginal bleeding  . Urinary urgency   . Hypertension     under control with med., has been on med. x 3 yr.  . White coat hypertension   . History of endometriosis   . Breast cancer 08/2012    left  . Sleep apnea     uses CPAP nightly  . Arthritis     hands  . Dental crowns present   . Hyperlipidemia   . OSA (obstructive sleep apnea)   . S/P radiation therapy 10/20/2012-12/05/2012    left breast 50.4 gray, lumpectomy cavity boosted to 62.4 gray    Past Surgical History  Procedure Laterality Date  . Hysteroscopy w/d&c  05/06/2009  . Exploratory laparotomy  06-30-2007    ATTEMPTED HYSTERECTOMY ABORTED DUE TO EXTENSIVE ENDOMETRIOSIS W/ DENSE PELVIC ADHESIONS INVOLVING UTERUS AND LOWER RECTOSIGMOID  . Cholecystectomy  1995  . Colonoscopy w/ polypectomy  03/20/2007  . Dilation and curettage of uterus  2005  . Dilation and curettage of uterus  06/22/2011    Procedure: DILATATION AND CURETTAGE;  Surgeon: Alvino Chapel, MD;  Location: [facility];  Service: Gynecology;  Laterality: N/A;  OK PER KEELA FOR 7:15 START  . Breast lumpectomy with needle localization and axillary sentinel lymph node bx Left 09/03/2012    Procedure: BREAST LUMPECTOMY WITH  NEEDLE LOCALIZATION AND AXILLARY SENTINEL LYMPH NODE BX;  Surgeon: Edward Jolly, MD;  Location: [place];  Service: General;  Laterality: Left;  . Back surgery  1997    lumbar    Family History  Problem Relation Age of Onset  . Hypertension Other   . Vision loss Mother   . Hypertension Mother   . Anesthesia problems Mother     post-op N/V  . Heart disease Mother   . Osteoporosis Mother   . Depression Father   . Heart disease Sister   . Hypertension Sister   . Diabetes Brother   . Hypertension Brother   . Cancer Brother     skin  . Hyperlipidemia Brother   . COPD Daughter   . Cancer Cousin   . Kidney failure Maternal Grandmother   . Heart disease Maternal Grandfather    Social History:  reports that she has never smoked. She has never used smokeless tobacco. She reports that she does not drink alcohol or use illicit drugs.  Allergies:  Allergies  Allergen Reactions  . Adhesive [Tape] Rash    No prescriptions prior to admission    No results found for this or any previous visit (from the past 66 hour(s)).  No results found.   Pertinent items are noted in HPI.  Height 5' (1.524 m), weight  86.183 kg (190 lb).  General appearance: alert Head: Normocephalic, without obvious abnormality Neck: supple, symmetrical, trachea midline Resp: clear to auscultation bilaterally Cardio: regular rate and rhythm GI: normal findings: bowel sounds normal Extremities:  Inspection of her hands and wrists reveals swelling of her right first dorsal compartment.  She is tender over the first dorsal compartment.  She has a positive Finkelstein maneuver. She has background osteoarthritis with Heberden's and Bouchard's nodes.  Pulse and cap refill are intact.  Motor and sensory examination is intact.  She reports triggering of her fingers in the past although on examination at this time she has no active triggering.  X-rays of her wrist AP and lateral demonstrate  normal bony anatomy.  Pulses: 2+ and symmetric Skin: normal Neurologic: Grossly normal    Assessment/Plan Impression: DeQuervain's STS right wrist  Plan: To the OR for release right first DC.The procedure, risks,benefits and post-op course were discussed with the patient at length and they were in agreement with the plan.  DASNOIT,Arden Axon J 05/18/2013, 2:05 PM   H&P documentation: 05/19/2013  -History and Physical Reviewed  -Patient has been re-examined  -No change in the plan of care  Cammie Sickle, MD

## 2013-05-19 ENCOUNTER — Encounter (HOSPITAL_BASED_OUTPATIENT_CLINIC_OR_DEPARTMENT_OTHER): Payer: Medicare Other | Admitting: Anesthesiology

## 2013-05-19 ENCOUNTER — Ambulatory Visit (HOSPITAL_BASED_OUTPATIENT_CLINIC_OR_DEPARTMENT_OTHER): Payer: Medicare Other | Admitting: Anesthesiology

## 2013-05-19 ENCOUNTER — Encounter (HOSPITAL_BASED_OUTPATIENT_CLINIC_OR_DEPARTMENT_OTHER)
Admission: RE | Disposition: A | Discharge status: Home / Self Care | Payer: Self-pay | Source: Ambulatory Visit | Attending: Orthopedic Surgery

## 2013-05-19 ENCOUNTER — Encounter (HOSPITAL_BASED_OUTPATIENT_CLINIC_OR_DEPARTMENT_OTHER): Payer: Self-pay | Admitting: Orthopedic Surgery

## 2013-05-19 ENCOUNTER — Ambulatory Visit (HOSPITAL_BASED_OUTPATIENT_CLINIC_OR_DEPARTMENT_OTHER)
Admission: RE | Admit: 2013-05-19 | Discharge: 2013-05-19 | Disposition: A | Discharge status: Home / Self Care | Payer: Medicare Other | Source: Ambulatory Visit | Attending: Orthopedic Surgery | Admitting: Orthopedic Surgery

## 2013-05-19 DIAGNOSIS — D649 Anemia, unspecified: Secondary | ICD-10-CM | POA: Insufficient documentation

## 2013-05-19 DIAGNOSIS — Z9109 Other allergy status, other than to drugs and biological substances: Secondary | ICD-10-CM | POA: Diagnosis not present

## 2013-05-19 DIAGNOSIS — E785 Hyperlipidemia, unspecified: Secondary | ICD-10-CM | POA: Diagnosis not present

## 2013-05-19 DIAGNOSIS — Z853 Personal history of malignant neoplasm of breast: Secondary | ICD-10-CM | POA: Diagnosis not present

## 2013-05-19 DIAGNOSIS — I10 Essential (primary) hypertension: Secondary | ICD-10-CM | POA: Insufficient documentation

## 2013-05-19 DIAGNOSIS — G4733 Obstructive sleep apnea (adult) (pediatric): Secondary | ICD-10-CM | POA: Diagnosis not present

## 2013-05-19 DIAGNOSIS — M65849 Other synovitis and tenosynovitis, unspecified hand: Principal | ICD-10-CM

## 2013-05-19 DIAGNOSIS — M65839 Other synovitis and tenosynovitis, unspecified forearm: Secondary | ICD-10-CM | POA: Diagnosis not present

## 2013-05-19 DIAGNOSIS — M654 Radial styloid tenosynovitis [de Quervain]: Secondary | ICD-10-CM | POA: Insufficient documentation

## 2013-05-19 HISTORY — PX: DORSAL COMPARTMENT RELEASE: SHX5039

## 2013-05-19 LAB — POCT HEMOGLOBIN-HEMACUE: Hemoglobin: 13 g/dL (ref 12.0–15.0)

## 2013-05-19 SURGERY — RELEASE, FIRST DORSAL COMPARTMENT, HAND
Anesthesia: Monitor Anesthesia Care | Site: Wrist | Laterality: Right

## 2013-05-19 MED ORDER — PROPOFOL 10 MG/ML IV BOLUS
INTRAVENOUS | Status: DC | PRN
  Administered 2013-05-19: 70 mg via INTRAVENOUS
  Administered 2013-05-19 (×3): 10 mg via INTRAVENOUS

## 2013-05-19 MED ORDER — CHLORHEXIDINE GLUCONATE 4 % EX LIQD: 60.0000 mL | Freq: Once | CUTANEOUS | Status: DC

## 2013-05-19 MED ORDER — ONDANSETRON HCL 4 MG/2ML IJ SOLN: 4.0000 mg | Freq: Once | INTRAMUSCULAR | Status: DC | PRN

## 2013-05-19 MED ORDER — ACETAMINOPHEN-CODEINE #3 300-30 MG PO TABS: 1.0000 | ORAL_TABLET | ORAL | Status: DC | PRN

## 2013-05-19 MED ORDER — FENTANYL CITRATE 0.05 MG/ML IJ SOLN: 50.0000 ug | INTRAMUSCULAR | Status: DC | PRN

## 2013-05-19 MED ORDER — MIDAZOLAM HCL 2 MG/2ML IJ SOLN: 1.0000 mg | INTRAMUSCULAR | Status: DC | PRN

## 2013-05-19 MED ORDER — MIDAZOLAM HCL 5 MG/5ML IJ SOLN
INTRAMUSCULAR | Status: DC | PRN
  Administered 2013-05-19 (×2): 1 mg via INTRAVENOUS

## 2013-05-19 MED ORDER — FENTANYL CITRATE 0.05 MG/ML IJ SOLN
INTRAMUSCULAR | Status: DC | PRN
  Administered 2013-05-19 (×2): 50 ug via INTRAVENOUS

## 2013-05-19 MED ORDER — HYDROMORPHONE HCL PF 1 MG/ML IJ SOLN: 0.2500 mg | INTRAMUSCULAR | Status: DC | PRN

## 2013-05-19 MED ORDER — OXYCODONE HCL 5 MG/5ML PO SOLN: 5.0000 mg | Freq: Once | ORAL | Status: DC | PRN

## 2013-05-19 MED ORDER — ONDANSETRON HCL 4 MG/2ML IJ SOLN
INTRAMUSCULAR | Status: DC | PRN
  Administered 2013-05-19: 4 mg via INTRAVENOUS

## 2013-05-19 MED ORDER — FENTANYL CITRATE 0.05 MG/ML IJ SOLN
INTRAMUSCULAR | Status: AC
Start: 2013-05-19 — End: 2013-05-19
  Filled 2013-05-19: qty 4

## 2013-05-19 MED ORDER — OXYCODONE HCL 5 MG PO TABS: 5.0000 mg | ORAL_TABLET | Freq: Once | ORAL | Status: DC | PRN

## 2013-05-19 MED ORDER — LACTATED RINGERS IV SOLN
INTRAVENOUS | Status: DC
  Administered 2013-05-19: 08:00:00 via INTRAVENOUS

## 2013-05-19 MED ORDER — MEPERIDINE HCL 25 MG/ML IJ SOLN: 6.2500 mg | INTRAMUSCULAR | Status: DC | PRN

## 2013-05-19 MED ORDER — MIDAZOLAM HCL 2 MG/2ML IJ SOLN
INTRAMUSCULAR | Status: AC
  Filled 2013-05-19: qty 2

## 2013-05-19 MED ORDER — LIDOCAINE HCL 2 % IJ SOLN
INTRAMUSCULAR | Status: DC | PRN
  Administered 2013-05-19: 3 mL

## 2013-05-19 MED ORDER — LACTATED RINGERS IV SOLN: INTRAVENOUS | Status: DC

## 2013-05-19 SURGICAL SUPPLY — 47 items
BANDAGE COBAN STERILE 2 (GAUZE/BANDAGES/DRESSINGS) ×3 IMPLANT
BANDAGE ELASTIC 3 VELCRO ST LF (GAUZE/BANDAGES/DRESSINGS) IMPLANT
BLADE MINI RND TIP GREEN BEAV (BLADE) IMPLANT
BLADE SURG 15 STRL LF DISP TIS (BLADE) ×1 IMPLANT
BLADE SURG 15 STRL SS (BLADE) ×2
BNDG COHESIVE 3X5 TAN STRL LF (GAUZE/BANDAGES/DRESSINGS) IMPLANT
BNDG ESMARK 4X9 LF (GAUZE/BANDAGES/DRESSINGS) IMPLANT
BRUSH SCRUB EZ PLAIN DRY (MISCELLANEOUS) ×3 IMPLANT
CLOSURE WOUND 1/2 X4 (GAUZE/BANDAGES/DRESSINGS) ×1
CORDS BIPOLAR (ELECTRODE) ×3 IMPLANT
COVER MAYO STAND STRL (DRAPES) ×3 IMPLANT
COVER TABLE BACK 60X90 (DRAPES) ×3 IMPLANT
CUFF TOURNIQUET SINGLE 18IN (TOURNIQUET CUFF) IMPLANT
DECANTER SPIKE VIAL GLASS SM (MISCELLANEOUS) IMPLANT
DRAPE EXTREMITY T 121X128X90 (DRAPE) ×3 IMPLANT
DRAPE SURG 17X23 STRL (DRAPES) ×3 IMPLANT
DRSG TEGADERM 4X4.75 (GAUZE/BANDAGES/DRESSINGS) IMPLANT
GLOVE BIOGEL M STRL SZ7.5 (GLOVE) IMPLANT
GLOVE BIOGEL PI IND STRL 7.0 (GLOVE) ×1 IMPLANT
GLOVE BIOGEL PI IND STRL 7.5 (GLOVE) ×1 IMPLANT
GLOVE BIOGEL PI INDICATOR 7.0 (GLOVE) ×2
GLOVE BIOGEL PI INDICATOR 7.5 (GLOVE) ×2
GLOVE ORTHO TXT STRL SZ7.5 (GLOVE) ×3 IMPLANT
GLOVE SURG SS PI 6.5 STRL IVOR (GLOVE) ×3 IMPLANT
GLOVE SURG SS PI 7.0 STRL IVOR (GLOVE) ×3 IMPLANT
GLOVE SURG SS PI 7.5 STRL IVOR (GLOVE) ×3 IMPLANT
GOWN STRL REUS W/ TWL LRG LVL3 (GOWN DISPOSABLE) ×2 IMPLANT
GOWN STRL REUS W/ TWL XL LVL3 (GOWN DISPOSABLE) ×1 IMPLANT
GOWN STRL REUS W/TWL LRG LVL3 (GOWN DISPOSABLE) ×4
GOWN STRL REUS W/TWL XL LVL3 (GOWN DISPOSABLE) ×2
NEEDLE 27GAX1X1/2 (NEEDLE) ×3 IMPLANT
PACK BASIN DAY SURGERY FS (CUSTOM PROCEDURE TRAY) ×3 IMPLANT
PAD CAST 3X4 CTTN HI CHSV (CAST SUPPLIES) IMPLANT
PADDING CAST ABS 4INX4YD NS (CAST SUPPLIES) ×2
PADDING CAST ABS COTTON 4X4 ST (CAST SUPPLIES) ×1 IMPLANT
PADDING CAST COTTON 3X4 STRL (CAST SUPPLIES)
SLEEVE SCD COMPRESS KNEE MED (MISCELLANEOUS) IMPLANT
SPONGE GAUZE 4X4 12PLY (GAUZE/BANDAGES/DRESSINGS) ×3 IMPLANT
STOCKINETTE 4X48 STRL (DRAPES) ×3 IMPLANT
STRIP CLOSURE SKIN 1/2X4 (GAUZE/BANDAGES/DRESSINGS) ×2 IMPLANT
SUT PROLENE 3 0 PS 2 (SUTURE) ×3 IMPLANT
SUT VIC AB 4-0 P-3 18XBRD (SUTURE) IMPLANT
SUT VIC AB 4-0 P3 18 (SUTURE)
SYR 3ML 23GX1 SAFETY (SYRINGE) IMPLANT
SYR CONTROL 10ML LL (SYRINGE) ×3 IMPLANT
TRAY DSU PREP LF (CUSTOM PROCEDURE TRAY) ×3 IMPLANT
UNDERPAD 30X30 INCONTINENT (UNDERPADS AND DIAPERS) ×3 IMPLANT

## 2013-05-19 NOTE — Discharge Instructions (Addendum)

## 2013-05-19 NOTE — Op Note (Signed)
[id number] 

## 2013-05-19 NOTE — Anesthesia Postprocedure Evaluation (Signed)
Anesthesia Post Note  Patient: [name]  Procedure(s) Performed: Procedure(s) (LRB): RELEASE 1ST  DORSAL COMPARTMENT RIGHT (DEQUERVAIN) (Right)  Anesthesia type: general  Patient location: PACU  Post pain: Pain level controlled  Post assessment: Patient's Cardiovascular Status Stable  Last Vitals:  Filed Vitals:   05/19/13 1056  BP: 121/64  Pulse: 65  Temp: 36.5 C  Resp: 16    Post vital signs: Reviewed and stable  Level of consciousness: sedated  Complications: No apparent anesthesia complications

## 2013-05-19 NOTE — Brief Op Note (Signed)
05/19/2013  10:20 AM  PATIENT:  [name]  68 y.o. female  PRE-OPERATIVE DIAGNOSIS:  DEQUERVAINS RIGHT   POST-OPERATIVE DIAGNOSIS:  DEQUERVAINS RIGHT  PROCEDURE:  Procedure(s): RELEASE 1ST  DORSAL COMPARTMENT RIGHT (DEQUERVAIN) (Right)  SURGEON:  Surgeon(s) and Role:    * Cammie Sickle., MD - Primary  PHYSICIAN ASSISTANT:   ASSISTANTS: Kathyrn Sheriff.A-C   ANESTHESIA:   MAC  EBL:  Total I/O In: 800 [I.V.:800] Out: -   BLOOD ADMINISTERED:none  DRAINS: none   LOCAL MEDICATIONS USED:  XYLOCAINE   SPECIMEN:  No Specimen  DISPOSITION OF SPECIMEN:  N/A  COUNTS:  YES  TOURNIQUET:  * Missing tourniquet times found for documented tourniquets in log:  010071 *  DICTATION: .Other Dictation: Dictation Number [id number]  PLAN OF CARE: Discharge to home after PACU  PATIENT DISPOSITION:  PACU - hemodynamically stable.   Delay start of Pharmacological VTE agent (>24hrs) due to surgical blood loss or risk of bleeding: not applicable

## 2013-05-19 NOTE — Transfer of Care (Signed)
Immediate Anesthesia Transfer of Care Note  Patient: [name]  Procedure(s) Performed: Procedure(s): RELEASE 1ST  DORSAL COMPARTMENT RIGHT (DEQUERVAIN) (Right)  Patient Location: PACU  Anesthesia Type:MAC  Level of Consciousness: awake, alert  and oriented  Airway & Oxygen Therapy: Patient Spontanous Breathing  Post-op Assessment: Report given to PACU RN and Post -op Vital signs reviewed and stable  Post vital signs: Reviewed and stable  Complications: No apparent anesthesia complications

## 2013-05-19 NOTE — Anesthesia Preprocedure Evaluation (Signed)
Anesthesia Evaluation  Patient identified by MRN, date of birth, ID band Patient awake    Reviewed: Allergy & Precautions, H&P , NPO status , Patient's Chart, lab work & pertinent test results  Airway Mallampati: I TM Distance: >3 FB Neck ROM: Full    Dental   Pulmonary          Cardiovascular hypertension, Pt. on medications     Neuro/Psych    GI/Hepatic   Endo/Other    Renal/GU      Musculoskeletal   Abdominal   Peds  Hematology   Anesthesia Other Findings   Reproductive/Obstetrics                           Anesthesia Physical Anesthesia Plan  ASA: II  Anesthesia Plan: General   Post-op Pain Management:    Induction: Intravenous  Airway Management Planned: LMA  Additional Equipment:   Intra-op Plan:   Post-operative Plan: Extubation in OR  Informed Consent: I have reviewed the patients History and Physical, chart, labs and discussed the procedure including the risks, benefits and alternatives for the proposed anesthesia with the patient or authorized representative who has indicated his/her understanding and acceptance.     Plan Discussed with: CRNA and Surgeon  Anesthesia Plan Comments:         Anesthesia Quick Evaluation  

## 2013-05-20 ENCOUNTER — Encounter (HOSPITAL_BASED_OUTPATIENT_CLINIC_OR_DEPARTMENT_OTHER): Payer: Self-pay | Admitting: Orthopedic Surgery

## 2013-05-20 NOTE — Op Note (Signed)
NAMEMarland Kitchen  Diane, [name]              ACCOUNT NO.:  [id number]  MEDICAL RECORD NO.:  [id number]  LOCATION:                                 FACILITY:  PHYSICIAN:  Youlanda Mighty. Keaden Gunnoe, M.D. DATE OF BIRTH:  07-04-1945  DATE OF PROCEDURE:  05/19/2013 DATE OF DISCHARGE:                              OPERATIVE REPORT   PREOPERATIVE DIAGNOSIS:  Severe stenosing tenosynovitis, status right first dorsal compartment.  POSTOPERATIVE DIAGNOSIS:  Severe stenosing tenosynovitis, status right first dorsal compartment.  OPERATION:  Release of first dorsal compartment with resection of septum.  OPERATING SURGEON:  Youlanda Mighty. Eugenio Dollins, M.D.  ASSISTANT:  Surgical technician.  ANESTHESIA:  2% lidocaine field block and first dorsal compartment block supplemented by IV sedation.  SUPERVISING ANESTHESIOLOGIST:  Dr. Conrad Joes.  INDICATIONS:  [name] is a 68 year old woman referred for evaluation and management of a painful radial wrist.  She has been undergoing treatment for breast cancer including lumpectomy, node dissection, and long-term chemotherapy.  She is on estrogen denying agent.  She presented for evaluation of marked pain on the radial aspect of her right wrist and was noted to have severe de Quervain stenosing tenosynovitis.  We advised her to proceed with release of the first dorsal compartment at this time.  Questions regarding anticipated procedure were invited and answered detail.  PROCEDURE:  [name] was interviewed in the holding area and her right wrist marked as proper surgical site per protocol with a marking pen.  She was transferred to [facility] of [facility] where under Dr. Everlene Other direct supervision, IV sedation was provided.  The right hand and arm were then prepped with Betadine soap and solution, sterilely draped.  Following a routine surgical time-out, 2% lidocaine was infiltrated at the path of intended incision and the first dorsal  compartment.  After few moments, excellent anesthesia was achieved.  Following exsanguination of the right hand and arm with Esmarch bandage, arterial tourniquet inflated to 220 mmHg.  Procedure commenced with a short transverse incision directly over the palpably thickened first dorsal compartment.  Subcutaneous tissues were carefully divided taking care to identify the proximal margin of the first dorsal compartment.  The radial superficial sensory branches were gently retracted with Ragnell blunt retractors.  There was quite a bit of synovitis proximal in the first dorsal compartment.  The compartment split in its midline.  There was a single slip of the extensor pollicis brevis dorsally, 2 slips of the abductor pollicis palmar.  There was a thick septum between the 2.  The nature of the compartment was rather unusual with a very gritty substance.  There is a small myxoid cyst forming directly over the extensor pollicis brevis.  Following release of the compartment, free range of motion of the thumb and wrist was recovered.  The wound was then repaired with intradermal 3- 0 Prolene suture and Steri-Strips.  Compressive dressing applied with sterile gauze and a Coban wrap.  For aftercare, [name] was provided prescription for Tylenol with Codeine #3, 1 or 2 tablets p.o. q.4-6 hours p.r.n. pain, 30 tablets, no refill.     Youlanda Mighty Emilie Carp, M.D.     RVS/MEDQ  D:  05/19/2013  T:  05/20/2013  Job:  [id number]  cc:   Marcy Panning, M.D.

## 2013-06-12 ENCOUNTER — Encounter: Payer: Self-pay | Admitting: Gastroenterology

## 2013-07-06 ENCOUNTER — Ambulatory Visit: Payer: Medicare Other | Admitting: *Deleted

## 2013-07-06 VITALS — Ht 60.0 in | Wt 200.4 lb

## 2013-07-06 DIAGNOSIS — Z86010 Personal history of colonic polyps: Secondary | ICD-10-CM

## 2013-07-06 MED ORDER — PREPOPIK 10-3.5-12 MG-GM-GM PO PACK: PACK | ORAL | Status: DC

## 2013-07-06 NOTE — Progress Notes (Signed)
Patient denies any allergies to eggs or soy. Patient denies any problems with anesthesia. No oxygen use at home. Uses CPAP. No diet pills per patient.  emmi information for colonoscopy given to patient.

## 2013-07-07 ENCOUNTER — Telehealth: Payer: Self-pay | Admitting: Gastroenterology

## 2013-07-07 NOTE — Telephone Encounter (Signed)
Pt not able to afford prep- $135.00  Sample prep left at front desk and she states she will pick up today or tomorrow

## 2013-07-23 ENCOUNTER — Telehealth: Payer: Self-pay

## 2013-07-23 NOTE — Telephone Encounter (Signed)
Lucy from the [employer] called and left a message (Wednesday 07/22/13 3:40pm) to request sleep study records and notes where patient was seen for her sleep apnea here. I tried to call her back to ask that she send [place] a written request via fax but could not get through to her or a voicemail for her. CB #: [phone]

## 2013-07-28 ENCOUNTER — Encounter: Payer: Self-pay | Admitting: Gastroenterology

## 2013-07-28 ENCOUNTER — Ambulatory Visit: Payer: Medicare Other | Admitting: Gastroenterology

## 2013-07-28 VITALS — BP 132/74 | HR 77 | Temp 98.3°F | Resp 21 | Ht 60.0 in | Wt 200.0 lb

## 2013-07-28 DIAGNOSIS — Z1211 Encounter for screening for malignant neoplasm of colon: Secondary | ICD-10-CM | POA: Diagnosis not present

## 2013-07-28 DIAGNOSIS — G4733 Obstructive sleep apnea (adult) (pediatric): Secondary | ICD-10-CM | POA: Diagnosis not present

## 2013-07-28 DIAGNOSIS — I10 Essential (primary) hypertension: Secondary | ICD-10-CM | POA: Diagnosis not present

## 2013-07-28 DIAGNOSIS — Z86010 Personal history of colonic polyps: Secondary | ICD-10-CM

## 2013-07-28 MED ORDER — SODIUM CHLORIDE 0.9 % IV SOLN: 500.0000 mL | INTRAVENOUS | Status: DC

## 2013-07-28 MED ORDER — FLEET ENEMA 7-19 GM/118ML RE ENEM
1.0000 | ENEMA | Freq: Once | RECTAL | Status: AC
  Administered 2013-07-28: 1 via RECTAL

## 2013-07-28 NOTE — Op Note (Signed)
[facility]  [address] [place], [address]   COLONOSCOPY PROCEDURE REPORT  PATIENT: [name], [name]  MR#: [id number] BIRTHDATE: 01/28/1946 , 67  yrs. old GENDER: Female ENDOSCOPIST: [name], MD, [facility] PROCEDURE DATE:  07/28/2013 PROCEDURE:   Colonoscopy, surveillance First Screening Colonoscopy - Avg.  risk and is 50 yrs.  old or older - No.  Prior Negative Screening - Now for repeat screening. N/A  History of Adenoma - Now for follow-up colonoscopy & has been > or = to 3 yrs.  Yes hx of adenoma.  Has been 3 or more years since last colonoscopy.  Polyps Removed Today? No.  Recommend repeat exam, <10 yrs? No. ASA CLASS:   Class II INDICATIONS:Patient's personal history of colon polyps. MEDICATIONS: MAC sedation, administered by CRNA and propofol (Diprivan) 200mg  IV DESCRIPTION OF PROCEDURE:   After the risks benefits and alternatives of the procedure were thoroughly explained, informed consent was obtained.  A digital rectal exam revealed no abnormalities of the rectum.   The LB [id number]  endoscope was introduced through the anus and advanced to the cecum, which was identified by both the appendix and ileocecal valve. No adverse events experienced.   The quality of the prep was Prepopik good The instrument was then slowly withdrawn as the colon was fully examined.  COLON FINDINGS: Mild diverticulosis was noted in the descending colon and sigmoid colon.   The colon was otherwise normal.  There was no diverticulosis, inflammation, polyps or cancers unless previously stated.  Retroflexed views revealed mdoereate internal hemorrhoids. The time to cecum=2 minutes 02 seconds.  Withdrawal time=11 minutes 02 seconds.  The scope was withdrawn and the procedure completed. COMPLICATIONS: There were no complications.  ENDOSCOPIC IMPRESSION: 1.   Mild diverticulosis in the descending colon and sigmoid colon 2.   Moderate internal  hemorrhoids  RECOMMENDATIONS: 1.  High fiber diet with liberal fluid intake. 2.  You should continue to follow colorectal cancer screening guidelines for "routine risk" patients with a repeat colonoscopy in 10 years.  There is no need for routine, screening FOBT (stool) testing for at least 5 years.  eSigned:  [name], MD, Marval Regal 07/28/2013 2:22 PM   cc: Reginia Forts, MD

## 2013-07-28 NOTE — Progress Notes (Signed)
A/ox3 pleased with MAC, report to Kristin RN 

## 2013-07-28 NOTE — Patient Instructions (Signed)
YOU HAD AN ENDOSCOPIC PROCEDURE TODAY AT [facility]: Refer to the procedure report that was given to you for any specific questions about what was found during the examination.  If the procedure report does not answer your questions, please call your gastroenterologist to clarify.  If you requested that your care partner not be given the details of your procedure findings, then the procedure report has been included in a sealed envelope for you to review at your convenience later.  YOU SHOULD EXPECT: Some feelings of bloating in the abdomen. Passage of more gas than usual.  Walking can help get rid of the air that was put into your GI tract during the procedure and reduce the bloating. If you had a lower endoscopy (such as a colonoscopy or flexible sigmoidoscopy) you may notice spotting of blood in your stool or on the toilet paper. If you underwent a bowel prep for your procedure, then you may not have a normal bowel movement for a few days.  DIET: Your first meal following the procedure should be a light meal and then it is ok to progress to your normal diet.  A half-sandwich or bowl of soup is an example of a good first meal.  Heavy or fried foods are harder to digest and may make you feel nauseous or bloated.  Likewise meals heavy in dairy and vegetables can cause extra gas to form and this can also increase the bloating.  Drink plenty of fluids but you should avoid alcoholic beverages for 24 hours.  ACTIVITY: Your care partner should take you home directly after the procedure.  You should plan to take it easy, moving slowly for the rest of the day.  You can resume normal activity the day after the procedure however you should NOT DRIVE or use heavy machinery for 24 hours (because of the sedation medicines used during the test).    SYMPTOMS TO REPORT IMMEDIATELY: A gastroenterologist can be reached at any hour.  During normal business hours, 8:30 AM to 5:00 PM Monday through Friday,  call [phone].  After hours and on weekends, please call the GI answering service at [phone] who will take a message and have the physician on call contact you.   Following lower endoscopy (colonoscopy or flexible sigmoidoscopy):  Excessive amounts of blood in the stool  Significant tenderness or worsening of abdominal pains  Swelling of the abdomen that is new, acute  Fever of 100F or higher  FOLLOW UP: Our staff will call the home number listed on your records the next business day following your procedure to check on you and address any questions or concerns that you may have at that time regarding the information given to you following your procedure. This is a courtesy call and so if there is no answer at the home number and we have not heard from you through the emergency physician on call, we will assume that you have returned to your regular daily activities without incident.  SIGNATURES/CONFIDENTIALITY: You and/or your care partner have signed paperwork which will be entered into your electronic medical record.  These signatures attest to the fact that that the information above on your After Visit Summary has been reviewed and is understood.  Full responsibility of the confidentiality of this discharge information lies with you and/or your care-partner.  Please continue your normal medications  Please read over handouts about diverticulosis and high fiber diets  Next colonoscopy- 10 years

## 2013-07-29 ENCOUNTER — Telehealth: Payer: Self-pay | Admitting: *Deleted

## 2013-07-29 NOTE — Telephone Encounter (Signed)
Message copied by Cathi Roan on Wed Jul 29, 2013  9:48 AM ------      Message from: Dory Horn      Created: Wed Jul 22, 2013  2:26 PM      Regarding: Candidate for oral appliance       Marlow Hendrie, before I give this to Dr. Brett Fairy to sign, would you please review the patients ov notes and sleep study (last ov documented in Epic/the rest of her notes are in [facility])?  She listed her reason for requesting an oral appliance was due to an extended vacation for about 1 month in the [place].  She feels that the maintenance on the CPAP would be cumbersome with having to bring detergent, etc.  Her biggest concern is that she wants the oral appliance to be as effective as the CPAP. ------

## 2013-07-29 NOTE — Telephone Encounter (Signed)
  Follow up Call-  Call back number 07/28/2013  Post procedure Call Back phone  # [phone]  Permission to leave phone message Yes     Patient questions:  Do you have a fever, pain , or abdominal swelling? no Pain Score  0 *  Have you tolerated food without any problems? yes  Have you been able to return to your normal activities? yes  Do you have any questions about your discharge instructions: Diet   no Medications  no Follow up visit  no  Do you have questions or concerns about your Care? no  Actions: * If pain score is 4 or above: No action needed, pain <4.

## 2013-07-29 NOTE — Telephone Encounter (Signed)
Called pt to let her know that because her sleep apnea is severe and worsens significant during REM sleep that oral appliance therapy may not be the best option for her even in the short term.  Especially as she is able to tolerate CPAP well.  I explained if she got it, she would likely find she wouldn't sleep as well as with CPAP due to sleep apnea, she responds, "and then I would be without it because I didn't bring it with me."  I recommended buying a gallon of distilled water once she is at her destination rather than trying to pack it and also using a small trial size bottle of whatever she cleans her mask in.    I was not able to mention this, but Medicare will likely not cover this for her unless we document that she is unable to tolerate CPAP therapy, therefore she would have to pay out of pocket for this.

## 2013-07-30 ENCOUNTER — Telehealth: Payer: Self-pay | Admitting: *Deleted

## 2013-07-30 NOTE — Telephone Encounter (Signed)
[place] PT. NOTICED SWELLING YESTERDAY EVENING. SHE ELEVATED HER LEFT HAND LAST NIGHT WHICH HELPED A LITTLE. THIS MORNING HER LEFT RING FINGER IS SWOLLEN AND PAINFUL AT A SCALE OF SEVEN WITH MOVEMENT, AT REST THE PAIN IS AT A TWO. PT. HAS NOT TAKEN ANYTHING FOR PAIN. THE FINGER IS WARM BUT NO REDNESS. VERBAL ORDER AND READ BACK TO LINDSEY CORNETTO,NP- MAY CALL [facility]  THREE TIMES A DAY #21. HAVE PT. CALL FOR AN APPOINTMENT WHEN SHE RETURNS FROM THE [place]. NOTIFIED PT. OF THE ABOVE INSTRUCTIONS. SHE HAS HAD HER HAND ELEVATED AND THE SWELLING IF IMPROVED. THE PAIN WITH MOVEMENT HAS DECREASED. PT. FEELS SHE DOES NOT NEED THE KEFLEX. SHE WILL CALL THIS OFFICE IF HER CONDITION WORSEN. NOTIFIED LINDSEY CORNETTO,NP OF PT.'S DECISION.

## 2013-08-04 NOTE — Telephone Encounter (Signed)
Called [facility] and informed them that they need to get sleep study records from Dr. Brett Fairy at [facility].

## 2013-09-11 DIAGNOSIS — M5430 Sciatica, unspecified side: Secondary | ICD-10-CM | POA: Diagnosis not present

## 2013-09-11 DIAGNOSIS — M999 Biomechanical lesion, unspecified: Secondary | ICD-10-CM | POA: Diagnosis not present

## 2013-09-11 DIAGNOSIS — R932 Abnormal findings on diagnostic imaging of liver and biliary tract: Secondary | ICD-10-CM | POA: Diagnosis not present

## 2013-09-11 DIAGNOSIS — IMO0002 Reserved for concepts with insufficient information to code with codable children: Secondary | ICD-10-CM | POA: Diagnosis not present

## 2013-09-14 DIAGNOSIS — M999 Biomechanical lesion, unspecified: Secondary | ICD-10-CM | POA: Diagnosis not present

## 2013-09-14 DIAGNOSIS — IMO0002 Reserved for concepts with insufficient information to code with codable children: Secondary | ICD-10-CM | POA: Diagnosis not present

## 2013-09-14 DIAGNOSIS — R932 Abnormal findings on diagnostic imaging of liver and biliary tract: Secondary | ICD-10-CM | POA: Diagnosis not present

## 2013-09-14 DIAGNOSIS — M5430 Sciatica, unspecified side: Secondary | ICD-10-CM | POA: Diagnosis not present

## 2013-09-15 DIAGNOSIS — M5430 Sciatica, unspecified side: Secondary | ICD-10-CM | POA: Diagnosis not present

## 2013-09-15 DIAGNOSIS — M999 Biomechanical lesion, unspecified: Secondary | ICD-10-CM | POA: Diagnosis not present

## 2013-09-15 DIAGNOSIS — M47817 Spondylosis without myelopathy or radiculopathy, lumbosacral region: Secondary | ICD-10-CM | POA: Diagnosis not present

## 2013-09-15 DIAGNOSIS — R932 Abnormal findings on diagnostic imaging of liver and biliary tract: Secondary | ICD-10-CM | POA: Diagnosis not present

## 2013-09-15 DIAGNOSIS — IMO0002 Reserved for concepts with insufficient information to code with codable children: Secondary | ICD-10-CM | POA: Diagnosis not present

## 2013-09-16 ENCOUNTER — Telehealth: Payer: Self-pay | Admitting: Hematology and Oncology

## 2013-09-16 NOTE — Telephone Encounter (Signed)
, °

## 2013-09-23 ENCOUNTER — Other Ambulatory Visit: Payer: Medicare Other

## 2013-09-23 ENCOUNTER — Ambulatory Visit: Payer: Medicare Other | Admitting: Oncology

## 2013-09-28 ENCOUNTER — Ambulatory Visit: Payer: Medicare Other | Admitting: Oncology

## 2013-09-28 ENCOUNTER — Other Ambulatory Visit: Payer: Medicare Other

## 2013-09-29 DIAGNOSIS — Z853 Personal history of malignant neoplasm of breast: Secondary | ICD-10-CM | POA: Diagnosis not present

## 2013-09-29 LAB — HM MAMMOGRAPHY

## 2013-10-01 ENCOUNTER — Telehealth: Payer: Self-pay | Admitting: Family Medicine

## 2013-10-01 NOTE — Telephone Encounter (Signed)
Pt's DM not at goal. She has an appt coming up to see Dr. Tamala Julian.

## 2013-10-05 ENCOUNTER — Encounter: Payer: Medicare Other | Admitting: Family Medicine

## 2013-10-06 ENCOUNTER — Telehealth: Payer: Self-pay | Admitting: *Deleted

## 2013-10-06 NOTE — Telephone Encounter (Signed)
Patient called.  She has been having ongoing problems with joint pain since February 2015.  She started on Armidex 12-17-12 for her ER/PR positive breast cancer.  She saw Dr. Humphrey Rolls in January 2015.  She is having problems with her right hand, left hips, knee and toes.   She was to see Dr. Humphrey Rolls in July, but since her departure she is now re-scheduled to see Dr. Lindi Adie in October 2015.  She is afraid to try a trial stoppage of the arimidex as it has helped tremendously with her dysfunctional uterine bleeding.  Will direct her question to one of the physicians in the [facility] as Dr. Lindi Adie is not here yet. Spoke with Marlon Pel RN for Dr. Jana Hakim.   Patient is to increase fluids and take Aleve twice a day for a week and then call Val back and let her know how she is doing.  May get her in to see MD at that point if needed.   Called patient back and she is fine with this.  She already drinks plenty of fluid (at least 64oz of water daily) so she will add the Aleve.  She is going out of town but will call Val on the 31st.  She is also seeing a chiropractor for her back and it is helping some and she will continue with this.

## 2013-10-21 ENCOUNTER — Ambulatory Visit: Payer: Medicare Other | Admitting: Nurse Practitioner

## 2013-10-27 ENCOUNTER — Encounter: Payer: Self-pay | Admitting: Adult Health

## 2013-10-27 ENCOUNTER — Ambulatory Visit (INDEPENDENT_AMBULATORY_CARE_PROVIDER_SITE_OTHER): Payer: Medicare Other | Admitting: Adult Health

## 2013-10-27 ENCOUNTER — Encounter: Payer: Self-pay | Admitting: Neurology

## 2013-10-27 VITALS — BP 146/78 | HR 79 | Ht 62.0 in | Wt 197.0 lb

## 2013-10-27 DIAGNOSIS — G4733 Obstructive sleep apnea (adult) (pediatric): Secondary | ICD-10-CM

## 2013-10-27 DIAGNOSIS — Z9989 Dependence on other enabling machines and devices: Principal | ICD-10-CM

## 2013-10-27 NOTE — Progress Notes (Signed)
PATIENT: [name] DOB: October 25, 1945  REASON FOR VISIT: follow up HISTORY FROM: patient  HISTORY OF PRESENT ILLNESS: [name] is 68 year old female with history of obstructive sleep Apnea on CPAP. She returns today for a 90 day compliance download. She brought her machine with her today and her reports shows an AHI of 2.2 on auto-titration at 6-12 cm of water. She uses her machine for  an average of 6 hours and 22 minutes a night, with 97%compliance. His Epworth score is 4 points was previously 3 . His fatigue severity score is 8 was previously 16.  Patient reports that she gets about 6.5  hours of sleep a night. She goes to bed around 11:30 pm and arises between 6:30-7:30am. She denies having trouble falling asleep or staying a sleep. States that she normally does not have to get up at night to urinate. Overall patient feels that CPAP has improved his sleepiness and fatigue. Since the last visit the Patient was diagnosed with breast cancer in June 2014. She is on an estrogen blocker now which has caused her to gain weight. She states she is now cancer free.   REVIEW OF SYSTEMS: Full 14 system review of systems performed and notable only for:  Constitutional: Activity change, unexpected weight change, excessive sweating Eyes: Eye itching, eye redness Ear/Nose/Throat: Ear discharge, runny nose Skin: N/A  Cardiovascular: Leg swelling Respiratory: N/A  Gastrointestinal: Diarrhea Genitourinary: N/A Hematology/Lymphatic: N/A  Endocrine: N/A Musculoskeletal: Joint pain Allergy/Immunology: Environmental allergies Neurological: N/A Psychiatric: N/A Sleep: N/A   ALLERGIES: Allergies  Allergen Reactions  . Latex Itching and Other (See Comments)    Causes blisters  . Adhesive [Tape] Rash    HOME MEDICATIONS: Outpatient Prescriptions Prior to Visit  Medication Sig Dispense Refill  . anastrozole (ARIMIDEX) 1 MG tablet Take 1 mg by mouth daily.       Marland Kitchen aspirin 81 MG tablet Take  81 mg by mouth daily.      . calcium citrate-vitamin D (CITRACAL+D) 315-200 MG-UNIT per tablet Take 1 tablet by mouth 2 (two) times daily. 600 mg. Calcium, 400 mg. Vit. D3      . Cyanocobalamin (VITAMIN B-12 CR PO) Take 500 mcg by mouth daily.       . fluticasone (FLONASE) 50 MCG/ACT nasal spray Place 2 sprays into both nostrils daily. As needed  16 g  11  . lisinopril-hydrochlorothiazide (PRINZIDE,ZESTORETIC) 10-12.5 MG per tablet Take 0.5 tablets by mouth daily.  90 tablet  1  . Loratadine-Pseudoephedrine (PX ALLERGY RELIEF D, LORATID, PO) Take by mouth as needed.      . lovastatin (MEVACOR) 20 MG tablet Take 1 tablet (20 mg total) by mouth at bedtime.  90 tablet  3  . Omega-3 Fatty Acids (FISH OIL) 1000 MG CAPS Take 1 capsule by mouth daily.      . vitamin C (ASCORBIC ACID) 500 MG tablet Take 1,000 mg by mouth daily.       . Zinc 50 MG TABS Take 1 tablet by mouth daily.      Marland Kitchen acetaminophen-codeine (TYLENOL #3) 300-30 MG per tablet Take 1 tablet by mouth every 4 (four) hours as needed for moderate pain (take one or two tablets as needed for pain as needed for post op pain).  30 tablet  0   No facility-administered medications prior to visit.    PAST MEDICAL HISTORY: Past Medical History  Diagnosis Date  . Postmenopausal bleeding   . Fibroids   . Headache(784.0)  tension  . Seasonal allergies   . Anemia     due to vaginal bleeding  . Urinary urgency   . Hypertension     under control with med., has been on med. x 3 yr.  . White coat hypertension   . History of endometriosis   . Breast cancer 08/2012    left  . Sleep apnea     uses CPAP nightly  . Arthritis     hands  . Dental crowns present   . Hyperlipidemia   . OSA (obstructive sleep apnea)   . S/P radiation therapy 10/20/2012-12/05/2012    left breast 50.4 gray, lumpectomy cavity boosted to 62.4 gray    PAST SURGICAL HISTORY: Past Surgical History  Procedure Laterality Date  . Hysteroscopy w/d&c  05/06/2009  .  Exploratory laparotomy  06-30-2007    ATTEMPTED HYSTERECTOMY ABORTED DUE TO EXTENSIVE ENDOMETRIOSIS W/ DENSE PELVIC ADHESIONS INVOLVING UTERUS AND LOWER RECTOSIGMOID  . Cholecystectomy  1995  . Colonoscopy w/ polypectomy  03/20/2007  . Dilation and curettage of uterus  2005  . Dilation and curettage of uterus  06/22/2011    Procedure: DILATATION AND CURETTAGE;  Surgeon: Alvino Chapel, MD;  Location: [facility];  Service: Gynecology;  Laterality: N/A;  OK PER KEELA FOR 7:15 START  . Breast lumpectomy with needle localization and axillary sentinel lymph node bx Left 09/03/2012    Procedure: BREAST LUMPECTOMY WITH NEEDLE LOCALIZATION AND AXILLARY SENTINEL LYMPH NODE BX;  Surgeon: Edward Jolly, MD;  Location: [facility] [facility];  Service: General;  Laterality: Left;  . Back surgery  1997    lumbar  . Dorsal compartment release Right 05/19/2013    Procedure: RELEASE 1ST  DORSAL COMPARTMENT RIGHT (DEQUERVAIN);  Surgeon: Cammie Sickle., MD;  Location: [facility];  Service: Orthopedics;  Laterality: Right;    FAMILY HISTORY: Family History  Problem Relation Age of Onset  . Hypertension Other   . Vision loss Mother   . Hypertension Mother   . Anesthesia problems Mother     post-op N/V  . Heart disease Mother   . Osteoporosis Mother   . Depression Father   . Heart disease Sister   . Hypertension Sister   . Diabetes Brother   . Hypertension Brother   . Cancer Brother     skin  . Hyperlipidemia Brother   . COPD Daughter   . Cancer Cousin   . Kidney failure Maternal Grandmother   . Heart disease Maternal Grandfather   . Colon cancer Neg Hx     SOCIAL HISTORY: History   Social History  . Marital Status: Married    Spouse Name: [name]    Number of Children: 4  . Years of Education: College   Occupational History  . retired    Social History Main Topics  . Smoking status: Never Smoker   . Smokeless tobacco: Never Used  .  Alcohol Use: No  . Drug Use: No  . Sexual Activity: Yes     Comment: menarche 59, P4, Premarin for short while, Provera x 3 years   Other Topics Concern  . Not on file   Social History Narrative   Marital status: married x 28 years; happily married; no abuse.      Children: 4 children; six grandchildren.      Lives:  With husband.      Employed: retired in 2011; [employer].      Tobacco: none  Alcohol:  None      Drugs: none      Exercise:  Daily. Walking x 1 hour three days per week.      Seatbelt:  100%      Sunscreen:  Face SPF 15.      Guns:  Loaded mostly secured.      ADLs:  Drives; no assistant devices; independent with all ADLs.       Her nocturia is improved under CPAP use at 10 cm water- AHI is now 0.8 and from 30 at baseline. CMS compliance  6 hours 40 minutes - sleeps on the side, 08-10-11 .FFM - likes it.    . Flonase used prn.     Caffeine Use: 1 cup daily      PHYSICAL EXAM  Filed Vitals:   10/27/13 1430  BP: 146/78  Pulse: 79  Height: 5\' 2"  (1.575 m)  Weight: 197 lb (89.359 kg)   Body mass index is 36.02 kg/(m^2).  Generalized: Well developed, in no acute distress  Neck: Circumference 15.5 inches, Mallampati 1+  Neurological examination  Mentation: Alert oriented to time, place, history taking. Follows all commands speech and language fluent Cranial nerve II-XII: Pupils were equal round reactive to light. Extraocular movements were full, visual field were full on confrontational test. Facial sensation and strength were normal. hearing was intact to finger rubbing bilaterally. Uvula tongue midline. Head turning and shoulder shrug  were normal and symmetric. Motor: The motor testing reveals 5 over 5 strength of all 4 extremities. Good symmetric motor tone is noted throughout.  Sensory: Sensory testing is intact to soft touch on all 4 extremities. No evidence of extinction is noted.  Gait and station: Gait is normal. Tandem gait is  normal. Romberg is negative. No drift is seen.  Reflexes: Deep tendon reflexes are symmetric and normal bilaterally.     DIAGNOSTIC DATA (LABS, IMAGING, TESTING) - I reviewed patient records, labs, notes, testing and imaging myself where available.  Lab Results  Component Value Date   WBC 5.8 03/26/2013   HGB 13.0 05/19/2013   HCT 40.6 03/26/2013   MCV 87.8 03/26/2013   PLT 239 03/26/2013      Component Value Date/Time   NA 138 05/13/2013 1430   NA 141 03/26/2013 1011   K 4.4 05/13/2013 1430   K 4.1 03/26/2013 1011   CL 99 05/13/2013 1430   CL 104 08/27/2012 1219   CO2 28 05/13/2013 1430   CO2 27 03/26/2013 1011   GLUCOSE 111* 05/13/2013 1430   GLUCOSE 173* 03/26/2013 1011   GLUCOSE 142* 08/27/2012 1219   BUN 13 05/13/2013 1430   BUN 15.0 03/26/2013 1011   CREATININE 0.72 05/13/2013 1430   CREATININE 0.8 03/26/2013 1011   CREATININE 0.64 03/23/2013 0822   CALCIUM 11.1* 05/13/2013 1430   CALCIUM 10.1 03/26/2013 1011   PROT 7.2 03/26/2013 1011   PROT 6.9 03/23/2013 0822   ALBUMIN 3.7 03/26/2013 1011   ALBUMIN 4.1 03/23/2013 0822   AST 15 03/26/2013 1011   AST 17 03/23/2013 0822   ALT 13 03/26/2013 1011   ALT 13 03/23/2013 0822   ALKPHOS 112 03/26/2013 1011   ALKPHOS 98 03/23/2013 0822   BILITOT 0.57 03/26/2013 1011   BILITOT 0.8 03/23/2013 0822   GFRNONAA 86* 05/13/2013 1430   GFRNONAA >89 03/23/2013 0822   GFRAA >90 05/13/2013 1430   GFRAA >89 03/23/2013 0822   Lab Results  Component Value Date   CHOL 212* 03/23/2013   HDL 48  03/23/2013   LDLCALC 146* 03/23/2013   TRIG 92 03/23/2013   CHOLHDL 4.4 03/23/2013   Lab Results  Component Value Date   HGBA1C 6.1* 03/23/2013    Lab Results  Component Value Date   TSH 1.283 09/15/2012      ASSESSMENT AND PLAN 68 y.o. year old female  has a past medical history of Postmenopausal bleeding; Fibroids; Headache(784.0); Seasonal allergies; Anemia; Urinary urgency; Hypertension; White coat hypertension; History of endometriosis; Breast cancer (08/2012); Sleep apnea; Arthritis; Dental  crowns present; Hyperlipidemia; OSA (obstructive sleep apnea); and S/P radiation therapy (10/20/2012-12/05/2012). here with:  1. OSA on CPAP   Patient is doing very well on CPAP. Her compliance download is excellent. Patient should continue using CPAP nightly. Patient and her husband inquiring about a CPAP machine that may have a backup battery pack. They state that they have been traveling a lot and have ran into issues where an electrical outlet was not near the bed. I advised them  to check with their DME provider to see if this is an option for them if so I will be happy to write a prescription for a new machine.   Ward Givens, MSN, NP-C 10/27/2013, 2:41 PM [facility] [address], [place],  [address] [phone]  Note: This document was prepared with digital dictation and possible smart phrase technology. Any transcriptional errors that result from this process are unintentional.

## 2013-10-27 NOTE — Patient Instructions (Signed)
Sleep Apnea  Sleep apnea is a sleep disorder characterized by abnormal pauses in breathing while you sleep. When your breathing pauses, the level of oxygen in your blood decreases. This causes you to move out of deep sleep and into light sleep. As a result, your quality of sleep is poor, and the system that carries your blood throughout your body (cardiovascular system) experiences stress. If sleep apnea remains untreated, the following conditions can develop:  High blood pressure (hypertension).  Coronary artery disease.  Inability to achieve or maintain an erection (impotence).  Impairment of your thought process (cognitive dysfunction). There are three types of sleep apnea: 1. Obstructive sleep apnea--Pauses in breathing during sleep because of a blocked airway. 2. Central sleep apnea--Pauses in breathing during sleep because the area of the brain that controls your breathing does not send the correct signals to the muscles that control breathing. 3. Mixed sleep apnea--A combination of both obstructive and central sleep apnea. RISK FACTORS The following risk factors can increase your risk of developing sleep apnea:  Being overweight.  Smoking.  Having narrow passages in your nose and throat.  Being of older age.  Being female.  Alcohol use.  Sedative and tranquilizer use.  Ethnicity. Among individuals younger than 35 years, African Americans are at increased risk of sleep apnea. SYMPTOMS   Difficulty staying asleep.  Daytime sleepiness and fatigue.  Loss of energy.  Irritability.  Loud, heavy snoring.  Morning headaches.  Trouble concentrating.  Forgetfulness.  Decreased interest in sex. DIAGNOSIS  In order to diagnose sleep apnea, your caregiver will perform a physical examination. Your caregiver may suggest that you take a home sleep test. Your caregiver may also recommend that you spend the night in a sleep lab. In the sleep lab, several monitors record  information about your heart, lungs, and brain while you sleep. Your leg and arm movements and blood oxygen level are also recorded. TREATMENT The following actions may help to resolve mild sleep apnea:  Sleeping on your side.   Using a decongestant if you have nasal congestion.   Avoiding the use of depressants, including alcohol, sedatives, and narcotics.   Losing weight and modifying your diet if you are overweight. There also are devices and treatments to help open your airway:  Oral appliances. These are custom-made mouthpieces that shift your lower jaw forward and slightly open your bite. This opens your airway.  Devices that create positive airway pressure. This positive pressure "splints" your airway open to help you breathe better during sleep. The following devices create positive airway pressure:  Continuous positive airway pressure (CPAP) device. The CPAP device creates a continuous level of air pressure with an air pump. The air is delivered to your airway through a mask while you sleep. This continuous pressure keeps your airway open.  Nasal expiratory positive airway pressure (EPAP) device. The EPAP device creates positive air pressure as you exhale. The device consists of single-use valves, which are inserted into each nostril and held in place by adhesive. The valves create very little resistance when you inhale but create much more resistance when you exhale. That increased resistance creates the positive airway pressure. This positive pressure while you exhale keeps your airway open, making it easier to breath when you inhale again.  Bilevel positive airway pressure (BPAP) device. The BPAP device is used mainly in patients with central sleep apnea. This device is similar to the CPAP device because it also uses an air pump to deliver continuous air pressure   through a mask. However, with the BPAP machine, the pressure is set at two different levels. The pressure when you  exhale is lower than the pressure when you inhale.  Surgery. Typically, surgery is only done if you cannot comply with less invasive treatments or if the less invasive treatments do not improve your condition. Surgery involves removing excess tissue in your airway to create a wider passage way. Document Released: 02/23/2002 Document Revised: 06/30/2012 Document Reviewed: 07/12/2011 Exit[employer] Patient Information 2015 [employer]. This information is not intended to replace advice given to you by your health care provider. Make sure you discuss any questions you have with your health care provider.  

## 2013-10-28 NOTE — Progress Notes (Signed)
I agree with the assessment and plan as directed by NP .The patient is known to me .   Rhianon Zabawa, MD  

## 2013-11-03 ENCOUNTER — Telehealth: Payer: Self-pay

## 2013-11-03 NOTE — Telephone Encounter (Signed)
Pt called to inform Dr. Tamala Julian that a fax is going to be coming in; it is a prescription for some free diabetic machines that she is in need of. Would like foe her to be on the look out, and to have a nurse call her if this can be approved.

## 2013-11-04 NOTE — Telephone Encounter (Signed)
Checked Dr. Thompson Caul box- no form was located.  Spoke to [name]- gave her both fax numbers to have forms sent to [place].

## 2013-11-11 ENCOUNTER — Encounter: Payer: Medicare Other | Admitting: Family Medicine

## 2013-11-12 ENCOUNTER — Encounter: Payer: Self-pay | Admitting: Gastroenterology

## 2013-11-13 ENCOUNTER — Encounter: Payer: Self-pay | Admitting: Radiology

## 2013-11-16 ENCOUNTER — Encounter: Payer: Self-pay | Admitting: Family Medicine

## 2013-11-16 ENCOUNTER — Ambulatory Visit (INDEPENDENT_AMBULATORY_CARE_PROVIDER_SITE_OTHER): Payer: Medicare Other | Admitting: Family Medicine

## 2013-11-16 VITALS — BP 130/66 | HR 77 | Temp 97.9°F | Resp 16 | Ht 61.5 in | Wt 194.2 lb

## 2013-11-16 DIAGNOSIS — M2550 Pain in unspecified joint: Secondary | ICD-10-CM

## 2013-11-16 DIAGNOSIS — B3731 Acute candidiasis of vulva and vagina: Secondary | ICD-10-CM

## 2013-11-16 DIAGNOSIS — Z0000 Encounter for general adult medical examination without abnormal findings: Secondary | ICD-10-CM | POA: Diagnosis not present

## 2013-11-16 DIAGNOSIS — I10 Essential (primary) hypertension: Secondary | ICD-10-CM

## 2013-11-16 DIAGNOSIS — Z01419 Encounter for gynecological examination (general) (routine) without abnormal findings: Secondary | ICD-10-CM | POA: Diagnosis not present

## 2013-11-16 DIAGNOSIS — G4733 Obstructive sleep apnea (adult) (pediatric): Secondary | ICD-10-CM

## 2013-11-16 DIAGNOSIS — Z9989 Dependence on other enabling machines and devices: Secondary | ICD-10-CM

## 2013-11-16 DIAGNOSIS — E119 Type 2 diabetes mellitus without complications: Secondary | ICD-10-CM | POA: Diagnosis not present

## 2013-11-16 DIAGNOSIS — B373 Candidiasis of vulva and vagina: Secondary | ICD-10-CM

## 2013-11-16 DIAGNOSIS — C50119 Malignant neoplasm of central portion of unspecified female breast: Secondary | ICD-10-CM

## 2013-11-16 DIAGNOSIS — C50112 Malignant neoplasm of central portion of left female breast: Secondary | ICD-10-CM

## 2013-11-16 DIAGNOSIS — Z23 Encounter for immunization: Secondary | ICD-10-CM

## 2013-11-16 DIAGNOSIS — E7800 Pure hypercholesterolemia, unspecified: Secondary | ICD-10-CM | POA: Diagnosis not present

## 2013-11-16 LAB — CBC WITH DIFFERENTIAL/PLATELET
Basophils Absolute: 0 10*3/uL (ref 0.0–0.1)
Basophils Relative: 0 % (ref 0–1)
Eosinophils Absolute: 0.4 10*3/uL (ref 0.0–0.7)
Eosinophils Relative: 7 % — ABNORMAL HIGH (ref 0–5)
HEMATOCRIT: 39.6 % (ref 36.0–46.0)
Hemoglobin: 13.8 g/dL (ref 12.0–15.0)
Lymphocytes Relative: 22 % (ref 12–46)
Lymphs Abs: 1.4 10*3/uL (ref 0.7–4.0)
MCH: 29.4 pg (ref 26.0–34.0)
MCHC: 34.8 g/dL (ref 30.0–36.0)
MCV: 84.3 fL (ref 78.0–100.0)
MONO ABS: 0.4 10*3/uL (ref 0.1–1.0)
MONOS PCT: 7 % (ref 3–12)
NEUTROS ABS: 4.1 10*3/uL (ref 1.7–7.7)
Neutrophils Relative %: 64 % (ref 43–77)
Platelets: 239 10*3/uL (ref 150–400)
RBC: 4.7 MIL/uL (ref 3.87–5.11)
RDW: 13.5 % (ref 11.5–15.5)
WBC: 6.4 10*3/uL (ref 4.0–10.5)

## 2013-11-16 LAB — POCT URINALYSIS DIPSTICK
Bilirubin, UA: NEGATIVE
GLUCOSE UA: NEGATIVE
Ketones, UA: 40
Leukocytes, UA: NEGATIVE
NITRITE UA: NEGATIVE
PROTEIN UA: NEGATIVE
UROBILINOGEN UA: 0.2
pH, UA: 5.5

## 2013-11-16 LAB — COMPLETE METABOLIC PANEL WITH GFR
ALBUMIN: 4.5 g/dL (ref 3.5–5.2)
ALK PHOS: 115 U/L (ref 39–117)
ALT: 17 U/L (ref 0–35)
AST: 21 U/L (ref 0–37)
BUN: 13 mg/dL (ref 6–23)
CO2: 26 meq/L (ref 19–32)
Calcium: 9.8 mg/dL (ref 8.4–10.5)
Chloride: 101 mEq/L (ref 96–112)
Creat: 0.61 mg/dL (ref 0.50–1.10)
GFR, Est African American: >89 mL/min
GFR, Est Non African American: >89 mL/min
Glucose, Bld: 77 mg/dL (ref 70–99)
POTASSIUM: 4.1 meq/L (ref 3.5–5.3)
SODIUM: 138 meq/L (ref 135–145)
TOTAL PROTEIN: 7.1 g/dL (ref 6.0–8.3)
Total Bilirubin: 1.1 mg/dL (ref 0.2–1.2)

## 2013-11-16 LAB — LIPID PANEL
Cholesterol: 210 mg/dL — ABNORMAL HIGH (ref 0–200)
HDL: 50 mg/dL (ref >39)
LDL Cholesterol: 148 mg/dL — ABNORMAL HIGH (ref 0–99)
TRIGLYCERIDES: 61 mg/dL (ref <150)
Total CHOL/HDL Ratio: 4.2 Ratio
VLDL: 12 mg/dL (ref 0–40)

## 2013-11-16 LAB — HEMOGLOBIN A1C
Hgb A1c MFr Bld: 5.9 % — ABNORMAL HIGH (ref <5.7)
Mean Plasma Glucose: 123 mg/dL — ABNORMAL HIGH (ref <117)

## 2013-11-16 MED ORDER — TERCONAZOLE 0.4 % VA CREA: 1.0000 | TOPICAL_CREAM | Freq: Two times a day (BID) | VAGINAL | Status: DC

## 2013-11-16 MED ORDER — MELOXICAM 15 MG PO TABS: 15.0000 mg | ORAL_TABLET | Freq: Every day | ORAL | Status: DC | PRN

## 2013-11-16 NOTE — Progress Notes (Signed)
   Subjective:    Patient ID: [name], female    DOB: 1945-12-07, 68 y.o.   MRN: [id number]  HPI    Review of Systems  Constitutional: Negative.   HENT: Negative.   Eyes: Negative.   Respiratory: Negative.   Cardiovascular: Negative.   Gastrointestinal: Negative.   Endocrine: Negative.   Genitourinary: Negative.   Musculoskeletal: Positive for arthralgias, back pain and myalgias.  Skin: Negative.   Allergic/Immunologic: Positive for environmental allergies.  Neurological: Negative.   Hematological: Negative.   Psychiatric/Behavioral: Negative.        Objective:   Physical Exam        Assessment & Plan:

## 2013-11-16 NOTE — Patient Instructions (Signed)

## 2013-11-16 NOTE — Progress Notes (Addendum)
Subjective:  This chart was scribed for Diane Honour, MD by [name], ED Scribe. The patient was seen in room 22. Patient's care was started at 1:51 PM.   Patient ID: [name], female    DOB: 14-Jun-1945, 68 y.o.   MRN: [id number]  11/16/2013  Annual Exam, Hyperlipidemia, Hypertension and Diabetes  HPI HPI Comments: [name] is a 68 y.o. female who presents to the [facility] for an annual exam and follow-up for HTN, hyperlipidemia and DM. Tdap 2007. Pneumovax 2011. Zoster vaccine 2011. Flu vaccine 2014. Last CPE 09/15/12. Bone Density 08/2012. Colonoscopy 07/28/13. Mammogram 09/29/13. Last pap smear 02/2011; followed by Gyn Onc for postmenopausal bleeding.   Pt states that she was placed on an estrogen blocker which stopped the postmenopausal bleeding. Dr. Fermin Schwab, GYN-ONC suggested that pt can be followed up every 3 years now for pap smears; followed by Dr. Lowella Dell of gynecology as well. Eye exam annually by Dr. Cena Benton. Pt is seeking a new dentist. Pt sees a dermatologist as needed. Pt checks her BP regularly with readings in 110s/50-60. Pt reports reading of 125/67 last night. Pt states that she only checks her glucose prior to office visits. Glucose this morning was 91.   Pt is scheduled to follow-up with oncologist in 12/2013. Pt follows up annually.  She is followed by general surgery/[place] yearly as well for breast cancer.  Pt has been married for 20 years with 4 children and 9 grandchildren. She denies tobacco and alcohol use. Pt walks and does chair exercises. No new health problems with her half sister; h/o abnormal HR. Pt's brother was diagnosed with DM; h/o skin CA.   Pt reports recent diarrhea that she attributes to starting Aleve 2-3 weeks ago and eating [employer]. Pt also reports body aches that she attributes to medication. She denies tinnitus, dizziness, mouth sores, chest pain, sob, palpitations, cough, neck pain, numbness/tingling,  constipation, abdominal pain, nausea, vomiting, blood in stool. Pt does not get up during the night to urinate. She reports a few episodes of urinary incontinence that she attributes to drinking more water.   Review of Systems  Constitutional: Negative for fever, chills, diaphoresis, activity change, appetite change, fatigue and unexpected weight change.  HENT: Negative for congestion, dental problem, drooling, ear discharge, ear pain, facial swelling, hearing loss, mouth sores, nosebleeds, postnasal drip, rhinorrhea, sinus pressure, sneezing, sore throat, tinnitus, trouble swallowing and voice change.   Eyes: Negative for photophobia, pain, discharge, redness, itching and visual disturbance.  Respiratory: Negative for apnea, cough, choking, chest tightness, shortness of breath, wheezing and stridor.   Cardiovascular: Negative for chest pain, palpitations and leg swelling.  Gastrointestinal: Positive for diarrhea. Negative for nausea, vomiting, abdominal pain, constipation, blood in stool, abdominal distention, anal bleeding and rectal pain.  Endocrine: Negative for cold intolerance, heat intolerance, polydipsia, polyphagia and polyuria.  Genitourinary: Negative for dysuria, urgency, frequency, hematuria, flank pain, decreased urine volume, vaginal bleeding, vaginal discharge, enuresis, difficulty urinating, genital sores, vaginal pain, menstrual problem, pelvic pain and dyspareunia.  Musculoskeletal: Positive for back pain and myalgias. Negative for arthralgias, gait problem, joint swelling, neck pain and neck stiffness.  Skin: Negative for color change, pallor, rash and wound.  Allergic/Immunologic: Negative for environmental allergies, food allergies and immunocompromised state.  Neurological: Negative for dizziness, tremors, seizures, syncope, facial asymmetry, speech difficulty, weakness, light-headedness, numbness and headaches.  Hematological: Negative for adenopathy. Does not bruise/bleed  easily.  Psychiatric/Behavioral: Negative for suicidal ideas, hallucinations, behavioral problems, confusion,  sleep disturbance, self-injury, dysphoric mood, decreased concentration and agitation. The patient is not nervous/anxious and is not hyperactive.    Past Medical History  Diagnosis Date  . Postmenopausal bleeding   . Fibroids   . Headache(784.0)     tension  . Seasonal allergies   . Anemia     due to vaginal bleeding  . Urinary urgency   . Hypertension     under control with med., has been on med. x 3 yr.  . White coat hypertension   . History of endometriosis   . Breast cancer 08/2012    left  . Sleep apnea     uses CPAP nightly  . Arthritis     hands  . Dental crowns present   . Hyperlipidemia   . OSA (obstructive sleep apnea)   . S/P radiation therapy 10/20/2012-12/05/2012    left breast 50.4 gray, lumpectomy cavity boosted to 62.4 gray  . Allergy    Past Surgical History  Procedure Laterality Date  . Hysteroscopy w/d&c  05/06/2009  . Exploratory laparotomy  06-30-2007    ATTEMPTED HYSTERECTOMY ABORTED DUE TO EXTENSIVE ENDOMETRIOSIS W/ DENSE PELVIC ADHESIONS INVOLVING UTERUS AND LOWER RECTOSIGMOID  . Cholecystectomy  1995  . Colonoscopy w/ polypectomy  03/20/2007  . Dilation and curettage of uterus  2005  . Dilation and curettage of uterus  06/22/2011    Procedure: DILATATION AND CURETTAGE;  Surgeon: Alvino Chapel, MD;  Location: [facility];  Service: Gynecology;  Laterality: N/A;  OK PER KEELA FOR 7:15 START  . Breast lumpectomy with needle localization and axillary sentinel lymph node bx Left 09/03/2012    Procedure: BREAST LUMPECTOMY WITH NEEDLE LOCALIZATION AND AXILLARY SENTINEL LYMPH NODE BX;  Surgeon: Edward Jolly, MD;  Location: [facility];  Service: General;  Laterality: Left;  . Back surgery  1997    lumbar  . Dorsal compartment release Right 05/19/2013    Procedure: RELEASE 1ST  DORSAL COMPARTMENT RIGHT  (DEQUERVAIN);  Surgeon: Cammie Sickle., MD;  Location: [facility];  Service: Orthopedics;  Laterality: Right;   Allergies  Allergen Reactions  . Latex Itching and Other (See Comments)    Causes blisters  . Adhesive [Tape] Rash   Current Outpatient Prescriptions  Medication Sig Dispense Refill  . anastrozole (ARIMIDEX) 1 MG tablet Take 1 mg by mouth daily.       Marland Kitchen aspirin 81 MG tablet Take 81 mg by mouth daily.      . calcium citrate-vitamin D (CITRACAL+D) 315-200 MG-UNIT per tablet Take 1 tablet by mouth 2 (two) times daily. 600 mg. Calcium, 400 mg. Vit. D3      . Cyanocobalamin (VITAMIN B-12 CR PO) Take 500 mcg by mouth daily.       . fluticasone (FLONASE) 50 MCG/ACT nasal spray Place 2 sprays into both nostrils daily. As needed  16 g  11  . lisinopril-hydrochlorothiazide (PRINZIDE,ZESTORETIC) 10-12.5 MG per tablet Take 0.5 tablets by mouth daily.  90 tablet  1  . Loratadine-Pseudoephedrine (PX ALLERGY RELIEF D, LORATID, PO) Take by mouth as needed.      . Naproxen Sodium (ALEVE) 220 MG CAPS Take 220 mg by mouth 2 (two) times daily.      . Omega-3 Fatty Acids (FISH OIL) 1000 MG CAPS Take 1 capsule by mouth daily.      . vitamin C (ASCORBIC ACID) 500 MG tablet Take 1,000 mg by mouth daily.       Marland Kitchen Zinc  50 MG TABS Take 1 tablet by mouth daily.      Marland Kitchen lovastatin (MEVACOR) 20 MG tablet Take 1 tablet (20 mg total) by mouth at bedtime.  90 tablet  3  . meloxicam (MOBIC) 15 MG tablet Take 1 tablet (15 mg total) by mouth daily as needed for pain.  30 tablet  5  . terconazole (TERAZOL 7) 0.4 % vaginal cream Place 1 applicator vaginally 2 (two) times daily.  45 g  0  . [DISCONTINUED] LISINOPRIL PO Take 1 tablet by mouth daily.       No current facility-administered medications for this visit.   History   Social History  . Marital Status: Married    Spouse Name: [name]    Number of Children: 4  . Years of Education: College   Occupational History  . retired    Social  History Main Topics  . Smoking status: Never Smoker   . Smokeless tobacco: Never Used  . Alcohol Use: No  . Drug Use: No  . Sexual Activity: Yes     Comment: menarche 69, P4, Premarin for short while, Provera x 3 years   Other Topics Concern  . Not on file   Social History Narrative   Marital status: married x 62 years; happily married; no abuse.      Children: 4 children; six grandchildren; no gg.      Lives:  With husband.      Employed: retired in 2011; [employer].      Tobacco: none       Alcohol:  None      Drugs: none      Exercise:  Daily. Walking x 1 hour three days per week.      Seatbelt:  100%      Sunscreen:  Face SPF 15.      Guns:  Loaded mostly secured.      ADLs:  Drives; no assistant devices; independent with all ADLs.       Her nocturia is improved under CPAP use at 10 cm water- AHI is now 0.8 and from 30 at baseline. CMS compliance  6 hours 40 minutes - sleeps on the side, 08-10-11 .FFM - likes it.    . Flonase used prn.     Caffeine Use: 1 cup daily   Family History  Problem Relation Age of Onset  . Hypertension Other   . Vision loss Mother   . Hypertension Mother   . Anesthesia problems Mother     post-op N/V  . Heart disease Mother   . Osteoporosis Mother   . Depression Father   . Heart disease Sister   . Hypertension Sister   . Hyperlipidemia Sister   . Diabetes Brother   . Hypertension Brother   . Cancer Brother     skin  . Hyperlipidemia Brother   . COPD Daughter   . Cancer Cousin   . Kidney failure Maternal Grandmother   . Heart disease Maternal Grandfather   . Colon cancer Neg Hx        Objective:    Triage Vitals: BP 130/66  Pulse 77  Temp(Src) 97.9 F (36.6 C) (Oral)  Resp 16  Ht 5' 1.5" (1.562 m)  Wt 194 lb 3.2 oz (88.089 kg)  BMI 36.10 kg/m2  SpO2 100% Physical Exam  Nursing note and vitals reviewed. Constitutional: She is oriented to person, place, and time. She appears well-developed and  well-nourished. No distress.  HENT:  Head: Normocephalic  and atraumatic.  Right Ear: External ear normal.  Left Ear: External ear normal.  Nose: Nose normal.  Mouth/Throat: Oropharynx is clear and moist.  Eyes: Conjunctivae and EOM are normal. Pupils are equal, round, and reactive to light.  Neck: Normal range of motion and full passive range of motion without pain. Neck supple. No JVD present. Carotid bruit is not present. No thyromegaly present.  Cardiovascular: Normal rate, regular rhythm and normal heart sounds.  Exam reveals no gallop and no friction rub.   No murmur heard. Pulmonary/Chest: Effort normal and breath sounds normal. She has no wheezes. She has no rales. Right breast exhibits no inverted nipple, no mass, no nipple discharge, no skin change and no tenderness. Left breast exhibits no inverted nipple, no mass, no nipple discharge, no skin change and no tenderness.  Abdominal: Soft. Bowel sounds are normal. She exhibits no distension and no mass. There is no tenderness. There is no rebound and no guarding.  Genitourinary: Uterus normal.    There is rash on the right labia. There is no tenderness or lesion on the right labia. There is rash on the left labia. There is no tenderness or lesion on the left labia. Cervix exhibits no motion tenderness and no discharge. Right adnexum displays no mass, no tenderness and no fullness. Left adnexum displays no mass, no tenderness and no fullness. There is erythema around the vagina. No tenderness or bleeding around the vagina.  Diffusely erythematous on the external genitalia  Uterus exam normal  Musculoskeletal: Normal range of motion.       Right shoulder: Normal.       Left shoulder: Normal.       Cervical back: Normal.  Lymphadenopathy:    She has no cervical adenopathy.  Neurological: She is alert and oriented to person, place, and time. She has normal reflexes. No cranial nerve deficit. She exhibits normal muscle tone. Coordination  normal.  Skin: Skin is warm and dry. No rash noted. She is not diaphoretic. No erythema. No pallor.  Diffuse sun related changes throughout.  Psychiatric: She has a normal mood and affect. Her behavior is normal. Judgment and thought content normal.   Results for orders placed in visit on 11/16/13  POCT URINALYSIS DIPSTICK      Result Value Ref Range   Color, UA yellow     Clarity, UA clear     Glucose, UA neg     Bilirubin, UA neg     Ketones, UA 40     Spec Grav, UA <=1.005     Blood, UA small     pH, UA 5.5     Protein, UA neg     Urobilinogen, UA 0.2     Nitrite, UA neg     Leukocytes, UA Negative     PREVNAR 13 ADMINISTERED IN OFFICE.    Assessment & Plan:   1. Routine general medical examination at a health care facility   2. Type II or unspecified type diabetes mellitus without mention of complication, not stated as uncontrolled   3. Pure hypercholesterolemia   4. Essential hypertension, benign   5. Routine gynecological examination   6. Vulvovaginal candidiasis   7. Arthralgia   8. Obstructive sleep apnea on CPAP   9. Cancer of central portion of female breast, left   10. Need for vaccination with 13-polyvalent pneumococcal conjugate vaccine    1. Annual Wellness Examination: anticipatory guidance.  Pap smear obtained.  Mammogram and colonoscopy and bone density scan UTD.  S/p Prevnar 13.  Low fall risk.  Independent with ADLs. No evidence of depression.  No hearing loss.   2.  Gynecological exam: pap smear obtained; mammogram UTD. 3.  DMII: controlled; obtain labs including urine microalbumin.  Continue with dietary modification.  Eye exam UTD. 4.  HTN: controlled; obtain labs; continue current medications. 5.  Hyperlipidemia: uncontrolled; non-compliance with Lovastatin; recommend switching to a.m. Dosing. 6.  Vulvovaginal candidiasis:  New. Rx for Terazol provided. If no improvement with Terazol, refer to gynecology. 7.  Breast cancer L: stable; followed by  oncology and general surgery yearly; maintained on Arimidex for five years; mammogram UTD. 8. Arthralgias: New. Secondary to Arimidex therapy; rx for Meloxicam provided; suffering with GI symptoms with Aleve daily. 9.  OSA on CPAP: stable; managed by [facility].  10. S/p Prevnar 13.  Meds ordered this encounter  Medications  . terconazole (TERAZOL 7) 0.4 % vaginal cream    Sig: Place 1 applicator vaginally 2 (two) times daily.    Dispense:  45 g    Refill:  0  . meloxicam (MOBIC) 15 MG tablet    Sig: Take 1 tablet (15 mg total) by mouth daily as needed for pain.    Dispense:  30 tablet    Refill:  5    Return in about 6 months (around 05/17/2014) for recheck blood pressure, sugar, cholesterol.    I personally performed the services described in this documentation, which was scribed in my presence.  The recorded information has been reviewed and is accurate.  Reginia Forts, M.D.  [facility] [address] [place], [place]  [address] [phone] phone [phone] fax

## 2013-11-17 ENCOUNTER — Telehealth: Payer: Self-pay

## 2013-11-17 LAB — MICROALBUMIN, URINE: MICROALB UR: 0.5 mg/dL (ref 0.00–1.89)

## 2013-11-17 NOTE — Telephone Encounter (Signed)
Patient called. States she saw Dr. Tamala Julian yesterday and was prescribed a cream for external irritation. Patient states cream is $40 and she does not want to fill that at this time. No further follow up needed, patient is just calling to let us know.

## 2013-11-18 LAB — PAP IG (IMAGE GUIDED)

## 2013-11-20 MED ORDER — FLUCONAZOLE 100 MG PO TABS: ORAL_TABLET | ORAL | Status: DC

## 2013-11-20 NOTE — Telephone Encounter (Signed)
Pt advised.

## 2013-11-20 NOTE — Telephone Encounter (Signed)
Call --- I have sent in rx for Diflucan (yeast medication by mouth).  Let's see if this is more affordable for patient.

## 2013-12-16 ENCOUNTER — Other Ambulatory Visit: Payer: Self-pay

## 2013-12-16 DIAGNOSIS — C50119 Malignant neoplasm of central portion of unspecified female breast: Secondary | ICD-10-CM

## 2013-12-17 ENCOUNTER — Telehealth: Payer: Self-pay | Admitting: Hematology and Oncology

## 2013-12-17 ENCOUNTER — Other Ambulatory Visit (HOSPITAL_BASED_OUTPATIENT_CLINIC_OR_DEPARTMENT_OTHER): Payer: Medicare Other

## 2013-12-17 ENCOUNTER — Ambulatory Visit (HOSPITAL_BASED_OUTPATIENT_CLINIC_OR_DEPARTMENT_OTHER): Payer: Medicare Other | Admitting: Hematology and Oncology

## 2013-12-17 ENCOUNTER — Encounter: Payer: Self-pay | Admitting: Hematology and Oncology

## 2013-12-17 VITALS — BP 139/61 | HR 75 | Temp 97.9°F | Resp 18 | Ht 61.0 in | Wt 199.7 lb

## 2013-12-17 DIAGNOSIS — C50112 Malignant neoplasm of central portion of left female breast: Secondary | ICD-10-CM

## 2013-12-17 DIAGNOSIS — C50119 Malignant neoplasm of central portion of unspecified female breast: Secondary | ICD-10-CM

## 2013-12-17 LAB — CBC WITH DIFFERENTIAL/PLATELET
BASO%: 0.9 % (ref 0.0–2.0)
Basophils Absolute: 0.1 10*3/uL (ref 0.0–0.1)
EOS%: 7.4 % — ABNORMAL HIGH (ref 0.0–7.0)
Eosinophils Absolute: 0.4 10*3/uL (ref 0.0–0.5)
HEMATOCRIT: 39.7 % (ref 34.8–46.6)
HGB: 13.3 g/dL (ref 11.6–15.9)
LYMPH%: 21.1 % (ref 14.0–49.7)
MCH: 29.2 pg (ref 25.1–34.0)
MCHC: 33.6 g/dL (ref 31.5–36.0)
MCV: 86.9 fL (ref 79.5–101.0)
MONO#: 0.6 10*3/uL (ref 0.1–0.9)
MONO%: 9.3 % (ref 0.0–14.0)
NEUT%: 61.3 % (ref 38.4–76.8)
NEUTROS ABS: 3.7 10*3/uL (ref 1.5–6.5)
Platelets: 239 10*3/uL (ref 145–400)
RBC: 4.56 10*6/uL (ref 3.70–5.45)
RDW: 13.2 % (ref 11.2–14.5)
WBC: 6 10*3/uL (ref 3.9–10.3)
lymph#: 1.3 10*3/uL (ref 0.9–3.3)

## 2013-12-17 LAB — COMPREHENSIVE METABOLIC PANEL (CC13)
ALBUMIN: 3.9 g/dL (ref 3.5–5.0)
ALT: 18 U/L (ref 0–55)
AST: 20 U/L (ref 5–34)
Alkaline Phosphatase: 128 U/L (ref 40–150)
Anion Gap: 7 mEq/L (ref 3–11)
BUN: 14.1 mg/dL (ref 7.0–26.0)
CALCIUM: 10.5 mg/dL — AB (ref 8.4–10.4)
CHLORIDE: 105 meq/L (ref 98–109)
CO2: 28 meq/L (ref 22–29)
Creatinine: 0.8 mg/dL (ref 0.6–1.1)
Glucose: 113 mg/dl (ref 70–140)
Potassium: 4.4 mEq/L (ref 3.5–5.1)
SODIUM: 140 meq/L (ref 136–145)
TOTAL PROTEIN: 7.6 g/dL (ref 6.4–8.3)
Total Bilirubin: 0.93 mg/dL (ref 0.20–1.20)

## 2013-12-17 NOTE — Telephone Encounter (Signed)
per pof to sch pt appt-gave pt copy of sch °

## 2013-12-17 NOTE — Progress Notes (Signed)
Patient Care Team: [name], MD as PCP - General (Family Medicine) [name], MD (Family Medicine)  DIAGNOSIS: Cancer of central portion of left female breast   Primary site: Breast (Left)   Staging method: AJCC 7th Edition   Clinical: Stage IA (T1c, N0, cM0)   Summary: Stage IA (T1c, N0, cM0)   Clinical comments: Staged at breast conference 6.11.14   SUMMARY OF ONCOLOGIC HISTORY:   Cancer of central portion of left female breast   08/13/2012 Mammogram Questionable mass deep to the nipple superior periareolar region with calcifications.U/S 5 x 8 mm mass at 12:00, ovoid cystic lesion at 11:00 1.5X 0.4 cm   08/25/2012 Initial Diagnosis Invasive mammary cancer with mammary cancer in situ grade 2 with lobular features ER 100%, PR 63%, Ki-67 58%, HER-2 positive ratio 2.48   08/26/2012 Breast MRI Dominant, biopsied mass with small immediately adjacent satellite mass consistent with biopsy results indicating malignancy, measuring 17 x 11 x 56m   09/03/2012 Surgery Left breast lumpectomy invasive lobular cancer with lymphovascular invasion and lobular carcinoma in situ positive margins one SLN negative, grade 2; 1.6 cm, ER 100%, PR 63%, HER-2 positive, Ki-67 58% T1 C. N0 M0 stage IA   10/21/2012 - 11/28/2012 Radiation Therapy Radiation therapy to the lumpectomy site   12/01/2012 -  Anti-estrogen oral therapy Arimidex 1 mg daily planned duration of treatment 5 years    CHIEF COMPLIANT: Muscle aches and pains related to an aromatase inhibitor  INTERVAL HISTORY: Diane OCEGUERAis a 68year old Caucasian lady with above-mentioned history of left-sided breast cancer it was initially HER-2 positive but on the final lumpectomy it was found to be HER-2 negative. She had stage IA and she underwent radiation therapy and has been on oral antiestrogen therapy with Arimidex. She is tolerating Arimidex fairly well except for muscle aches and pains. She was given meloxicam and that seems to have improved her  symptoms. She denies any hot flashes. She is taking calcium and vitamin D.  Recently she has been traveling throughout the country. [place]. They are celebrating their 50th wedding anniversary next year by [place] a [place]   REVIEW OF SYSTEMS:   Constitutional: Denies fevers, chills or abnormal weight loss Eyes: Denies blurriness of vision Ears, nose, mouth, throat, and face: Denies mucositis or sore throat Respiratory: Denies cough, dyspnea or wheezes Cardiovascular: Denies palpitation, chest discomfort or lower extremity swelling Gastrointestinal:  Denies nausea, heartburn or change in bowel habits Skin: Denies abnormal skin rashes Lymphatics: Denies new lymphadenopathy or easy bruising Neurological:Denies numbness, tingling or new weaknesses Behavioral/Psych: Mood is stable, no new changes  Breast:  denies any pain or lumps or nodules in either breasts All other systems were reviewed with the patient and are negative.  I have reviewed the past medical history, past surgical history, social history and family history with the patient and they are unchanged from previous note.  ALLERGIES:  is allergic to latex and adhesive.  MEDICATIONS:  Current Outpatient Prescriptions  Medication Sig Dispense Refill  . anastrozole (ARIMIDEX) 1 MG tablet Take 1 mg by mouth daily.       .Marland Kitchenaspirin 81 MG tablet Take 81 mg by mouth daily.      . calcium citrate-vitamin D (CITRACAL+D) 315-200 MG-UNIT per tablet Take 1 tablet by mouth 2 (two) times daily. 600 mg. Calcium, 400 mg. Vit. D3      .  Cyanocobalamin (VITAMIN B-12 CR PO) Take 500 mcg by mouth daily.       . fluconazole (DIFLUCAN) 100 MG tablet Two tablets daily x 1 day then one tablet daily for six days  8 tablet  0  . fluticasone (FLONASE) 50 MCG/ACT nasal spray Place 2 sprays into both nostrils daily. As needed  16 g  11  . lisinopril-hydrochlorothiazide  (PRINZIDE,ZESTORETIC) 10-12.5 MG per tablet Take 0.5 tablets by mouth daily.  90 tablet  1  . Loratadine-Pseudoephedrine (PX ALLERGY RELIEF D, LORATID, PO) Take by mouth as needed.      . lovastatin (MEVACOR) 20 MG tablet Take 1 tablet (20 mg total) by mouth at bedtime.  90 tablet  3  . meloxicam (MOBIC) 15 MG tablet Take 1 tablet (15 mg total) by mouth daily as needed for pain.  30 tablet  5  . Naproxen Sodium (ALEVE) 220 MG CAPS Take 220 mg by mouth 2 (two) times daily.      . Omega-3 Fatty Acids (FISH OIL) 1000 MG CAPS Take 1 capsule by mouth daily.      Marland Kitchen terconazole (TERAZOL 7) 0.4 % vaginal cream Place 1 applicator vaginally 2 (two) times daily.  45 g  0  . vitamin C (ASCORBIC ACID) 500 MG tablet Take 1,000 mg by mouth daily.       . Zinc 50 MG TABS Take 1 tablet by mouth daily.      . [DISCONTINUED] LISINOPRIL PO Take 1 tablet by mouth daily.       No current facility-administered medications for this visit.    PHYSICAL EXAMINATION: ECOG PERFORMANCE STATUS: 1 - Symptomatic but completely ambulatory  Filed Vitals:   12/17/13 0956  BP: 139/61  Pulse: 75  Temp: 97.9 F (36.6 C)  Resp: 18   Filed Weights   12/17/13 0956  Weight: 199 lb 11.2 oz (90.583 kg)    GENERAL:alert, no distress and comfortable SKIN: skin color, texture, turgor are normal, no rashes or significant lesions EYES: normal, Conjunctiva are pink and non-injected, sclera clear OROPHARYNX:no exudate, no erythema and lips, buccal mucosa, and tongue normal  NECK: supple, thyroid normal size, non-tender, without nodularity LYMPH:  no palpable lymphadenopathy in the cervical, axillary or inguinal LUNGS: clear to auscultation and percussion with normal breathing effort HEART: regular rate & rhythm and no murmurs and no lower extremity edema ABDOMEN:abdomen soft, non-tender and normal bowel sounds Musculoskeletal:no cyanosis of digits and no clubbing  NEURO: alert & oriented x 3 with fluent speech, no focal  motor/sensory deficits BREAST: No palpable masses or nodules in either right or left breasts. No palpable axillary supraclavicular or infraclavicular adenopathy no breast tenderness or nipple discharge.   LABORATORY DATA:  I have reviewed the data as listed   Chemistry      Component Value Date/Time   NA 140 12/17/2013 0933   NA 138 11/16/2013 1352   K 4.4 12/17/2013 0933   K 4.1 11/16/2013 1352   CL 101 11/16/2013 1352   CL 104 08/27/2012 1219   CO2 28 12/17/2013 0933   CO2 26 11/16/2013 1352   BUN 14.1 12/17/2013 0933   BUN 13 11/16/2013 1352   CREATININE 0.8 12/17/2013 0933   CREATININE 0.61 11/16/2013 1352   CREATININE 0.72 05/13/2013 1430      Component Value Date/Time   CALCIUM 10.5* 12/17/2013 0933   CALCIUM 9.8 11/16/2013 1352   ALKPHOS 128 12/17/2013 0933   ALKPHOS 115 11/16/2013 1352   AST 20 12/17/2013 0933  AST 21 11/16/2013 1352   ALT 18 12/17/2013 0933   ALT 17 11/16/2013 1352   BILITOT 0.93 12/17/2013 0933   BILITOT 1.1 11/16/2013 1352       Lab Results  Component Value Date   WBC 6.0 12/17/2013   HGB 13.3 12/17/2013   HCT 39.7 12/17/2013   MCV 86.9 12/17/2013   PLT 239 12/17/2013   NEUTROABS 3.7 12/17/2013     RADIOGRAPHIC STUDIES: I have personally reviewed the radiology reports and agreed with their findings. No results found.   ASSESSMENT & PLAN:  Cancer of central portion of left female breast Left breast invasive lobular cancer ER/PR positive HER-2 negative (initial biopsy showed HER-2 positive but final pathology on the lumpectomy did not) status post left breast lumpectomy and radiation and currently on antiestrogen therapy with Arimidex started 12/01/2012.  Patient is tolerating Arimidex fairly well except for muscle aches and pains. She is taking meloxicam for pain. I discussed with her that occasionally Claritin-D may use some other myalgias related to antiestrogen therapy. She will try to take it and see if it works.  Hypercalcemia: She has had episodic  hypercalcemia. I instructed her to stop taking calcium and vitamin D at this point. I will check a PTH level on the next followup visit. Instructed her to call her family physician if she had any muscle cramping or any symptoms of hypocalcemia like severe constipation or mental status changes. Return to clinic in 6 months for followup.   Orders Placed This Encounter  Procedures  . CBC with Differential    Standing Status: Future     Number of Occurrences:      Standing Expiration Date: 12/17/2014  . Comprehensive metabolic panel (Cmet) - [facility]    Standing Status: Future     Number of Occurrences:      Standing Expiration Date: 12/17/2014  . PTH, intact and calcium    Standing Status: Future     Number of Occurrences:      Standing Expiration Date: 12/17/2014   The patient has a good understanding of the overall plan. she agrees with it. She will call with any problems that may develop before her next visit here.  I spent 20 minutes counseling the patient face to face. The total time spent in the appointment was 25 minutes and more than 50% was on counseling and review of test results    Rulon Eisenmenger, MD 12/17/2013 10:42 AM

## 2013-12-17 NOTE — Assessment & Plan Note (Signed)
Left breast invasive lobular cancer ER/PR positive HER-2 negative (initial biopsy showed HER-2 positive but final pathology on the lumpectomy did not) status post left breast lumpectomy and radiation and currently on antiestrogen therapy with Arimidex started 12/01/2012.  Patient is tolerating Arimidex fairly well except for muscle aches and pains. She is taking meloxicam for pain. I discussed with her that occasionally Claritin-D may use some other myalgias related to antiestrogen therapy. She will try to take it and see if it works.  Hypercalcemia: She has had episodic hypercalcemia. I instructed her to stop taking calcium and vitamin D at this point. I will check a PTH level on the next followup visit. Instructed her to call her family physician if she had any muscle cramping or any symptoms of hypocalcemia like severe constipation or mental status changes. Return to clinic in 6 months for followup.

## 2013-12-18 ENCOUNTER — Telehealth: Payer: Self-pay

## 2013-12-18 ENCOUNTER — Ambulatory Visit (INDEPENDENT_AMBULATORY_CARE_PROVIDER_SITE_OTHER): Payer: Medicare Other | Admitting: Family Medicine

## 2013-12-18 VITALS — BP 164/84 | HR 80 | Temp 97.4°F | Resp 16 | Ht 62.0 in | Wt 199.0 lb

## 2013-12-18 DIAGNOSIS — H60391 Other infective otitis externa, right ear: Secondary | ICD-10-CM

## 2013-12-18 DIAGNOSIS — H9201 Otalgia, right ear: Secondary | ICD-10-CM

## 2013-12-18 MED ORDER — OFLOXACIN 0.3 % OT SOLN: 10.0000 [drp] | Freq: Every day | OTIC | Status: DC

## 2013-12-18 MED ORDER — CIPROFLOXACIN-HYDROCORTISONE 0.2-1 % OT SUSP: 3.0000 [drp] | Freq: Two times a day (BID) | OTIC | Status: DC

## 2013-12-18 NOTE — Telephone Encounter (Signed)
i sent in cipro [facility] otic as this is other recommended option to see if this is cheaper.

## 2013-12-18 NOTE — Telephone Encounter (Signed)
Patient was seen today and the ear drops she was prescribed are too expensive. States they are over 200 dollars. Patient wants to know if there is something else she can use. Please return call at [phone]

## 2013-12-18 NOTE — Progress Notes (Signed)
Subjective:    Patient ID: [name], female    DOB: 1946-03-10, 68 y.o.   MRN: [id number]  HPI [name] is a 68 y.o. female   R ear pain, with history of infections in this ear. Cough, HA earlier this week. No fever. Noticed clear colored discharge from R ear last night, crusted this am. Feels blocked and swollen with some discomfort this am.   Tx: none.  Does use hairpin to clean out the ears, did use last night but had already been draining.    Patient Active Problem List   Diagnosis Date Noted  . Allergic rhinitis, cause unspecified 03/23/2013  . Medicare annual wellness visit, subsequent 09/15/2012  . Cancer of central portion of left female breast 08/25/2012  . Obstructive sleep apnea on CPAP 04/21/2012  . Diabetes 12/28/2011  . HTN (hypertension) 12/28/2011  . Hyperlipidemia 12/28/2011  . Uterine bleeding 04/30/2011   Past Medical History  Diagnosis Date  . Postmenopausal bleeding   . Fibroids   . Headache(784.0)     tension  . Seasonal allergies   . Anemia     due to vaginal bleeding  . Urinary urgency   . Hypertension     under control with med., has been on med. x 3 yr.  . White coat hypertension   . History of endometriosis   . Breast cancer 08/2012    left  . Sleep apnea     uses CPAP nightly  . Arthritis     hands  . Dental crowns present   . Hyperlipidemia   . OSA (obstructive sleep apnea)   . S/P radiation therapy 10/20/2012-12/05/2012    left breast 50.4 gray, lumpectomy cavity boosted to 62.4 gray  . Allergy    Past Surgical History  Procedure Laterality Date  . Hysteroscopy w/d&c  05/06/2009  . Exploratory laparotomy  06-30-2007    ATTEMPTED HYSTERECTOMY ABORTED DUE TO EXTENSIVE ENDOMETRIOSIS W/ DENSE PELVIC ADHESIONS INVOLVING UTERUS AND LOWER RECTOSIGMOID  . Cholecystectomy  1995  . Colonoscopy w/ polypectomy  03/20/2007  . Dilation and curettage of uterus  2005  . Dilation and curettage of uterus  06/22/2011    Procedure:  DILATATION AND CURETTAGE;  Surgeon: Alvino Chapel, MD;  Location: [facility];  Service: Gynecology;  Laterality: N/A;  OK PER KEELA FOR 7:15 START  . Breast lumpectomy with needle localization and axillary sentinel lymph node bx Left 09/03/2012    Procedure: BREAST LUMPECTOMY WITH NEEDLE LOCALIZATION AND AXILLARY SENTINEL LYMPH NODE BX;  Surgeon: Edward Jolly, MD;  Location: [place];  Service: General;  Laterality: Left;  . Back surgery  1997    lumbar  . Dorsal compartment release Right 05/19/2013    Procedure: RELEASE 1ST  DORSAL COMPARTMENT RIGHT (DEQUERVAIN);  Surgeon: Cammie Sickle., MD;  Location: [facility];  Service: Orthopedics;  Laterality: Right;   Allergies  Allergen Reactions  . Latex Itching and Other (See Comments)    Causes blisters  . Adhesive [Tape] Rash   Prior to Admission medications   Medication Sig Start Date End Date Taking? Authorizing Provider  anastrozole (ARIMIDEX) 1 MG tablet Take 1 mg by mouth daily.  12/01/12  Yes Historical Provider, MD  aspirin 81 MG tablet Take 81 mg by mouth daily.   Yes Historical Provider, MD  Cyanocobalamin (VITAMIN B-12 CR PO) Take 500 mcg by mouth daily.    Yes Historical Provider, MD  fluticasone (FLONASE) 50  MCG/ACT nasal spray Place 2 sprays into both nostrils daily. As needed 03/23/13  Yes [name], MD  lisinopril-hydrochlorothiazide (PRINZIDE,ZESTORETIC) 10-12.5 MG per tablet Take 0.5 tablets by mouth daily. 03/23/13 04/14/14 Yes [name], MD  Loratadine-Pseudoephedrine [facility] ALLERGY RELIEF D, LORATID, PO) Take by mouth as needed.   Yes Historical Provider, MD  meloxicam (MOBIC) 15 MG tablet Take 1 tablet (15 mg total) by mouth daily as needed for pain. 11/16/13  Yes [name], MD  Naproxen Sodium (ALEVE) 220 MG CAPS Take 220 mg by mouth 2 (two) times daily.   Yes Historical Provider, MD  vitamin C (ASCORBIC ACID) 500 MG tablet Take 1,000 mg by mouth daily.     Yes Historical Provider, MD  Zinc 50 MG TABS Take 1 tablet by mouth daily.   Yes Historical Provider, MD  calcium citrate-vitamin D (CITRACAL+D) 315-200 MG-UNIT per tablet Take 1 tablet by mouth 2 (two) times daily. 600 mg. Calcium, 400 mg. Vit. D3    Historical Provider, MD  fluconazole (DIFLUCAN) 100 MG tablet Two tablets daily x 1 day then one tablet daily for six days 11/20/13   [name], MD  lovastatin (MEVACOR) 20 MG tablet Take 1 tablet (20 mg total) by mouth at bedtime. 03/23/13   [name], MD  Omega-3 Fatty Acids (FISH OIL) 1000 MG CAPS Take 1 capsule by mouth daily.    Historical Provider, MD  terconazole (TERAZOL 7) 0.4 % vaginal cream Place 1 applicator vaginally 2 (two) times daily. 11/16/13   [name], MD   History   Social History  . Marital Status: Married    Spouse Name: [name]    Number of Children: 4  . Years of Education: College   Occupational History  . retired    Social History Main Topics  . Smoking status: Never Smoker   . Smokeless tobacco: Never Used  . Alcohol Use: No  . Drug Use: No  . Sexual Activity: Yes     Comment: menarche 1, P4, Premarin for short while, Provera x 3 years   Other Topics Concern  . Not on file   Social History Narrative   Marital status: married x 61 years; happily married; no abuse.      Children: 4 children; six grandchildren; no gg.      Lives:  With husband.      Employed: retired in 2011; [employer].      Tobacco: none       Alcohol:  None      Drugs: none      Exercise:  Daily. Walking x 1 hour three days per week.      Seatbelt:  100%      Sunscreen:  Face SPF 15.      Guns:  Loaded mostly secured.      ADLs:  Drives; no assistant devices; independent with all ADLs.       Her nocturia is improved under CPAP use at 10 cm water- AHI is now 0.8 and from 30 at baseline. CMS compliance  6 hours 40 minutes - sleeps on the side, 08-10-11 .FFM - likes it.    . Flonase used prn.      Caffeine Use: 1 cup daily     Review of Systems  Constitutional: Negative for fever and chills.  HENT: Positive for ear discharge and ear pain. Negative for facial swelling.   Respiratory: Positive for cough.   Neurological: Positive for headaches.  Objective:   Physical Exam  Vitals reviewed. Constitutional: She is oriented to person, place, and time. She appears well-developed and well-nourished. No distress.  HENT:  Head: Normocephalic and atraumatic.  Right Ear: There is drainage and swelling (edematous canal with yellow adherent discharge proximally. unable to visualize TM with proximal canal stenosis. no mastoid or pinna ttp. ). Decreased hearing is noted.  Left Ear: Hearing, tympanic membrane, external ear and ear canal normal.  Nose: Nose normal.  Mouth/Throat: Oropharynx is clear and moist. No oropharyngeal exudate.  Eyes: Conjunctivae and EOM are normal. Pupils are equal, round, and reactive to light.  Cardiovascular: Normal rate, regular rhythm, normal heart sounds and intact distal pulses.   No murmur heard. Pulmonary/Chest: Effort normal and breath sounds normal. No respiratory distress. She has no wheezes. She has no rhonchi.  Neurological: She is alert and oriented to person, place, and time.  Skin: Skin is warm and dry. No rash noted.  Psychiatric: She has a normal mood and affect. Her behavior is normal.   Filed Vitals:   12/18/13 1222  BP: 164/84  Pulse: 80  Temp: 97.4 F (36.3 C)  TempSrc: Oral  Resp: 16  Height: 5\' 2"  (1.575 m)  Weight: 199 lb (90.266 kg)  SpO2: 99%      Assessment & Plan:   LYNDSY GILBERTO is a 68 y.o. female Otalgia of right ear - Plan: ofloxacin (FLOXIN) 0.3 % otic solution  Otitis, externa, infective, right - Plan: ofloxacin (FLOXIN) 0.3 % otic solution  Otitis externa without apparent complete blockage. No active clear discharge, and doubt TM rupture from instrumentation as d/c present prior to cleaning ears. unable to  completely visualize TM.  Floxin otic Rx initially provided but 2 calls from pharmacy d/t cost prohibitive of this and cipro otic.  Agreed to try cortisporin otic as covered, but discussed risks of neomycin and ototoxicity if rupture was present.  Recheck in 4 days, btu if any increased pain in ear with drops - stop them and rtc sooner. Advised over the phone.   Patient Instructions  Start antibiotic drops as discussed daily. Avoid placing anything into ear canal and in future if feels blocked with wax can try over the counter Debrox or return here to have removed.  Recheck in 4 days to make sure the swelling has improved.  If any increased pain, headache, fevers, or other worsening symptoms - return sooner.   Return to the clinic or go to the nearest emergency room if any of your symptoms worsen or new symptoms occur.  Otitis Externa Otitis externa is a bacterial or fungal infection of the outer ear canal. This is the area from the eardrum to the outside of the ear. Otitis externa is sometimes called "swimmer's ear." CAUSES  Possible causes of infection include:  Swimming in dirty water.  Moisture remaining in the ear after swimming or bathing.  Mild injury (trauma) to the ear.  Objects stuck in the ear (foreign body).  Cuts or scrapes (abrasions) on the outside of the ear. SIGNS AND SYMPTOMS  The first symptom of infection is often itching in the ear canal. Later signs and symptoms may include swelling and redness of the ear canal, ear pain, and yellowish-white fluid (pus) coming from the ear. The ear pain may be worse when pulling on the earlobe. DIAGNOSIS  Your health care provider will perform a physical exam. A sample of fluid may be taken from the ear and examined for bacteria or fungi. TREATMENT  Antibiotic ear drops are often given for 10 to 14 days. Treatment may also include pain medicine or corticosteroids to reduce itching and swelling. HOME CARE INSTRUCTIONS   Apply  antibiotic ear drops to the ear canal as prescribed by your health care provider.  Take medicines only as directed by your health care provider.  If you have diabetes, follow any additional treatment instructions from your health care provider.  Keep all follow-up visits as directed by your health care provider. PREVENTION   Keep your ear dry. Use the corner of a towel to absorb water out of the ear canal after swimming or bathing.  Avoid scratching or putting objects inside your ear. This can damage the ear canal or remove the protective wax that lines the canal. This makes it easier for bacteria and fungi to grow.  Avoid swimming in lakes, polluted water, or poorly chlorinated pools.  You may use ear drops made of rubbing alcohol and vinegar after swimming. Combine equal parts of white vinegar and alcohol in a bottle. Put 3 or 4 drops into each ear after swimming. SEEK MEDICAL CARE IF:   You have a fever.  Your ear is still red, swollen, painful, or draining pus after 3 days.  Your redness, swelling, or pain gets worse.  You have a severe headache.  You have redness, swelling, pain, or tenderness in the area behind your ear. MAKE SURE YOU:   Understand these instructions.  Will watch your condition.  Will get help right away if you are not doing well or get worse. Document Released: 03/05/2005 Document Revised: 07/20/2013 Document Reviewed: 03/22/2011 [facility] Patient Information 2015 [place], [place]. This information is not intended to replace advice given to you by your health care provider. Make sure you discuss any questions you have with your health care provider.

## 2013-12-18 NOTE — Patient Instructions (Signed)
Start antibiotic drops as discussed daily. Avoid placing anything into ear canal and in future if feels blocked with wax can try over the counter Debrox or return here to have removed.  Recheck in 4 days to make sure the swelling has improved.  If any increased pain, headache, fevers, or other worsening symptoms - return sooner.   Return to the clinic or go to the nearest emergency room if any of your symptoms worsen or new symptoms occur.  Otitis Externa Otitis externa is a bacterial or fungal infection of the outer ear canal. This is the area from the eardrum to the outside of the ear. Otitis externa is sometimes called "swimmer's ear." CAUSES  Possible causes of infection include:  Swimming in dirty water.  Moisture remaining in the ear after swimming or bathing.  Mild injury (trauma) to the ear.  Objects stuck in the ear (foreign body).  Cuts or scrapes (abrasions) on the outside of the ear. SIGNS AND SYMPTOMS  The first symptom of infection is often itching in the ear canal. Later signs and symptoms may include swelling and redness of the ear canal, ear pain, and yellowish-white fluid (pus) coming from the ear. The ear pain may be worse when pulling on the earlobe. DIAGNOSIS  Your health care provider will perform a physical exam. A sample of fluid may be taken from the ear and examined for bacteria or fungi. TREATMENT  Antibiotic ear drops are often given for 10 to 14 days. Treatment may also include pain medicine or corticosteroids to reduce itching and swelling. HOME CARE INSTRUCTIONS   Apply antibiotic ear drops to the ear canal as prescribed by your health care provider.  Take medicines only as directed by your health care provider.  If you have diabetes, follow any additional treatment instructions from your health care provider.  Keep all follow-up visits as directed by your health care provider. PREVENTION   Keep your ear dry. Use the corner of a towel to absorb water  out of the ear canal after swimming or bathing.  Avoid scratching or putting objects inside your ear. This can damage the ear canal or remove the protective wax that lines the canal. This makes it easier for bacteria and fungi to grow.  Avoid swimming in lakes, polluted water, or poorly chlorinated pools.  You may use ear drops made of rubbing alcohol and vinegar after swimming. Combine equal parts of white vinegar and alcohol in a bottle. Put 3 or 4 drops into each ear after swimming. SEEK MEDICAL CARE IF:   You have a fever.  Your ear is still red, swollen, painful, or draining pus after 3 days.  Your redness, swelling, or pain gets worse.  You have a severe headache.  You have redness, swelling, pain, or tenderness in the area behind your ear. MAKE SURE YOU:   Understand these instructions.  Will watch your condition.  Will get help right away if you are not doing well or get worse. Document Released: 03/05/2005 Document Revised: 07/20/2013 Document Reviewed: 03/22/2011 [facility] Patient Information 2015 [place], [place]. This information is not intended to replace advice given to you by your health care provider. Make sure you discuss any questions you have with your health care provider.

## 2013-12-18 NOTE — Telephone Encounter (Signed)
Pt.notified

## 2013-12-22 ENCOUNTER — Ambulatory Visit (INDEPENDENT_AMBULATORY_CARE_PROVIDER_SITE_OTHER): Payer: Medicare Other | Admitting: Family Medicine

## 2013-12-22 VITALS — BP 144/62 | HR 76 | Temp 98.1°F | Resp 16 | Ht 62.0 in | Wt 200.0 lb

## 2013-12-22 DIAGNOSIS — H60391 Other infective otitis externa, right ear: Secondary | ICD-10-CM

## 2013-12-22 NOTE — Patient Instructions (Signed)
Continue antibiotic ear drops for a total of 10 days. (call if you need a refill and we can send one in). Recheck with Dr. Carlota Raspberry Tuesday 12/29/13 after 4pm, or Wednesday 12/30/13 after 5pm to take a better look at the eardrum. Return to the clinic or go to the nearest emergency room if any of your symptoms worsen or new symptoms occur.  Otitis Externa Otitis externa is a bacterial or fungal infection of the outer ear canal. This is the area from the eardrum to the outside of the ear. Otitis externa is sometimes called "swimmer's ear." CAUSES  Possible causes of infection include:  Swimming in dirty water.  Moisture remaining in the ear after swimming or bathing.  Mild injury (trauma) to the ear.  Objects stuck in the ear (foreign body).  Cuts or scrapes (abrasions) on the outside of the ear. SIGNS AND SYMPTOMS  The first symptom of infection is often itching in the ear canal. Later signs and symptoms may include swelling and redness of the ear canal, ear pain, and yellowish-white fluid (pus) coming from the ear. The ear pain may be worse when pulling on the earlobe. DIAGNOSIS  Your health care provider will perform a physical exam. A sample of fluid may be taken from the ear and examined for bacteria or fungi. TREATMENT  Antibiotic ear drops are often given for 10 to 14 days. Treatment may also include pain medicine or corticosteroids to reduce itching and swelling. HOME CARE INSTRUCTIONS   Apply antibiotic ear drops to the ear canal as prescribed by your health care provider.  Take medicines only as directed by your health care provider.  If you have diabetes, follow any additional treatment instructions from your health care provider.  Keep all follow-up visits as directed by your health care provider. PREVENTION   Keep your ear dry. Use the corner of a towel to absorb water out of the ear canal after swimming or bathing.  Avoid scratching or putting objects inside your ear.  This can damage the ear canal or remove the protective wax that lines the canal. This makes it easier for bacteria and fungi to grow.  Avoid swimming in lakes, polluted water, or poorly chlorinated pools.  You may use ear drops made of rubbing alcohol and vinegar after swimming. Combine equal parts of white vinegar and alcohol in a bottle. Put 3 or 4 drops into each ear after swimming. SEEK MEDICAL CARE IF:   You have a fever.  Your ear is still red, swollen, painful, or draining pus after 3 days.  Your redness, swelling, or pain gets worse.  You have a severe headache.  You have redness, swelling, pain, or tenderness in the area behind your ear. MAKE SURE YOU:   Understand these instructions.  Will watch your condition.  Will get help right away if you are not doing well or get worse. Document Released: 03/05/2005 Document Revised: 07/20/2013 Document Reviewed: 03/22/2011 [facility] Patient Information 2015 [place], [place]. This information is not intended to replace advice given to you by your health care provider. Make sure you discuss any questions you have with your health care provider.

## 2013-12-22 NOTE — Progress Notes (Addendum)
Subjective:    Patient ID: [name], female    DOB: December 24, 1945, 68 y.o.   MRN: [id number]  This chart was scribed for Wendie Agreste, MD at [facility] by Rayfield Citizen, medical scribe. This patient was seen in room Room 3 and the patient's care was started at 12:13 PM.   HPI  HPI Comments: [name] is a 68 y.o. female who presents to the [facility] for follow up regarding her outer ear infection.   Patient was seen here four days prior for her otitis media; patient and provider agreed on a medication which was dispensed every four hours every day since, three times yesterday. Patient states she is doing much better after her round of medication; she noticed several instances of "popping" as her hearing was coming back, but she is hearing fine out of both ears at present.. She denies watery discharge. She denies fevers, chills.   Patient Active Problem List   Diagnosis Date Noted  . Allergic rhinitis, cause unspecified 03/23/2013  . Medicare annual wellness visit, subsequent 09/15/2012  . Cancer of central portion of left female breast 08/25/2012  . Obstructive sleep apnea on CPAP 04/21/2012  . Diabetes 12/28/2011  . HTN (hypertension) 12/28/2011  . Hyperlipidemia 12/28/2011  . Uterine bleeding 04/30/2011   Past Medical History  Diagnosis Date  . Postmenopausal bleeding   . Fibroids   . Headache(784.0)     tension  . Seasonal allergies   . Anemia     due to vaginal bleeding  . Urinary urgency   . Hypertension     under control with med., has been on med. x 3 yr.  . White coat hypertension   . History of endometriosis   . Breast cancer 08/2012    left  . Sleep apnea     uses CPAP nightly  . Arthritis     hands  . Dental crowns present   . Hyperlipidemia   . OSA (obstructive sleep apnea)   . S/P radiation therapy 10/20/2012-12/05/2012    left breast 50.4 gray, lumpectomy cavity boosted to 62.4 gray  . Allergy     Past Surgical History  Procedure Laterality Date  . Hysteroscopy w/d&c  05/06/2009  . Exploratory laparotomy  06-30-2007    ATTEMPTED HYSTERECTOMY ABORTED DUE TO EXTENSIVE ENDOMETRIOSIS W/ DENSE PELVIC ADHESIONS INVOLVING UTERUS AND LOWER RECTOSIGMOID  . Cholecystectomy  1995  . Colonoscopy w/ polypectomy  03/20/2007  . Dilation and curettage of uterus  2005  . Dilation and curettage of uterus  06/22/2011    Procedure: DILATATION AND CURETTAGE;  Surgeon: Alvino Chapel, MD;  Location: [facility];  Service: Gynecology;  Laterality: N/A;  OK PER KEELA FOR 7:15 START  . Breast lumpectomy with needle localization and axillary sentinel lymph node bx Left 09/03/2012    Procedure: BREAST LUMPECTOMY WITH NEEDLE LOCALIZATION AND AXILLARY SENTINEL LYMPH NODE BX;  Surgeon: Edward Jolly, MD;  Location: [facility];  Service: General;  Laterality: Left;  . Back surgery  1997    lumbar  . Dorsal compartment release Right 05/19/2013    Procedure: RELEASE 1ST  DORSAL COMPARTMENT RIGHT (DEQUERVAIN);  Surgeon: Cammie Sickle., MD;  Location: [facility];  Service: Orthopedics;  Laterality: Right;   Allergies  Allergen Reactions  . Latex Itching and Other (See Comments)    Causes blisters  . Adhesive [Tape] Rash   Prior to Admission  medications   Medication Sig Start Date End Date Taking? Authorizing Provider  anastrozole (ARIMIDEX) 1 MG tablet Take 1 mg by mouth daily.  12/01/12  Yes Historical Provider, MD  aspirin 81 MG tablet Take 81 mg by mouth daily.   Yes Historical Provider, MD  Cyanocobalamin (VITAMIN B-12 CR PO) Take 500 mcg by mouth daily.    Yes Historical Provider, MD  fluticasone (FLONASE) 50 MCG/ACT nasal spray Place 2 sprays into both nostrils daily. As needed 03/23/13  Yes [name], MD  lisinopril-hydrochlorothiazide (PRINZIDE,ZESTORETIC) 10-12.5 MG per tablet Take 0.5 tablets by mouth daily. 03/23/13 04/14/14 Yes [name],  MD  Loratadine-Pseudoephedrine [facility] ALLERGY RELIEF D, LORATID, PO) Take by mouth as needed.   Yes Historical Provider, MD  lovastatin (MEVACOR) 20 MG tablet Take 1 tablet (20 mg total) by mouth at bedtime. 03/23/13  Yes [name], MD  Omega-3 Fatty Acids (FISH OIL) 1000 MG CAPS Take 1 capsule by mouth daily.   Yes Historical Provider, MD  vitamin C (ASCORBIC ACID) 500 MG tablet Take 1,000 mg by mouth daily.    Yes Historical Provider, MD  Zinc 50 MG TABS Take 1 tablet by mouth daily.   Yes Historical Provider, MD   History   Social History  . Marital Status: Married    Spouse Name: [name]    Number of Children: 4  . Years of Education: College   Occupational History  . retired    Social History Main Topics  . Smoking status: Never Smoker   . Smokeless tobacco: Never Used  . Alcohol Use: No  . Drug Use: No  . Sexual Activity: Yes     Comment: menarche 59, P4, Premarin for short while, Provera x 3 years   Other Topics Concern  . Not on file   Social History Narrative   Marital status: married x 4 years; happily married; no abuse.      Children: 4 children; six grandchildren; no gg.      Lives:  With husband.      Employed: retired in 2011; [employer].      Tobacco: none       Alcohol:  None      Drugs: none      Exercise:  Daily. Walking x 1 hour three days per week.      Seatbelt:  100%      Sunscreen:  Face SPF 15.      Guns:  Loaded mostly secured.      ADLs:  Drives; no assistant devices; independent with all ADLs.       Her nocturia is improved under CPAP use at 10 cm water- AHI is now 0.8 and from 30 at baseline. CMS compliance  6 hours 40 minutes - sleeps on the side, 08-10-11 .FFM - likes it.    . Flonase used prn.     Caffeine Use: 1 cup daily    Review of Systems  Constitutional: Negative for fever, fatigue and unexpected weight change.  HENT: Negative for ear discharge, ear pain and hearing loss.   Respiratory: Negative for chest  tightness and shortness of breath.   Cardiovascular: Negative for chest pain, palpitations and leg swelling.  Gastrointestinal: Negative for abdominal pain and blood in stool.  Neurological: Negative for dizziness, syncope, light-headedness and headaches.       Objective:   Physical Exam  Vitals reviewed. Constitutional: She is oriented to person, place, and time. She appears well-developed and well-nourished.  No distress.  HENT:  Head: Normocephalic and atraumatic.  Right Ear: Hearing, tympanic membrane, external ear and ear canal normal.  Left Ear: Hearing, tympanic membrane, external ear and ear canal normal.  Nose: Nose normal.  Mouth/Throat: Oropharynx is clear and moist. No oropharyngeal exudate.  Right ear: external ear is nontender, no swelling. Mastoid is nontender. No rash. In canal, there is adherent white exudate but the canal is patent and the visualized portion of the TM appears intact, without any redness or apparent effusion, but few white appearing lines or "pseudomembranes" vs adherent exudate on tm.  Hearing grossly normal.   Eyes: Conjunctivae and EOM are normal. Pupils are equal, round, and reactive to light.  Cardiovascular: Normal rate, regular rhythm, normal heart sounds and intact distal pulses.   No murmur heard. Pulmonary/Chest: Effort normal and breath sounds normal. No respiratory distress. She has no wheezes. She has no rhonchi.  Neurological: She is alert and oriented to person, place, and time.  Skin: Skin is warm and dry. No rash noted.  Psychiatric: She has a normal mood and affect. Her behavior is normal.     Filed Vitals:   12/22/13 1028  BP: 144/62  Pulse: 76  Temp: 98.1 F (36.7 C)  TempSrc: Oral  Resp: 16  Height: 5\' 2"  (1.575 m)  Weight: 200 lb (90.719 kg)  SpO2: 100%       Assessment & Plan:  [name] is a 68 y.o. female Otitis, externa, infective, right Improving with decreased discomfort and hearing normally now. Doubt TM  rupture. 2nd MD exam of TM - possible adherent exudate vs pseudomembrane, but no definitive rupture.  Will recheck in 1 week to evaluate TM again, sooner if worse. Ok to refill cortisporin gtts if needed. rtc precautions.   No orders of the defined types were placed in this encounter.   Patient Instructions  Continue antibiotic ear drops for a total of 10 days. (call if you need a refill and we can send one in). Recheck with Dr. Carlota Raspberry Tuesday 12/29/13 after 4pm, or Wednesday 12/30/13 after 5pm to take a better look at the eardrum. Return to the clinic or go to the nearest emergency room if any of your symptoms worsen or new symptoms occur.  Otitis Externa Otitis externa is a bacterial or fungal infection of the outer ear canal. This is the area from the eardrum to the outside of the ear. Otitis externa is sometimes called "swimmer's ear." CAUSES  Possible causes of infection include:  Swimming in dirty water.  Moisture remaining in the ear after swimming or bathing.  Mild injury (trauma) to the ear.  Objects stuck in the ear (foreign body).  Cuts or scrapes (abrasions) on the outside of the ear. SIGNS AND SYMPTOMS  The first symptom of infection is often itching in the ear canal. Later signs and symptoms may include swelling and redness of the ear canal, ear pain, and yellowish-white fluid (pus) coming from the ear. The ear pain may be worse when pulling on the earlobe. DIAGNOSIS  Your health care provider will perform a physical exam. A sample of fluid may be taken from the ear and examined for bacteria or fungi. TREATMENT  Antibiotic ear drops are often given for 10 to 14 days. Treatment may also include pain medicine or corticosteroids to reduce itching and swelling. HOME CARE INSTRUCTIONS   Apply antibiotic ear drops to the ear canal as prescribed by your health care provider.  Take medicines only as directed  by your health care provider.  If you have diabetes, follow any  additional treatment instructions from your health care provider.  Keep all follow-up visits as directed by your health care provider. PREVENTION   Keep your ear dry. Use the corner of a towel to absorb water out of the ear canal after swimming or bathing.  Avoid scratching or putting objects inside your ear. This can damage the ear canal or remove the protective wax that lines the canal. This makes it easier for bacteria and fungi to grow.  Avoid swimming in lakes, polluted water, or poorly chlorinated pools.  You may use ear drops made of rubbing alcohol and vinegar after swimming. Combine equal parts of white vinegar and alcohol in a bottle. Put 3 or 4 drops into each ear after swimming. SEEK MEDICAL CARE IF:   You have a fever.  Your ear is still red, swollen, painful, or draining pus after 3 days.  Your redness, swelling, or pain gets worse.  You have a severe headache.  You have redness, swelling, pain, or tenderness in the area behind your ear. MAKE SURE YOU:   Understand these instructions.  Will watch your condition.  Will get help right away if you are not doing well or get worse. Document Released: 03/05/2005 Document Revised: 07/20/2013 Document Reviewed: 03/22/2011 [facility] Patient Information 2015 [place], [place]. This information is not intended to replace advice given to you by your health care provider. Make sure you discuss any questions you have with your health care provider.       I personally performed the services described in this documentation, which was scribed in my presence. The recorded information has been reviewed and considered, and addended by me as needed.

## 2013-12-29 ENCOUNTER — Ambulatory Visit (INDEPENDENT_AMBULATORY_CARE_PROVIDER_SITE_OTHER): Payer: Medicare Other | Admitting: Family Medicine

## 2013-12-29 VITALS — BP 140/64 | HR 92 | Temp 97.5°F | Resp 16 | Ht 62.0 in | Wt 197.2 lb

## 2013-12-29 DIAGNOSIS — H60391 Other infective otitis externa, right ear: Secondary | ICD-10-CM

## 2013-12-29 NOTE — Patient Instructions (Signed)
As your ear is feeling well, can stop antibiotics after today, but if swelling, blocked feeling, soreness, or change in hearing returns - recheck as external ear infections are sometimes due to a fungus. .Return to the clinic or go to the nearest emergency room if any of your symptoms worsen or new symptoms occur.

## 2013-12-29 NOTE — Progress Notes (Signed)
Subjective:  This chart was scribed for Diane Ray, MD by [name], ED Scribe. This Patient was seen in room 05 and the patients care was started at 4:40 PM   Patient ID: [name], female    DOB: [birth year], 68 y.o.   MRN: [id number]  Chief Complaint  Patient presents with  . Follow-up    otitis    HPI HPI Comments: [name] is a 68 y.o. female who presents to the [facility] for follow up of right otitis externa. See office visit on October 2 and Octoberr 6. Did not appear to have true TM rupture but some trouble visualizing initially due to swollen canal. Treated with cortisporin otic due to other antibiotics being cost prohibitive. Hearing back to normal at last visit. Here for recheck of TM.   She states pain in her right hear has completely resided. Denies ear pain or hearing loss. She states she is still compliant with taking the antibiotics. No new symptoms and hearing feels normal.   Patient Active Problem List   Diagnosis Date Noted  . Allergic rhinitis, cause unspecified 03/23/2013  . Medicare annual wellness visit, subsequent 09/15/2012  . Cancer of central portion of left female breast 08/25/2012  . Obstructive sleep apnea on CPAP 04/21/2012  . Diabetes 12/28/2011  . HTN (hypertension) 12/28/2011  . Hyperlipidemia 12/28/2011  . Uterine bleeding 04/30/2011   Past Medical History  Diagnosis Date  . Postmenopausal bleeding   . Fibroids   . Headache(784.0)     tension  . Seasonal allergies   . Anemia     due to vaginal bleeding  . Urinary urgency   . Hypertension     under control with med., has been on med. x 3 yr.  . White coat hypertension   . History of endometriosis   . Breast cancer 08/2012    left  . Sleep apnea     uses CPAP nightly  . Arthritis     hands  . Dental crowns present   . Hyperlipidemia   . OSA (obstructive sleep apnea)   . S/P radiation therapy 10/20/2012-12/05/2012    left breast 50.4 gray,  lumpectomy cavity boosted to 62.4 gray  . Allergy    Past Surgical History  Procedure Laterality Date  . Hysteroscopy w/d&c  05/06/2009  . Exploratory laparotomy  06-30-2007    ATTEMPTED HYSTERECTOMY ABORTED DUE TO EXTENSIVE ENDOMETRIOSIS W/ DENSE PELVIC ADHESIONS INVOLVING UTERUS AND LOWER RECTOSIGMOID  . Cholecystectomy  1995  . Colonoscopy w/ polypectomy  03/20/2007  . Dilation and curettage of uterus  2005  . Dilation and curettage of uterus  06/22/2011    Procedure: DILATATION AND CURETTAGE;  Surgeon: Alvino Chapel, MD;  Location: [facility];  Service: Gynecology;  Laterality: N/A;  OK PER KEELA FOR 7:15 START  . Breast lumpectomy with needle localization and axillary sentinel lymph node bx Left 09/03/2012    Procedure: BREAST LUMPECTOMY WITH NEEDLE LOCALIZATION AND AXILLARY SENTINEL LYMPH NODE BX;  Surgeon: Edward Jolly, MD;  Location: [facility];  Service: General;  Laterality: Left;  . Back surgery  1997    lumbar  . Dorsal compartment release Right 05/19/2013    Procedure: RELEASE 1ST  DORSAL COMPARTMENT RIGHT (DEQUERVAIN);  Surgeon: Cammie Sickle., MD;  Location: [facility];  Service: Orthopedics;  Laterality: Right;   Allergies  Allergen Reactions  . Latex Itching and Other (See Comments)  Causes blisters  . Adhesive [Tape] Rash   Prior to Admission medications   Medication Sig Start Date End Date Taking? Authorizing Provider  anastrozole (ARIMIDEX) 1 MG tablet Take 1 mg by mouth daily.  12/01/12  Yes Historical Provider, MD  aspirin 81 MG tablet Take 81 mg by mouth daily.   Yes Historical Provider, MD  Cyanocobalamin (VITAMIN B-12 CR PO) Take 500 mcg by mouth daily.    Yes Historical Provider, MD  fluticasone (FLONASE) 50 MCG/ACT nasal spray Place 2 sprays into both nostrils daily. As needed 03/23/13  Yes [name], MD  lisinopril-hydrochlorothiazide (PRINZIDE,ZESTORETIC) 10-12.5 MG per tablet Take 0.5 tablets  by mouth daily. 03/23/13 04/14/14 Yes [name], MD  Loratadine-Pseudoephedrine [facility] ALLERGY RELIEF D, LORATID, PO) Take by mouth as needed.   Yes Historical Provider, MD  lovastatin (MEVACOR) 20 MG tablet Take 1 tablet (20 mg total) by mouth at bedtime. 03/23/13  Yes [name], MD  neomycin-polymyxin-hydrocortisone (CORTISPORIN) 3.5-10000-1 otic suspension Place 4 drops into the right ear 3 (three) times daily.   Yes Historical Provider, MD  Omega-3 Fatty Acids (FISH OIL) 1000 MG CAPS Take 1 capsule by mouth daily.   Yes Historical Provider, MD  vitamin C (ASCORBIC ACID) 500 MG tablet Take 1,000 mg by mouth daily.    Yes Historical Provider, MD  Zinc 50 MG TABS Take 1 tablet by mouth daily.   Yes Historical Provider, MD   History   Social History  . Marital Status: Married    Spouse Name: [name]    Number of Children: 4  . Years of Education: College   Occupational History  . retired    Social History Main Topics  . Smoking status: Never Smoker   . Smokeless tobacco: Never Used  . Alcohol Use: No  . Drug Use: No  . Sexual Activity: Yes     Comment: menarche 74, P4, Premarin for short while, Provera x 3 years   Other Topics Concern  . Not on file   Social History Narrative   Marital status: married x 62 years; happily married; no abuse.      Children: 4 children; six grandchildren; no gg.      Lives:  With husband.      Employed: retired in 2011; [employer].      Tobacco: none       Alcohol:  None      Drugs: none      Exercise:  Daily. Walking x 1 hour three days per week.      Seatbelt:  100%      Sunscreen:  Face SPF 15.      Guns:  Loaded mostly secured.      ADLs:  Drives; no assistant devices; independent with all ADLs.       Her nocturia is improved under CPAP use at 10 cm water- AHI is now 0.8 and from 30 at baseline. CMS compliance  6 hours 40 minutes - sleeps on the side, 08-10-11 .FFM - likes it.    . Flonase used prn.     Caffeine Use:  1 cup daily   Review of Systems  Constitutional: Negative for fever and chills.  HENT: Negative for ear pain and hearing loss.   Skin: Negative for rash.     Objective:   Filed Vitals:   12/29/13 1613  BP: 140/64  Pulse: 92  Temp: 97.5 F (36.4 C)  TempSrc: Oral  Resp: 16  Height: 5'  2" (1.575 m)  Weight: 197 lb 3.2 oz (89.449 kg)  SpO2: 98%     Physical Exam  Vitals reviewed. Constitutional: She is oriented to person, place, and time. She appears well-developed and well-nourished. No distress.  HENT:  Head: Normocephalic and atraumatic.  Right Ear: Hearing, tympanic membrane, external ear and ear canal normal. No mastoid tenderness.  Left Ear: Hearing, tympanic membrane, external ear and ear canal normal.  Nose: Nose normal.  Mouth/Throat: Oropharynx is clear and moist. No oropharyngeal exudate.  Right ear: Hearing grossly intact even with rubbing fingers together outside of ear, right ear cerumen in canal but no canal edema or erythema. Visualized TM beyond cerumen appears slightly dull grey, but no discharge, no redness and no apparent effusion. Externa ear in non tender.    Eyes: Conjunctivae and EOM are normal. Pupils are equal, round, and reactive to light.  Cardiovascular: Normal rate, regular rhythm, normal heart sounds and intact distal pulses.   No murmur heard. Pulmonary/Chest: Effort normal and breath sounds normal. No respiratory distress. She has no wheezes. She has no rhonchi.  Neurological: She is alert and oriented to person, place, and time.  Skin: Skin is warm and dry. No rash noted.  Psychiatric: She has a normal mood and affect. Her behavior is normal.     Assessment & Plan:    TAKAYLA BAILLIE is a 68 y.o. female Otitis, externa, infective, right  Day 11 of antibiotics and still doing well. Appears to be mostly cerumen in canal, and dull appearing TM, but no rupture or active infection seen. If pain/swelling or new sx's off antibiotics, consider  fungal etiology, or ENT eval if returns. rtc precautions given.   No orders of the defined types were placed in this encounter.   Patient Instructions  As your ear is feeling well, can stop antibiotics after today, but if swelling, blocked feeling, soreness, or change in hearing returns - recheck as external ear infections are sometimes due to a fungus. .Return to the clinic or go to the nearest emergency room if any of your symptoms worsen or new symptoms occur.       I personally performed the services described in this documentation, which was scribed in my presence. The recorded information has been reviewed and considered, and addended by me as needed.

## 2014-01-04 DIAGNOSIS — E119 Type 2 diabetes mellitus without complications: Secondary | ICD-10-CM | POA: Diagnosis not present

## 2014-01-04 DIAGNOSIS — H47321 Drusen of optic disc, right eye: Secondary | ICD-10-CM | POA: Diagnosis not present

## 2014-01-04 DIAGNOSIS — H25813 Combined forms of age-related cataract, bilateral: Secondary | ICD-10-CM | POA: Diagnosis not present

## 2014-02-01 ENCOUNTER — Other Ambulatory Visit: Payer: Self-pay | Admitting: Dermatology

## 2014-02-01 DIAGNOSIS — L814 Other melanin hyperpigmentation: Secondary | ICD-10-CM | POA: Diagnosis not present

## 2014-02-01 DIAGNOSIS — D225 Melanocytic nevi of trunk: Secondary | ICD-10-CM | POA: Diagnosis not present

## 2014-02-01 DIAGNOSIS — L821 Other seborrheic keratosis: Secondary | ICD-10-CM | POA: Diagnosis not present

## 2014-02-01 DIAGNOSIS — D485 Neoplasm of uncertain behavior of skin: Secondary | ICD-10-CM | POA: Diagnosis not present

## 2014-02-01 DIAGNOSIS — D2262 Melanocytic nevi of left upper limb, including shoulder: Secondary | ICD-10-CM | POA: Diagnosis not present

## 2014-02-01 DIAGNOSIS — D1801 Hemangioma of skin and subcutaneous tissue: Secondary | ICD-10-CM | POA: Diagnosis not present

## 2014-02-05 DIAGNOSIS — Z23 Encounter for immunization: Secondary | ICD-10-CM | POA: Diagnosis not present

## 2014-02-09 ENCOUNTER — Other Ambulatory Visit: Payer: Self-pay | Admitting: Hematology and Oncology

## 2014-02-09 ENCOUNTER — Encounter: Payer: Self-pay | Admitting: *Deleted

## 2014-02-09 DIAGNOSIS — C50112 Malignant neoplasm of central portion of left female breast: Secondary | ICD-10-CM

## 2014-02-09 MED ORDER — EXEMESTANE 25 MG PO TABS: 25.0000 mg | ORAL_TABLET | Freq: Every day | ORAL | Status: DC

## 2014-02-09 NOTE — [facility] Oncology Navigator Note (Signed)
Patient returned my call.  She reports that she would be doing well if she didn't continue to have muscle aches and pains from the Arimidex.  She had surgery on her right wrist which she said the surgeon said is  a possible side effect of the Arimidex.  I consulted Dr. Lindi Adie who is going to change her medication to Aromasin to see if she might tolerate it with fewer side effects.  Patient denied any other questions or concerns at this time.  I encouraged her to call me for any needs.

## 2014-02-09 NOTE — [facility] Oncology Navigator Note (Signed)
I called patient to check in.  Voicemail message left with contact information and request that she return my call. 

## 2014-02-10 ENCOUNTER — Other Ambulatory Visit: Payer: Self-pay | Admitting: Hematology and Oncology

## 2014-02-10 ENCOUNTER — Encounter: Payer: Self-pay | Admitting: *Deleted

## 2014-02-10 DIAGNOSIS — C50112 Malignant neoplasm of central portion of left female breast: Secondary | ICD-10-CM

## 2014-02-10 MED ORDER — LETROZOLE 2.5 MG PO TABS: 2.5000 mg | ORAL_TABLET | Freq: Every day | ORAL | Status: DC

## 2014-02-10 NOTE — [facility] Oncology Navigator Note (Signed)
Patient called because she heard from her pharmacy that her newly prescribed Aromasin will cost ~ $400.  She asked if there was anything more cost effective.  I discussed with Dr. Lindi Adie who will prescribe Letrozole.  Patient expressed appreciation.

## 2014-02-18 DIAGNOSIS — C50912 Malignant neoplasm of unspecified site of left female breast: Secondary | ICD-10-CM | POA: Diagnosis not present

## 2014-04-13 ENCOUNTER — Other Ambulatory Visit: Payer: Self-pay | Admitting: Family Medicine

## 2014-04-26 ENCOUNTER — Other Ambulatory Visit: Payer: Self-pay | Admitting: Nurse Practitioner

## 2014-05-17 ENCOUNTER — Ambulatory Visit: Payer: Medicare Other | Admitting: Family Medicine

## 2014-05-24 ENCOUNTER — Telehealth: Payer: Self-pay | Admitting: *Deleted

## 2014-05-24 NOTE — Telephone Encounter (Signed)
I called patient to check in.  Patient reports that she is doing fairly well.  She continues to have muscle aches and pains especially in the morning and evening.  She is taking ibuprofen and Aleve for the pain.  She reports that it is a little less since changing to Letrozole.  She reports that it has impacted her ability to exercise and lose the weight she has gained.  We discussed that increasing her exercise might improve her symptoms.  She shared that she stays very busy with her church and we discussed that this spring she may consider participating in some of the programs offered at [facility].  She denied any other questions or concerns at this time.  We reviewed her next appointments and I encouraged her to call me for any needs.

## 2014-06-04 ENCOUNTER — Telehealth: Payer: Self-pay | Admitting: Family Medicine

## 2014-06-04 NOTE — Telephone Encounter (Signed)
lmom to give [place] a call back see if patient want to get a flu shot if she already had one update in epic

## 2014-06-21 ENCOUNTER — Ambulatory Visit (INDEPENDENT_AMBULATORY_CARE_PROVIDER_SITE_OTHER): Payer: Medicare Other | Admitting: Family Medicine

## 2014-06-21 ENCOUNTER — Encounter: Payer: Self-pay | Admitting: Family Medicine

## 2014-06-21 VITALS — BP 150/76 | HR 81 | Temp 97.9°F | Resp 16 | Ht 61.5 in | Wt 211.4 lb

## 2014-06-21 DIAGNOSIS — I10 Essential (primary) hypertension: Secondary | ICD-10-CM

## 2014-06-21 DIAGNOSIS — E7800 Pure hypercholesterolemia, unspecified: Secondary | ICD-10-CM

## 2014-06-21 DIAGNOSIS — E119 Type 2 diabetes mellitus without complications: Secondary | ICD-10-CM | POA: Diagnosis not present

## 2014-06-21 DIAGNOSIS — C50112 Malignant neoplasm of central portion of left female breast: Secondary | ICD-10-CM

## 2014-06-21 DIAGNOSIS — M5417 Radiculopathy, lumbosacral region: Secondary | ICD-10-CM | POA: Diagnosis not present

## 2014-06-21 DIAGNOSIS — M5416 Radiculopathy, lumbar region: Secondary | ICD-10-CM

## 2014-06-21 DIAGNOSIS — E669 Obesity, unspecified: Secondary | ICD-10-CM

## 2014-06-21 LAB — CBC WITH DIFFERENTIAL/PLATELET
Basophils Absolute: 0.1 10*3/uL (ref 0.0–0.1)
Basophils Relative: 1 % (ref 0–1)
Eosinophils Absolute: 0.3 10*3/uL (ref 0.0–0.7)
Eosinophils Relative: 5 % (ref 0–5)
HCT: 39.7 % (ref 36.0–46.0)
Hemoglobin: 13.6 g/dL (ref 12.0–15.0)
LYMPHS PCT: 23 % (ref 12–46)
Lymphs Abs: 1.4 10*3/uL (ref 0.7–4.0)
MCH: 28.9 pg (ref 26.0–34.0)
MCHC: 34.3 g/dL (ref 30.0–36.0)
MCV: 84.5 fL (ref 78.0–100.0)
MONO ABS: 0.5 10*3/uL (ref 0.1–1.0)
MONOS PCT: 8 % (ref 3–12)
MPV: 9.4 fL (ref 8.6–12.4)
NEUTROS ABS: 4 10*3/uL (ref 1.7–7.7)
Neutrophils Relative %: 63 % (ref 43–77)
Platelets: 257 10*3/uL (ref 150–400)
RBC: 4.7 MIL/uL (ref 3.87–5.11)
RDW: 13.6 % (ref 11.5–15.5)
WBC: 6.3 10*3/uL (ref 4.0–10.5)

## 2014-06-21 LAB — COMPREHENSIVE METABOLIC PANEL
ALT: 19 U/L (ref 0–35)
AST: 18 U/L (ref 0–37)
Albumin: 4.2 g/dL (ref 3.5–5.2)
Alkaline Phosphatase: 136 U/L — ABNORMAL HIGH (ref 39–117)
BUN: 12 mg/dL (ref 6–23)
CALCIUM: 10 mg/dL (ref 8.4–10.5)
CHLORIDE: 100 meq/L (ref 96–112)
CO2: 28 meq/L (ref 19–32)
CREATININE: 0.67 mg/dL (ref 0.50–1.10)
Glucose, Bld: 132 mg/dL — ABNORMAL HIGH (ref 70–99)
Potassium: 4.6 mEq/L (ref 3.5–5.3)
Sodium: 137 mEq/L (ref 135–145)
Total Bilirubin: 0.6 mg/dL (ref 0.2–1.2)
Total Protein: 7.3 g/dL (ref 6.0–8.3)

## 2014-06-21 LAB — LIPID PANEL
Cholesterol: 248 mg/dL — ABNORMAL HIGH (ref 0–200)
HDL: 44 mg/dL — ABNORMAL LOW (ref >=46)
LDL Cholesterol: 188 mg/dL — ABNORMAL HIGH (ref 0–99)
Total CHOL/HDL Ratio: 5.6 Ratio
Triglycerides: 81 mg/dL (ref <150)
VLDL: 16 mg/dL (ref 0–40)

## 2014-06-21 LAB — HEMOGLOBIN A1C
Hgb A1c MFr Bld: 6.4 % — ABNORMAL HIGH (ref <5.7)
MEAN PLASMA GLUCOSE: 137 mg/dL — AB (ref <117)

## 2014-06-21 MED ORDER — METHOCARBAMOL 500 MG PO TABS: 500.0000 mg | ORAL_TABLET | Freq: Four times a day (QID) | ORAL | Status: DC

## 2014-06-21 NOTE — Progress Notes (Addendum)
Subjective:    Patient ID: [name], female    DOB: Dec 18, 1945, 69 y.o.   MRN: [id number]  06/21/2014  Follow-up; Hyperlipidemia; Hypertension; and Diabetes   HPI This 69 y.o. female presents for six month follow-up:  1. Hypertension:  Patient reports good compliance with medication, good tolerance to medication, and good symptom control.  BP last night 174/62.  Taking 1/2 Lisinopril/HCTZ 10-12.5; repeat last night 160/88; pulse 71.  157/75.  125/74.  137/60.129/69.  Weight has increased 11 pounds since last visit.  2. Hyperlipidemia:  Keeps forgetting medication.  Fasting today. Has gained weight since last visit.  3. Glucose Intolerance:  Fasting sugar this morning 154.  Checking rarely.  Needs lancets and test strips.    4.  Breast cancer: having lower back pain.  Thinks back pain secondary to medication.    5.  Lower back pain: L>R buttocks pain; pain radiates into L thigh.  Onset intermittent since fall 2015.  Switched breast cancer medication in 02/2014; medication was very expensive; then switched to current medication.  Pain was improved temporarily.  Now pain is morning and evening.  If overdoes, pain has worsened.  +xrays of lumbar spine; was going to a chiropractor.  S/p xray at [facility].  Dr. Hal Neer is NS.  Dr. Lynann Bologna is previous orthopedist.   Taking Aleve or Ibuprofen for pain; caused stomach upset.     Review of Systems  Constitutional: Negative for fever, chills, diaphoresis and fatigue.  Eyes: Negative for visual disturbance.  Respiratory: Negative for cough and shortness of breath.   Cardiovascular: Negative for chest pain, palpitations and leg swelling.  Gastrointestinal: Negative for nausea, vomiting, abdominal pain, diarrhea and constipation.  Endocrine: Negative for cold intolerance, heat intolerance, polydipsia, polyphagia and polyuria.  Musculoskeletal: Positive for back pain.  Skin: Negative for color change, pallor, rash and wound.    Neurological: Negative for dizziness, tremors, seizures, syncope, facial asymmetry, speech difficulty, weakness, light-headedness, numbness and headaches.    Past Medical History  Diagnosis Date  . Postmenopausal bleeding   . Fibroids   . Headache(784.0)     tension  . Seasonal allergies   . Anemia     due to vaginal bleeding  . Urinary urgency   . Hypertension     under control with med., has been on med. x 3 yr.  . White coat hypertension   . History of endometriosis   . Breast cancer 08/2012    left  . Sleep apnea     uses CPAP nightly  . Arthritis     hands  . Dental crowns present   . Hyperlipidemia   . OSA (obstructive sleep apnea)   . S/P radiation therapy 10/20/2012-12/05/2012    left breast 50.4 gray, lumpectomy cavity boosted to 62.4 gray  . Allergy    Past Surgical History  Procedure Laterality Date  . Hysteroscopy w/d&c  05/06/2009  . Exploratory laparotomy  06-30-2007    ATTEMPTED HYSTERECTOMY ABORTED DUE TO EXTENSIVE ENDOMETRIOSIS W/ DENSE PELVIC ADHESIONS INVOLVING UTERUS AND LOWER RECTOSIGMOID  . Cholecystectomy  1995  . Colonoscopy w/ polypectomy  03/20/2007  . Dilation and curettage of uterus  2005  . Dilation and curettage of uterus  06/22/2011    Procedure: DILATATION AND CURETTAGE;  Surgeon: Alvino Chapel, MD;  Location: [facility];  Service: Gynecology;  Laterality: N/A;  OK PER KEELA FOR 7:15 START  . Breast lumpectomy with needle localization and axillary sentinel lymph node bx Left 09/03/2012  Procedure: BREAST LUMPECTOMY WITH NEEDLE LOCALIZATION AND AXILLARY SENTINEL LYMPH NODE BX;  Surgeon: Edward Jolly, MD;  Location: [facility];  Service: General;  Laterality: Left;  . Back surgery  1997    lumbar  . Dorsal compartment release Right 05/19/2013    Procedure: RELEASE 1ST  DORSAL COMPARTMENT RIGHT (DEQUERVAIN);  Surgeon: Cammie Sickle., MD;  Location: [facility];  Service: Orthopedics;   Laterality: Right;   Allergies  Allergen Reactions  . Latex Itching and Other (See Comments)    Causes blisters  . Adhesive [Tape] Rash   Current Outpatient Prescriptions  Medication Sig Dispense Refill  . aspirin 81 MG tablet Take 81 mg by mouth daily.    . Cholecalciferol (VITAMIN D-3 PO) Take 400 mg by mouth daily.    . Cyanocobalamin (VITAMIN B-12 CR PO) Take 500 mcg by mouth daily.     . fluticasone (FLONASE) 50 MCG/ACT nasal spray Place 2 sprays into both nostrils daily. As needed 16 g 11  . letrozole (FEMARA) 2.5 MG tablet Take 1 tablet (2.5 mg total) by mouth daily. 90 tablet 3  . lisinopril-hydrochlorothiazide (PRINZIDE,ZESTORETIC) 10-12.5 MG per tablet TAKE ONE-HALF TABLET BY MOUTH ONCE DAILY. 90 tablet 0  . Loratadine-Pseudoephedrine (PX ALLERGY RELIEF D, LORATID, PO) Take by mouth as needed.    . lovastatin (MEVACOR) 20 MG tablet Take 1 tablet (20 mg total) by mouth at bedtime. 90 tablet 3  . Omega-3 Fatty Acids (FISH OIL) 1000 MG CAPS Take 1 capsule by mouth daily.    Marland Kitchen OVER THE COUNTER MEDICATION daily.    . Turmeric 500 MG CAPS Take 500 mg by mouth daily.    . vitamin C (ASCORBIC ACID) 500 MG tablet Take 1,000 mg by mouth daily.     . Zinc 50 MG TABS Take 1 tablet by mouth daily.    . methocarbamol (ROBAXIN) 500 MG tablet Take 1 tablet (500 mg total) by mouth 4 (four) times daily. 60 tablet 0  . neomycin-polymyxin-hydrocortisone (CORTISPORIN) 3.5-10000-1 otic suspension Place 4 drops into the right ear 3 (three) times daily.    . [DISCONTINUED] LISINOPRIL PO Take 1 tablet by mouth daily.     No current facility-administered medications for this visit.       Objective:    BP 150/76 mmHg  Pulse 81  Temp(Src) 97.9 F (36.6 C) (Oral)  Resp 16  Ht 5' 1.5" (1.562 m)  Wt 211 lb 6.4 oz (95.89 kg)  BMI 39.30 kg/m2  SpO2 100% Physical Exam  Constitutional: She is oriented to person, place, and time. She appears well-developed and well-nourished. No distress.  obese    HENT:  Head: Normocephalic and atraumatic.  Right Ear: External ear normal.  Left Ear: External ear normal.  Nose: Nose normal.  Mouth/Throat: Oropharynx is clear and moist.  Eyes: Conjunctivae and EOM are normal. Pupils are equal, round, and reactive to light.  Neck: Normal range of motion. Neck supple. Carotid bruit is not present. No thyromegaly present.  Cardiovascular: Normal rate, regular rhythm, normal heart sounds and intact distal pulses.  Exam reveals no gallop and no friction rub.   No murmur heard. Pulmonary/Chest: Effort normal and breath sounds normal. She has no wheezes. She has no rales.  Abdominal: Soft. Bowel sounds are normal. She exhibits no distension and no mass. There is no tenderness. There is no rebound and no guarding.  Musculoskeletal:       Lumbar back: She exhibits normal range of motion,  no tenderness, no bony tenderness, no pain, no spasm and normal pulse.  Lumbar spine:  Non-tender midline; non-tender paraspinal regions B.  Straight leg raises negative B; toe and heel walking intact; marching intact; motor 5/5 BLE.  Full ROM lumbar spine without limitation.   Lymphadenopathy:    She has no cervical adenopathy.  Neurological: She is alert and oriented to person, place, and time. No cranial nerve deficit.  Skin: Skin is warm and dry. No rash noted. She is not diaphoretic. No erythema. No pallor.  Psychiatric: She has a normal mood and affect. Her behavior is normal.        Assessment & Plan:   1. Diabetes mellitus without complication   2. Pure hypercholesterolemia   3. Essential hypertension, benign   4. Lumbar back pain with radiculopathy affecting left lower extremity   5. Cancer of central portion of left female breast     1. DMII: worsening due to weight gain, lack of exercise.  Obtain labs; will send in rx for lancets and test strips; recommend checking sugars daily. 2.  Hypercholesterolemia: uncontrolled due to non-compliance; recommend  switching statin to morning to improve compliance; recommend weight loss, exercise, dietary modification. 3.  HTN: worsening; increase Lisinopril/HCTZ to one whole tablet daily. 4.  Lumbar pain with L radiculopathy: New. S/p xrays by chiropractor; obtain results.  Rx for Robaxin provided; home exercise program provided to perform daily; refer to ortho. 5.  Breast cancer L: stable; maintained on Femara. 6. Obesity: worsening; highly recommend exercise, weight loss, low-fat food choices.   Meds ordered this encounter  Medications  . Cholecalciferol (VITAMIN D-3 PO)    Sig: Take 400 mg by mouth daily.  Marland Kitchen OVER THE COUNTER MEDICATION    Sig: daily.  . Turmeric 500 MG CAPS    Sig: Take 500 mg by mouth daily.  . methocarbamol (ROBAXIN) 500 MG tablet    Sig: Take 1 tablet (500 mg total) by mouth 4 (four) times daily.    Dispense:  60 tablet    Refill:  0    Return in about 3 months (around 09/20/2014) for recheck blood pressure, sugar.    Alexxis Mackert Elayne Guerin, M.D. [facility] [address] [place],   [address] [phone] phone [phone] fax

## 2014-06-21 NOTE — Patient Instructions (Addendum)
1. Increase Lisinopril to one whole tablet daily. 2. Check sugar once daily. 3. Check blood pressure once every other day.  Sciatica with Rehab The sciatic nerve runs from the back down the leg and is responsible for sensation and control of the muscles in the back (posterior) side of the thigh, lower leg, and foot. Sciatica is a condition that is characterized by inflammation of this nerve.  SYMPTOMS   Signs of nerve damage, including numbness and/or weakness along the posterior side of the lower extremity.  Pain in the back of the thigh that may also travel down the leg.  Pain that worsens when sitting for long periods of time.  Occasionally, pain in the back or buttock. CAUSES  Inflammation of the sciatic nerve is the cause of sciatica. The inflammation is due to something irritating the nerve. Common sources of irritation include:  Sitting for long periods of time.  Direct trauma to the nerve.  Arthritis of the spine.  Herniated or ruptured disk.  Slipping of the vertebrae (spondylolisthesis).  Pressure from soft tissues, such as muscles or ligament-like tissue (fascia). RISK INCREASES WITH:  Sports that place pressure or stress on the spine (football or weightlifting).  Poor strength and flexibility.  Failure to warm up properly before activity.  Family history of low back pain or disk disorders.  Previous back injury or surgery.  Poor body mechanics, especially when lifting, or poor posture. PREVENTION   Warm up and stretch properly before activity.  Maintain physical fitness:  Strength, flexibility, and endurance.  Cardiovascular fitness.  Learn and use proper technique, especially with posture and lifting. When possible, have coach correct improper technique.  Avoid activities that place stress on the spine. PROGNOSIS If treated properly, then sciatica usually resolves within 6 weeks. However, occasionally surgery is necessary.  RELATED COMPLICATIONS     Permanent nerve damage, including pain, numbness, tingle, or weakness.  Chronic back pain.  Risks of surgery: infection, bleeding, nerve damage, or damage to surrounding tissues. TREATMENT Treatment initially involves resting from any activities that aggravate your symptoms. The use of ice and medication may help reduce pain and inflammation. The use of strengthening and stretching exercises may help reduce pain with activity. These exercises may be performed at home or with referral to a therapist. A therapist may recommend further treatments, such as transcutaneous electronic nerve stimulation (TENS) or ultrasound. Your caregiver may recommend corticosteroid injections to help reduce inflammation of the sciatic nerve. If symptoms persist despite non-surgical (conservative) treatment, then surgery may be recommended. MEDICATION  If pain medication is necessary, then nonsteroidal anti-inflammatory medications, such as aspirin and ibuprofen, or other minor pain relievers, such as acetaminophen, are often recommended.  Do not take pain medication for 7 days before surgery.  Prescription pain relievers may be given if deemed necessary by your caregiver. Use only as directed and only as much as you need.  Ointments applied to the skin may be helpful.  Corticosteroid injections may be given by your caregiver. These injections should be reserved for the most serious cases, because they may only be given a certain number of times. HEAT AND COLD  Cold treatment (icing) relieves pain and reduces inflammation. Cold treatment should be applied for 10 to 15 minutes every 2 to 3 hours for inflammation and pain and immediately after any activity that aggravates your symptoms. Use ice packs or massage the area with a piece of ice (ice massage).  Heat treatment may be used prior to performing the stretching  and strengthening activities prescribed by your caregiver, physical therapist, or athletic trainer.  Use a heat pack or soak the injury in warm water. SEEK MEDICAL CARE IF:  Treatment seems to offer no benefit, or the condition worsens.  Any medications produce adverse side effects. EXERCISES  RANGE OF MOTION (ROM) AND STRETCHING EXERCISES - Sciatica Most people with sciatic will find that their symptoms worsen with either excessive bending forward (flexion) or arching at the low back (extension). The exercises which will help resolve your symptoms will focus on the opposite motion. Your physician, physical therapist or athletic trainer will help you determine which exercises will be most helpful to resolve your low back pain. Do not complete any exercises without first consulting with your clinician. Discontinue any exercises which worsen your symptoms until you speak to your clinician. If you have pain, numbness or tingling which travels down into your buttocks, leg or foot, the goal of the therapy is for these symptoms to move closer to your back and eventually resolve. Occasionally, these leg symptoms will get better, but your low back pain may worsen; this is typically an indication of progress in your rehabilitation. Be certain to be very alert to any changes in your symptoms and the activities in which you participated in the 24 hours prior to the change. Sharing this information with your clinician will allow him/her to most efficiently treat your condition. These exercises may help you when beginning to rehabilitate your injury. Your symptoms may resolve with or without further involvement from your physician, physical therapist or athletic trainer. While completing these exercises, remember:   Restoring tissue flexibility helps normal motion to return to the joints. This allows healthier, less painful movement and activity.  An effective stretch should be held for at least 30 seconds.  A stretch should never be painful. You should only feel a gentle lengthening or release in the stretched  tissue. FLEXION RANGE OF MOTION AND STRETCHING EXERCISES: STRETCH - Flexion, Single Knee to Chest   Lie on a firm bed or floor with both legs extended in front of you.  Keeping one leg in contact with the floor, bring your opposite knee to your chest. Hold your leg in place by either grabbing behind your thigh or at your knee.  Pull until you feel a gentle stretch in your low back. Hold __________ seconds.  Slowly release your grasp and repeat the exercise with the opposite side. Repeat __________ times. Complete this exercise __________ times per day.  STRETCH - Flexion, Double Knee to Chest  Lie on a firm bed or floor with both legs extended in front of you.  Keeping one leg in contact with the floor, bring your opposite knee to your chest.  Tense your stomach muscles to support your back and then lift your other knee to your chest. Hold your legs in place by either grabbing behind your thighs or at your knees.  Pull both knees toward your chest until you feel a gentle stretch in your low back. Hold __________ seconds.  Tense your stomach muscles and slowly return one leg at a time to the floor. Repeat __________ times. Complete this exercise __________ times per day.  STRETCH - Low Trunk Rotation   Lie on a firm bed or floor. Keeping your legs in front of you, bend your knees so they are both pointed toward the ceiling and your feet are flat on the floor.  Extend your arms out to the side. This will stabilize your  upper body by keeping your shoulders in contact with the floor.  Gently and slowly drop both knees together to one side until you feel a gentle stretch in your low back. Hold for __________ seconds.  Tense your stomach muscles to support your low back as you bring your knees back to the starting position. Repeat the exercise to the other side. Repeat __________ times. Complete this exercise __________ times per day  EXTENSION RANGE OF MOTION AND FLEXIBILITY  EXERCISES: STRETCH - Extension, Prone on Elbows  Lie on your stomach on the floor, a bed will be too soft. Place your palms about shoulder width apart and at the height of your head.  Place your elbows under your shoulders. If this is too painful, stack pillows under your chest.  Allow your body to relax so that your hips drop lower and make contact more completely with the floor.  Hold this position for __________ seconds.  Slowly return to lying flat on the floor. Repeat __________ times. Complete this exercise __________ times per day.  RANGE OF MOTION - Extension, Prone Press Ups  Lie on your stomach on the floor, a bed will be too soft. Place your palms about shoulder width apart and at the height of your head.  Keeping your back as relaxed as possible, slowly straighten your elbows while keeping your hips on the floor. You may adjust the placement of your hands to maximize your comfort. As you gain motion, your hands will come more underneath your shoulders.  Hold this position __________ seconds.  Slowly return to lying flat on the floor. Repeat __________ times. Complete this exercise __________ times per day.  STRENGTHENING EXERCISES - Sciatica  These exercises may help you when beginning to rehabilitate your injury. These exercises should be done near your "sweet spot." This is the neutral, low-back arch, somewhere between fully rounded and fully arched, that is your least painful position. When performed in this safe range of motion, these exercises can be used for people who have either a flexion or extension based injury. These exercises may resolve your symptoms with or without further involvement from your physician, physical therapist or athletic trainer. While completing these exercises, remember:   Muscles can gain both the endurance and the strength needed for everyday activities through controlled exercises.  Complete these exercises as instructed by your physician,  physical therapist or athletic trainer. Progress with the resistance and repetition exercises only as your caregiver advises.  You may experience muscle soreness or fatigue, but the pain or discomfort you are trying to eliminate should never worsen during these exercises. If this pain does worsen, stop and make certain you are following the directions exactly. If the pain is still present after adjustments, discontinue the exercise until you can discuss the trouble with your clinician. STRENGTHENING - Deep Abdominals, Pelvic Tilt   Lie on a firm bed or floor. Keeping your legs in front of you, bend your knees so they are both pointed toward the ceiling and your feet are flat on the floor.  Tense your lower abdominal muscles to press your low back into the floor. This motion will rotate your pelvis so that your tail bone is scooping upwards rather than pointing at your feet or into the floor.  With a gentle tension and even breathing, hold this position for __________ seconds. Repeat __________ times. Complete this exercise __________ times per day.  STRENGTHENING - Abdominals, Crunches   Lie on a firm bed or floor. Keeping your  legs in front of you, bend your knees so they are both pointed toward the ceiling and your feet are flat on the floor. Cross your arms over your chest.  Slightly tip your chin down without bending your neck.  Tense your abdominals and slowly lift your trunk high enough to just clear your shoulder blades. Lifting higher can put excessive stress on the low back and does not further strengthen your abdominal muscles.  Control your return to the starting position. Repeat __________ times. Complete this exercise __________ times per day.  STRENGTHENING - Quadruped, Opposite UE/LE Lift  Assume a hands and knees position on a firm surface. Keep your hands under your shoulders and your knees under your hips. You may place padding under your knees for comfort.  Find your  neutral spine and gently tense your abdominal muscles so that you can maintain this position. Your shoulders and hips should form a rectangle that is parallel with the floor and is not twisted.  Keeping your trunk steady, lift your right hand no higher than your shoulder and then your left leg no higher than your hip. Make sure you are not holding your breath. Hold this position __________ seconds.  Continuing to keep your abdominal muscles tense and your back steady, slowly return to your starting position. Repeat with the opposite arm and leg. Repeat __________ times. Complete this exercise __________ times per day.  STRENGTHENING - Abdominals and Quadriceps, Straight Leg Raise   Lie on a firm bed or floor with both legs extended in front of you.  Keeping one leg in contact with the floor, bend the other knee so that your foot can rest flat on the floor.  Find your neutral spine, and tense your abdominal muscles to maintain your spinal position throughout the exercise.  Slowly lift your straight leg off the floor about 6 inches for a count of 15, making sure to not hold your breath.  Still keeping your neutral spine, slowly lower your leg all the way to the floor. Repeat this exercise with each leg __________ times. Complete this exercise __________ times per day. POSTURE AND BODY MECHANICS CONSIDERATIONS - Sciatica Keeping correct posture when sitting, standing or completing your activities will reduce the stress put on different body tissues, allowing injured tissues a chance to heal and limiting painful experiences. The following are general guidelines for improved posture. Your physician or physical therapist will provide you with any instructions specific to your needs. While reading these guidelines, remember:  The exercises prescribed by your provider will help you have the flexibility and strength to maintain correct postures.  The correct posture provides the optimal environment for  your joints to work. All of your joints have less wear and tear when properly supported by a spine with good posture. This means you will experience a healthier, less painful body.  Correct posture must be practiced with all of your activities, especially prolonged sitting and standing. Correct posture is as important when doing repetitive low-stress activities (typing) as it is when doing a single heavy-load activity (lifting). RESTING POSITIONS Consider which positions are most painful for you when choosing a resting position. If you have pain with flexion-based activities (sitting, bending, stooping, squatting), choose a position that allows you to rest in a less flexed posture. You would want to avoid curling into a fetal position on your side. If your pain worsens with extension-based activities (prolonged standing, working overhead), avoid resting in an extended position such as sleeping on your  stomach. Most people will find more comfort when they rest with their spine in a more neutral position, neither too rounded nor too arched. Lying on a non-sagging bed on your side with a pillow between your knees, or on your back with a pillow under your knees will often provide some relief. Keep in mind, being in any one position for a prolonged period of time, no matter how correct your posture, can still lead to stiffness. PROPER SITTING POSTURE In order to minimize stress and discomfort on your spine, you must sit with correct posture Sitting with good posture should be effortless for a healthy body. Returning to good posture is a gradual process. Many people can work toward this most comfortably by using various supports until they have the flexibility and strength to maintain this posture on their own. When sitting with proper posture, your ears will fall over your shoulders and your shoulders will fall over your hips. You should use the back of the chair to support your upper back. Your low back will be  in a neutral position, just slightly arched. You may place a small pillow or folded towel at the base of your low back for support.  When working at a desk, create an environment that supports good, upright posture. Without extra support, muscles fatigue and lead to excessive strain on joints and other tissues. Keep these recommendations in mind: CHAIR:   A chair should be able to slide under your desk when your back makes contact with the back of the chair. This allows you to work closely.  The chair's height should allow your eyes to be level with the upper part of your monitor and your hands to be slightly lower than your elbows. BODY POSITION  Your feet should make contact with the floor. If this is not possible, use a foot rest.  Keep your ears over your shoulders. This will reduce stress on your neck and low back. INCORRECT SITTING POSTURES   If you are feeling tired and unable to assume a healthy sitting posture, do not slouch or slump. This puts excessive strain on your back tissues, causing more damage and pain. Healthier options include:  Using more support, like a lumbar pillow.  Switching tasks to something that requires you to be upright or walking.  Talking a brief walk.  Lying down to rest in a neutral-spine position. PROLONGED STANDING WHILE SLIGHTLY LEANING FORWARD  When completing a task that requires you to lean forward while standing in one place for a long time, place either foot up on a stationary 2-4 inch high object to help maintain the best posture. When both feet are on the ground, the low back tends to lose its slight inward curve. If this curve flattens (or becomes too large), then the back and your other joints will experience too much stress, fatigue more quickly and can cause pain.  CORRECT STANDING POSTURES Proper standing posture should be assumed with all daily activities, even if they only take a few moments, like when brushing your teeth. As in sitting,  your ears should fall over your shoulders and your shoulders should fall over your hips. You should keep a slight tension in your abdominal muscles to brace your spine. Your tailbone should point down to the ground, not behind your body, resulting in an over-extended swayback posture.  INCORRECT STANDING POSTURES  Common incorrect standing postures include a forward head, locked knees and/or an excessive swayback. WALKING Walk with an upright posture. Your  ears, shoulders and hips should all line-up. PROLONGED ACTIVITY IN A FLEXED POSITION When completing a task that requires you to bend forward at your waist or lean over a low surface, try to find a way to stabilize 3 of 4 of your limbs. You can place a hand or elbow on your thigh or rest a knee on the surface you are reaching across. This will provide you more stability so that your muscles do not fatigue as quickly. By keeping your knees relaxed, or slightly bent, you will also reduce stress across your low back. CORRECT LIFTING TECHNIQUES DO :   Assume a wide stance. This will provide you more stability and the opportunity to get as close as possible to the object which you are lifting.  Tense your abdominals to brace your spine; then bend at the knees and hips. Keeping your back locked in a neutral-spine position, lift using your leg muscles. Lift with your legs, keeping your back straight.  Test the weight of unknown objects before attempting to lift them.  Try to keep your elbows locked down at your sides in order get the best strength from your shoulders when carrying an object.  Always ask for help when lifting heavy or awkward objects. INCORRECT LIFTING TECHNIQUES DO NOT:   Lock your knees when lifting, even if it is a small object.  Bend and twist. Pivot at your feet or move your feet when needing to change directions.  Assume that you cannot safely pick up a paperclip without proper posture. Document Released: 03/05/2005  Document Revised: 07/20/2013 Document Reviewed: 06/17/2008 [employer] [facility] Patient Information 2015 [place], [place]. This information is not intended to replace advice given to you by your health care provider. Make sure you discuss any questions you have with your health care provider.

## 2014-06-22 ENCOUNTER — Telehealth: Payer: Self-pay | Admitting: Family Medicine

## 2014-06-22 DIAGNOSIS — E669 Obesity, unspecified: Secondary | ICD-10-CM | POA: Insufficient documentation

## 2014-06-22 MED ORDER — LANCETS MISC: 1.0000 | Freq: Every day | Status: DC

## 2014-06-22 MED ORDER — BLOOD GLUCOSE MONITOR KIT: PACK | Status: DC

## 2014-06-22 MED ORDER — LANCETS MISC: Status: DC

## 2014-06-22 MED ORDER — LISINOPRIL-HYDROCHLOROTHIAZIDE 10-12.5 MG PO TABS: ORAL_TABLET | ORAL | Status: DC

## 2014-06-22 MED ORDER — BLOOD GLUCOSE TEST VI STRP: ORAL_STRIP | Status: DC

## 2014-06-22 NOTE — Telephone Encounter (Signed)
Please send in rx for the following:  1.  Lancets check sugar once daily #100 3 refills.  2. Test strips check sugars once daily #100 3 refills Dx: DMII controlled without complications (W29.9).

## 2014-06-22 NOTE — Telephone Encounter (Signed)
Pharm also called to advise that Medicare requires [place] to send DM supplies electronically or by fax and has to have specific brand, sig and Dx code. Called pt to get info concerning brand of meter. LMOM to CB. Also get details of what is needed for lisinopril (Rx was sent in for 1 tab QD #90 w/a RF)

## 2014-06-22 NOTE — Telephone Encounter (Signed)
Patient stated she still have some of the half tablets of Lisinopril 10-12.5 MG. Patient want to request another refill of the medication. Patient want to discuss more diabetic supplies. She want to know if the supplies was called in at [facility]? Please call patient at home [phone]

## 2014-06-22 NOTE — Telephone Encounter (Signed)
Pt cb and she reported that she has an older [facility] meter and hasn't needed to use it for a while and has not gotten any supplies w/current ins. She did not know whether [employer] or Medicare covers DM supplies. Called pharm and they stated they go through [place] covers Accu-chek and One Touch. Sent in new Rxs for supplies and meter.

## 2014-06-23 ENCOUNTER — Other Ambulatory Visit: Payer: Self-pay

## 2014-06-23 ENCOUNTER — Other Ambulatory Visit: Payer: Self-pay | Admitting: Family Medicine

## 2014-06-23 ENCOUNTER — Telehealth: Payer: Self-pay

## 2014-06-23 MED ORDER — LANCETS MISC: Status: DC

## 2014-06-23 MED ORDER — BLOOD GLUCOSE MONITOR KIT: PACK | Status: DC

## 2014-06-23 MED ORDER — BLOOD GLUCOSE TEST VI STRP: ORAL_STRIP | Status: DC

## 2014-06-23 NOTE — Telephone Encounter (Signed)
Pharm faxed message that they can not accept script that gives pt choice between 2 meters and supplies, they need a specific brand. Called pt to ask which she wanted and she asked me to send in as One Touch, but if she doesn't like it when she goes to get it she will have them call me to change.

## 2014-06-23 NOTE — Telephone Encounter (Signed)
Robaxin 500 mg approved until 03/19/2015. Faxed to pharm.

## 2014-06-23 NOTE — Telephone Encounter (Signed)
PA for Methacarbamol 500mg  approved through cover my meds.

## 2014-06-24 ENCOUNTER — Encounter: Payer: Self-pay | Admitting: *Deleted

## 2014-06-24 ENCOUNTER — Other Ambulatory Visit (HOSPITAL_BASED_OUTPATIENT_CLINIC_OR_DEPARTMENT_OTHER): Payer: Medicare Other

## 2014-06-24 ENCOUNTER — Telehealth: Payer: Self-pay | Admitting: Hematology and Oncology

## 2014-06-24 ENCOUNTER — Ambulatory Visit (HOSPITAL_BASED_OUTPATIENT_CLINIC_OR_DEPARTMENT_OTHER): Payer: Medicare Other | Admitting: Hematology and Oncology

## 2014-06-24 VITALS — BP 157/76 | HR 81 | Temp 97.5°F | Resp 18 | Ht 61.5 in | Wt 213.0 lb

## 2014-06-24 DIAGNOSIS — Z170 Estrogen receptor positive status [ER+]: Secondary | ICD-10-CM | POA: Diagnosis not present

## 2014-06-24 DIAGNOSIS — C50112 Malignant neoplasm of central portion of left female breast: Secondary | ICD-10-CM | POA: Diagnosis not present

## 2014-06-24 DIAGNOSIS — C50119 Malignant neoplasm of central portion of unspecified female breast: Secondary | ICD-10-CM | POA: Diagnosis not present

## 2014-06-24 LAB — CBC WITH DIFFERENTIAL/PLATELET
BASO%: 0.3 % (ref 0.0–2.0)
BASOS ABS: 0 10*3/uL (ref 0.0–0.1)
EOS%: 5.6 % (ref 0.0–7.0)
Eosinophils Absolute: 0.3 10*3/uL (ref 0.0–0.5)
HCT: 39.6 % (ref 34.8–46.6)
HGB: 13.4 g/dL (ref 11.6–15.9)
LYMPH%: 21.2 % (ref 14.0–49.7)
MCH: 29.2 pg (ref 25.1–34.0)
MCHC: 33.8 g/dL (ref 31.5–36.0)
MCV: 86.3 fL (ref 79.5–101.0)
MONO#: 0.5 10*3/uL (ref 0.1–0.9)
MONO%: 8.1 % (ref 0.0–14.0)
NEUT%: 64.8 % (ref 38.4–76.8)
NEUTROS ABS: 3.9 10*3/uL (ref 1.5–6.5)
Platelets: 225 10*3/uL (ref 145–400)
RBC: 4.59 10*6/uL (ref 3.70–5.45)
RDW: 13.1 % (ref 11.2–14.5)
WBC: 6 10*3/uL (ref 3.9–10.3)
lymph#: 1.3 10*3/uL (ref 0.9–3.3)

## 2014-06-24 LAB — COMPREHENSIVE METABOLIC PANEL (CC13)
ALK PHOS: 157 U/L — AB (ref 40–150)
ALT: 19 U/L (ref 0–55)
AST: 16 U/L (ref 5–34)
Albumin: 3.8 g/dL (ref 3.5–5.0)
Anion Gap: 9 mEq/L (ref 3–11)
BUN: 14.3 mg/dL (ref 7.0–26.0)
CALCIUM: 9.8 mg/dL (ref 8.4–10.4)
CHLORIDE: 102 meq/L (ref 98–109)
CO2: 25 mEq/L (ref 22–29)
CREATININE: 0.8 mg/dL (ref 0.6–1.1)
EGFR: 81 mL/min/{1.73_m2} — ABNORMAL LOW (ref >90)
Glucose: 164 mg/dl — ABNORMAL HIGH (ref 70–140)
Potassium: 4.3 mEq/L (ref 3.5–5.1)
Sodium: 136 mEq/L (ref 136–145)
Total Bilirubin: 0.45 mg/dL (ref 0.20–1.20)
Total Protein: 7.2 g/dL (ref 6.4–8.3)

## 2014-06-24 NOTE — Progress Notes (Signed)
Patient Care Team: [name], MD as PCP - General (Family Medicine) [name], MD (Family Medicine)  DIAGNOSIS: Cancer of central portion of left female breast   Staging form: Breast, AJCC 7th Edition     Clinical: Stage IA (T1c, N0, cM0) - Unsigned       Staging comments: Staged at breast conference 6.11.14      Pathologic: No stage assigned - Unsigned   SUMMARY OF ONCOLOGIC HISTORY:   Cancer of central portion of left female breast   08/13/2012 Mammogram Questionable mass deep to the nipple superior periareolar region with calcifications.U/S 5 x 8 mm mass at 12:00, ovoid cystic lesion at 11:00 1.5X 0.4 cm   08/25/2012 Initial Diagnosis Invasive mammary cancer with mammary cancer in situ grade 2 with lobular features ER 100%, PR 63%, Ki-67 58%, HER-2 positive ratio 2.48   08/26/2012 Breast MRI Dominant, biopsied mass with small immediately adjacent satellite mass consistent with biopsy results indicating malignancy, measuring 17 x 11 x 44m   09/03/2012 Surgery Left breast lumpectomy invasive lobular cancer with lymphovascular invasion and lobular carcinoma in situ positive margins one SLN negative, grade 2; 1.6 cm, ER 100%, PR 63%, HER-2 positive, Ki-67 58% T1 C. N0 M0 stage IA   10/21/2012 - 11/28/2012 Radiation Therapy Radiation therapy to the lumpectomy site   12/01/2012 -  Anti-estrogen oral therapy Arimidex 1 mg daily planned duration of treatment 5 years, switched to letrozole January 2016 for musculoskeletal aches and pains    CHIEF COMPLIANT: follow-up of breast cancer on letrozole  INTERVAL HISTORY: Diane MULLis a 69year old with above-mentioned history of left breast cancer treated with lumpectomy and radiation is currently on letrozole and is tolerating this better than Arimidex. Although she continues to have low back pain and numbness in the left leg that radiates down the leg. Other than that she denies any other problems or concerns. She had a bone density test 2  years ago and will schedule another onewith her primary care physician.  REVIEW OF SYSTEMS:   Constitutional: Denies fevers, chills or abnormal weight loss Eyes: Denies blurriness of vision Ears, nose, mouth, throat, and face: Denies mucositis or sore throat Respiratory: Denies cough, dyspnea or wheezes Cardiovascular: Denies palpitation, chest discomfort or lower extremity swelling Gastrointestinal:  Denies nausea, heartburn or change in bowel habits Skin: Denies abnormal skin rashes Lymphatics: Denies new lymphadenopathy or easy bruising Neurological:Denies numbness, tingling or new weaknesses Behavioral/Psych: Mood is stable, no new changes  Breast:  denies any pain or lumps or nodules in either breasts All other systems were reviewed with the patient and are negative.  I have reviewed the past medical history, past surgical history, social history and family history with the patient and they are unchanged from previous note.  ALLERGIES:  is allergic to latex and adhesive.  MEDICATIONS:  Current Outpatient Prescriptions  Medication Sig Dispense Refill  . aspirin 81 MG tablet Take 81 mg by mouth daily.    . blood glucose meter kit and supplies KIT Test blood sugar once daily. Dx code: E11.9 1 each 0  . Cholecalciferol (VITAMIN D-3 PO) Take 400 mg by mouth daily.    . Cyanocobalamin (VITAMIN B-12 CR PO) Take 500 mcg by mouth daily.     . fluticasone (FLONASE) 50 MCG/ACT nasal spray Place 2 sprays into both nostrils daily. As needed 16 g 11  . Glucose Blood (BLOOD GLUCOSE TEST STRIPS) STRP Test blood sugar once daily. Dx code: E11.9 100 each 3  .  Lancets MISC Check sugar once daily. Dx code: E11.9 100 each 3  . letrozole (FEMARA) 2.5 MG tablet Take 1 tablet (2.5 mg total) by mouth daily. 90 tablet 3  . lisinopril-hydrochlorothiazide (PRINZIDE,ZESTORETIC) 10-12.5 MG per tablet TAKE ONE TABLET BY MOUTH ONCE DAILY. 90 tablet 1  . Loratadine-Pseudoephedrine (PX ALLERGY RELIEF D, LORATID,  PO) Take by mouth as needed.    . lovastatin (MEVACOR) 20 MG tablet Take 1 tablet (20 mg total) by mouth at bedtime. 90 tablet 3  . methocarbamol (ROBAXIN) 500 MG tablet Take 1 tablet (500 mg total) by mouth 4 (four) times daily. 60 tablet 0  . Omega-3 Fatty Acids (FISH OIL) 1000 MG CAPS Take 1 capsule by mouth daily.    Marland Kitchen OVER THE COUNTER MEDICATION daily.    . Turmeric 500 MG CAPS Take 500 mg by mouth daily.    . vitamin C (ASCORBIC ACID) 500 MG tablet Take 1,000 mg by mouth daily.     . Zinc 50 MG TABS Take 1 tablet by mouth daily.    . [DISCONTINUED] LISINOPRIL PO Take 1 tablet by mouth daily.     No current facility-administered medications for this visit.    PHYSICAL EXAMINATION: ECOG PERFORMANCE STATUS: 1 - Symptomatic but completely ambulatory  Filed Vitals:   06/24/14 0818  BP: 157/76  Pulse: 81  Temp: 97.5 F (36.4 C)  Resp: 18   Filed Weights   06/24/14 0818  Weight: 213 lb (96.616 kg)    GENERAL:alert, no distress and comfortable SKIN: skin color, texture, turgor are normal, no rashes or significant lesions EYES: normal, Conjunctiva are pink and non-injected, sclera clear OROPHARYNX:no exudate, no erythema and lips, buccal mucosa, and tongue normal  NECK: supple, thyroid normal size, non-tender, without nodularity LYMPH:  no palpable lymphadenopathy in the cervical, axillary or inguinal LUNGS: clear to auscultation and percussion with normal breathing effort HEART: regular rate & rhythm and no murmurs and no lower extremity edema ABDOMEN:abdomen soft, non-tender and normal bowel sounds Musculoskeletal:no cyanosis of digits and no clubbing  NEURO: alert & oriented x 3 with fluent speech, no focal motor/sensory deficits BREAST: No palpable masses or nodules in either right or left breasts. No palpable axillary supraclavicular or infraclavicular adenopathy no breast tenderness or nipple discharge. (exam performed in the presence of a chaperone)  LABORATORY DATA:  I  have reviewed the data as listed   Chemistry      Component Value Date/Time   NA 137 06/21/2014 1147   NA 140 12/17/2013 0933   K 4.6 06/21/2014 1147   K 4.4 12/17/2013 0933   CL 100 06/21/2014 1147   CL 104 08/27/2012 1219   CO2 28 06/21/2014 1147   CO2 28 12/17/2013 0933   BUN 12 06/21/2014 1147   BUN 14.1 12/17/2013 0933   CREATININE 0.67 06/21/2014 1147   CREATININE 0.8 12/17/2013 0933   CREATININE 0.72 05/13/2013 1430      Component Value Date/Time   CALCIUM 10.0 06/21/2014 1147   CALCIUM 10.5* 12/17/2013 0933   ALKPHOS 136* 06/21/2014 1147   ALKPHOS 128 12/17/2013 0933   AST 18 06/21/2014 1147   AST 20 12/17/2013 0933   ALT 19 06/21/2014 1147   ALT 18 12/17/2013 0933   BILITOT 0.6 06/21/2014 1147   BILITOT 0.93 12/17/2013 0933       Lab Results  Component Value Date   WBC 6.0 06/24/2014   HGB 13.4 06/24/2014   HCT 39.6 06/24/2014   MCV 86.3 06/24/2014  PLT 225 06/24/2014   NEUTROABS 3.9 06/24/2014     RADIOGRAPHIC STUDIES: I have personally reviewed the radiology reports and agreed with their findings. Mammogram June 2015 is normal  ASSESSMENT & PLAN:  Cancer of central portion of left female breast Left breast invasive lobular cancer ER/PR positive HER-2 negative (initial biopsy showed HER-2 positive but final pathology on the lumpectomy did not) status post left breast lumpectomy and radiation and currently on antiestrogen therapy with Arimidex started 12/01/2012.  Arimidex toxicities: Patient is tolerating Arimidex fairly well except for muscle aches and pains. She is taking meloxicam for pain. She reports that Claritin-D helped with the symptoms.  Breast cancer surveillance: 1. Breast exam 06/24/2014 is normal 2. Mammogram 09/29/2013 is normal  Hypercalcemia: She has had episodic hypercalcemia. PTH level is pending from today. I instructed her to follow with her primary care physician regarding this issue. Since we stopped calcium supplementation,  her calcium levels have come down. Last week her calcium level was at 10.  Return to clinic in 6 months for followup.     No orders of the defined types were placed in this encounter.   The patient has a good understanding of the overall plan. she agrees with it. She will call with any problems that may develop before her next visit here.   Rulon Eisenmenger, MD

## 2014-06-24 NOTE — Assessment & Plan Note (Signed)
Left breast invasive lobular cancer ER/PR positive HER-2 negative (initial biopsy showed HER-2 positive but final pathology on the lumpectomy did not) status post left breast lumpectomy and radiation and currently on antiestrogen therapy with Arimidex started 12/01/2012.  Arimidex toxicities: Patient is tolerating Arimidex fairly well except for muscle aches and pains. She is taking meloxicam for pain. She reports that Claritin-D helped with the symptoms.  Breast cancer surveillance: 1. Breast exam 06/24/2014 is normal 2. Mammogram 09/29/2013 is normal  Hypercalcemia: She has had episodic hypercalcemia. PTH level is pending from today. I instructed her to follow with her primary care physician regarding this issue.   Return to clinic in 6 months for followup.

## 2014-06-24 NOTE — Telephone Encounter (Signed)
per pof to sch pt appt-gave pt copy of sch °

## 2014-06-24 NOTE — [facility] Oncology Navigator Note (Signed)
Patient at [facility], accompanied by her husband, for a follow up visit.  Patient reports that she is doing well.  She continues with low back and leg pain and her PCP has sent a referral to Dr. Lynann Bologna for evaluation.  I gave her information about classes, resources and events offered through [facility].  She denied any questions or concerns at this time.  I encouraged her to call me for any needs.

## 2014-06-25 LAB — PTH, INTACT AND CALCIUM
CALCIUM: 9.8 mg/dL (ref 8.4–10.5)
PTH: 52 pg/mL (ref 14–64)

## 2014-07-16 DIAGNOSIS — M7750 Other enthesopathy of unspecified foot: Secondary | ICD-10-CM | POA: Diagnosis not present

## 2014-07-22 DIAGNOSIS — M5136 Other intervertebral disc degeneration, lumbar region: Secondary | ICD-10-CM | POA: Diagnosis not present

## 2014-07-22 DIAGNOSIS — M7542 Impingement syndrome of left shoulder: Secondary | ICD-10-CM | POA: Diagnosis not present

## 2014-07-22 DIAGNOSIS — M7502 Adhesive capsulitis of left shoulder: Secondary | ICD-10-CM | POA: Diagnosis not present

## 2014-07-29 DIAGNOSIS — M5136 Other intervertebral disc degeneration, lumbar region: Secondary | ICD-10-CM | POA: Diagnosis not present

## 2014-08-06 ENCOUNTER — Telehealth: Payer: Self-pay | Admitting: *Deleted

## 2014-08-06 NOTE — Telephone Encounter (Signed)
Oncology Nurse Navigator Documentation  Oncology Nurse Navigator Flowsheets 08/06/2014  Navigator Encounter Type Telephone - patient called with concern that when she had her Letrozole refilled from another pharmacy, the pills looked different.  She looked them up on line and they were identified as Femara.  She called to make sure they were OK to take.  Patient Visit Type Medonc  Treatment Phase Treatment  Barriers/Navigation Needs Education - verified that the dosage was the same and confirmed that Femara is the brand name for [place].  Education Other  Interventions Education Method - encouraged her to call me back for any questions or concerns.  Education Method Teach-back  Time Spent with Patient 15

## 2014-08-06 NOTE — Telephone Encounter (Signed)
TC from pt this am regarding her prescription for Letrozole.  [place]ies [place] and got her [facility]rozole filled at [facility]. She states that the tablet looks different.  Reviewed label with pt and label states it is the same medicine as she has been taking.  Advised that different pharmacies use different  Manufacturers and that if she has questions about that to check with pharmacist @ [place]. She verbalized understanding.  No other needs identified.

## 2014-09-02 DIAGNOSIS — C50912 Malignant neoplasm of unspecified site of left female breast: Secondary | ICD-10-CM | POA: Diagnosis not present

## 2014-09-14 ENCOUNTER — Telehealth: Payer: Self-pay

## 2014-09-14 DIAGNOSIS — M8580 Other specified disorders of bone density and structure, unspecified site: Secondary | ICD-10-CM | POA: Diagnosis not present

## 2014-09-14 DIAGNOSIS — M8588 Other specified disorders of bone density and structure, other site: Secondary | ICD-10-CM | POA: Diagnosis not present

## 2014-09-14 DIAGNOSIS — N958 Other specified menopausal and perimenopausal disorders: Secondary | ICD-10-CM | POA: Diagnosis not present

## 2014-09-14 NOTE — Telephone Encounter (Signed)
Pt states she is on her way to Dr.Metzer's office in [place] and need to know when she had her last pap. Please call [phone]

## 2014-10-04 ENCOUNTER — Emergency Department (HOSPITAL_COMMUNITY)
Admission: EM | Admit: 2014-10-04 | Discharge: 2014-10-04 | Disposition: A | Discharge status: Home / Self Care | Payer: Medicare Other | Attending: Emergency Medicine | Admitting: Emergency Medicine

## 2014-10-04 ENCOUNTER — Encounter (HOSPITAL_COMMUNITY): Payer: Self-pay

## 2014-10-04 ENCOUNTER — Emergency Department (HOSPITAL_COMMUNITY): Payer: Medicare Other

## 2014-10-04 DIAGNOSIS — I10 Essential (primary) hypertension: Secondary | ICD-10-CM | POA: Diagnosis not present

## 2014-10-04 DIAGNOSIS — Z8742 Personal history of other diseases of the female genital tract: Secondary | ICD-10-CM | POA: Insufficient documentation

## 2014-10-04 DIAGNOSIS — Z853 Personal history of malignant neoplasm of breast: Secondary | ICD-10-CM | POA: Diagnosis not present

## 2014-10-04 DIAGNOSIS — S42211A Unspecified displaced fracture of surgical neck of right humerus, initial encounter for closed fracture: Secondary | ICD-10-CM | POA: Insufficient documentation

## 2014-10-04 DIAGNOSIS — Y929 Unspecified place or not applicable: Secondary | ICD-10-CM | POA: Diagnosis not present

## 2014-10-04 DIAGNOSIS — Y939 Activity, unspecified: Secondary | ICD-10-CM | POA: Insufficient documentation

## 2014-10-04 DIAGNOSIS — Y999 Unspecified external cause status: Secondary | ICD-10-CM | POA: Insufficient documentation

## 2014-10-04 DIAGNOSIS — Z7982 Long term (current) use of aspirin: Secondary | ICD-10-CM | POA: Insufficient documentation

## 2014-10-04 DIAGNOSIS — Z86018 Personal history of other benign neoplasm: Secondary | ICD-10-CM | POA: Insufficient documentation

## 2014-10-04 DIAGNOSIS — Z79899 Other long term (current) drug therapy: Secondary | ICD-10-CM | POA: Diagnosis not present

## 2014-10-04 DIAGNOSIS — G4730 Sleep apnea, unspecified: Secondary | ICD-10-CM | POA: Diagnosis not present

## 2014-10-04 DIAGNOSIS — S4991XA Unspecified injury of right shoulder and upper arm, initial encounter: Secondary | ICD-10-CM | POA: Diagnosis present

## 2014-10-04 DIAGNOSIS — Z91040 Latex allergy status: Secondary | ICD-10-CM | POA: Diagnosis not present

## 2014-10-04 DIAGNOSIS — S8991XA Unspecified injury of right lower leg, initial encounter: Secondary | ICD-10-CM | POA: Insufficient documentation

## 2014-10-04 DIAGNOSIS — E785 Hyperlipidemia, unspecified: Secondary | ICD-10-CM | POA: Diagnosis not present

## 2014-10-04 DIAGNOSIS — Z862 Personal history of diseases of the blood and blood-forming organs and certain disorders involving the immune mechanism: Secondary | ICD-10-CM | POA: Diagnosis not present

## 2014-10-04 DIAGNOSIS — M19042 Primary osteoarthritis, left hand: Secondary | ICD-10-CM | POA: Diagnosis not present

## 2014-10-04 DIAGNOSIS — M79601 Pain in right arm: Secondary | ICD-10-CM | POA: Diagnosis not present

## 2014-10-04 DIAGNOSIS — Z9981 Dependence on supplemental oxygen: Secondary | ICD-10-CM | POA: Diagnosis not present

## 2014-10-04 DIAGNOSIS — M19041 Primary osteoarthritis, right hand: Secondary | ICD-10-CM | POA: Diagnosis not present

## 2014-10-04 DIAGNOSIS — W19XXXA Unspecified fall, initial encounter: Secondary | ICD-10-CM

## 2014-10-04 DIAGNOSIS — S8001XA Contusion of right knee, initial encounter: Secondary | ICD-10-CM | POA: Diagnosis not present

## 2014-10-04 DIAGNOSIS — W010XXA Fall on same level from slipping, tripping and stumbling without subsequent striking against object, initial encounter: Secondary | ICD-10-CM | POA: Diagnosis not present

## 2014-10-04 DIAGNOSIS — M25561 Pain in right knee: Secondary | ICD-10-CM

## 2014-10-04 MED ORDER — HYDROCODONE-ACETAMINOPHEN 5-325 MG PO TABS
1.0000 | ORAL_TABLET | Freq: Once | ORAL | Status: AC
  Administered 2014-10-04: 1 via ORAL
  Filled 2014-10-04: qty 1

## 2014-10-04 MED ORDER — HYDROCODONE-ACETAMINOPHEN 5-325 MG PO TABS: 1.0000 | ORAL_TABLET | Freq: Four times a day (QID) | ORAL | Status: DC | PRN

## 2014-10-04 NOTE — ED Notes (Addendum)
Per EMS, Patient tripped over a stool and fell to the ground. Patient complains of right upper arm pain due to injury. Patient has no deformity or edema noted to the right arm. Patient reports pain with pressure on the right and is holding arm still with the left. Patient ambulated in the room. Patient has hx of left sided breast cancer. Patient is currently on an Estrogen blocker and reports bone density changes. Patient is alert and oriented x4. Denies hitting head. Vitals per EMS: 160/90, 120 HR, 98% on RA, 18 RR.

## 2014-10-04 NOTE — Discharge Instructions (Signed)
Humerus Fracture, Treated with Immobilization The humerus is the large bone in the upper arm. A broken (fractured) humerus is often treated by wearing a cast, splint, or sling (immobilization). This holds the broken pieces in place so they can heal.  HOME CARE  Put ice on the injured area.  Put ice in a plastic bag.  Place a towel between your skin and the bag.  Leave the ice on for 15-20 minutes, 03-04 times a day.  If you are given a cast:  Do not scratch the skin under the cast.  Check the skin around the cast every day. You may put lotion on any red or sore areas.  Keep the cast dry and clean.  If you are given a splint:  Wear the splint as told.  Keep the splint clean and dry.  Loosen the elastic around the splint if your fingers become numb, cold, tingle, or turn blue.  If you are given a sling:  Wear the sling as told.  Do not put pressure on any part of the cast or splint until it is fully hardened.  The cast or splint must be protected with a plastic bag during bathing. Do not lower the cast or splint into water.  Only take medicine as told by your doctor.  Do exercises as told by your doctor.  Follow up as told by your doctor. GET HELP RIGHT AWAY IF:   Your skin or fingernails turn blue or gray.  Your arm feels cold or numb.  You have very bad pain in the injured arm.  You are having problems with the medicines you were given. MAKE SURE YOU:   Understand these instructions.  Will watch your condition.  Will get help right away if you are not doing well or get worse. Document Released: 08/22/2007 Document Revised: 05/28/2011 Document Reviewed: 04/19/2010 [facility] Patient Information 2015 [place], [place]. This information is not intended to replace advice given to you by your health care provider. Make sure you discuss any questions you have with your health care provider.

## 2014-10-04 NOTE — ED Notes (Signed)
Patient returned to room from X-ray 

## 2014-10-04 NOTE — ED Provider Notes (Signed)
CSN: [id number]     Arrival date & time 10/04/14  1452 History  This chart was scribed for non-physician practitioner, Glendell Docker, NP, working with Leonard Schwartz, MD, by Helane Gunther ED Scribe. This patient was seen in room TR06C/TR06C and the patient's care was started at 2:56 PM   Chief Complaint  Patient presents with  . Arm Pain   The history is provided by the patient. No language interpreter was used.   HPI Comments: [name] is a 69 y.o. female brought in by ambulance, who presents to the Emergency Department complaining of right arm pain onset after a fall earlier today. Pt reports that she tripped over a stool and the next thing she remembers she was on the ground. She states that the pain is currently mostly in the right shoulder and radiating towards the back. Pt notes the arm feels bruised and the shoulder is painful "on the inside." She is taking estrogen pills. Pt also c/o right knee pain. She is able to bend her knee and walk. Pt denies taking blood thinners. She denies LOC.  Past Medical History  Diagnosis Date  . Postmenopausal bleeding   . Fibroids   . Headache(784.0)     tension  . Seasonal allergies   . Anemia     due to vaginal bleeding  . Urinary urgency   . Hypertension     under control with med., has been on med. x 3 yr.  . White coat hypertension   . History of endometriosis   . Breast cancer 08/2012    left  . Sleep apnea     uses CPAP nightly  . Arthritis     hands  . Dental crowns present   . Hyperlipidemia   . OSA (obstructive sleep apnea)   . S/P radiation therapy 10/20/2012-12/05/2012    left breast 50.4 gray, lumpectomy cavity boosted to 62.4 gray  . Allergy    Past Surgical History  Procedure Laterality Date  . Hysteroscopy w/d&c  05/06/2009  . Exploratory laparotomy  06-30-2007    ATTEMPTED HYSTERECTOMY ABORTED DUE TO EXTENSIVE ENDOMETRIOSIS W/ DENSE PELVIC ADHESIONS INVOLVING UTERUS AND LOWER RECTOSIGMOID  . Cholecystectomy   1995  . Colonoscopy w/ polypectomy  03/20/2007  . Dilation and curettage of uterus  2005  . Dilation and curettage of uterus  06/22/2011    Procedure: DILATATION AND CURETTAGE;  Surgeon: Alvino Chapel, MD;  Location: [facility];  Service: Gynecology;  Laterality: N/A;  OK PER KEELA FOR 7:15 START  . Breast lumpectomy with needle localization and axillary sentinel lymph node bx Left 09/03/2012    Procedure: BREAST LUMPECTOMY WITH NEEDLE LOCALIZATION AND AXILLARY SENTINEL LYMPH NODE BX;  Surgeon: Edward Jolly, MD;  Location: [facility];  Service: General;  Laterality: Left;  . Back surgery  1997    lumbar  . Dorsal compartment release Right 05/19/2013    Procedure: RELEASE 1ST  DORSAL COMPARTMENT RIGHT (DEQUERVAIN);  Surgeon: Cammie Sickle., MD;  Location[facility];  Service: Orthopedics;  Laterality: Right;   Family History  Problem Relation Age of Onset  . Hypertension Other   . Vision loss Mother   . Hypertension Mother   . Anesthesia problems Mother     post-op N/V  . Heart disease Mother   . Osteoporosis Mother   . Depression Father   . Heart disease Sister   . Hypertension Sister   . Hyperlipidemia Sister   .  Diabetes Brother   . Hypertension Brother   . Cancer Brother     skin  . Hyperlipidemia Brother   . COPD Daughter   . Cancer Cousin   . Kidney failure Maternal Grandmother   . Heart disease Maternal Grandfather   . Colon cancer Neg Hx    History  Substance Use Topics  . Smoking status: Never Smoker   . Smokeless tobacco: Never Used  . Alcohol Use: No   OB History    No data available     Review of Systems  Musculoskeletal: Positive for myalgias and arthralgias.  All other systems reviewed and are negative.   Allergies  Latex and Adhesive  Home Medications   Prior to Admission medications   Medication Sig Start Date End Date Taking? Authorizing Provider  aspirin 81 MG tablet Take 81 mg by  mouth daily.    Historical Provider, MD  blood glucose meter kit and supplies KIT Test blood sugar once daily. Dx code: E11.9 06/23/14   [name], MD  Cholecalciferol (VITAMIN D-3 PO) Take 400 mg by mouth daily.    Historical Provider, MD  Cyanocobalamin (VITAMIN B-12 CR PO) Take 500 mcg by mouth daily.     Historical Provider, MD  fluticasone (FLONASE) 50 MCG/ACT nasal spray Place 2 sprays into both nostrils daily. As needed 03/23/13   [name], MD  Glucose Blood (BLOOD GLUCOSE TEST STRIPS) STRP Test blood sugar once daily. Dx code: E11.9 06/23/14   [name], MD  Lancets MISC Check sugar once daily. Dx code: E11.9 06/23/14   [name], MD  letrozole [facility]) 2.5 MG tablet Take 1 tablet (2.5 mg total) by mouth daily. 02/10/14   Nicholas Lose, MD  lisinopril-hydrochlorothiazide (PRINZIDE,ZESTORETIC) 10-12.5 MG per tablet TAKE ONE TABLET BY MOUTH ONCE DAILY. 06/22/14   [name], MD  Loratadine-Pseudoephedrine [facility] ALLERGY RELIEF D, LORATID, PO) Take by mouth as needed.    Historical Provider, MD  lovastatin (MEVACOR) 20 MG tablet Take 1 tablet (20 mg total) by mouth at bedtime. 03/23/13   [name], MD  methocarbamol (ROBAXIN) 500 MG tablet Take 1 tablet (500 mg total) by mouth 4 (four) times daily. 06/21/14   [name], MD  Omega-3 Fatty Acids (FISH OIL) 1000 MG CAPS Take 1 capsule by mouth daily.    Historical Provider, MD  OVER THE COUNTER MEDICATION daily.    Historical Provider, MD  Turmeric 500 MG CAPS Take 500 mg by mouth daily.    Historical Provider, MD  vitamin C (ASCORBIC ACID) 500 MG tablet Take 1,000 mg by mouth daily.     Historical Provider, MD  Zinc 50 MG TABS Take 1 tablet by mouth daily.    Historical Provider, MD   BP 144/72 mmHg  Pulse 87  Temp(Src) 97.5 F (36.4 C) (Oral)  Resp 17  SpO2 99% Physical Exam  Constitutional: She is oriented to person, place, and time. She appears well-developed and well-nourished. No distress.  HENT:  Head:  Normocephalic and atraumatic.  Mouth/Throat: Oropharynx is clear and moist.  Eyes: Conjunctivae and EOM are normal. Pupils are equal, round, and reactive to light.  Neck: Normal range of motion. Neck supple. No tracheal deviation present.  Cardiovascular: Normal rate.   Pulmonary/Chest: Breath sounds normal. No respiratory distress.  Abdominal: Soft.  Musculoskeletal: Normal range of motion.       Cervical back: Normal.       Thoracic back: Normal.  Lumbar back: Normal.  Tender on the lateral right shoulder. No gross deformity. Pulses intact unable to raise the right arm  Neurological: She is alert and oriented to person, place, and time.  Skin: Skin is warm and dry.  Psychiatric: She has a normal mood and affect. Her behavior is normal.  Nursing note and vitals reviewed.   ED Course  Procedures  DIAGNOSTIC STUDIES: Oxygen Saturation is 99% on RA, normal by my interpretation.    COORDINATION OF CARE: 2:59 PM - Discussed plans to order an XR . Pt advised of plan for treatment and pt agrees.  Labs Review Labs Reviewed - No data to display  Imaging Review Dg Shoulder Right  10/04/2014   CLINICAL DATA:  The patient tripped over a stool today. Right shoulder pain. Initial encounter.  EXAM: RIGHT SHOULDER - 2+ VIEW  COMPARISON:  None.  FINDINGS: There is a mildly impacted surgical neck fracture of the surgical neck of the right humerus. The fracture extends involves the greater tuberosity. The humerus is located. The acromioclavicular joint is intact with degenerative change noted. Imaged right lung and ribs are unremarkable.  IMPRESSION: Mildly impacted surgical neck fracture right humerus involves the greater tuberosity.   Electronically Signed   By: Inge Rise M.D.   On: 10/04/2014 15:50   Dg Knee Complete 4 Views Right  10/04/2014   CLINICAL DATA:  Pt reports that she tripped over a stool and the next thing she remembers she was on the ground. She states that the pain is  currently mostly in the right shoulder and radiating towards the back, pain also in right knee with lateral bruising.  EXAM: RIGHT KNEE - COMPLETE 4+ VIEW  COMPARISON:  None.  FINDINGS: No fracture. Knee joint is normally aligned. There is medial joint space compartment narrowing and mild patellofemoral joint space compartment narrowing. There are small marginal osteophytes from all 3 compartments, most evident medially. Chondrocalcinosis is noted along the menisci.  No bone lesion.  No joint effusion.  Soft tissues are unremarkable.  IMPRESSION: 1. No fracture or acute finding. 2. Degenerative changes as described.   Electronically Signed   By: Lajean Manes M.D.   On: 10/04/2014 15:49     EKG Interpretation None      MDM   Final diagnoses:  Humeral surgical neck fracture, right, closed, initial encounter  Right knee pain  Fall, initial encounter    Pt placed in a sling. Given hydrocodone for pain. Neurovascularly intact. Pt has an appt with orthopedist in 2 days  I personally performed the services described in this documentation, which was scribed in my presence. The recorded information has been reviewed and is accurate.   Glendell Docker, NP 10/04/14 1601  Leonard Schwartz, MD 10/09/14 2225

## 2014-10-04 NOTE — ED Notes (Signed)
Declined W/C at D/C and was escorted to lobby by RN. 

## 2014-10-05 ENCOUNTER — Telehealth: Payer: Self-pay | Admitting: *Deleted

## 2014-10-05 ENCOUNTER — Telehealth: Payer: Self-pay

## 2014-10-05 NOTE — Telephone Encounter (Signed)
  Oncology Nurse Navigator Documentation    Navigator Encounter Type: Telephone (10/05/14 1600) - Patient left a voice mail about whether to restart her calcium following a recent bone density scan showing bone loss and a fractured humerus after a fall yesterday.  Patient also called Dr. Geralyn Flash nurse who has reviewed with Dr. Lindi Adie and called patient with instructions to resume taking her calcium supplements.  Patient denied any other questions or concerns at this time.  She is scheduled to see her orthopedist tomorrow.  I encouraged her to call me for any needs.    Barriers/Navigation Needs: No barriers at this time (10/05/14 1600)

## 2014-10-05 NOTE — Telephone Encounter (Signed)
Bone density reviewed by Dr. Lindi Adie. Patient advised to take Calcium 500mg  with Vitamin D Bid. Patient verbalized understanding.

## 2014-10-05 NOTE — Telephone Encounter (Signed)
Pt called stating she called last week and was told Dr Lindi Adie was out of the office and would call her on Monday. She had a bone density test that was bad. Dr Carren Rang wants her to take calcium. Dr Lindi Adie had told her not to take calcium. "I am between a rock and a hard place. I could have used this information sooner. It is already too late because I have fallen and broken my arm"

## 2014-10-06 DIAGNOSIS — M25511 Pain in right shoulder: Secondary | ICD-10-CM | POA: Diagnosis not present

## 2014-10-06 DIAGNOSIS — M79644 Pain in right finger(s): Secondary | ICD-10-CM | POA: Diagnosis not present

## 2014-10-11 ENCOUNTER — Telehealth: Payer: Self-pay

## 2014-10-11 NOTE — Telephone Encounter (Signed)
Pt states her is to get a physical and since she's to have blood work done, she broke her arm and do not know how she is to give blood on the arm she broke. Please call [phone] or [phone]

## 2014-10-11 NOTE — Telephone Encounter (Signed)
I called pt, advised her to still come in. We can try to get blood from her hand. Pt agreed.

## 2014-10-18 ENCOUNTER — Encounter: Payer: Self-pay | Admitting: Family Medicine

## 2014-10-18 ENCOUNTER — Ambulatory Visit (INDEPENDENT_AMBULATORY_CARE_PROVIDER_SITE_OTHER): Payer: Medicare Other | Admitting: Family Medicine

## 2014-10-18 VITALS — BP 147/95 | HR 88 | Temp 97.1°F | Resp 16 | Ht 61.0 in | Wt 214.0 lb

## 2014-10-18 DIAGNOSIS — I10 Essential (primary) hypertension: Secondary | ICD-10-CM

## 2014-10-18 DIAGNOSIS — E669 Obesity, unspecified: Secondary | ICD-10-CM

## 2014-10-18 DIAGNOSIS — E119 Type 2 diabetes mellitus without complications: Secondary | ICD-10-CM | POA: Diagnosis not present

## 2014-10-18 DIAGNOSIS — E785 Hyperlipidemia, unspecified: Secondary | ICD-10-CM

## 2014-10-18 DIAGNOSIS — M8580 Other specified disorders of bone density and structure, unspecified site: Secondary | ICD-10-CM | POA: Diagnosis not present

## 2014-10-18 LAB — POCT GLYCOSYLATED HEMOGLOBIN (HGB A1C): Hemoglobin A1C: 6.7

## 2014-10-18 LAB — GLUCOSE, POCT (MANUAL RESULT ENTRY): POC Glucose: 162 mg/dl — AB (ref 70–99)

## 2014-10-18 MED ORDER — METFORMIN HCL 500 MG PO TABS: 500.0000 mg | ORAL_TABLET | Freq: Two times a day (BID) | ORAL | Status: DC

## 2014-10-18 NOTE — Patient Instructions (Signed)
Diabetes and Exercise Exercising regularly is important. It is not just about losing weight. It has many health benefits, such as:  Improving your overall fitness, flexibility, and endurance.  Increasing your bone density.  Helping with weight control.  Decreasing your body fat.  Increasing your muscle strength.  Reducing stress and tension.  Improving your overall health. People with diabetes who exercise gain additional benefits because exercise:  Reduces appetite.  Improves the body's use of blood sugar (glucose).  Helps lower or control blood glucose.  Decreases blood pressure.  Helps control blood lipids (such as cholesterol and triglycerides).  Improves the body's use of the hormone insulin by:  Increasing the body's insulin sensitivity.  Reducing the body's insulin needs.  Decreases the risk for heart disease because exercising:  Lowers cholesterol and triglycerides levels.  Increases the levels of good cholesterol (such as high-density lipoproteins [HDL]) in the body.  Lowers blood glucose levels. YOUR ACTIVITY PLAN  Choose an activity that you enjoy and set realistic goals. Your health care provider or diabetes educator can help you make an activity plan that works for you. Exercise regularly as directed by your health care provider. This includes:  Performing resistance training twice a week such as push-ups, sit-ups, lifting weights, or using resistance bands.  Performing 150 minutes of cardio exercises each week such as walking, running, or playing sports.  Staying active and spending no more than 90 minutes at one time being inactive. Even short bursts of exercise are good for you. Three 10-minute sessions spread throughout the day are just as beneficial as a single 30-minute session. Some exercise ideas include:  Taking the dog for a walk.  Taking the stairs instead of the elevator.  Dancing to your favorite song.  Doing an exercise  video.  Doing your favorite exercise with a friend. RECOMMENDATIONS FOR EXERCISING WITH TYPE 1 OR TYPE 2 DIABETES   Check your blood glucose before exercising. If blood glucose levels are greater than 240 mg/dL, check for urine ketones. Do not exercise if ketones are present.  Avoid injecting insulin into areas of the body that are going to be exercised. For example, avoid injecting insulin into:  The arms when playing tennis.  The legs when jogging.  Keep a record of:  Food intake before and after you exercise.  Expected peak times of insulin action.  Blood glucose levels before and after you exercise.  The type and amount of exercise you have done.  Review your records with your health care provider. Your health care provider will help you to develop guidelines for adjusting food intake and insulin amounts before and after exercising.  If you take insulin or oral hypoglycemic agents, watch for signs and symptoms of hypoglycemia. They include:  Dizziness.  Shaking.  Sweating.  Chills.  Confusion.  Drink plenty of water while you exercise to prevent dehydration or heat stroke. Body water is lost during exercise and must be replaced.  Talk to your health care provider before starting an exercise program to make sure it is safe for you. Remember, almost any type of activity is better than none. Document Released: 05/26/2003 Document Revised: 07/20/2013 Document Reviewed: 08/12/2012 ExitCare Patient Information 2015 [employer]. This information is not intended to replace advice given to you by your health care provider. Make sure you discuss any questions you have with your health care provider.  

## 2014-10-18 NOTE — Progress Notes (Signed)
Subjective:    Patient ID: [name], female    DOB: [birth year], 69 y.o.   MRN: [id number]  10/18/2014  Diabetes; Hypertension; and hypercholesterolimia   HPI This 69 y.o. female presents for three month follow-up:  1.   HTN:  Increased to one whole tablet daily at last visit.  Home BP readings running 110/59-128/68.  Did increase medication to one daily.  2.  Hypercholesterolemia: recommended switching statin to morning.  Did not switch statin to morning.  Still taking statin every evening; misses sometimes.    3.  DMII: HgbA1c of 6.4 at last visit.  Checking sugars 140s fasting.   PP 172.  No exercise; no weight loss.  4. Lower back pain with radiculopathy: referred to ortho; home exercises; rx for Robaxin.  [facility].  Has not really addressed back due to humeral fracture. Not taking pain medication.    5.  R humeral fracture:  Two weeks ago; followed by Lynann Bologna.  Wearing sling.    6.  Osteopenia: s/p DEXA by [facility]; +osteopenia.  Recommended Calcium plus D.  T score -1.4.  History of elevated calcium level; PTH level normal at last visit.     Review of Systems  Constitutional: Negative for fever, chills, diaphoresis and fatigue.  Eyes: Negative for visual disturbance.  Respiratory: Negative for cough and shortness of breath.   Cardiovascular: Negative for chest pain, palpitations and leg swelling.  Gastrointestinal: Negative for nausea, vomiting, abdominal pain, diarrhea and constipation.  Endocrine: Negative for cold intolerance, heat intolerance, polydipsia, polyphagia and polyuria.  Musculoskeletal: Positive for myalgias, back pain and arthralgias.  Skin: Negative for color change, pallor, rash and wound.  Neurological: Negative for dizziness, tremors, seizures, syncope, facial asymmetry, speech difficulty, weakness, light-headedness, numbness and headaches.    Past Medical History  Diagnosis Date  . Postmenopausal bleeding   . Fibroids   . Headache(784.0)    tension  . Seasonal allergies   . Anemia     due to vaginal bleeding  . Urinary urgency   . Hypertension     under control with med., has been on med. x 3 yr.  . White coat hypertension   . History of endometriosis   . Breast cancer 08/2012    left  . Sleep apnea     uses CPAP nightly  . Arthritis     hands  . Dental crowns present   . Hyperlipidemia   . OSA (obstructive sleep apnea)   . S/P radiation therapy 10/20/2012-12/05/2012    left breast 50.4 gray, lumpectomy cavity boosted to 62.4 gray  . Allergy    Past Surgical History  Procedure Laterality Date  . Hysteroscopy w/d&c  05/06/2009  . Exploratory laparotomy  06-30-2007    ATTEMPTED HYSTERECTOMY ABORTED DUE TO EXTENSIVE ENDOMETRIOSIS W/ DENSE PELVIC ADHESIONS INVOLVING UTERUS AND LOWER RECTOSIGMOID  . Cholecystectomy  1995  . Colonoscopy w/ polypectomy  03/20/2007  . Dilation and curettage of uterus  2005  . Dilation and curettage of uterus  06/22/2011    Procedure: DILATATION AND CURETTAGE;  Surgeon: Alvino Chapel, MD;  Location: [facility];  Service: Gynecology;  Laterality: N/A;  OK PER KEELA FOR 7:15 START  . Breast lumpectomy with needle localization and axillary sentinel lymph node bx Left 09/03/2012    Procedure: BREAST LUMPECTOMY WITH NEEDLE LOCALIZATION AND AXILLARY SENTINEL LYMPH NODE BX;  Surgeon: Edward Jolly, MD;  Location: [facility];  Service: General;  Laterality: Left;  . Back surgery  1997    lumbar  . Dorsal compartment release Right 05/19/2013    Procedure: RELEASE 1ST  DORSAL COMPARTMENT RIGHT (DEQUERVAIN);  Surgeon: Cammie Sickle., MD;  Location: [facility];  Service: Orthopedics;  Laterality: Right;   Allergies  Allergen Reactions  . Latex Itching and Other (See Comments)    Causes blisters  . Adhesive [Tape] Rash    History   Social History  . Marital Status: Married    Spouse Name: [name]  . Number of Children: 4  . Years of  Education: College   Occupational History  . retired    Social History Main Topics  . Smoking status: Never Smoker   . Smokeless tobacco: Never Used  . Alcohol Use: No  . Drug Use: No  . Sexual Activity: Yes     Comment: menarche 43, P4, Premarin for short while, Provera x 3 years   Other Topics Concern  . Not on file   Social History Narrative   Marital status: married x 47 years; happily married; no abuse.      Children: 4 children; six grandchildren; no gg.      Lives:  With husband.      Employed: retired in 2011; [employer].      Tobacco: none       Alcohol:  None      Drugs: none      Exercise:  Daily. Walking x 1 hour three days per week.      Seatbelt:  100%      Sunscreen:  Face SPF 15.      Guns:  Loaded mostly secured.      ADLs:  Drives; no assistant devices; independent with all ADLs.       Her nocturia is improved under CPAP use at 10 cm water- AHI is now 0.8 and from 30 at baseline. CMS compliance  6 hours 40 minutes - sleeps on the side, 08-10-11 .FFM - likes it.    . Flonase used prn.     Caffeine Use: 1 cup daily       Objective:    BP 147/95 mmHg  Pulse 88  Temp(Src) 97.1 F (36.2 C) (Oral)  Resp 16  Ht 5\' 1"  (1.549 m)  Wt 214 lb (97.07 kg)  BMI 40.46 kg/m2  SpO2 97% Physical Exam  Constitutional: She is oriented to person, place, and time. She appears well-developed and well-nourished. No distress.  HENT:  Head: Normocephalic and atraumatic.  Right Ear: External ear normal.  Left Ear: External ear normal.  Nose: Nose normal.  Mouth/Throat: Oropharynx is clear and moist.  Eyes: Conjunctivae and EOM are normal. Pupils are equal, round, and reactive to light.  Neck: Normal range of motion. Neck supple. Carotid bruit is not present. No thyromegaly present.  Cardiovascular: Normal rate, regular rhythm, normal heart sounds and intact distal pulses.  Exam reveals no gallop and no friction rub.   No murmur  heard. Pulmonary/Chest: Effort normal and breath sounds normal. She has no wheezes. She has no rales.  Abdominal: Soft. Bowel sounds are normal. She exhibits no distension and no mass. There is no tenderness. There is no rebound and no guarding.  Musculoskeletal:  RUE in sling.  Lymphadenopathy:    She has no cervical adenopathy.  Neurological: She is alert and oriented to person, place, and time. No cranial nerve deficit.  Skin: Skin is warm and dry. No rash noted. She is not diaphoretic. No erythema. No  pallor.  Psychiatric: She has a normal mood and affect. Her behavior is normal.   Results for orders placed or performed in visit on 10/18/14  POCT glycosylated hemoglobin (Hb A1C)  Result Value Ref Range   Hemoglobin A1C 6.7   POCT glucose (manual entry)  Result Value Ref Range   POC Glucose 162 (A) 70 - 99 mg/dl       Assessment & Plan:   1. Type 2 diabetes mellitus without complication   2. Obesity (BMI 30-39.9)   3. Hyperlipidemia   4. Essential hypertension   5. Osteopenia    1. DMII: worsening; start Metformin 500mg  bid.  Obtain recent eye exam from Dr. Cena Benton. 2.  Obesity: recommend weight loss, exercise, low-caloric food choices. 3.  Hyperlipidemia: uncontrolled; improved compliance with statin yet did not switch to a.m. Dosing. 4.  HTN: persistently elevated yet normal home readings; no change to medication at this time; obtain labs.  5.  R humerus fracture: New.  Management by [facility]. 6. Lower back pain: persistent; undergoing evaluation by [facility]. 7. Osteopenia:  New.  Tolerating Caltrate-D.     Meds ordered this encounter  Medications  . calcium citrate (CALCITRATE - DOSED IN MG ELEMENTAL CALCIUM) 950 MG tablet    Sig: Take 200 mg of elemental calcium by mouth daily.  . metFORMIN (GLUCOPHAGE) 500 MG tablet    Sig: Take 1 tablet (500 mg total) by mouth 2 (two) times daily with a meal.    Dispense:  180 tablet    Refill:  3    Return in about 3 months (around  01/18/2015) for complete physical examiniation.    Trammell Bowden Elayne Guerin, M.D. [facility] [address] [place],   [address] [phone] phone [phone] fax

## 2014-10-20 DIAGNOSIS — M25511 Pain in right shoulder: Secondary | ICD-10-CM | POA: Diagnosis not present

## 2014-10-26 DIAGNOSIS — M25611 Stiffness of right shoulder, not elsewhere classified: Secondary | ICD-10-CM | POA: Diagnosis not present

## 2014-10-26 DIAGNOSIS — M25511 Pain in right shoulder: Secondary | ICD-10-CM | POA: Diagnosis not present

## 2014-10-28 ENCOUNTER — Ambulatory Visit: Payer: Medicare Other | Admitting: Adult Health

## 2014-10-29 DIAGNOSIS — M25511 Pain in right shoulder: Secondary | ICD-10-CM | POA: Diagnosis not present

## 2014-10-29 DIAGNOSIS — M25611 Stiffness of right shoulder, not elsewhere classified: Secondary | ICD-10-CM | POA: Diagnosis not present

## 2014-11-01 ENCOUNTER — Encounter: Payer: Self-pay | Admitting: Adult Health

## 2014-11-01 ENCOUNTER — Ambulatory Visit (INDEPENDENT_AMBULATORY_CARE_PROVIDER_SITE_OTHER): Payer: Medicare Other | Admitting: Adult Health

## 2014-11-01 VITALS — BP 136/79 | HR 90 | Ht 61.0 in | Wt 216.0 lb

## 2014-11-01 DIAGNOSIS — G4733 Obstructive sleep apnea (adult) (pediatric): Secondary | ICD-10-CM | POA: Diagnosis not present

## 2014-11-01 DIAGNOSIS — Z9989 Dependence on other enabling machines and devices: Principal | ICD-10-CM

## 2014-11-01 NOTE — Patient Instructions (Signed)
Continue using CPAP nightly Will send order to DME for new machine If your symptoms worsen or you develop new symptoms please let us know.

## 2014-11-01 NOTE — Progress Notes (Signed)
PATIENT: [name] DOB: 1946-01-23  REASON FOR VISIT: follow up -obstructive sleep apnea on CPAP  HISTORY FROM: patient  HISTORY OF PRESENT ILLNESS:  [name] is a 69 year old female with a history of obstructive sleep apnea on CPAP. She returns today for a 90 day compliance download. Her download today indicates that she uses her machine  for 87 out of 90 days for compliance of 97%. She uses her machine greater than 4 hours 86/90 days for compliance of 96%. On average she uses her machine 7 hours and 16 minutes. Her AHI is 1.1 with a minimum pressure of  6 cm of water and maximum pressure of 12 cm of water. The patient did break her right arm since the last visit. She states that this is causing some issue with her sleeping. She states that she normally goes to bed between 11:30 and 12 PM and arises at 7 AM. Her Epworth sleepiness score is 4 and fatigue severity score is 20. Overall she feels that she is doing well. She states that she had a letter from her DME company stating that she was due for a new machine. She would like a order today for that. She returns today for an evaluation.  HISTORY 10/27/13:   [name] is 69 year old female with history of obstructive sleep Apnea on CPAP. She returns today for a 90 day compliance download. She brought her machine with her today and her reports shows an AHI of 2.2 on auto-titration at 6-12 cm of water. She uses her machine for an average of 6 hours and 22 minutes a night, with 97%compliance. His Epworth score is 4 points was previously 3 . His fatigue severity score is 8 was previously 16. Patient reports that she gets about 6.5 hours of sleep a night. She goes to bed around 11:30 pm and arises between 6:30-7:30am. She denies having trouble falling asleep or staying a sleep. States that she normally does not have to get up at night to urinate. Overall patient feels that CPAP has improved his sleepiness and fatigue. Since the last visit the  Patient was diagnosed with breast cancer in June 2014. She is on an estrogen blocker now which has caused her to gain weight. She states she is now cancer free.   REVIEW OF SYSTEMS: Out of a complete 14 system review of symptoms, the patient complains only of the following symptoms, and all other reviewed systems are negative.   back pain, muscle cramps, neck pain, restless leg, apnea, leg swelling  ALLERGIES: Allergies  Allergen Reactions  . Latex Itching and Other (See Comments)    Causes blisters  . Adhesive [Tape] Rash    HOME MEDICATIONS: Outpatient Prescriptions Prior to Visit  Medication Sig Dispense Refill  . aspirin 81 MG tablet Take 81 mg by mouth daily.    . blood glucose meter kit and supplies KIT Test blood sugar once daily. Dx code: E11.9 1 each 0  . calcium citrate (CALCITRATE - DOSED IN MG ELEMENTAL CALCIUM) 950 MG tablet Take 200 mg of elemental calcium by mouth daily.    . Cholecalciferol (VITAMIN D-3 PO) Take 400 mg by mouth daily.    . Cyanocobalamin (VITAMIN B-12 CR PO) Take 500 mcg by mouth daily.     . fluticasone (FLONASE) 50 MCG/ACT nasal spray Place 2 sprays into both nostrils daily. As needed 16 g 11  . Glucose Blood (BLOOD GLUCOSE TEST STRIPS) STRP Test blood sugar once daily. Dx  code: E11.9 100 each 3  . HYDROcodone-acetaminophen (NORCO/VICODIN) 5-325 MG per tablet Take 1-2 tablets by mouth every 6 (six) hours as needed. 15 tablet 0  . Lancets MISC Check sugar once daily. Dx code: E11.9 100 each 3  . letrozole (FEMARA) 2.5 MG tablet Take 1 tablet (2.5 mg total) by mouth daily. 90 tablet 3  . lisinopril-hydrochlorothiazide (PRINZIDE,ZESTORETIC) 10-12.5 MG per tablet TAKE ONE TABLET BY MOUTH ONCE DAILY. 90 tablet 1  . Loratadine-Pseudoephedrine (PX ALLERGY RELIEF D, LORATID, PO) Take by mouth as needed.    . lovastatin (MEVACOR) 20 MG tablet Take 1 tablet (20 mg total) by mouth at bedtime. 90 tablet 3  . metFORMIN (GLUCOPHAGE) 500 MG tablet Take 1 tablet (500  mg total) by mouth 2 (two) times daily with a meal. 180 tablet 3  . methocarbamol (ROBAXIN) 500 MG tablet Take 1 tablet (500 mg total) by mouth 4 (four) times daily. 60 tablet 0  . Omega-3 Fatty Acids (FISH OIL) 1000 MG CAPS Take 1 capsule by mouth daily.    Marland Kitchen OVER THE COUNTER MEDICATION daily.    . Turmeric 500 MG CAPS Take 500 mg by mouth daily.    . vitamin C (ASCORBIC ACID) 500 MG tablet Take 1,000 mg by mouth daily.     . Zinc 50 MG TABS Take 1 tablet by mouth daily.     No facility-administered medications prior to visit.    PAST MEDICAL HISTORY: Past Medical History  Diagnosis Date  . Postmenopausal bleeding   . Fibroids   . Headache(784.0)     tension  . Seasonal allergies   . Anemia     due to vaginal bleeding  . Urinary urgency   . Hypertension     under control with med., has been on med. x 3 yr.  . White coat hypertension   . History of endometriosis   . Breast cancer 08/2012    left  . Sleep apnea     uses CPAP nightly  . Arthritis     hands  . Dental crowns present   . Hyperlipidemia   . OSA (obstructive sleep apnea)   . S/P radiation therapy 10/20/2012-12/05/2012    left breast 50.4 gray, lumpectomy cavity boosted to 62.4 gray  . Allergy     PAST SURGICAL HISTORY: Past Surgical History  Procedure Laterality Date  . Hysteroscopy w/d&c  05/06/2009  . Exploratory laparotomy  06-30-2007    ATTEMPTED HYSTERECTOMY ABORTED DUE TO EXTENSIVE ENDOMETRIOSIS W/ DENSE PELVIC ADHESIONS INVOLVING UTERUS AND LOWER RECTOSIGMOID  . Cholecystectomy  1995  . Colonoscopy w/ polypectomy  03/20/2007  . Dilation and curettage of uterus  2005  . Dilation and curettage of uterus  06/22/2011    Procedure: DILATATION AND CURETTAGE;  Surgeon: Alvino Chapel, MD;  Location: [facility];  Service: Gynecology;  Laterality: N/A;  OK PER KEELA FOR 7:15 START  . Breast lumpectomy with needle localization and axillary sentinel lymph node bx Left 09/03/2012    Procedure:  BREAST LUMPECTOMY WITH NEEDLE LOCALIZATION AND AXILLARY SENTINEL LYMPH NODE BX;  Surgeon: Edward Jolly, MD;  Location: [facility];  Service: General;  Laterality: Left;  . Back surgery  1997    lumbar  . Dorsal compartment release Right 05/19/2013    Procedure: RELEASE 1ST  DORSAL COMPARTMENT RIGHT (DEQUERVAIN);  Surgeon: Cammie Sickle., MD;  Location: [facility];  Service: Orthopedics;  Laterality: Right;    FAMILY HISTORY: Family History  Problem Relation Age of Onset  . Hypertension Other   . Vision loss Mother   . Hypertension Mother   . Anesthesia problems Mother     post-op N/V  . Heart disease Mother   . Osteoporosis Mother   . Depression Father   . Heart disease Sister   . Hypertension Sister   . Hyperlipidemia Sister   . Diabetes Brother   . Hypertension Brother   . Cancer Brother     skin  . Hyperlipidemia Brother   . COPD Daughter   . Cancer Cousin   . Kidney failure Maternal Grandmother   . Heart disease Maternal Grandfather   . Colon cancer Neg Hx     SOCIAL HISTORY: Social History   Social History  . Marital Status: Married    Spouse Name: [name]  . Number of Children: 4  . Years of Education: College   Occupational History  . retired    Social History Main Topics  . Smoking status: Never Smoker   . Smokeless tobacco: Never Used  . Alcohol Use: No  . Drug Use: No  . Sexual Activity: Yes     Comment: menarche 41, P4, Premarin for short while, Provera x 3 years   Other Topics Concern  . Not on file   Social History Narrative   Marital status: married x 101  years; happily married; no abuse.      Children: 4 children; six grandchildren; no gg.      Lives:  With husband.      Employed: retired in 2011; [employer].      Tobacco: none       Alcohol:  None      Drugs: none      Exercise:  Daily. Walking x 1 hour three days per week.      Seatbelt:  100%      Sunscreen:  Face SPF 15.       Guns:  Loaded mostly secured.      ADLs:  Drives; no assistant devices; independent with all ADLs.       Her nocturia is improved under CPAP use at 10 cm water- AHI is now 0.8 and from 30 at baseline. CMS compliance  6 hours 40 minutes - sleeps on the side, 08-10-11 .FFM - likes it.    . Flonase used prn.     Caffeine Use: 1 cup daily      PHYSICAL EXAM  Filed Vitals:   11/01/14 1538  BP: 136/79  Pulse: 90  Height: 5' 1"  (1.549 m)  Weight: 216 lb (97.977 kg)   Body mass index is 40.83 kg/(m^2).  Generalized: Well developed, in no acute distress  neck:  Circumference 16-1/2 inches, Mallampati 2+   Neurological examination  Mentation: Alert oriented to time, place, history taking. Follows all commands speech and language fluent Cranial nerve II-XII: Pupils were equal round reactive to light. Extraocular movements were full, visual field were full on confrontational test. Facial sensation and strength were normal. Uvula tongue midline. Head turning and shoulder shrug  were normal and symmetric. Motor: The motor testing reveals 5 over 5 strength  In the left upper extremity in lower extreme bilaterally. Limited mobility in the right upper extremity due to fracture. Good symmetric motor tone is noted throughout.  Sensory: Sensory testing is intact to soft touch on all 4 extremities. No evidence of extinction is noted.  Coordination: Cerebellar testing reveals good finger-nose-finger on the left unable to do the  right due to fracture and heel-to-shin bilaterally.  Gait and station: Gait is normal. Tandem gait is normal. Romberg is negative. No drift is seen.  Reflexes: Deep tendon reflexes are symmetric and normal bilaterally.   DIAGNOSTIC DATA (LABS, IMAGING, TESTING) - I reviewed patient records, labs, notes, testing and imaging myself where available.  Lab Results  Component Value Date   WBC 6.0 06/24/2014   HGB 13.4 06/24/2014   HCT 39.6 06/24/2014   MCV 86.3 06/24/2014    PLT 225 06/24/2014      Component Value Date/Time   NA 136 06/24/2014 0757   NA 137 06/21/2014 1147   K 4.3 06/24/2014 0757   K 4.6 06/21/2014 1147   CL 100 06/21/2014 1147   CL 104 08/27/2012 1219   CO2 25 06/24/2014 0757   CO2 28 06/21/2014 1147   GLUCOSE 164* 06/24/2014 0757   GLUCOSE 132* 06/21/2014 1147   GLUCOSE 142* 08/27/2012 1219   BUN 14.3 06/24/2014 0757   BUN 12 06/21/2014 1147   CREATININE 0.8 06/24/2014 0757   CREATININE 0.67 06/21/2014 1147   CREATININE 0.72 05/13/2013 1430   CALCIUM 9.8 06/24/2014 0757   CALCIUM 9.8 06/24/2014 0757   PROT 7.2 06/24/2014 0757   PROT 7.3 06/21/2014 1147   ALBUMIN 3.8 06/24/2014 0757   ALBUMIN 4.2 06/21/2014 1147   AST 16 06/24/2014 0757   AST 18 06/21/2014 1147   ALT 19 06/24/2014 0757   ALT 19 06/21/2014 1147   ALKPHOS 157* 06/24/2014 0757   ALKPHOS 136* 06/21/2014 1147   BILITOT 0.45 06/24/2014 0757   BILITOT 0.6 06/21/2014 1147   GFRNONAA >89 11/16/2013 1352   GFRNONAA 86* 05/13/2013 1430   GFRAA >89 11/16/2013 1352   GFRAA >90 05/13/2013 1430   Lab Results  Component Value Date   CHOL 248* 06/21/2014   HDL 44* 06/21/2014   LDLCALC 188* 06/21/2014   TRIG 81 06/21/2014   CHOLHDL 5.6 06/21/2014   Lab Results  Component Value Date   HGBA1C 6.7 10/18/2014   No results found for: [id number] Lab Results  Component Value Date   TSH 1.283 09/15/2012      ASSESSMENT AND PLAN 69 y.o. year old female  has a past medical history of Postmenopausal bleeding; Fibroids; Headache(784.0); Seasonal allergies; Anemia; Urinary urgency; Hypertension; White coat hypertension; History of endometriosis; Breast cancer (08/2012); Sleep apnea; Arthritis; Dental crowns present; Hyperlipidemia; OSA (obstructive sleep apnea); S/P radiation therapy (10/20/2012-12/05/2012); and Allergy. here with:   1. Obstructive sleep apnea on CPAP   Overall the patient is doing well. She will continue on CPAP. I have provided the patient with a  prescription for a new machine with her current settings. We will fax this to her DME company. Patient advised that if her symptoms worsen or she develops new symptoms she should let us know. She will follow-up in one year with Dr.Dohmeier     Ward Givens, MSN, NP-C 11/01/2014, 4:07 PM [facility] [address] [place], [place] [address] [phone]

## 2014-11-01 NOTE — Progress Notes (Signed)
I agree with the assessment and plan as directed by NP .The patient is known to me .   Bexlee Bergdoll, MD  

## 2014-11-02 DIAGNOSIS — M25511 Pain in right shoulder: Secondary | ICD-10-CM | POA: Diagnosis not present

## 2014-11-02 DIAGNOSIS — M25611 Stiffness of right shoulder, not elsewhere classified: Secondary | ICD-10-CM | POA: Diagnosis not present

## 2014-11-03 DIAGNOSIS — S42294A Other nondisplaced fracture of upper end of right humerus, initial encounter for closed fracture: Secondary | ICD-10-CM | POA: Diagnosis not present

## 2014-11-04 DIAGNOSIS — M25611 Stiffness of right shoulder, not elsewhere classified: Secondary | ICD-10-CM | POA: Diagnosis not present

## 2014-11-04 DIAGNOSIS — M25511 Pain in right shoulder: Secondary | ICD-10-CM | POA: Diagnosis not present

## 2014-11-09 ENCOUNTER — Telehealth: Payer: Self-pay | Admitting: Adult Health

## 2014-11-09 DIAGNOSIS — M25511 Pain in right shoulder: Secondary | ICD-10-CM | POA: Diagnosis not present

## 2014-11-09 DIAGNOSIS — M25611 Stiffness of right shoulder, not elsewhere classified: Secondary | ICD-10-CM | POA: Diagnosis not present

## 2014-11-09 NOTE — Telephone Encounter (Signed)
Pt called and states that [facility] has told her that they do not have enough information. She was told they faxed a form to [place] twice. I provided the pt with a fax number and she stated that she will call them and let them know.  Phone number for [facility]  [phone]

## 2014-11-10 NOTE — Telephone Encounter (Signed)
Faxed  Office notes [facility]  [phone] phone [phone].

## 2014-11-11 DIAGNOSIS — M25611 Stiffness of right shoulder, not elsewhere classified: Secondary | ICD-10-CM | POA: Diagnosis not present

## 2014-11-11 DIAGNOSIS — M25511 Pain in right shoulder: Secondary | ICD-10-CM | POA: Diagnosis not present

## 2014-11-16 DIAGNOSIS — M25611 Stiffness of right shoulder, not elsewhere classified: Secondary | ICD-10-CM | POA: Diagnosis not present

## 2014-11-16 DIAGNOSIS — M25511 Pain in right shoulder: Secondary | ICD-10-CM | POA: Diagnosis not present

## 2014-11-18 DIAGNOSIS — M25611 Stiffness of right shoulder, not elsewhere classified: Secondary | ICD-10-CM | POA: Diagnosis not present

## 2014-11-18 DIAGNOSIS — M25511 Pain in right shoulder: Secondary | ICD-10-CM | POA: Diagnosis not present

## 2014-11-24 DIAGNOSIS — M25611 Stiffness of right shoulder, not elsewhere classified: Secondary | ICD-10-CM | POA: Diagnosis not present

## 2014-11-24 DIAGNOSIS — M25511 Pain in right shoulder: Secondary | ICD-10-CM | POA: Diagnosis not present

## 2014-11-24 DIAGNOSIS — M545 Low back pain: Secondary | ICD-10-CM | POA: Diagnosis not present

## 2014-11-24 DIAGNOSIS — M4806 Spinal stenosis, lumbar region: Secondary | ICD-10-CM | POA: Diagnosis not present

## 2014-11-25 DIAGNOSIS — M25611 Stiffness of right shoulder, not elsewhere classified: Secondary | ICD-10-CM | POA: Diagnosis not present

## 2014-11-25 DIAGNOSIS — M25511 Pain in right shoulder: Secondary | ICD-10-CM | POA: Diagnosis not present

## 2014-11-30 DIAGNOSIS — M25511 Pain in right shoulder: Secondary | ICD-10-CM | POA: Diagnosis not present

## 2014-11-30 DIAGNOSIS — M25611 Stiffness of right shoulder, not elsewhere classified: Secondary | ICD-10-CM | POA: Diagnosis not present

## 2014-12-02 DIAGNOSIS — M25511 Pain in right shoulder: Secondary | ICD-10-CM | POA: Diagnosis not present

## 2014-12-02 DIAGNOSIS — M25611 Stiffness of right shoulder, not elsewhere classified: Secondary | ICD-10-CM | POA: Diagnosis not present

## 2014-12-03 DIAGNOSIS — M545 Low back pain: Secondary | ICD-10-CM | POA: Diagnosis not present

## 2014-12-07 DIAGNOSIS — M25611 Stiffness of right shoulder, not elsewhere classified: Secondary | ICD-10-CM | POA: Diagnosis not present

## 2014-12-07 DIAGNOSIS — M25511 Pain in right shoulder: Secondary | ICD-10-CM | POA: Diagnosis not present

## 2014-12-09 DIAGNOSIS — M25511 Pain in right shoulder: Secondary | ICD-10-CM | POA: Diagnosis not present

## 2014-12-09 DIAGNOSIS — M25611 Stiffness of right shoulder, not elsewhere classified: Secondary | ICD-10-CM | POA: Diagnosis not present

## 2014-12-14 DIAGNOSIS — M25611 Stiffness of right shoulder, not elsewhere classified: Secondary | ICD-10-CM | POA: Diagnosis not present

## 2014-12-14 DIAGNOSIS — M25511 Pain in right shoulder: Secondary | ICD-10-CM | POA: Diagnosis not present

## 2014-12-17 DIAGNOSIS — M545 Low back pain: Secondary | ICD-10-CM | POA: Diagnosis not present

## 2014-12-17 DIAGNOSIS — M542 Cervicalgia: Secondary | ICD-10-CM | POA: Diagnosis not present

## 2014-12-17 DIAGNOSIS — M7552 Bursitis of left shoulder: Secondary | ICD-10-CM | POA: Diagnosis not present

## 2014-12-17 DIAGNOSIS — M4807 Spinal stenosis, lumbosacral region: Secondary | ICD-10-CM | POA: Diagnosis not present

## 2014-12-21 DIAGNOSIS — M4807 Spinal stenosis, lumbosacral region: Secondary | ICD-10-CM | POA: Diagnosis not present

## 2014-12-21 DIAGNOSIS — M545 Low back pain: Secondary | ICD-10-CM | POA: Diagnosis not present

## 2014-12-21 DIAGNOSIS — Z853 Personal history of malignant neoplasm of breast: Secondary | ICD-10-CM | POA: Diagnosis not present

## 2014-12-21 LAB — HM MAMMOGRAPHY

## 2014-12-24 DIAGNOSIS — M4807 Spinal stenosis, lumbosacral region: Secondary | ICD-10-CM | POA: Diagnosis not present

## 2014-12-24 DIAGNOSIS — M545 Low back pain: Secondary | ICD-10-CM | POA: Diagnosis not present

## 2014-12-27 LAB — HM DIABETES EYE EXAM

## 2014-12-28 ENCOUNTER — Telehealth: Payer: Self-pay

## 2014-12-28 DIAGNOSIS — M545 Low back pain: Secondary | ICD-10-CM | POA: Diagnosis not present

## 2014-12-28 DIAGNOSIS — M4807 Spinal stenosis, lumbosacral region: Secondary | ICD-10-CM | POA: Diagnosis not present

## 2014-12-28 NOTE — Telephone Encounter (Signed)
Pt's BP medicine that she has been taking was for only half a pill each time.  Her BP had went up and she was told to start taking a whole pill each time.  When she went to get the script filled it was only for a quantity of 45.  This will not be enough to get here through now that she takes a whole pill.  Please call her at [phone] (home) or [phone] (mobile)

## 2014-12-29 NOTE — Telephone Encounter (Signed)
Pt called back and she stated that she had already picked up her rx that was for 1/2 tablet daily of her BP meds.   She stated that the pharmacy will not take these back, so she will continue to take these for the next 3 months.  She stated that she has an appt in December with Dr. Tamala Julian and I advised her to keep a check on her BP and call for any concerns.  She stated that she broke her right  arm in July and had breast cancer in her left breast, so she did get out of the habit of checking her BP, but she will start back and call for any problems.  Will forward to Dr. Tamala Julian to make her aware. Nothing further is needed.

## 2014-12-29 NOTE — Assessment & Plan Note (Signed)
Left breast invasive lobular cancer ER/PR positive HER-2 negative (initial biopsy showed HER-2 positive but final pathology on the lumpectomy did not) status post left breast lumpectomy and radiation and currently on antiestrogen therapy with Arimidex started 12/01/2012.  Arimidex toxicities: Patient is tolerating Arimidex fairly well except for muscle aches and pains. She is taking meloxicam for pain. She reports that Claritin-D helped with the symptoms.  Breast cancer surveillance: 1. Breast exam 12/30/2014 is normal 2. Mammogram 09/29/2013 is normal  Hypercalcemia: She has had episodic hypercalcemia. PTH level is pending from today. I instructed her to follow with her primary care physician regarding this issue. Since we stopped calcium supplementation, her calcium levels have come down. Last week her calcium level was at 10.  Return to clinic in 1 year for followup.

## 2014-12-29 NOTE — Telephone Encounter (Signed)
Called and left message on machine for the pt to call back.

## 2014-12-30 ENCOUNTER — Encounter: Payer: Self-pay | Admitting: *Deleted

## 2014-12-30 ENCOUNTER — Encounter: Payer: Self-pay | Admitting: Hematology and Oncology

## 2014-12-30 ENCOUNTER — Ambulatory Visit (HOSPITAL_BASED_OUTPATIENT_CLINIC_OR_DEPARTMENT_OTHER): Payer: Medicare Other | Admitting: Hematology and Oncology

## 2014-12-30 ENCOUNTER — Telehealth: Payer: Self-pay | Admitting: Hematology and Oncology

## 2014-12-30 VITALS — BP 153/68 | HR 83 | Temp 97.8°F | Resp 18 | Ht 61.0 in | Wt 212.1 lb

## 2014-12-30 DIAGNOSIS — M545 Low back pain: Secondary | ICD-10-CM | POA: Diagnosis not present

## 2014-12-30 DIAGNOSIS — C50112 Malignant neoplasm of central portion of left female breast: Secondary | ICD-10-CM | POA: Diagnosis not present

## 2014-12-30 DIAGNOSIS — Z170 Estrogen receptor positive status [ER+]: Secondary | ICD-10-CM | POA: Diagnosis not present

## 2014-12-30 DIAGNOSIS — Z79811 Long term (current) use of aromatase inhibitors: Secondary | ICD-10-CM | POA: Diagnosis not present

## 2014-12-30 DIAGNOSIS — M4807 Spinal stenosis, lumbosacral region: Secondary | ICD-10-CM | POA: Diagnosis not present

## 2014-12-30 MED ORDER — LISINOPRIL-HYDROCHLOROTHIAZIDE 10-12.5 MG PO TABS: ORAL_TABLET | ORAL | Status: DC

## 2014-12-30 NOTE — Telephone Encounter (Signed)
Call -- I sent in an rx for 1 tablet daily  Lisinopril/HCTZ with #90 tablets and 1 refill on 06/23/14.  I'm not sure why pharmacy dispensed only #45 tablets.  I have sent in a renewed rx for one tablet daily.

## 2014-12-30 NOTE — Telephone Encounter (Signed)
Appointments made and avs pritned for pateint °

## 2014-12-30 NOTE — Progress Notes (Signed)
Patient Care Team: [name], MD as PCP - General (Family Medicine) [name], MD (Family Medicine)  DIAGNOSIS: Cancer of central portion of left female breast  [facility])   Staging form: Breast, AJCC 7th Edition     Clinical: Stage IA (T1c, N0, cM0) - Unsigned       Staging comments: Staged at breast conference 6.11.14      Pathologic: No stage assigned - Unsigned   SUMMARY OF ONCOLOGIC HISTORY:   Cancer of central portion of left female breast ([name])   08/13/2012 Mammogram Questionable mass deep to the nipple superior periareolar region with calcifications.U/S 5 x 8 mm mass at 12:00, ovoid cystic lesion at 11:00 1.5X 0.4 cm   08/25/2012 Initial Diagnosis Invasive mammary cancer with mammary cancer in situ grade 2 with lobular features ER 100%, PR 63%, Ki-67 58%, HER-2 positive ratio 2.48   08/26/2012 Breast MRI Dominant, biopsied mass with small immediately adjacent satellite mass consistent with biopsy results indicating malignancy, measuring 17 x 11 x 15m   09/03/2012 Surgery Left breast lumpectomy invasive lobular cancer with lymphovascular invasion and lobular carcinoma in situ positive margins one SLN negative, grade 2; 1.6 cm, ER 100%, PR 63%, HER-2 positive, Ki-67 58% T1 C. N0 M0 stage IA   10/21/2012 - 11/28/2012 Radiation Therapy Radiation therapy to the lumpectomy site   12/01/2012 -  Anti-estrogen oral therapy Arimidex 1 mg daily planned duration of treatment 5 years, switched to letrozole January 2016 for musculoskeletal aches and pains    CHIEF COMPLIANT: follow-up on letrozole  INTERVAL HISTORY: Diane VANDERGRIFFis a 69year old with above-mentioned history of left breast cancer currently on antiestrogen therapy with letrozole. She is tolerating letrozole much better. She does continue to have intermittent musculoskeletal aches and pains. She has remained on antiestrogen therapy for the past 2 years. She had a fracture of the right humerus. She is doing much better. She had low  back pain for which she had an MRI which showed spinal stenosis and is undergoing physical therapy. She has not seen any major improvement.occasionally she has stiffness in the hands.   REVIEW OF SYSTEMS:   Constitutional: Denies fevers, chills or abnormal weight loss Eyes: Denies blurriness of vision Ears, nose, mouth, throat, and face: Denies mucositis or sore throat Respiratory: Denies cough, dyspnea or wheezes Cardiovascular: Denies palpitation, chest discomfort or lower extremity swelling Gastrointestinal:  Denies nausea, heartburn or change in bowel habits Skin: Denies abnormal skin rashes Lymphatics: Denies new lymphadenopathy or easy bruising Neurological:Denies numbness, tingling or new weaknesses Behavioral/Psych: Mood is stable, no new changes  Breast:  denies any pain or lumps or nodules in either breasts All other systems were reviewed with the patient and are negative.  I have reviewed the past medical history, past surgical history, social history and family history with the patient and they are unchanged from previous note.  ALLERGIES:  is allergic to latex and adhesive.  MEDICATIONS:  Current Outpatient Prescriptions  Medication Sig Dispense Refill  . aspirin 81 MG tablet Take 81 mg by mouth daily.    . blood glucose meter kit and supplies KIT Test blood sugar once daily. Dx code: E11.9 1 each 0  . calcium citrate (CALCITRATE - DOSED IN MG ELEMENTAL CALCIUM) 950 MG tablet Take 200 mg of elemental calcium by mouth daily.    . Cholecalciferol (VITAMIN D-3 PO) Take 400 mg by mouth daily.    . Cyanocobalamin (VITAMIN B-12 CR PO) Take 500 mcg by mouth daily.     .Marland Kitchen  fluticasone (FLONASE) 50 MCG/ACT nasal spray Place 2 sprays into both nostrils daily. As needed 16 g 11  . Glucose Blood (BLOOD GLUCOSE TEST STRIPS) STRP Test blood sugar once daily. Dx code: E11.9 100 each 3  . Lancets MISC Check sugar once daily. Dx code: E11.9 100 each 3  . letrozole (FEMARA) 2.5 MG tablet  Take 1 tablet (2.5 mg total) by mouth daily. 90 tablet 3  . lisinopril-hydrochlorothiazide (PRINZIDE,ZESTORETIC) 10-12.5 MG per tablet TAKE ONE TABLET BY MOUTH ONCE DAILY. 90 tablet 1  . Loratadine-Pseudoephedrine (PX ALLERGY RELIEF D, LORATID, PO) Take by mouth as needed.    . lovastatin (MEVACOR) 20 MG tablet Take 1 tablet (20 mg total) by mouth at bedtime. 90 tablet 3  . metFORMIN (GLUCOPHAGE) 500 MG tablet Take 1 tablet (500 mg total) by mouth 2 (two) times daily with a meal. 180 tablet 3  . methocarbamol (ROBAXIN) 500 MG tablet Take 1 tablet (500 mg total) by mouth 4 (four) times daily. 60 tablet 0  . Omega-3 Fatty Acids (FISH OIL) 1000 MG CAPS Take 1 capsule by mouth daily.    Marland Kitchen OVER THE COUNTER MEDICATION daily.    . Turmeric 500 MG CAPS Take 500 mg by mouth daily.    . vitamin C (ASCORBIC ACID) 500 MG tablet Take 1,000 mg by mouth daily.     . Zinc 50 MG TABS Take 1 tablet by mouth daily.    . [DISCONTINUED] LISINOPRIL PO Take 1 tablet by mouth daily.     No current facility-administered medications for this visit.    PHYSICAL EXAMINATION: ECOG PERFORMANCE STATUS: 1 - Symptomatic but completely ambulatory  Filed Vitals:   12/30/14 0809  BP: 153/68  Pulse: 83  Temp: 97.8 F (36.6 C)  Resp: 18   Filed Weights   12/30/14 0809  Weight: 212 lb 1.6 oz (96.208 kg)    GENERAL:alert, no distress and comfortable SKIN: skin color, texture, turgor are normal, no rashes or significant lesions EYES: normal, Conjunctiva are pink and non-injected, sclera clear OROPHARYNX:no exudate, no erythema and lips, buccal mucosa, and tongue normal  NECK: supple, thyroid normal size, non-tender, without nodularity LYMPH:  no palpable lymphadenopathy in the cervical, axillary or inguinal LUNGS: clear to auscultation and percussion with normal breathing effort HEART: regular rate & rhythm and no murmurs and no lower extremity edema ABDOMEN:abdomen soft, non-tender and normal bowel  sounds Musculoskeletal:no cyanosis of digits and no clubbing  NEURO: alert & oriented x 3 with fluent speech, no focal motor/sensory deficits BREAST: No palpable masses or nodules in either right or left breasts. No palpable axillary supraclavicular or infraclavicular adenopathy no breast tenderness or nipple discharge. (exam performed in the presence of a chaperone)  LABORATORY DATA:  I have reviewed the data as listed   Chemistry      Component Value Date/Time   NA 136 06/24/2014 0757   NA 137 06/21/2014 1147   K 4.3 06/24/2014 0757   K 4.6 06/21/2014 1147   CL 100 06/21/2014 1147   CL 104 08/27/2012 1219   CO2 25 06/24/2014 0757   CO2 28 06/21/2014 1147   BUN 14.3 06/24/2014 0757   BUN 12 06/21/2014 1147   CREATININE 0.8 06/24/2014 0757   CREATININE 0.67 06/21/2014 1147   CREATININE 0.72 05/13/2013 1430      Component Value Date/Time   CALCIUM 9.8 06/24/2014 0757   CALCIUM 9.8 06/24/2014 0757   ALKPHOS 157* 06/24/2014 0757   ALKPHOS 136* 06/21/2014 1147   AST  16 06/24/2014 0757   AST 18 06/21/2014 1147   ALT 19 06/24/2014 0757   ALT 19 06/21/2014 1147   BILITOT 0.45 06/24/2014 0757   BILITOT 0.6 06/21/2014 1147       Lab Results  Component Value Date   WBC 6.0 06/24/2014   HGB 13.4 06/24/2014   HCT 39.6 06/24/2014   MCV 86.3 06/24/2014   PLT 225 06/24/2014   NEUTROABS 3.9 06/24/2014   ASSESSMENT & PLAN:  Cancer of central portion of left female breast Left breast invasive lobular cancer ER/PR positive HER-2 negative (initial biopsy showed HER-2 positive but final pathology on the lumpectomy did not) status post left breast lumpectomy and radiation and currently on antiestrogen therapy with Arimidex started 12/01/2012.  Arimidex toxicities: Patient is tolerating Arimidex fairly well except for muscle aches and pains. She is taking meloxicam for pain.   Right humerus fracture: after fall.  Breast cancer surveillance: 1. Breast exam 12/30/2014 is normal 2.  Mammogram 12/21/2014 is normal breast density category B 3. Bone density June 2016: T score -1.4  Hypercalcemia: She has had episodic hypercalcemia. PTH level was normal April 2016 (52). Since we stopped calcium supplementation, her calcium levels have come down. She was restarted on calcium supplementation because of osteopenia T score -1.4  Return to clinic in 6 months for followup and after that we will see her once a year.  No orders of the defined types were placed in this encounter.   The patient has a good understanding of the overall plan. she agrees with it. she will call with any problems that may develop before the next visit here.   Rulon Eisenmenger, MD 12/30/2014

## 2014-12-30 NOTE — Progress Notes (Signed)
Received mammo report from [facility], sent to scan. 

## 2014-12-31 DIAGNOSIS — M545 Low back pain: Secondary | ICD-10-CM | POA: Diagnosis not present

## 2014-12-31 DIAGNOSIS — M7552 Bursitis of left shoulder: Secondary | ICD-10-CM | POA: Diagnosis not present

## 2014-12-31 NOTE — Telephone Encounter (Signed)
Called pt and advised message from provider on their voicemail.  

## 2014-12-31 NOTE — Progress Notes (Signed)
  Oncology Nurse Navigator Documentation    Navigator Encounter Type: Other (Follow up appointment) (12/30/14 0930) Patient Visit Type: Medonc (12/30/14 0930)   Barriers/Navigation Needs: No barriers at this time (12/30/14 0930)   Interventions: None required (12/30/14 0930)  Patient at [facility] for follow up appointment.  Patient accompanied by her husband.  She reports that she is doing well and denies any questions or concerns at this time.  I encouraged her to call me for any needs.         Time Spent with Patient: 15 (12/30/14 0930)

## 2015-01-05 DIAGNOSIS — E119 Type 2 diabetes mellitus without complications: Secondary | ICD-10-CM | POA: Diagnosis not present

## 2015-01-05 DIAGNOSIS — H40013 Open angle with borderline findings, low risk, bilateral: Secondary | ICD-10-CM | POA: Diagnosis not present

## 2015-01-05 DIAGNOSIS — H2513 Age-related nuclear cataract, bilateral: Secondary | ICD-10-CM | POA: Diagnosis not present

## 2015-01-06 ENCOUNTER — Encounter: Payer: Self-pay | Admitting: Family Medicine

## 2015-01-07 DIAGNOSIS — M545 Low back pain: Secondary | ICD-10-CM | POA: Diagnosis not present

## 2015-01-07 DIAGNOSIS — M4807 Spinal stenosis, lumbosacral region: Secondary | ICD-10-CM | POA: Diagnosis not present

## 2015-01-12 ENCOUNTER — Encounter: Payer: Self-pay | Admitting: Family Medicine

## 2015-01-26 ENCOUNTER — Telehealth: Payer: Self-pay

## 2015-01-26 MED ORDER — LOVASTATIN 20 MG PO TABS: 20.0000 mg | ORAL_TABLET | Freq: Every day | ORAL | Status: DC

## 2015-01-26 NOTE — Telephone Encounter (Signed)
Rx sent 

## 2015-01-26 NOTE — Telephone Encounter (Signed)
Requesting a script for lovastatin (MEVACOR) 20 MG tablet   [facility] on [address]   [phone]

## 2015-02-07 ENCOUNTER — Other Ambulatory Visit: Payer: Self-pay | Admitting: Hematology and Oncology

## 2015-02-07 DIAGNOSIS — C50112 Malignant neoplasm of central portion of left female breast: Secondary | ICD-10-CM

## 2015-02-08 ENCOUNTER — Encounter: Payer: Self-pay | Admitting: Family Medicine

## 2015-02-08 DIAGNOSIS — N904 Leukoplakia of vulva: Secondary | ICD-10-CM | POA: Diagnosis not present

## 2015-02-08 DIAGNOSIS — N760 Acute vaginitis: Secondary | ICD-10-CM | POA: Diagnosis not present

## 2015-02-09 ENCOUNTER — Encounter: Payer: Medicare Other | Admitting: Family Medicine

## 2015-02-18 ENCOUNTER — Encounter: Payer: Medicare Other | Admitting: Family Medicine

## 2015-03-14 ENCOUNTER — Encounter: Payer: Medicare Other | Admitting: Family Medicine

## 2015-03-31 DIAGNOSIS — C50912 Malignant neoplasm of unspecified site of left female breast: Secondary | ICD-10-CM | POA: Diagnosis not present

## 2015-04-12 ENCOUNTER — Telehealth: Payer: Self-pay | Admitting: *Deleted

## 2015-04-12 NOTE — Telephone Encounter (Signed)
  Oncology Nurse Navigator Documentation  Navigator Location: [facility] (04/12/15 1123) Navigator Encounter Type: Telephone (left voice mail message) (04/12/15 1123)  I called patient to check in and left a voice mail message with my contact information and request that she return my call.           Treatment Phase: Follow-up (04/12/15 1123)

## 2015-04-18 ENCOUNTER — Telehealth: Payer: Self-pay | Admitting: *Deleted

## 2015-04-18 NOTE — Telephone Encounter (Signed)
  Oncology Nurse Navigator Documentation  Navigator Location: [facility] (04/18/15 1238) Navigator Encounter Type: Telephone (Left voice mail with contact informtion) (04/18/15 1238)  Called patient to check in.  Left a voice mail with contact information.           Treatment Phase: Follow-up (04/18/15 1238)

## 2015-04-19 DIAGNOSIS — N904 Leukoplakia of vulva: Secondary | ICD-10-CM | POA: Diagnosis not present

## 2015-04-20 ENCOUNTER — Telehealth: Payer: Self-pay | Admitting: *Deleted

## 2015-04-20 NOTE — Telephone Encounter (Signed)
  Oncology Nurse Navigator Documentation  Navigator Location: [facility] (04/20/15 1100) Navigator Encounter Type: Telephone (Patient returned my f/u call) (04/20/15 1100)  I had called patient to check in previously and she returned my call.  She reports that she has been doing well.  She denied any needs or concerns at this time.  I encouraged her to call me for any assistance.           Treatment Phase: Follow-up (04/20/15 1100) Barriers/Navigation Needs: No barriers at this time;No Questions;No Needs (04/20/15 1100)   Interventions: None required (04/20/15 1100)            Acuity: Level 1 (04/20/15 1100) Acuity Level 1: Minimal follow up required (04/20/15 1100)       Time Spent with Patient: 15 (04/20/15 1100)

## 2015-04-29 ENCOUNTER — Encounter: Payer: Medicare Other | Admitting: Family Medicine

## 2015-05-06 ENCOUNTER — Other Ambulatory Visit: Payer: Self-pay | Admitting: Family Medicine

## 2015-05-09 ENCOUNTER — Telehealth: Payer: Self-pay

## 2015-05-09 ENCOUNTER — Ambulatory Visit (INDEPENDENT_AMBULATORY_CARE_PROVIDER_SITE_OTHER): Payer: Medicare Other | Admitting: Emergency Medicine

## 2015-05-09 VITALS — BP 126/70 | HR 93 | Temp 99.6°F | Resp 16 | Wt 216.0 lb

## 2015-05-09 DIAGNOSIS — E119 Type 2 diabetes mellitus without complications: Secondary | ICD-10-CM

## 2015-05-09 DIAGNOSIS — J101 Influenza due to other identified influenza virus with other respiratory manifestations: Secondary | ICD-10-CM

## 2015-05-09 DIAGNOSIS — R05 Cough: Secondary | ICD-10-CM | POA: Diagnosis not present

## 2015-05-09 DIAGNOSIS — R059 Cough, unspecified: Secondary | ICD-10-CM

## 2015-05-09 DIAGNOSIS — R509 Fever, unspecified: Secondary | ICD-10-CM

## 2015-05-09 LAB — POCT CBC
Granulocyte percent: 69.5 %G (ref 37–80)
HCT, POC: 39.2 % (ref 37.7–47.9)
HEMOGLOBIN: 13.2 g/dL (ref 12.2–16.2)
LYMPH, POC: 1 (ref 0.6–3.4)
MCH, POC: 29.2 pg (ref 27–31.2)
MCHC: 33.6 g/dL (ref 31.8–35.4)
MCV: 86.8 fL (ref 80–97)
MID (cbc): 0.6 (ref 0–0.9)
MPV: 6.4 fL (ref 0–99.8)
PLATELET COUNT, POC: 240 10*3/uL (ref 142–424)
POC GRANULOCYTE: 3.6 (ref 2–6.9)
POC LYMPH %: 18.6 % (ref 10–50)
POC MID %: 11.9 %M (ref 0–12)
RBC: 4.51 M/uL (ref 4.04–5.48)
RDW, POC: 14 %
WBC: 5.2 10*3/uL (ref 4.6–10.2)

## 2015-05-09 LAB — POCT GLYCOSYLATED HEMOGLOBIN (HGB A1C): Hemoglobin A1C: 7

## 2015-05-09 LAB — POCT INFLUENZA A/B
INFLUENZA A, POC: POSITIVE — AB
Influenza B, POC: NEGATIVE

## 2015-05-09 LAB — GLUCOSE, POCT (MANUAL RESULT ENTRY): POC GLUCOSE: 145 mg/dL — AB (ref 70–99)

## 2015-05-09 MED ORDER — OSELTAMIVIR PHOSPHATE 75 MG PO CAPS: 75.0000 mg | ORAL_CAPSULE | Freq: Two times a day (BID) | ORAL | Status: DC

## 2015-05-09 NOTE — Progress Notes (Addendum)
By signing my name below, I, [name], attest that this documentation has been prepared under the direction and in the presence of [name], MD. Electronically Signed: [name], [employer]. 05/09/2015 , 10:35 AM .  Patient was seen in room 14 .  Chief Complaint:  Chief Complaint  Patient presents with  . ? flu    Friday pm    HPI: [name] is a 70 y.o. female who reports to [name] today complaining of possible flu that started 3 days ago. She states that it started with chills and a fever (tmax 101.8). She notes coughing a lot and also feels like she pulled something in her groin due to excessive coughing. She also mentions having some general myalgia. She denies receiving flu shot this year due to scheduling issues.   Past Medical History  Diagnosis Date  . Postmenopausal bleeding   . Fibroids   . Headache(784.0)     tension  . Seasonal allergies   . Anemia     due to vaginal bleeding  . Urinary urgency   . Hypertension     under control with med., has been on med. x 3 yr.  . White coat hypertension   . History of endometriosis   . Breast cancer ([place]) 08/2012    left  . Sleep apnea     uses CPAP nightly  . Arthritis     hands  . Dental crowns present   . Hyperlipidemia   . OSA (obstructive sleep apnea)   . S/P radiation therapy 10/20/2012-12/05/2012    left breast 50.4 gray, lumpectomy cavity boosted to 62.4 gray  . Allergy    Past Surgical History  Procedure Laterality Date  . Hysteroscopy w/d&c  05/06/2009  . Exploratory laparotomy  06-30-2007    ATTEMPTED HYSTERECTOMY ABORTED DUE TO EXTENSIVE ENDOMETRIOSIS W/ DENSE PELVIC ADHESIONS INVOLVING UTERUS AND LOWER RECTOSIGMOID  . Cholecystectomy  1995  . Colonoscopy w/ polypectomy  03/20/2007  . Dilation and curettage of uterus  2005  . Dilation and curettage of uterus  06/22/2011    Procedure: DILATATION AND CURETTAGE;  Surgeon: Alvino Chapel, MD;  Location: [name];   Service: Gynecology;  Laterality: N/A;  OK PER KEELA FOR 7:15 START  . Breast lumpectomy with needle localization and axillary sentinel lymph node bx Left 09/03/2012    Procedure: BREAST LUMPECTOMY WITH NEEDLE LOCALIZATION AND AXILLARY SENTINEL LYMPH NODE BX;  Surgeon: Edward Jolly, MD;  Location: [place];  Service: General;  Laterality: Left;  . Back surgery  1997    lumbar  . Dorsal compartment release Right 05/19/2013    Procedure: RELEASE 1ST  DORSAL COMPARTMENT RIGHT (DEQUERVAIN);  Surgeon: Cammie Sickle., MD;  Location: [facility];  Service: Orthopedics;  Laterality: Right;   Social History   Social History  . Marital Status: Married    Spouse Name: [name]  . Number of Children: 4  . Years of Education: College   Occupational History  . retired    Social History Main Topics  . Smoking status: Never Smoker   . Smokeless tobacco: Never Used  . Alcohol Use: No  . Drug Use: No  . Sexual Activity: Yes     Comment: menarche 68, P4, Premarin for short while, Provera x 3 years   Other Topics Concern  . None   Social History Narrative   Marital status: married x 7  years; happily married; no abuse.  Children: 4 children; six grandchildren; no gg.      Lives:  With husband.      Employed: retired in 2011; [employer].      Tobacco: none       Alcohol:  None      Drugs: none      Exercise:  Daily. Walking x 1 hour three days per week.      Seatbelt:  100%      Sunscreen:  Face SPF 15.      Guns:  Loaded mostly secured.      ADLs:  Drives; no assistant devices; independent with all ADLs.       Her nocturia is improved under CPAP use at 10 cm water- AHI is now 0.8 and from 30 at baseline. CMS compliance  6 hours 40 minutes - sleeps on the side, 08-10-11 .FFM - likes it.    . Flonase used prn.     Caffeine Use: 1 cup daily   Family History  Problem Relation Age of Onset  . Hypertension Other   . Vision loss  Mother   . Hypertension Mother   . Anesthesia problems Mother     post-op N/V  . Heart disease Mother   . Osteoporosis Mother   . Depression Father   . Heart disease Sister   . Hypertension Sister   . Hyperlipidemia Sister   . Diabetes Brother   . Hypertension Brother   . Cancer Brother     skin  . Hyperlipidemia Brother   . COPD Daughter   . Cancer Cousin   . Kidney failure Maternal Grandmother   . Heart disease Maternal Grandfather   . Colon cancer Neg Hx    Allergies  Allergen Reactions  . Latex Itching and Other (See Comments)    Causes blisters  . Adhesive [Tape] Rash   Prior to Admission medications   Medication Sig Start Date End Date Taking? Authorizing Provider  aspirin 81 MG tablet Take 81 mg by mouth daily.   Yes Historical Provider, MD  blood glucose meter kit and supplies KIT Test blood sugar once daily. Dx code: E11.9 06/23/14  Yes [name], MD  calcium citrate (CALCITRATE - DOSED IN MG ELEMENTAL CALCIUM) 950 MG tablet Take 200 mg of elemental calcium by mouth daily.   Yes Historical Provider, MD  Cholecalciferol (VITAMIN D-3 PO) Take 400 mg by mouth daily.   Yes Historical Provider, MD  Cyanocobalamin (VITAMIN B-12 CR PO) Take 500 mcg by mouth daily.    Yes Historical Provider, MD  fluticasone (FLONASE) 50 MCG/ACT nasal spray Place 2 sprays into both nostrils daily. As needed 03/23/13  Yes [name], MD  Glucose Blood (BLOOD GLUCOSE TEST STRIPS) STRP Test blood sugar once daily. Dx code: E11.9 06/23/14  Yes [name], MD  Lancets MISC Check sugar once daily. Dx code: E11.9 06/23/14  Yes [name], MD  letrozole [facility]) 2.5 MG tablet TAKE 1 TABLET EVERY DAY 02/07/15  Yes Nicholas Lose, MD  lisinopril-hydrochlorothiazide (PRINZIDE,ZESTORETIC) 10-12.5 MG tablet TAKE ONE TABLET BY MOUTH ONCE DAILY. 12/30/14  Yes [name], MD  Loratadine-Pseudoephedrine [name] ALLERGY RELIEF D, LORATID, PO) Take by mouth as needed.   Yes Historical Provider, MD    lovastatin (MEVACOR) 20 MG tablet TAKE ONE TABLET BY MOUTH AT BEDTIME 05/06/15  Yes [name], MD  metFORMIN (GLUCOPHAGE) 500 MG tablet Take 1 tablet (500 mg total) by mouth 2 (two) times daily with  a meal. 10/18/14  Yes [name], MD  methocarbamol (ROBAXIN) 500 MG tablet Take 1 tablet (500 mg total) by mouth 4 (four) times daily. 06/21/14  Yes [name], MD  Omega-3 Fatty Acids (FISH OIL) 1000 MG CAPS Take 1 capsule by mouth daily.   Yes Historical Provider, MD  OVER THE COUNTER MEDICATION daily.   Yes Historical Provider, MD  Turmeric 500 MG CAPS Take 500 mg by mouth daily.   Yes Historical Provider, MD  vitamin C (ASCORBIC ACID) 500 MG tablet Take 1,000 mg by mouth daily.    Yes Historical Provider, MD  Zinc 50 MG TABS Take 1 tablet by mouth daily.   Yes Historical Provider, MD     ROS:  Constitutional: negative for night sweats, weight changes, or fatigue; positive for fever, chills HEENT: negative for vision changes, hearing loss, congestion, rhinorrhea, ST, epistaxis, or sinus pressure Cardiovascular: negative for chest pain or palpitations Respiratory: negative for hemoptysis, wheezing, shortness of breath; positive for cough Abdominal: negative for abdominal pain, nausea, vomiting, diarrhea, or constipation Dermatological: negative for rash Musc: positive for myalgia Neurologic: negative for headache, dizziness, or syncope All other systems reviewed and are otherwise negative with the exception to those above and in the HPI.  PHYSICAL EXAM: Filed Vitals:   05/09/15 1018  BP: 126/70  Pulse: 93  Temp: 99.6 F (37.6 C)  Resp: 16   Body mass index is 40.83 kg/(m^2).   General: Alert, no acute distress, ill, not toxic, no distress HEENT:  Normocephalic, atraumatic, oropharynx patent; significant nasal congestion Eye: EOMI, PEERLDC Cardiovascular:  Regular rate and rhythm, no rubs murmurs or gallops.  No Carotid bruits, radial pulse intact. No pedal edema.   Respiratory: coarse rhonchi bilaterally, No rales.  No cyanosis, no use of accessory musculature Abdominal: No organomegaly, abdomen is soft and non-tender, positive bowel sounds. No masses. Musculoskeletal: Gait intact. No edema, tenderness Skin: No rashes. Neurologic: Facial musculature symmetric. Psychiatric: Patient acts appropriately throughout our interaction.  Lymphatic: No cervical or submandibular lymphadenopathy Genitourinary/Anorectal: No acute findings   LABS: Results for orders placed or performed in visit on 05/09/15  POCT Influenza A/B  Result Value Ref Range   Influenza A, POC Positive (A) Negative   Influenza B, POC Negative Negative  POCT CBC  Result Value Ref Range   WBC 5.2 4.6 - 10.2 K/uL   Lymph, poc 1.0 0.6 - 3.4   POC LYMPH PERCENT 18.6 10 - 50 %L   MID (cbc) 0.6 0 - 0.9   POC MID % 11.9 0 - 12 %M   POC Granulocyte 3.6 2 - 6.9   Granulocyte percent 69.5 37 - 80 %G   RBC 4.51 4.04 - 5.48 M/uL   Hemoglobin 13.2 12.2 - 16.2 g/dL   HCT, POC 39.2 37.7 - 47.9 %   MCV 86.8 80 - 97 fL   MCH, POC 29.2 27 - 31.2 pg   MCHC 33.6 31.8 - 35.4 g/dL   RDW, POC 14.0 %   Platelet Count, POC 240 142 - 424 K/uL   MPV 6.4 0 - 99.8 fL  POCT glucose (manual entry)  Result Value Ref Range   POC Glucose 145 (A) 70 - 99 mg/dl  POCT glycosylated hemoglobin (Hb A1C)  Result Value Ref Range   Hemoglobin A1C 7.0    .  EKG/XRAY:   Primary read interpreted by Dr. Everlene Farrier at [name].   ASSESSMENT/PLAN: We'll treat with Tamiflu for 5 days. Hemoglobin A1c is 7. Her  last hemoglobin A1c was 6.7. Will not make adjustments with this at the present time.I personally performed the services described in this documentation, which was scribed in my presence. The recorded information has been reviewed and is accurate.   Gross sideeffects, risk and benefits, and alternatives of medications d/w patient. Patient is aware that all medications have potential sideeffects and we are unable to  predict every sideeffect or drug-drug interaction that may occur.  [name] MD 05/09/2015 10:35 AM

## 2015-05-09 NOTE — Telephone Encounter (Signed)
Pt is having a problem gettng the tamiflu-it is too expensive and would like to have [place] either locate something cheaper or try callilng it in to [employer] number [phone]

## 2015-05-09 NOTE — Patient Instructions (Signed)

## 2015-05-10 ENCOUNTER — Encounter: Payer: Medicare Other | Admitting: Family Medicine

## 2015-05-10 LAB — BASIC METABOLIC PANEL WITH GFR
BUN: 13 mg/dL (ref 7–25)
CO2: 26 mmol/L (ref 20–31)
Calcium: 9.8 mg/dL (ref 8.6–10.4)
Chloride: 99 mmol/L (ref 98–110)
Creat: 0.69 mg/dL (ref 0.60–0.93)
GFR, Est Non African American: 88 mL/min (ref >=60)
Glucose, Bld: 133 mg/dL — ABNORMAL HIGH (ref 65–99)
Potassium: 3.9 mmol/L (ref 3.5–5.3)
SODIUM: 136 mmol/L (ref 135–146)

## 2015-05-10 NOTE — Telephone Encounter (Signed)
Patient was unable to get the tamiflu filled due to the cost.  She is coughing and feels like she is choking from the cough.  Would like to know if she should take thera flu or should she get robitussin or something over the counter.  Please advise.

## 2015-05-11 NOTE — Telephone Encounter (Signed)
Pt is starting to feel a lot better. No longer with fevers. Cough is getting better. Doesn't feel she needs anything anymore.

## 2015-05-25 ENCOUNTER — Telehealth: Payer: Self-pay

## 2015-05-25 MED ORDER — LOVASTATIN 20 MG PO TABS: 20.0000 mg | ORAL_TABLET | Freq: Every day | ORAL | Status: DC

## 2015-05-25 NOTE — Telephone Encounter (Signed)
Rx sent 

## 2015-05-25 NOTE — Telephone Encounter (Signed)
Patient needs a refill a refill for her cholesterol medication. Patient states that every time she makes an appointment we cancel it and that makes it more difficult to keep up with getting refills.  Patient phone: [phone] or [phone]

## 2015-05-31 ENCOUNTER — Encounter: Payer: Self-pay | Admitting: Family Medicine

## 2015-05-31 ENCOUNTER — Telehealth: Payer: Self-pay | Admitting: Family Medicine

## 2015-05-31 ENCOUNTER — Ambulatory Visit (INDEPENDENT_AMBULATORY_CARE_PROVIDER_SITE_OTHER): Payer: Medicare Other | Admitting: Family Medicine

## 2015-05-31 VITALS — BP 128/68 | HR 88 | Temp 98.1°F | Resp 16 | Ht 61.25 in | Wt 220.0 lb

## 2015-05-31 DIAGNOSIS — E119 Type 2 diabetes mellitus without complications: Secondary | ICD-10-CM

## 2015-05-31 DIAGNOSIS — M8580 Other specified disorders of bone density and structure, unspecified site: Secondary | ICD-10-CM | POA: Diagnosis not present

## 2015-05-31 DIAGNOSIS — Z23 Encounter for immunization: Secondary | ICD-10-CM

## 2015-05-31 DIAGNOSIS — E785 Hyperlipidemia, unspecified: Secondary | ICD-10-CM | POA: Diagnosis not present

## 2015-05-31 DIAGNOSIS — G4733 Obstructive sleep apnea (adult) (pediatric): Secondary | ICD-10-CM | POA: Diagnosis not present

## 2015-05-31 DIAGNOSIS — J301 Allergic rhinitis due to pollen: Secondary | ICD-10-CM | POA: Diagnosis not present

## 2015-05-31 DIAGNOSIS — I10 Essential (primary) hypertension: Secondary | ICD-10-CM | POA: Diagnosis not present

## 2015-05-31 DIAGNOSIS — C50112 Malignant neoplasm of central portion of left female breast: Secondary | ICD-10-CM | POA: Diagnosis not present

## 2015-05-31 DIAGNOSIS — Z1159 Encounter for screening for other viral diseases: Secondary | ICD-10-CM | POA: Diagnosis not present

## 2015-05-31 DIAGNOSIS — Z9989 Dependence on other enabling machines and devices: Secondary | ICD-10-CM

## 2015-05-31 MED ORDER — LOVASTATIN 40 MG PO TABS: 40.0000 mg | ORAL_TABLET | Freq: Every day | ORAL | Status: DC

## 2015-05-31 MED ORDER — METFORMIN HCL ER 500 MG PO TB24: 2000.0000 mg | ORAL_TABLET | Freq: Every day | ORAL | Status: DC

## 2015-05-31 NOTE — Progress Notes (Signed)
Subjective:    Patient ID: [name], female    DOB: 12-03-1945, 70 y.o.   MRN: [id number]  05/31/2015  med refill and Flu injection   HPI This 70 y.o. female presents for six month follow-up:  1.  HTN: Patient reports good compliance with medication, good tolerance to medication, and good symptom control.    2.  Hyperlipidemia: Patient reports good compliance with medication, good tolerance to medication, and good symptom control.  Taking cholesterol medication most mornings.    3.  DMII: Patient reports good compliance with medication, good tolerance to medication, and good symptom control.  Sugars running poorly; has not been good; not checking sugars regularly; fasting 130-140.  Metformin 577m bid.  Not exercising.  Does not want to swim.  Cannot get husband to go to the gym.    4. L breast cancer invasive lobular cancer ER/PR positive HER-2 negative; s/p lumpectomy and radiation and currently on Arimidex started 12/11/2012.  Last visit 12/2014; six month follow-up.     Review of Systems  Constitutional: Negative for fever, chills, diaphoresis and fatigue.  Eyes: Negative for visual disturbance.  Respiratory: Negative for cough and shortness of breath.   Cardiovascular: Negative for chest pain, palpitations and leg swelling.  Gastrointestinal: Negative for nausea, vomiting, abdominal pain, diarrhea and constipation.  Endocrine: Negative for cold intolerance, heat intolerance, polydipsia, polyphagia and polyuria.  Neurological: Negative for dizziness, tremors, seizures, syncope, facial asymmetry, speech difficulty, weakness, light-headedness, numbness and headaches.    Past Medical History  Diagnosis Date  . Postmenopausal bleeding   . Fibroids   . Headache(784.0)     tension  . Seasonal allergies   . Anemia     due to vaginal bleeding  . Urinary urgency   . Hypertension     under control with med., has been on med. x 3 yr.  . White coat hypertension   . History  of endometriosis   . Breast cancer ([facility] 08/2012    left  . Sleep apnea     uses CPAP nightly  . Arthritis     hands  . Dental crowns present   . Hyperlipidemia   . OSA (obstructive sleep apnea)   . S/P radiation therapy 10/20/2012-12/05/2012    left breast 50.4 gray, lumpectomy cavity boosted to 62.4 gray  . Allergy    Past Surgical History  Procedure Laterality Date  . Hysteroscopy w/d&c  05/06/2009  . Exploratory laparotomy  06-30-2007    ATTEMPTED HYSTERECTOMY ABORTED DUE TO EXTENSIVE ENDOMETRIOSIS W/ DENSE PELVIC ADHESIONS INVOLVING UTERUS AND LOWER RECTOSIGMOID  . Cholecystectomy  1995  . Colonoscopy w/ polypectomy  03/20/2007  . Dilation and curettage of uterus  2005  . Dilation and curettage of uterus  06/22/2011    Procedure: DILATATION AND CURETTAGE;  Surgeon: DAlvino Chapel MD;  [facility]  Service: Gynecology;  Laterality: N/A;  OK PER KEELA FOR 7:15 START  . Breast lumpectomy with needle localization and axillary sentinel lymph node bx Left 09/03/2012    Procedure: BREAST LUMPECTOMY WITH NEEDLE LOCALIZATION AND AXILLARY SENTINEL LYMPH NODE BX;  Surgeon: BEdward Jolly MD;  [place] [facility]  Service: General;  Laterality: Left;  . Back surgery  1997    lumbar  . Dorsal compartment release Right 05/19/2013    Procedure: RELEASE 1ST  DORSAL COMPARTMENT RIGHT (DEQUERVAIN);  Surgeon: RCammie Sickle, MD;  [facility]  Service: Orthopedics;  Laterality: Right;  Allergies  Allergen Reactions  . Latex Itching and Other (See Comments)    Causes blisters  . Adhesive [Tape] Rash    Social History   Social History  . Marital Status: Married    Spouse Name: [name]  . Number of Children: 4  . Years of Education: College   Occupational History  . retired    Social History Main Topics  . Smoking status: Never Smoker   . Smokeless tobacco: Never Used  . Alcohol Use: No  . Drug Use: No    . Sexual Activity: Yes     Comment: menarche 71, P4, Premarin for short while, Provera x 3 years   Other Topics Concern  . Not on file   Social History Narrative   Marital status: married x 86  years; happily married; no abuse.      Children: 4 children; six grandchildren; no gg.      Lives:  With husband.      Employed: retired in 2011; [employer].      Tobacco: none       Alcohol:  None      Drugs: none      Exercise:  Daily. Walking x 1 hour three days per week.      Seatbelt:  100%      Sunscreen:  Face SPF 15.      Guns:  Loaded mostly secured.      ADLs:  Drives; no assistant devices; independent with all ADLs.       Her nocturia is improved under CPAP use at 10 cm water- AHI is now 0.8 and from 30 at baseline. CMS compliance  6 hours 40 minutes - sleeps on the side, 08-10-11 .FFM - likes it.    . Flonase used prn.     Caffeine Use: 1 cup daily   Family History  Problem Relation Age of Onset  . Hypertension Other   . Vision loss Mother   . Hypertension Mother   . Anesthesia problems Mother     post-op N/V  . Heart disease Mother   . Osteoporosis Mother   . Depression Father   . Heart disease Sister   . Hypertension Sister   . Hyperlipidemia Sister   . Diabetes Brother   . Hypertension Brother   . Cancer Brother     skin  . Hyperlipidemia Brother   . COPD Daughter   . Cancer Cousin   . Kidney failure Maternal Grandmother   . Heart disease Maternal Grandfather   . Colon cancer Neg Hx        Objective:    BP 128/68 mmHg  Pulse 88  Temp(Src) 98.1 F (36.7 C) (Oral)  Resp 16  Ht 5' 1.25" (1.556 m)  Wt 220 lb (99.791 kg)  BMI 41.22 kg/m2  SpO2 98% Physical Exam  Constitutional: She is oriented to person, place, and time. She appears well-developed and well-nourished. No distress.  HENT:  Head: Normocephalic and atraumatic.  Right Ear: External ear normal.  Left Ear: External ear normal.  Nose: Nose normal.  Mouth/Throat:  Oropharynx is clear and moist.  Eyes: Conjunctivae and EOM are normal. Pupils are equal, round, and reactive to light.  Neck: Normal range of motion. Neck supple. Carotid bruit is not present. No thyromegaly present.  Cardiovascular: Normal rate, regular rhythm, normal heart sounds and intact distal pulses.  Exam reveals no gallop and no friction rub.   No murmur heard. Pulmonary/Chest: Effort normal and breath sounds  normal. She has no wheezes. She has no rales.  Abdominal: Soft. Bowel sounds are normal. She exhibits no distension and no mass. There is no tenderness. There is no rebound and no guarding.  Lymphadenopathy:    She has no cervical adenopathy.  Neurological: She is alert and oriented to person, place, and time. No cranial nerve deficit.  Skin: Skin is warm and dry. No rash noted. She is not diaphoretic. No erythema. No pallor.  Psychiatric: She has a normal mood and affect. Her behavior is normal.        Assessment & Plan:   1. Essential hypertension   2. Obstructive sleep apnea on CPAP   3. Type 2 diabetes mellitus without complication, without long-term current use of insulin ([place])   4. Allergic rhinitis due to pollen   5. Osteopenia   6. Cancer of central portion of left female breast ([place])   7. Hyperlipidemia   8. Need for hepatitis C screening test   9. Flu vaccine need     Orders Placed This Encounter  Procedures  . Flu Vaccine QUAD 36+ mos IM  . CBC with Differential/Platelet  . Comprehensive metabolic panel    Order Specific Question:  Has the patient fasted?    Answer:  Yes  . Lipid panel    Order Specific Question:  Has the patient fasted?    Answer:  Yes  . Hepatitis C antibody  . HM Diabetes Foot Exam   Meds ordered this encounter  Medications  . metFORMIN (GLUCOPHAGE-XR) 500 MG 24 hr tablet    Sig: Take 4 tablets (2,000 mg total) by mouth daily with breakfast.    Dispense:  360 tablet    Refill:  3  . lovastatin (MEVACOR) 40 MG tablet    Sig:  Take 1 tablet (40 mg total) by mouth at bedtime.    Dispense:  90 tablet    Refill:  1    Return in about 6 months (around 12/01/2015) for complete physical examiniation.    [name] Elayne Guerin, M.D. [facility] [address] [place], [place]  [address] [phone] phone [phone] fax

## 2015-05-31 NOTE — Patient Instructions (Signed)

## 2015-05-31 NOTE — Telephone Encounter (Signed)
Patient will be coming in today for her flu shot and medication refills.

## 2015-05-31 NOTE — Telephone Encounter (Signed)
Spoke with patient about her flu shot.  She expressed concern about how we have cancelled several appointments on her, so she has been unable to obtain the flu shot or get her refills or blood work.  After speaking with Dr. Tamala Julian, we were able to get her in today at 4:30 to see Dr. Tamala Julian to have these things done.  Patient was satisfied with this and appreciated [place] accomedating her.

## 2015-06-01 LAB — CBC WITH DIFFERENTIAL/PLATELET
BASOS ABS: 0.1 10*3/uL (ref 0.0–0.1)
Basophils Relative: 1 % (ref 0–1)
EOS ABS: 0.3 10*3/uL (ref 0.0–0.7)
Eosinophils Relative: 5 % (ref 0–5)
HCT: 38.6 % (ref 36.0–46.0)
Hemoglobin: 13.1 g/dL (ref 12.0–15.0)
LYMPHS ABS: 1.7 10*3/uL (ref 0.7–4.0)
Lymphocytes Relative: 27 % (ref 12–46)
MCH: 29.3 pg (ref 26.0–34.0)
MCHC: 33.9 g/dL (ref 30.0–36.0)
MCV: 86.4 fL (ref 78.0–100.0)
MPV: 9.6 fL (ref 8.6–12.4)
Monocytes Absolute: 0.4 10*3/uL (ref 0.1–1.0)
Monocytes Relative: 7 % (ref 3–12)
NEUTROS PCT: 60 % (ref 43–77)
Neutro Abs: 3.8 10*3/uL (ref 1.7–7.7)
PLATELETS: 286 10*3/uL (ref 150–400)
RBC: 4.47 MIL/uL (ref 3.87–5.11)
RDW: 13.4 % (ref 11.5–15.5)
WBC: 6.4 10*3/uL (ref 4.0–10.5)

## 2015-06-01 LAB — COMPREHENSIVE METABOLIC PANEL
ALT: 32 U/L — AB (ref 6–29)
AST: 28 U/L (ref 10–35)
Albumin: 4.1 g/dL (ref 3.6–5.1)
Alkaline Phosphatase: 91 U/L (ref 33–130)
BUN: 12 mg/dL (ref 7–25)
CO2: 25 mmol/L (ref 20–31)
Calcium: 9.7 mg/dL (ref 8.6–10.4)
Chloride: 102 mmol/L (ref 98–110)
Creat: 0.66 mg/dL (ref 0.60–0.93)
Glucose, Bld: 119 mg/dL — ABNORMAL HIGH (ref 65–99)
Potassium: 3.8 mmol/L (ref 3.5–5.3)
Sodium: 138 mmol/L (ref 135–146)
TOTAL PROTEIN: 7.3 g/dL (ref 6.1–8.1)
Total Bilirubin: 0.6 mg/dL (ref 0.2–1.2)

## 2015-06-01 LAB — LIPID PANEL
CHOLESTEROL: 195 mg/dL (ref 125–200)
HDL: 48 mg/dL (ref >=46)
LDL Cholesterol: 124 mg/dL (ref <130)
Total CHOL/HDL Ratio: 4.1 Ratio (ref <=5.0)
Triglycerides: 113 mg/dL (ref <150)
VLDL: 23 mg/dL (ref <30)

## 2015-06-02 LAB — HEPATITIS C ANTIBODY: HCV Ab: NEGATIVE

## 2015-06-08 ENCOUNTER — Telehealth: Payer: Self-pay

## 2015-06-08 NOTE — Telephone Encounter (Signed)
Pt thinks that her medicine the Metformin 500 mg is giving her some serious issues. She would like a call back about these symptoms she is having bad stomach cramps, back pain, wheezing and she has been using the bathroom constantly. Please call patient as soon as possible and advise her what to do.

## 2015-06-08 NOTE — Telephone Encounter (Signed)
Left message to advise pt to come in to be evaluated.

## 2015-06-09 NOTE — Telephone Encounter (Signed)
Please call patient again -----have her HOLD the Metformin.  Metformin can cause stomach cramping and diarrhea/loose stools/frequent stools but should NOT be causing back pain or wheezing.  Recommend evaluation for wheezing and/or back pain.  Is she taking Metformin WITH largest meal of the day?  Is she taking all four at once?

## 2015-06-09 NOTE — Telephone Encounter (Signed)
Patient stated she is taking Metformin in the morning with breakfast. She does eat a pretty good size breakfast. Patient is not wheezing today. Patient request to speak with Dr. Tamala Julian directly. Please call after 3pm [phone].

## 2015-06-10 NOTE — Telephone Encounter (Signed)
Spoke with patient --- feeling horrible; having diarrhea, weak, fatigue.  Went back to original Metformin 500mg  one bid.  Feeling better not taking Metformin ER for the past two days. A/P: DMII: decrease Metformin ER to 500mg  two daily with breakfast.   Pt agreeable.

## 2015-06-27 DIAGNOSIS — D485 Neoplasm of uncertain behavior of skin: Secondary | ICD-10-CM | POA: Diagnosis not present

## 2015-06-27 DIAGNOSIS — C44519 Basal cell carcinoma of skin of other part of trunk: Secondary | ICD-10-CM | POA: Diagnosis not present

## 2015-06-27 DIAGNOSIS — D1801 Hemangioma of skin and subcutaneous tissue: Secondary | ICD-10-CM | POA: Diagnosis not present

## 2015-06-27 DIAGNOSIS — L570 Actinic keratosis: Secondary | ICD-10-CM | POA: Diagnosis not present

## 2015-06-27 DIAGNOSIS — D225 Melanocytic nevi of trunk: Secondary | ICD-10-CM | POA: Diagnosis not present

## 2015-06-27 DIAGNOSIS — L814 Other melanin hyperpigmentation: Secondary | ICD-10-CM | POA: Diagnosis not present

## 2015-06-27 DIAGNOSIS — L821 Other seborrheic keratosis: Secondary | ICD-10-CM | POA: Diagnosis not present

## 2015-07-04 ENCOUNTER — Ambulatory Visit: Payer: Medicare Other | Admitting: Hematology and Oncology

## 2015-07-04 DIAGNOSIS — C44519 Basal cell carcinoma of skin of other part of trunk: Secondary | ICD-10-CM | POA: Diagnosis not present

## 2015-07-04 DIAGNOSIS — D485 Neoplasm of uncertain behavior of skin: Secondary | ICD-10-CM | POA: Diagnosis not present

## 2015-07-04 DIAGNOSIS — L089 Local infection of the skin and subcutaneous tissue, unspecified: Secondary | ICD-10-CM | POA: Diagnosis not present

## 2015-07-07 ENCOUNTER — Telehealth: Payer: Self-pay | Admitting: Hematology and Oncology

## 2015-07-07 ENCOUNTER — Encounter: Payer: Self-pay | Admitting: *Deleted

## 2015-07-07 ENCOUNTER — Encounter: Payer: Self-pay | Admitting: Hematology and Oncology

## 2015-07-07 ENCOUNTER — Ambulatory Visit (HOSPITAL_BASED_OUTPATIENT_CLINIC_OR_DEPARTMENT_OTHER): Payer: Medicare Other | Admitting: Hematology and Oncology

## 2015-07-07 VITALS — BP 144/74 | HR 90 | Temp 98.0°F | Resp 19 | Wt 220.3 lb

## 2015-07-07 DIAGNOSIS — Z170 Estrogen receptor positive status [ER+]: Secondary | ICD-10-CM | POA: Diagnosis not present

## 2015-07-07 DIAGNOSIS — M8580 Other specified disorders of bone density and structure, unspecified site: Secondary | ICD-10-CM

## 2015-07-07 DIAGNOSIS — C50112 Malignant neoplasm of central portion of left female breast: Secondary | ICD-10-CM | POA: Diagnosis not present

## 2015-07-07 DIAGNOSIS — Z79811 Long term (current) use of aromatase inhibitors: Secondary | ICD-10-CM | POA: Diagnosis not present

## 2015-07-07 MED ORDER — TAMOXIFEN CITRATE 20 MG PO TABS: 20.0000 mg | ORAL_TABLET | Freq: Every day | ORAL | Status: DC

## 2015-07-07 NOTE — Progress Notes (Signed)
Patient Care Team: [name], MD as PCP - General (Family Medicine) [name], MD (Family Medicine)  DIAGNOSIS: Cancer of central portion of left female breast [facility])   Staging form: Breast, AJCC 7th Edition     Clinical: Stage IA (T1c, N0, cM0) - Unsigned       Staging comments: Staged at breast conference 6.11.14      Pathologic: No stage assigned - Unsigned   SUMMARY OF ONCOLOGIC HISTORY:   Cancer of central portion of left female breast ([place])   08/13/2012 Mammogram Questionable mass deep to the nipple superior periareolar region with calcifications.U/S 5 x 8 mm mass at 12:00, ovoid cystic lesion at 11:00 1.5X 0.4 cm   08/25/2012 Initial Diagnosis Invasive mammary cancer with mammary cancer in situ grade 2 with lobular features ER 100%, PR 63%, Ki-67 58%, HER-2 positive ratio 2.48   08/26/2012 Breast MRI Dominant, biopsied mass with small immediately adjacent satellite mass consistent with biopsy results indicating malignancy, measuring 17 x 11 x 28m   09/03/2012 Surgery Left breast lumpectomy invasive lobular cancer with lymphovascular invasion and lobular carcinoma in situ positive margins one SLN negative, grade 2; 1.6 cm, ER 100%, PR 63%, HER-2 positive, Ki-67 58% T1 C. N0 M0 stage IA   10/21/2012 - 11/28/2012 Radiation Therapy Radiation therapy to the lumpectomy site   12/01/2012 -  Anti-estrogen oral therapy Arimidex 1 mg daily planned duration of treatment 5 years, switched to letrozole January 2016 for musculoskeletal aches and pains, switched to Tamoxifen 07/07/15    CHIEF COMPLIANT: muscular skeletal aches and pains related to letrozole  INTERVAL HISTORY: BPELAGIA IACOBUCCIis a 70year-old with above-mentioned history of left breast cancer currently on antiestrogen therapy with letrozole. She could not tolerate Arimidex and we are on letrozole. Previously we have tried to prescribe her Aromasin but because it was expensive we could not fill the prescription. She went back to  taking letrozole. She continues to have musculoskeletal aches and pains what her body. It has been affecting her quality of life.  REVIEW OF SYSTEMS:   Constitutional: Denies fevers, chills or abnormal weight loss Eyes: Denies blurriness of vision Ears, nose, mouth, throat, and face: Denies mucositis or sore throat Respiratory: Denies cough, dyspnea or wheezes Cardiovascular: Denies palpitation, chest discomfort Gastrointestinal:  Denies nausea, heartburn or change in bowel habits Skin: Denies abnormal skin rashes Lymphatics: Denies new lymphadenopathy or easy bruising Neurological:Denies numbness, tingling or new weaknesses Behavioral/Psych: Mood is stable, no new changes  Extremities: No lower extremity edema Breast:  denies any pain or lumps or nodules in either breasts All other systems were reviewed with the patient and are negative.  I have reviewed the past medical history, past surgical history, social history and family history with the patient and they are unchanged from previous note.  ALLERGIES:  is allergic to latex and adhesive.  MEDICATIONS:  Current Outpatient Prescriptions  Medication Sig Dispense Refill  . aspirin 81 MG tablet Take 81 mg by mouth daily.    . blood glucose meter kit and supplies KIT Test blood sugar once daily. Dx code: E11.9 1 each 0  . calcium citrate (CALCITRATE - DOSED IN MG ELEMENTAL CALCIUM) 950 MG tablet Take 200 mg of elemental calcium by mouth daily.    . Cholecalciferol (VITAMIN D-3 PO) Take 400 mg by mouth daily.    . Cyanocobalamin (VITAMIN B-12 CR PO) Take 500 mcg by mouth daily.     . fluticasone (FLONASE) 50 MCG/ACT nasal spray  Place 2 sprays into both nostrils daily. As needed 16 g 11  . Glucose Blood (BLOOD GLUCOSE TEST STRIPS) STRP Test blood sugar once daily. Dx code: E11.9 100 each 3  . Lancets MISC Check sugar once daily. Dx code: E11.9 100 each 3  . letrozole (FEMARA) 2.5 MG tablet TAKE 1 TABLET EVERY DAY 90 tablet 3  .  lisinopril-hydrochlorothiazide (PRINZIDE,ZESTORETIC) 10-12.5 MG tablet TAKE ONE TABLET BY MOUTH ONCE DAILY. 90 tablet 1  . Loratadine-Pseudoephedrine (PX ALLERGY RELIEF D, LORATID, PO) Take by mouth as needed.    . lovastatin (MEVACOR) 40 MG tablet Take 1 tablet (40 mg total) by mouth at bedtime. 90 tablet 1  . metFORMIN (GLUCOPHAGE-XR) 500 MG 24 hr tablet Take 4 tablets (2,000 mg total) by mouth daily with breakfast. 360 tablet 3  . methocarbamol (ROBAXIN) 500 MG tablet Take 1 tablet (500 mg total) by mouth 4 (four) times daily. (Patient not taking: Reported on 05/31/2015) 60 tablet 0  . Omega-3 Fatty Acids (FISH OIL) 1000 MG CAPS Take 1 capsule by mouth daily.    Marland Kitchen oseltamivir (TAMIFLU) 75 MG capsule Take 1 capsule (75 mg total) by mouth 2 (two) times daily. (Patient not taking: Reported on 05/31/2015) 10 capsule 0  . OVER THE COUNTER MEDICATION daily.    . tamoxifen (NOLVADEX) 20 MG tablet Take 1 tablet (20 mg total) by mouth daily. 90 tablet 3  . Turmeric 500 MG CAPS Take 500 mg by mouth daily.    . vitamin C (ASCORBIC ACID) 500 MG tablet Take 1,000 mg by mouth daily.     . Zinc 50 MG TABS Take 1 tablet by mouth daily.    . [DISCONTINUED] LISINOPRIL PO Take 1 tablet by mouth daily.     No current facility-administered medications for this visit.    PHYSICAL EXAMINATION: ECOG PERFORMANCE STATUS: 1 - Symptomatic but completely ambulatory  Filed Vitals:   07/07/15 0858  BP: 144/74  Pulse: 90  Temp: 98 F (36.7 C)  Resp: 19   Filed Weights   07/07/15 0858  Weight: 220 lb 4.8 oz (99.927 kg)    GENERAL:alert, no distress and comfortable SKIN: skin color, texture, turgor are normal, no rashes or significant lesions EYES: normal, Conjunctiva are pink and non-injected, sclera clear OROPHARYNX:no exudate, no erythema and lips, buccal mucosa, and tongue normal  NECK: supple, thyroid normal size, non-tender, without nodularity LYMPH:  no palpable lymphadenopathy in the cervical, axillary  or inguinal LUNGS: clear to auscultation and percussion with normal breathing effort HEART: regular rate & rhythm and no murmurs and no lower extremity edema ABDOMEN:abdomen soft, non-tender and normal bowel sounds MUSCULOSKELETAL:no cyanosis of digits and no clubbing  NEURO: alert & oriented x 3 with fluent speech, no focal motor/sensory deficits EXTREMITIES: No lower extremity edema LABORATORY DATA:  I have reviewed the data as listed   Chemistry      Component Value Date/Time   NA 138 05/31/2015 1734   NA 136 06/24/2014 0757   K 3.8 05/31/2015 1734   K 4.3 06/24/2014 0757   CL 102 05/31/2015 1734   CL 104 08/27/2012 1219   CO2 25 05/31/2015 1734   CO2 25 06/24/2014 0757   BUN 12 05/31/2015 1734   BUN 14.3 06/24/2014 0757   CREATININE 0.66 05/31/2015 1734   CREATININE 0.8 06/24/2014 0757   CREATININE 0.72 05/13/2013 1430      Component Value Date/Time   CALCIUM 9.7 05/31/2015 1734   CALCIUM 9.8 06/24/2014 0757   ALKPHOS 91  05/31/2015 1734   ALKPHOS 157* 06/24/2014 0757   AST 28 05/31/2015 1734   AST 16 06/24/2014 0757   ALT 32* 05/31/2015 1734   ALT 19 06/24/2014 0757   BILITOT 0.6 05/31/2015 1734   BILITOT 0.45 06/24/2014 0757       Lab Results  Component Value Date   WBC 6.4 05/31/2015   HGB 13.1 05/31/2015   HCT 38.6 05/31/2015   MCV 86.4 05/31/2015   PLT 286 05/31/2015   NEUTROABS 3.8 05/31/2015   ASSESSMENT & PLAN:  Cancer of central portion of left female breast Left breast invasive lobular cancer ER/PR positive HER-2 negative (initial biopsy showed HER-2 positive but final pathology on the lumpectomy did not) status post left breast lumpectomy and radiation and currently on antiestrogen therapy with Arimidex started 12/01/2012.  Arimidex toxicities: severe musculoskeletal aches and pains. After lengthy discussion, we elected to switch her to tamoxifen therapy. I discussed the risks and benefits of tamoxifen the patient including risk of DVT and uterine  bleeding.  Right humerus fracture: after fall. Much improved with physical therapy.  Breast cancer surveillance: 1. Breast exam 12/30/2014 is normal 2. Mammogram 12/21/2014 is normal breast density category B 3. Bone density June 2016: T score -1.4  Hypercalcemia: She has had episodic hypercalcemia. PTH level was normal April 2016 (52). Since we stopped calcium supplementation, her calcium levels have come down. She was restarted on calcium supplementation because of osteopenia T score -1.4  Return to clinic in 4 months to assess tolerability tamoxifen therapy.   No orders of the defined types were placed in this encounter.   The patient has a good understanding of the overall plan. she agrees with it. she will call with any problems that may develop before the next visit here.   Rulon Eisenmenger, MD 07/07/2015

## 2015-07-07 NOTE — Progress Notes (Signed)
  Oncology Nurse Navigator Documentation  Navigator Location: [facility]-Med Onc (07/07/15 0900) Navigator Encounter Type: Follow-up Appt (07/07/15 0900)  Patient reports that she continues to experience aches and pains which she feels is coming from the Letrozole.  The pain interferes with her walking.  I encouraged her to discuss with Dr. Lindi Adie.  She has also been in the care of a dermatologist who has removed basal cell cancer from her back and chest.  She denied any other needs at this time.  I encouraged her to call me for any assistance.         Patient Visit Type: MedOnc (07/07/15 0900) Treatment Phase: Follow-up (07/07/15 0900) Barriers/Navigation Needs: No barriers at this time (07/07/15 0900)   Interventions: None required (07/07/15 0900)            Acuity: Level 1 (07/07/15 0900) Acuity Level 1: Minimal follow up required (07/07/15 0900)       Time Spent with Patient: 15 (07/07/15 0900)

## 2015-07-07 NOTE — Assessment & Plan Note (Signed)
Left breast invasive lobular cancer ER/PR positive HER-2 negative (initial biopsy showed HER-2 positive but final pathology on the lumpectomy did not) status post left breast lumpectomy and radiation and currently on antiestrogen therapy with Arimidex started 12/01/2012.  Arimidex toxicities: Patient is tolerating Arimidex fairly well except for muscle aches and pains. She is taking meloxicam for pain.   Right humerus fracture: after fall.  Breast cancer surveillance: 1. Breast exam 12/30/2014 is normal 2. Mammogram 12/21/2014 is normal breast density category B 3. Bone density June 2016: T score -1.4  Hypercalcemia: She has had episodic hypercalcemia. PTH level was normal April 2016 (52). Since we stopped calcium supplementation, her calcium levels have come down. She was restarted on calcium supplementation because of osteopenia T score -1.4  Return to clinic in 1 year for follow-up

## 2015-07-07 NOTE — Telephone Encounter (Signed)
appt made and avs printed °

## 2015-08-01 MED FILL — TAMOXIFEN 20 MG TABLET: 20 | 90 days supply | Qty: 90 | Fill #0

## 2015-08-04 DIAGNOSIS — R070 Pain in throat: Secondary | ICD-10-CM | POA: Diagnosis not present

## 2015-08-04 DIAGNOSIS — J029 Acute pharyngitis, unspecified: Secondary | ICD-10-CM | POA: Diagnosis not present

## 2015-08-17 ENCOUNTER — Other Ambulatory Visit: Payer: Self-pay | Admitting: Family Medicine

## 2015-09-08 ENCOUNTER — Ambulatory Visit (INDEPENDENT_AMBULATORY_CARE_PROVIDER_SITE_OTHER): Payer: Medicare Other | Admitting: Family Medicine

## 2015-09-08 ENCOUNTER — Ambulatory Visit (INDEPENDENT_AMBULATORY_CARE_PROVIDER_SITE_OTHER): Payer: Medicare Other

## 2015-09-08 VITALS — BP 136/78 | HR 87 | Temp 98.3°F | Resp 18 | Ht 61.5 in | Wt 218.0 lb

## 2015-09-08 DIAGNOSIS — R000 Tachycardia, unspecified: Secondary | ICD-10-CM

## 2015-09-08 DIAGNOSIS — R05 Cough: Secondary | ICD-10-CM | POA: Diagnosis not present

## 2015-09-08 DIAGNOSIS — G4733 Obstructive sleep apnea (adult) (pediatric): Secondary | ICD-10-CM | POA: Diagnosis not present

## 2015-09-08 DIAGNOSIS — E119 Type 2 diabetes mellitus without complications: Secondary | ICD-10-CM | POA: Diagnosis not present

## 2015-09-08 DIAGNOSIS — F411 Generalized anxiety disorder: Secondary | ICD-10-CM | POA: Diagnosis not present

## 2015-09-08 DIAGNOSIS — E785 Hyperlipidemia, unspecified: Secondary | ICD-10-CM | POA: Diagnosis not present

## 2015-09-08 DIAGNOSIS — C50112 Malignant neoplasm of central portion of left female breast: Secondary | ICD-10-CM | POA: Diagnosis not present

## 2015-09-08 DIAGNOSIS — I10 Essential (primary) hypertension: Secondary | ICD-10-CM

## 2015-09-08 DIAGNOSIS — F410 Panic disorder [episodic paroxysmal anxiety] without agoraphobia: Secondary | ICD-10-CM | POA: Diagnosis not present

## 2015-09-08 DIAGNOSIS — J301 Allergic rhinitis due to pollen: Secondary | ICD-10-CM

## 2015-09-08 DIAGNOSIS — Z9989 Dependence on other enabling machines and devices: Secondary | ICD-10-CM

## 2015-09-08 LAB — POC MICROSCOPIC URINALYSIS (UMFC): MUCUS RE: ABSENT

## 2015-09-08 LAB — LIPID PANEL
CHOL/HDL RATIO: 4 ratio (ref <=5.0)
CHOLESTEROL: 161 mg/dL (ref 125–200)
HDL: 40 mg/dL — AB (ref >=46)
LDL Cholesterol: 90 mg/dL (ref <130)
Triglycerides: 153 mg/dL — ABNORMAL HIGH (ref <150)
VLDL: 31 mg/dL — AB (ref <30)

## 2015-09-08 LAB — COMPREHENSIVE METABOLIC PANEL
ALT: 18 U/L (ref 6–29)
AST: 22 U/L (ref 10–35)
Albumin: 4.3 g/dL (ref 3.6–5.1)
Alkaline Phosphatase: 71 U/L (ref 33–130)
BUN: 14 mg/dL (ref 7–25)
CHLORIDE: 97 mmol/L — AB (ref 98–110)
CO2: 27 mmol/L (ref 20–31)
Calcium: 9.6 mg/dL (ref 8.6–10.4)
Creat: 0.75 mg/dL (ref 0.60–0.93)
Glucose, Bld: 119 mg/dL — ABNORMAL HIGH (ref 65–99)
POTASSIUM: 4 mmol/L (ref 3.5–5.3)
Sodium: 134 mmol/L — ABNORMAL LOW (ref 135–146)
Total Bilirubin: 0.7 mg/dL (ref 0.2–1.2)
Total Protein: 7.2 g/dL (ref 6.1–8.1)

## 2015-09-08 LAB — POCT URINALYSIS DIP (MANUAL ENTRY)
Bilirubin, UA: NEGATIVE
Blood, UA: NEGATIVE
GLUCOSE UA: NEGATIVE
Ketones, POC UA: NEGATIVE
LEUKOCYTES UA: NEGATIVE
Nitrite, UA: NEGATIVE
Protein Ur, POC: NEGATIVE
Spec Grav, UA: 1.015
UROBILINOGEN UA: 0.2
pH, UA: 5.5

## 2015-09-08 LAB — POCT GLYCOSYLATED HEMOGLOBIN (HGB A1C): Hemoglobin A1C: 7.9

## 2015-09-08 LAB — POCT CBC
Granulocyte percent: 67.5 %G (ref 37–80)
HEMATOCRIT: 37.6 % — AB (ref 37.7–47.9)
Hemoglobin: 12.9 g/dL (ref 12.2–16.2)
LYMPH, POC: 2 (ref 0.6–3.4)
MCH, POC: 29.5 pg (ref 27–31.2)
MCHC: 34.2 g/dL (ref 31.8–35.4)
MCV: 86.1 fL (ref 80–97)
MID (CBC): 0.5 (ref 0–0.9)
MPV: 7.3 fL (ref 0–99.8)
POC GRANULOCYTE: 5.2 (ref 2–6.9)
POC LYMPH %: 26.4 % (ref 10–50)
POC MID %: 6.1 % (ref 0–12)
Platelet Count, POC: 222 10*3/uL (ref 142–424)
RBC: 4.37 M/uL (ref 4.04–5.48)
RDW, POC: 13 %
WBC: 7.7 10*3/uL (ref 4.6–10.2)

## 2015-09-08 LAB — TSH: TSH: 1.54 m[IU]/L

## 2015-09-08 LAB — GLUCOSE, POCT (MANUAL RESULT ENTRY): POC Glucose: 137 mg/dl — AB (ref 70–99)

## 2015-09-08 MED ORDER — METFORMIN HCL 500 MG PO TABS: 1000.0000 mg | ORAL_TABLET | Freq: Two times a day (BID) | ORAL | Status: DC

## 2015-09-08 MED ORDER — AZELASTINE HCL 0.1 % NA SOLN: 2.0000 | Freq: Two times a day (BID) | NASAL | Status: DC

## 2015-09-08 NOTE — Progress Notes (Signed)
Subjective:    Patient ID: [name], female    DOB: February 09, 1946, 70 y.o.   MRN: [id number]  09/08/2015  Hypertension and Tachycardia   HPI This 70 y.o. female presents for evaluation of palpitations.  Onset one week ago.  Called EMS due to erratic heart rhythm.  Also, unsteady and does not feel right.  Got irritated and bent over, everything went haywire.  Had been going full steam and then hit a wall.  116/64 and 111 at that time. Went to church; returned home; started doing things again; rechecked BP 101/60 and 107.  Had slowed down at meeting.  Called helpline nurse; recommended calling 911.  Got emotional, pulse increased.  Called daughter; son-in-law checked out; daughter called 70.  BP increased due to stress.  Did not want to go to ED.  BP 180/90; pulse 128.  Oxygen 98%.  EKG tracing from EMT 138. Taking blood pressure medication; BP 134/68-100/50.  Average is 115/60.   Switched to Tamoxifen.    Taking Metformin 500mg  bid; could not tolerate Metformin ER; caused diarrhea; as no diarrhea with Metformin. Sugar 213 when EMS at home.  Also does have a bad tooth; does not have the money for dental procedure.  Then had anxiety attack while driving last week. Has a lot going on.  Friends coming over; major stressors.  Trying to be calm.    Cough: has PND constantly; switched to Cetirizine.  Taking Flonase every evening.    Review of Systems  Constitutional: Negative for fever, chills, diaphoresis and fatigue.  HENT: Positive for postnasal drip and rhinorrhea. Negative for congestion, sinus pressure, sneezing, sore throat, trouble swallowing and voice change.   Eyes: Negative for visual disturbance.  Respiratory: Positive for cough. Negative for shortness of breath and wheezing.   Cardiovascular: Positive for palpitations. Negative for chest pain and leg swelling.  Gastrointestinal: Negative for nausea, vomiting, abdominal pain, diarrhea and constipation.  Endocrine: Negative for  cold intolerance, heat intolerance, polydipsia, polyphagia and polyuria.  Neurological: Negative for dizziness, tremors, seizures, syncope, facial asymmetry, speech difficulty, weakness, light-headedness, numbness and headaches.  Psychiatric/Behavioral: Negative for suicidal ideas, sleep disturbance, self-injury and dysphoric mood. The patient is nervous/anxious.     Past Medical History  Diagnosis Date  . Postmenopausal bleeding   . Fibroids   . Headache(784.0)     tension  . Seasonal allergies   . Anemia     due to vaginal bleeding  . Urinary urgency   . Hypertension     under control with med., has been on med. x 3 yr.  . White coat hypertension   . History of endometriosis   . Breast cancer ([place]) 08/2012    left  . Sleep apnea     uses CPAP nightly  . Arthritis     hands  . Dental crowns present   . Hyperlipidemia   . OSA (obstructive sleep apnea)   . S/P radiation therapy 10/20/2012-12/05/2012    left breast 50.4 gray, lumpectomy cavity boosted to 62.4 gray  . Allergy    Past Surgical History  Procedure Laterality Date  . Hysteroscopy w/d&c  05/06/2009  . Exploratory laparotomy  06-30-2007    ATTEMPTED HYSTERECTOMY ABORTED DUE TO EXTENSIVE ENDOMETRIOSIS W/ DENSE PELVIC ADHESIONS INVOLVING UTERUS AND LOWER RECTOSIGMOID  . Cholecystectomy  1995  . Colonoscopy w/ polypectomy  03/20/2007  . Dilation and curettage of uterus  2005  . Dilation and curettage of uterus  06/22/2011    Procedure: DILATATION AND  CURETTAGE;  Surgeon: Alvino Chapel, MD;  Location: [facility];  Service: Gynecology;  Laterality: N/A;  OK PER KEELA FOR 7:15 START  . Breast lumpectomy with needle localization and axillary sentinel lymph node bx Left 09/03/2012    Procedure: BREAST LUMPECTOMY WITH NEEDLE LOCALIZATION AND AXILLARY SENTINEL LYMPH NODE BX;  Surgeon: Edward Jolly, MD;  Location: [facility];  Service: General;  Laterality: Left;  . Back surgery  1997     lumbar  . Dorsal compartment release Right 05/19/2013    Procedure: RELEASE 1ST  DORSAL COMPARTMENT RIGHT (DEQUERVAIN);  Surgeon: Cammie Sickle., MD;  Location: [facility];  Service: Orthopedics;  Laterality: Right;   Allergies  Allergen Reactions  . Latex Itching and Other (See Comments)    Causes blisters  . Adhesive [Tape] Rash    Social History   Social History  . Marital Status: Married    Spouse Name: [name]  . Number of Children: 4  . Years of Education: College   Occupational History  . retired    Social History Main Topics  . Smoking status: Never Smoker   . Smokeless tobacco: Never Used  . Alcohol Use: No  . Drug Use: No  . Sexual Activity: Yes     Comment: menarche 34, P4, Premarin for short while, Provera x 3 years   Other Topics Concern  . Not on file   Social History Narrative   Marital status: married x 96  years; happily married; no abuse.      Children: 4 children; six grandchildren; no gg.      Lives:  With husband.      Employed: retired in 2011; [employer].      Tobacco: none       Alcohol:  None      Drugs: none      Exercise:  Daily. Walking x 1 hour three days per week.      Seatbelt:  100%      Sunscreen:  Face SPF 15.      Guns:  Loaded mostly secured.      ADLs:  Drives; no assistant devices; independent with all ADLs.       Her nocturia is improved under CPAP use at 10 cm water- AHI is now 0.8 and from 30 at baseline. CMS compliance  6 hours 40 minutes - sleeps on the side, 08-10-11 .FFM - likes it.    . Flonase used prn.     Caffeine Use: 1 cup daily   Family History  Problem Relation Age of Onset  . Hypertension Other   . Vision loss Mother   . Hypertension Mother   . Anesthesia problems Mother     post-op N/V  . Heart disease Mother   . Osteoporosis Mother   . Depression Father   . Heart disease Sister   . Hypertension Sister   . Hyperlipidemia Sister   . Diabetes Brother   .  Hypertension Brother   . Cancer Brother     skin  . Hyperlipidemia Brother   . COPD Daughter   . Cancer Cousin   . Kidney failure Maternal Grandmother   . Heart disease Maternal Grandfather   . Colon cancer Neg Hx        Objective:    BP 136/78 mmHg  Pulse 87  Temp(Src) 98.3 F (36.8 C)  Resp 18  Ht 5' 1.5" (1.562 m)  Wt 218 lb [phone]  kg)  BMI 40.53 kg/m2  SpO2 97% Physical Exam  Constitutional: She is oriented to person, place, and time. She appears well-developed and well-nourished. No distress.  HENT:  Head: Normocephalic and atraumatic.  Right Ear: External ear normal.  Left Ear: External ear normal.  Nose: Nose normal.  Mouth/Throat: Oropharynx is clear and moist.  Eyes: Conjunctivae and EOM are normal. Pupils are equal, round, and reactive to light.  Neck: Normal range of motion. Neck supple. Carotid bruit is not present. No thyromegaly present.  Cardiovascular: Normal rate, regular rhythm, normal heart sounds and intact distal pulses.  Exam reveals no gallop and no friction rub.   No murmur heard. Pulmonary/Chest: Effort normal and breath sounds normal. She has no wheezes. She has no rales.  Abdominal: Soft. Bowel sounds are normal. She exhibits no distension and no mass. There is no tenderness. There is no rebound and no guarding.  Lymphadenopathy:    She has no cervical adenopathy.  Neurological: She is alert and oriented to person, place, and time. No cranial nerve deficit. She exhibits normal muscle tone. Coordination normal.  Skin: Skin is warm and dry. No rash noted. She is not diaphoretic. No erythema. No pallor.  Psychiatric: She has a normal mood and affect. Her behavior is normal. Judgment and thought content normal.    EKG: NSR; rate 86.    No results found.  Assessment & Plan:   1. Tachycardia   2. Panic attack   3. Generalized anxiety disorder   4. Hyperlipidemia   5. Essential hypertension   6. Obstructive sleep apnea on CPAP   7. Type 2  diabetes mellitus without complication, without long-term current use of insulin ([place])   8. Cancer of central portion of left female breast ([place])   9. Allergic rhinitis due to pollen     Orders Placed This Encounter  Procedures  . DG Chest 2 View    Standing Status: Future     Number of Occurrences: 1     Standing Expiration Date: 09/07/2016    Order Specific Question:  Reason for Exam (SYMPTOM  OR DIAGNOSIS REQUIRED)    Answer:  coughing for five months since the flu    Order Specific Question:  Preferred imaging location?    Answer:  External  . Comprehensive metabolic panel    Order Specific Question:  Has the patient fasted?    Answer:  Yes  . Lipid panel    Order Specific Question:  Has the patient fasted?    Answer:  Yes  . TSH  . Ambulatory referral to Cardiology    Referral Priority:  Routine    Referral Type:  Consultation    Referral Reason:  Specialty Services Required    Requested Specialty:  Cardiology    Number of Visits Requested:  1  . POCT CBC  . POCT glucose (manual entry)  . POCT glycosylated hemoglobin (Hb A1C)  . POCT urinalysis dipstick  . POCT Microscopic Urinalysis ([facility])  . EKG 12-Lead   Meds ordered this encounter  Medications  . metFORMIN (GLUCOPHAGE) 500 MG tablet    Sig: Take 2 tablets (1,000 mg total) by mouth 2 (two) times daily with a meal.    Dispense:  360 tablet    Refill:  3  . azelastine (ASTELIN) 0.1 % nasal spray    Sig: Place 2 sprays into both nostrils 2 (two) times daily. Use in each nostril as directed    Dispense:  30 mL    Refill:  1    Return in about 4 months (around 01/08/2016) for recheck.   Samona Chihuahua Elayne Guerin, M.D. [facility] [address] [place], [place]  [address] [phone] phone [phone] fax

## 2015-09-08 NOTE — Patient Instructions (Addendum)
IF you received an x-ray today, you will receive an invoice from [facility]. Please contact [facility] at [phone] with questions or concerns regarding your invoice.   IF you received labwork today, you will receive an invoice from [employer]. Please contact [facility] at [phone] with questions or concerns regarding your invoice.   Our billing staff will not be able to assist you with questions regarding bills from these companies.  You will be contacted with the lab results as soon as they are available. The fastest way to get your results is to activate your My Chart account. Instructions are located on the last page of this paperwork. If you have not heard from [place] regarding the results in 2 weeks, please contact this office.     Generalized Anxiety Disorder Generalized anxiety disorder (GAD) is a mental disorder. It interferes with life functions, including relationships, work, and school. GAD is different from normal anxiety, which everyone experiences at some point in their lives in response to specific life events and activities. Normal anxiety actually helps [place] prepare for and get through these life events and activities. Normal anxiety goes away after the event or activity is over.  GAD causes anxiety that is not necessarily related to specific events or activities. It also causes excess anxiety in proportion to specific events or activities. The anxiety associated with GAD is also difficult to control. GAD can vary from mild to severe. People with severe GAD can have intense waves of anxiety with physical symptoms (panic attacks).  SYMPTOMS The anxiety and worry associated with GAD are difficult to control. This anxiety and worry are related to many life events and activities and also occur more days than not for 6 months or longer. People with GAD also have three or more of the following symptoms (one or more in  children):  Restlessness.   Fatigue.  Difficulty concentrating.   Irritability.  Muscle tension.  Difficulty sleeping or unsatisfying sleep. DIAGNOSIS GAD is diagnosed through an assessment by your health care provider. Your health care provider will ask you questions aboutyour mood,physical symptoms, and events in your life. Your health care provider may ask you about your medical history and use of alcohol or drugs, including prescription medicines. Your health care provider may also do a physical exam and blood tests. Certain medical conditions and the use of certain substances can cause symptoms similar to those associated with GAD. Your health care provider may refer you to a mental health specialist for further evaluation. TREATMENT The following therapies are usually used to treat GAD:   Medication. Antidepressant medication usually is prescribed for long-term daily control. Antianxiety medicines may be added in severe cases, especially when panic attacks occur.   Talk therapy (psychotherapy). Certain types of talk therapy can be helpful in treating GAD by providing support, education, and guidance. A form of talk therapy called cognitive behavioral therapy can teach you healthy ways to think about and react to daily life events and activities.  Stress managementtechniques. These include yoga, meditation, and exercise and can be very helpful when they are practiced regularly. A mental health specialist can help determine which treatment is best for you. Some people see improvement with one therapy. However, other people require a combination of therapies.   This information is not intended to replace advice given to you by your health care provider. Make sure you discuss any questions you have with your health care provider.   Document Released: 06/30/2012  Document Revised: 03/26/2014 Document Reviewed: 06/30/2012 Elsevier Interactive Patient Education [employer].

## 2015-09-12 ENCOUNTER — Other Ambulatory Visit: Payer: Self-pay | Admitting: Family Medicine

## 2015-09-14 ENCOUNTER — Telehealth: Payer: Self-pay

## 2015-09-14 NOTE — Telephone Encounter (Signed)
ICD 10 - code faxed to pharmacy IC pt to advise

## 2015-09-14 NOTE — Telephone Encounter (Signed)
PATIENT STATES DR. Tamala Julian IS HER PCP AND SHE REQUESTED TO GET HER 1 TOUCH LANCETS AND 1 TOUCH ULTRA TEST STRIPS. HER PHARMACY TOLD HER THAT THE INSURANCE WILL NOT PAY FOR THEM BECAUSE THEY DID NOT HAVE A DIAGNOSIS CODE. SHE SAID THE PHARMACY WAS GOING TO FAX OVER THE PAPER WORK THIS MORNING TO [place] THAT THEY NEED SO THAT IT CAN BE REPROCESSED AS SOON AS POSSIBLE. PATIENT STATES SHE IS GOING OUT OF TOWN Thursday.  BEST PHONE [phone] (HOME)  Marmarth ELMSLEY    MBC

## 2015-09-15 ENCOUNTER — Other Ambulatory Visit: Payer: Self-pay

## 2015-09-15 MED ORDER — GLUCOSE BLOOD VI STRP: ORAL_STRIP | Status: DC

## 2015-09-15 MED ORDER — ONETOUCH DELICA LANCETS 33G MISC: Status: DC

## 2015-09-15 NOTE — Telephone Encounter (Signed)
new rx entered to reflect refills and ICD 10 code

## 2015-09-27 DIAGNOSIS — R002 Palpitations: Secondary | ICD-10-CM | POA: Diagnosis not present

## 2015-09-27 DIAGNOSIS — E7800 Pure hypercholesterolemia, unspecified: Secondary | ICD-10-CM | POA: Diagnosis not present

## 2015-09-27 DIAGNOSIS — I10 Essential (primary) hypertension: Secondary | ICD-10-CM | POA: Diagnosis not present

## 2015-09-27 DIAGNOSIS — I499 Cardiac arrhythmia, unspecified: Secondary | ICD-10-CM | POA: Diagnosis not present

## 2015-10-04 ENCOUNTER — Ambulatory Visit: Payer: Medicare Other | Admitting: Family Medicine

## 2015-10-08 ENCOUNTER — Emergency Department (HOSPITAL_COMMUNITY)
Admission: EM | Admit: 2015-10-08 | Discharge: 2015-10-08 | Disposition: A | Discharge status: Home / Self Care | Payer: Medicare Other | Attending: Emergency Medicine | Admitting: Emergency Medicine

## 2015-10-08 ENCOUNTER — Encounter (HOSPITAL_COMMUNITY): Payer: Self-pay | Admitting: Emergency Medicine

## 2015-10-08 ENCOUNTER — Emergency Department (HOSPITAL_BASED_OUTPATIENT_CLINIC_OR_DEPARTMENT_OTHER)
Admission: RE | Admit: 2015-10-08 | Discharge: 2015-10-08 | Disposition: A | Discharge status: Home / Self Care | Payer: Medicare Other | Source: Ambulatory Visit | Attending: Emergency Medicine | Admitting: Emergency Medicine

## 2015-10-08 DIAGNOSIS — Z7982 Long term (current) use of aspirin: Secondary | ICD-10-CM | POA: Diagnosis not present

## 2015-10-08 DIAGNOSIS — Z7984 Long term (current) use of oral hypoglycemic drugs: Secondary | ICD-10-CM | POA: Insufficient documentation

## 2015-10-08 DIAGNOSIS — Z7951 Long term (current) use of inhaled steroids: Secondary | ICD-10-CM | POA: Insufficient documentation

## 2015-10-08 DIAGNOSIS — M79609 Pain in unspecified limb: Secondary | ICD-10-CM

## 2015-10-08 DIAGNOSIS — M1990 Unspecified osteoarthritis, unspecified site: Secondary | ICD-10-CM | POA: Insufficient documentation

## 2015-10-08 DIAGNOSIS — I10 Essential (primary) hypertension: Secondary | ICD-10-CM | POA: Insufficient documentation

## 2015-10-08 DIAGNOSIS — M79606 Pain in leg, unspecified: Secondary | ICD-10-CM

## 2015-10-08 DIAGNOSIS — Z79899 Other long term (current) drug therapy: Secondary | ICD-10-CM | POA: Insufficient documentation

## 2015-10-08 DIAGNOSIS — M79604 Pain in right leg: Secondary | ICD-10-CM | POA: Diagnosis not present

## 2015-10-08 DIAGNOSIS — M79669 Pain in unspecified lower leg: Secondary | ICD-10-CM | POA: Diagnosis present

## 2015-10-08 DIAGNOSIS — E785 Hyperlipidemia, unspecified: Secondary | ICD-10-CM | POA: Insufficient documentation

## 2015-10-08 DIAGNOSIS — M79605 Pain in left leg: Secondary | ICD-10-CM | POA: Diagnosis not present

## 2015-10-08 DIAGNOSIS — Z853 Personal history of malignant neoplasm of breast: Secondary | ICD-10-CM | POA: Insufficient documentation

## 2015-10-08 LAB — COMPREHENSIVE METABOLIC PANEL
ALBUMIN: 4.2 g/dL (ref 3.5–5.0)
ALT: 20 U/L (ref 14–54)
AST: 27 U/L (ref 15–41)
Alkaline Phosphatase: 73 U/L (ref 38–126)
Anion gap: 9 (ref 5–15)
BILIRUBIN TOTAL: 0.9 mg/dL (ref 0.3–1.2)
BUN: 13 mg/dL (ref 6–20)
CHLORIDE: 100 mmol/L — AB (ref 101–111)
CO2: 28 mmol/L (ref 22–32)
CREATININE: 0.76 mg/dL (ref 0.44–1.00)
Calcium: 10.5 mg/dL — ABNORMAL HIGH (ref 8.9–10.3)
GFR calc Af Amer: >60 mL/min (ref >60)
GLUCOSE: 143 mg/dL — AB (ref 65–99)
POTASSIUM: 3.8 mmol/L (ref 3.5–5.1)
Sodium: 137 mmol/L (ref 135–145)
TOTAL PROTEIN: 7.7 g/dL (ref 6.5–8.1)

## 2015-10-08 LAB — CBC WITH DIFFERENTIAL/PLATELET
Basophils Absolute: 0 10*3/uL (ref 0.0–0.1)
Basophils Relative: 0 %
EOS ABS: 0.2 10*3/uL (ref 0.0–0.7)
EOS PCT: 2 %
HCT: 39.9 % (ref 36.0–46.0)
Hemoglobin: 13.2 g/dL (ref 12.0–15.0)
LYMPHS ABS: 1.5 10*3/uL (ref 0.7–4.0)
LYMPHS PCT: 21 %
MCH: 29.5 pg (ref 26.0–34.0)
MCHC: 33.1 g/dL (ref 30.0–36.0)
MCV: 89.1 fL (ref 78.0–100.0)
MONO ABS: 0.4 10*3/uL (ref 0.1–1.0)
Monocytes Relative: 5 %
Neutro Abs: 5.1 10*3/uL (ref 1.7–7.7)
Neutrophils Relative %: 72 %
PLATELETS: 270 10*3/uL (ref 150–400)
RBC: 4.48 MIL/uL (ref 3.87–5.11)
RDW: 12.7 % (ref 11.5–15.5)
WBC: 7.2 10*3/uL (ref 4.0–10.5)

## 2015-10-08 NOTE — ED Provider Notes (Signed)
CSN: [id number]     Arrival date & time 10/08/15  0913 History   First MD Initiated Contact with Patient 10/08/15 [phone]     No chief complaint on file.    (Consider location/radiation/quality/duration/timing/severity/associated sxs/prior Treatment) HPI   [name] is a 70 y.o. female here for evaluation of bilateral lower leg pain and swelling. Symptoms present for several months since starting on tamoxifen. More discomfort with left than right leg. The discomfort is described as a intermittent cramping sensation. No change in ability to breathe. No chest pain. Occasional cough productive of sputum. She denies fever, chills, nausea, vomiting, weakness or dizziness. She is being evaluated by cardiology with heart monitoring, prolonged, for episodes of palpitations. She denies syncope. There are no other known modifying factors.   Past Medical History  Diagnosis Date  . Postmenopausal bleeding   . Fibroids   . Headache(784.0)     tension  . Seasonal allergies   . Anemia     due to vaginal bleeding  . Urinary urgency   . Hypertension     under control with med., has been on med. x 3 yr.  . White coat hypertension   . History of endometriosis   . Breast cancer ([place]) 08/2012    left  . Sleep apnea     uses CPAP nightly  . Arthritis     hands  . Dental crowns present   . Hyperlipidemia   . OSA (obstructive sleep apnea)   . S/P radiation therapy 10/20/2012-12/05/2012    left breast 50.4 gray, lumpectomy cavity boosted to 62.4 gray  . Allergy    Past Surgical History  Procedure Laterality Date  . Hysteroscopy w/d&c  05/06/2009  . Exploratory laparotomy  06-30-2007    ATTEMPTED HYSTERECTOMY ABORTED DUE TO EXTENSIVE ENDOMETRIOSIS W/ DENSE PELVIC ADHESIONS INVOLVING UTERUS AND LOWER RECTOSIGMOID  . Cholecystectomy  1995  . Colonoscopy w/ polypectomy  03/20/2007  . Dilation and curettage of uterus  2005  . Dilation and curettage of uterus  06/22/2011    Procedure: DILATATION AND  CURETTAGE;  Surgeon: Alvino Chapel, MD;  [facility];  Service: Gynecology;  Laterality: N/A;  OK PER KEELA FOR 7:15 START  . Breast lumpectomy with needle localization and axillary sentinel lymph node bx Left 09/03/2012    Procedure: BREAST LUMPECTOMY WITH NEEDLE LOCALIZATION AND AXILLARY SENTINEL LYMPH NODE BX;  Surgeon: Edward Jolly, MD;  Location: [facility];  Service: General;  Laterality: Left;  . Back surgery  1997    lumbar  . Dorsal compartment release Right 05/19/2013    Procedure: RELEASE 1ST  DORSAL COMPARTMENT RIGHT (DEQUERVAIN);  Surgeon: Cammie Sickle., MD;  [facility]: [facility];  Service: Orthopedics;  Laterality: Right;   Family History  Problem Relation Age of Onset  . Hypertension Other   . Vision loss Mother   . Hypertension Mother   . Anesthesia problems Mother     post-op N/V  . Heart disease Mother   . Osteoporosis Mother   . Depression Father   . Heart disease Sister   . Hypertension Sister   . Hyperlipidemia Sister   . Diabetes Brother   . Hypertension Brother   . Cancer Brother     skin  . Hyperlipidemia Brother   . COPD Daughter   . Cancer Cousin   . Kidney failure Maternal Grandmother   . Heart disease Maternal Grandfather   . Colon cancer Neg Hx  Social History  Substance Use Topics  . Smoking status: Never Smoker   . Smokeless tobacco: Never Used  . Alcohol Use: No   OB History    No data available     Review of Systems  All other systems reviewed and are negative.     Allergies  Adhesive and Latex  Home Medications   Prior to Admission medications   Medication Sig Start Date End Date Taking? Authorizing Provider  albuterol (PROVENTIL HFA;VENTOLIN HFA) 108 (90 Base) MCG/ACT inhaler Inhale 2 puffs into the lungs every 6 (six) hours as needed for wheezing or shortness of breath.   Yes Historical Provider, MD  aspirin 81 MG tablet Take 81 mg by mouth daily.    Yes Historical Provider, MD  azelastine (ASTELIN) 0.1 % nasal spray Place 2 sprays into both nostrils 2 (two) times daily. Use in each nostril as directed Patient taking differently: Place 1 spray into both nostrils 2 (two) times daily as needed for allergies. Use in each nostril as directed 09/08/15  Yes [name], MD  blood glucose meter kit and supplies KIT Test blood sugar once daily. Dx code: E11.9 06/23/14  Yes [name], MD  calcium-vitamin D (OSCAL WITH D) 500-200 MG-UNIT tablet Take 1 tablet by mouth 2 (two) times daily.   Yes Historical Provider, MD  Cyanocobalamin (VITAMIN B-12 CR PO) Take 500 mcg by mouth daily.    Yes Historical Provider, MD  glucose blood (ONE TOUCH ULTRA TEST) test strip USE ONE STRIP TO CHECK GLUCOSE ONCE DAILY 09/15/15  Yes [name], MD  lisinopril-hydrochlorothiazide (PRINZIDE,ZESTORETIC) 10-12.5 MG tablet TAKE ONE TABLET BY MOUTH ONCE DAILY 08/18/15  Yes [name], MD  lovastatin (MEVACOR) 40 MG tablet Take 1 tablet (40 mg total) by mouth at bedtime. 05/31/15  Yes [name], MD  metFORMIN (GLUCOPHAGE) 500 MG tablet Take 2 tablets (1,000 mg total) by mouth 2 (two) times daily with a meal. Patient taking differently: Take 500 mg by mouth 3 (three) times daily.  09/08/15  Yes [name], MD  Multiple Vitamins-Minerals (OCUVITE PO) Take 1 capsule by mouth daily.   Yes Historical Provider, MD  Omega-3 Fatty Acids (FISH OIL) 1000 MG CAPS Take 1 capsule by mouth daily.   Yes Historical Provider, MD  [facility] DELICA LANCETS 86L MISC USE ONE  TO CHECK GLUCOSE ONCE DAILY 09/15/15  Yes [name], MD  tamoxifen (NOLVADEX) 20 MG tablet Take 1 tablet (20 mg total) by mouth daily. 07/07/15  Yes Nicholas Lose, MD  Turmeric 500 MG CAPS Take 500 mg by mouth daily.   Yes Historical Provider, MD  vitamin C (ASCORBIC ACID) 500 MG tablet Take 500 mg by mouth daily.    Yes Historical Provider, MD  Zinc 50 MG TABS Take 1 tablet by mouth daily.   Yes Historical  Provider, MD  fluticasone (FLONASE) 50 MCG/ACT nasal spray Place 2 sprays into both nostrils daily. As needed Patient taking differently: Place 2 sprays into both nostrils daily as needed for allergies. As needed 03/23/13   [name], MD   BP 147/73 mmHg  Pulse 77  Temp(Src) 98.2 F (36.8 C) (Oral)  Resp 18  SpO2 99% Physical Exam  Constitutional: She is oriented to person, place, and time. She appears well-developed and well-nourished.  HENT:  Head: Normocephalic and atraumatic.  Right Ear: External ear normal.  Left Ear: External ear normal.  Eyes: Conjunctivae and EOM are normal. Pupils are equal, round,  and reactive to light.  Neck: Normal range of motion and phonation normal. Neck supple.  Cardiovascular: Normal rate, regular rhythm and normal heart sounds.   Pulmonary/Chest: Effort normal and breath sounds normal. She exhibits no bony tenderness.  Abdominal: Soft. There is no tenderness.  Musculoskeletal: Normal range of motion.  Mild lower leg edema bilaterally, left greater than right, with some mild calf tenderness on the left with palpation.  Neurological: She is alert and oriented to person, place, and time. No cranial nerve deficit or sensory deficit. She exhibits normal muscle tone. Coordination normal.  Skin: Skin is warm, dry and intact.  Psychiatric: She has a normal mood and affect. Her behavior is normal. Judgment and thought content normal.  Nursing note and vitals reviewed.   ED Course  Procedures (including critical care time)  Medications - No data to display  Patient Vitals for the past 24 hrs:  BP Temp Temp src Pulse Resp SpO2  10/08/15 1302 147/73 mmHg - - 77 18 99 %  10/08/15 0932 - - - - - 99 %  10/08/15 0927 147/73 mmHg 98.2 F (36.8 C) Oral 85 19 99 %    1:06 PM Reevaluation with update and discussion. After initial assessment and treatment, an updated evaluation reveals No additional complaints. Findings discussed with patient and all  questions answered. Quiera Diffee L    Labs Review Labs Reviewed  COMPREHENSIVE METABOLIC PANEL - Abnormal; Notable for the following:    Chloride 100 (*)    Glucose, Bld 143 (*)    Calcium 10.5 (*)    All other components within normal limits  CBC WITH DIFFERENTIAL/PLATELET    Imaging Review No results found. I have personally reviewed and evaluated these images and lab results as part of my medical decision-making.   EKG Interpretation None      MDM   Final diagnoses:  Pain of lower extremity, unspecified laterality    Nonspecific leg pain, without worrisome findings on evaluation. Doubt DVT, PE, metabolic instability or serious bacterial infection.  Nursing Notes Reviewed/ Care Coordinated Applicable Imaging Reviewed Interpretation of Laboratory Data incorporated into ED treatment  The patient appears reasonably screened and/or stabilized for discharge and I doubt any other medical condition or other [facility] requiring further screening, evaluation, or treatment in the ED at this time prior to discharge.  Plan: Home Medications- APAP for pain; Home Treatments- leg elevation; return here if the recommended treatment, does not improve the symptoms; Recommended follow up- PCP prn     Daleen Bo, MD 10/08/15 [phone]

## 2015-10-08 NOTE — ED Notes (Signed)
Per patient, she is taking Tamoxifen and states she has cramps in both leg (left is worse).  Started medication in early May, cramps started within the first month of taking medication.  Recently, cramps are more frequently and worse.

## 2015-10-08 NOTE — Discharge Instructions (Signed)
Try leg elevation and heat to help the discomfort. You can also use Tylenol every 4 hours as needed for pain.   Pain Without a Known Cause WHAT IS PAIN WITHOUT A KNOWN CAUSE? Pain can occur in any part of the body and can range from mild to severe. Sometimes no cause can be found for why you are having pain. Some types of pain that can occur without a known cause include:   Headache.  Back pain.  Abdominal pain.  Neck pain. HOW IS PAIN WITHOUT A KNOWN CAUSE DIAGNOSED?  Your health care provider will try to find the cause of your pain. This may include:  Physical exam.  Medical history.  Blood tests.  Urine tests.  X-rays. If no cause is found, your health care provider may diagnose you with pain without a known cause.  IS THERE TREATMENT FOR PAIN WITHOUT A CAUSE?  Treatment depends on the kind of pain you have. Your health care provider may prescribe medicines to help relieve your pain.  WHAT CAN I DO AT HOME FOR MY PAIN?   Take medicines only as directed by your health care provider.  Stop any activities that cause pain. During periods of severe pain, bed rest may help.  Try to reduce your stress with activities such as yoga or meditation. Talk to your health care provider for other stress-reducing activity recommendations.  Exercise regularly, if approved by your health care provider.  Eat a healthy diet that includes fruits and vegetables. This may improve pain. Talk to your health care provider if you have any questions about your diet. WHAT IF MY PAIN DOES NOT GET BETTER?  If you have a painful condition and no reason can be found for the pain or the pain gets worse, it is important to follow up with your health care provider. It may be necessary to repeat tests and look further for a possible cause.    This information is not intended to replace advice given to you by your health care provider. Make sure you discuss any questions you have with your health care  provider.   Document Released: 11/28/2000 Document Revised: 03/26/2014 Document Reviewed: 07/21/2013 Elsevier Interactive Patient Education 2016 [place]ary therapy can help ease sore, stiff, injured, and tight muscles and joints. Heat relaxes your muscles, which may help ease your pain.  RISKS AND COMPLICATIONS If you have any of the following conditions, do not use heat therapy unless your health care provider has approved:  Poor circulation.  Healing wounds or scarred skin in the area being treated.  Diabetes, heart disease, or high blood pressure.  Not being able to feel (numbness) the area being treated.  Unusual swelling of the area being treated.  Active infections.  Blood clots.  Cancer.  Inability to communicate pain. This may include young children and people who have problems with their brain function (dementia).  Pregnancy. Heat therapy should only be used on old, pre-existing, or long-lasting (chronic) injuries. Do not use heat therapy on new injuries unless directed by your health care provider. HOW TO USE HEAT THERAPY There are several different kinds of heat therapy, including:  Moist heat pack.  Warm water bath.  Hot water bottle.  Electric heating pad.  Heated gel pack.  Heated wrap.  Electric heating pad. Use the heat therapy method suggested by your health care provider. Follow your health care provider's instructions on when and how to use heat therapy. GENERAL HEAT THERAPY RECOMMENDATIONS  Do not sleep while using heat therapy. Only use heat therapy while you are awake.  Your skin may turn pink while using heat therapy. Do not use heat therapy if your skin turns red.  Do not use heat therapy if you have new pain.  High heat or long exposure to heat can cause burns. Be careful when using heat therapy to avoid burning your skin.  Do not use heat therapy on areas of your skin that are already irritated, such as with a  rash or sunburn. SEEK MEDICAL CARE IF:  You have blisters, redness, swelling, or numbness.  You have new pain.  Your pain is worse. MAKE SURE YOU:  Understand these instructions.  Will watch your condition.  Will get help right away if you are not doing well or get worse.   This information is not intended to replace advice given to you by your health care provider. Make sure you discuss any questions you have with your health care provider.   Document Released: 05/28/2011 Document Revised: 03/26/2014 Document Reviewed: 04/28/2013 Elsevier Interactive Patient Education [employer].

## 2015-10-08 NOTE — Progress Notes (Signed)
VASCULAR LAB PRELIMINARY  PRELIMINARY  PRELIMINARY  PRELIMINARY  Bilateral lower extremity venous duplex completed.    Preliminary report:  There is no DVT or SVT noted in the bilateral lower extremities.   Annalissa Murphey, RVT 10/08/2015, 11:50 AM

## 2015-10-10 ENCOUNTER — Telehealth: Payer: Self-pay

## 2015-10-10 NOTE — Telephone Encounter (Signed)
Call report rcvd from [facility].  This RN touched base with patient: pt went to ED Sat and had doppler - on DVT.

## 2015-10-11 ENCOUNTER — Telehealth: Payer: Self-pay

## 2015-10-11 NOTE — Telephone Encounter (Signed)
Pt called to let us know she thinks she has been grabbing her tamoxifen bottle in the evenings rather than her metformin. Therefore she has likely been taking tamoxifen BID for the last 10 days. She thinks this may be the cause of her leg cramping. Instructed pt to go back to taking tamoxifen and metformin as prescribed. Information sent to Dr Lindi Adie.

## 2015-10-26 DIAGNOSIS — R002 Palpitations: Secondary | ICD-10-CM | POA: Diagnosis not present

## 2015-11-01 DIAGNOSIS — I471 Supraventricular tachycardia: Secondary | ICD-10-CM | POA: Diagnosis not present

## 2015-11-01 DIAGNOSIS — E7800 Pure hypercholesterolemia, unspecified: Secondary | ICD-10-CM | POA: Diagnosis not present

## 2015-11-01 DIAGNOSIS — I10 Essential (primary) hypertension: Secondary | ICD-10-CM | POA: Diagnosis not present

## 2015-11-02 ENCOUNTER — Ambulatory Visit (INDEPENDENT_AMBULATORY_CARE_PROVIDER_SITE_OTHER): Payer: Medicare Other | Admitting: Neurology

## 2015-11-02 ENCOUNTER — Encounter: Payer: Self-pay | Admitting: Neurology

## 2015-11-02 VITALS — BP 132/70 | HR 72 | Resp 20 | Ht 61.0 in | Wt 218.0 lb

## 2015-11-02 DIAGNOSIS — Z9989 Dependence on other enabling machines and devices: Principal | ICD-10-CM

## 2015-11-02 DIAGNOSIS — G4733 Obstructive sleep apnea (adult) (pediatric): Secondary | ICD-10-CM

## 2015-11-02 MED FILL — TAMOXIFEN 20 MG TABLET: 20 | 90 days supply | Qty: 90 | Fill #1 | Status: TO

## 2015-11-02 NOTE — Progress Notes (Signed)
PATIENT: ORETA SOLOWAY DOB: [birth year]  REASON FOR VISIT: follow up -obstructive sleep apnea on CPAP  HISTORY FROM: patient  HISTORY OF PRESENT ILLNESS:  Ms. Wojtaszek is a 70 year old female with a history of obstructive sleep apnea on CPAP. She returns today for a 30 day compliance download.   Her download today on 11/02/2015 documents an 87% compliance with an average user time of 6 hours and 37 minutes on all days. The patient's residual AHI has increased to 4.4 which is still good but used to be a little bit lower area to she uses an auto CPAP with a 90's percent pressure of 9.6 cm water the window of pressures between 6 and 12 cm water. She has a very long ramp time of 30 minutes. She has noticed that she has a little bit more trouble to initiate sleep on her new machine and I think this may be related to the very long ramp time. I will shorten the REM time to 12 minutes.  HISTORY 10/27/13:   Ms. Mines is 70 year old female with history of obstructive sleep Apnea on CPAP. She returns today for a 90 day compliance download. She brought her machine with her today and her reports shows an AHI of 2.2 on auto-titration at 6-12 cm of water. She uses her machine for an average of 6 hours and 22 minutes a night, with 97%compliance. His Epworth score is 4 points was previously 3 . His fatigue severity score is 8 was previously 16. Patient reports that she gets about 6.5 hours of sleep a night. She goes to bed around 11:30 pm and arises between 6:30-7:30am. She denies having trouble falling asleep or staying a sleep. States that she normally does not have to get up at night to urinate. Overall patient feels that CPAP has improved his sleepiness and fatigue. Since the last visit the Patient was diagnosed with breast cancer in June 2014. She is on an estrogen blocker now which has caused her to gain weight. She states she is now cancer free.   REVIEW OF SYSTEMS: Out of a complete 14 system review of  symptoms, the patient complains only of the following symptoms, and all other reviewed systems are negative.   back pain, muscle cramps, neck pain, restless leg, apnea, leg swelling  ALLERGIES: Allergies  Allergen Reactions  . Adhesive [Tape] Rash  . Latex Itching and Other (See Comments)    Causes blisters    HOME MEDICATIONS: Outpatient Medications Prior to Visit  Medication Sig Dispense Refill  . albuterol (PROVENTIL HFA;VENTOLIN HFA) 108 (90 Base) MCG/ACT inhaler Inhale 2 puffs into the lungs every 6 (six) hours as needed for wheezing or shortness of breath.    Marland Kitchen aspirin 81 MG tablet Take 81 mg by mouth daily.    Marland Kitchen azelastine (ASTELIN) 0.1 % nasal spray Place 2 sprays into both nostrils 2 (two) times daily. Use in each nostril as directed (Patient taking differently: Place 1 spray into both nostrils 2 (two) times daily as needed for allergies. Use in each nostril as directed) 30 mL 1  . blood glucose meter kit and supplies KIT Test blood sugar once daily. Dx code: E11.9 1 each 0  . calcium-vitamin D (OSCAL WITH D) 500-200 MG-UNIT tablet Take 1 tablet by mouth 2 (two) times daily.    . Cyanocobalamin (VITAMIN B-12 CR PO) Take 500 mcg by mouth daily.     Marland Kitchen glucose blood (ONE TOUCH ULTRA TEST) test strip USE ONE  STRIP TO CHECK GLUCOSE ONCE DAILY 100 each 2  . lisinopril-hydrochlorothiazide (PRINZIDE,ZESTORETIC) 10-12.5 MG tablet TAKE ONE TABLET BY MOUTH ONCE DAILY 90 tablet 0  . lovastatin (MEVACOR) 40 MG tablet Take 1 tablet (40 mg total) by mouth at bedtime. 90 tablet 1  . metFORMIN (GLUCOPHAGE) 500 MG tablet Take 2 tablets (1,000 mg total) by mouth 2 (two) times daily with a meal. (Patient taking differently: Take 500 mg by mouth 3 (three) times daily. ) 360 tablet 3  . Omega-3 Fatty Acids (FISH OIL) 1000 MG CAPS Take 1 capsule by mouth daily.    Glory Rosebush DELICA LANCETS 90Z MISC USE ONE  TO CHECK GLUCOSE ONCE DAILY 100 each 2  . tamoxifen (NOLVADEX) 20 MG tablet Take 1 tablet (20 mg  total) by mouth daily. 90 tablet 3  . Turmeric 500 MG CAPS Take 500 mg by mouth daily.    . vitamin C (ASCORBIC ACID) 500 MG tablet Take 500 mg by mouth daily.     . fluticasone (FLONASE) 50 MCG/ACT nasal spray Place 2 sprays into both nostrils daily. As needed (Patient taking differently: Place 2 sprays into both nostrils daily as needed for allergies. As needed) 16 g 11  . Multiple Vitamins-Minerals (OCUVITE PO) Take 1 capsule by mouth daily.    . Zinc 50 MG TABS Take 1 tablet by mouth daily.     No facility-administered medications prior to visit.     PAST MEDICAL HISTORY: Past Medical History:  Diagnosis Date  . Allergy   . Anemia    due to vaginal bleeding  . Arthritis    hands  . Breast cancer ([place]) 08/2012   left  . Dental crowns present   . Fibroids   . Headache(784.0)    tension  . History of endometriosis   . Hyperlipidemia   . Hypertension    under control with med., has been on med. x 3 yr.  . OSA (obstructive sleep apnea)   . Postmenopausal bleeding   . S/P radiation therapy 10/20/2012-12/05/2012   left breast 50.4 gray, lumpectomy cavity boosted to 62.4 gray  . Seasonal allergies   . Sleep apnea    uses CPAP nightly  . Urinary urgency   . White coat hypertension     PAST SURGICAL HISTORY: Past Surgical History:  Procedure Laterality Date  . BACK SURGERY  1997   lumbar  . BREAST LUMPECTOMY WITH NEEDLE LOCALIZATION AND AXILLARY SENTINEL LYMPH NODE BX Left 09/03/2012   Procedure: BREAST LUMPECTOMY WITH NEEDLE LOCALIZATION AND AXILLARY SENTINEL LYMPH NODE BX;  Surgeon: Edward Jolly, MD;  Location: [facility];  Service: General;  Laterality: Left;  . CHOLECYSTECTOMY  1995  . COLONOSCOPY W/ POLYPECTOMY  03/20/2007  . DILATION AND CURETTAGE OF UTERUS  2005  . DILATION AND CURETTAGE OF UTERUS  06/22/2011   Procedure: DILATATION AND CURETTAGE;  Surgeon: Alvino Chapel, MD;  Location: [facility];  Service: Gynecology;   Laterality: N/A;  OK PER KEELA FOR 7:15 START  . DORSAL COMPARTMENT RELEASE Right 05/19/2013   Procedure: RELEASE 1ST  DORSAL COMPARTMENT RIGHT (DEQUERVAIN);  Surgeon: Cammie Sickle., MD;  Location: [facility];  Service: Orthopedics;  Laterality: Right;  . EXPLORATORY LAPAROTOMY  06-30-2007   ATTEMPTED HYSTERECTOMY ABORTED DUE TO EXTENSIVE ENDOMETRIOSIS W/ DENSE PELVIC ADHESIONS INVOLVING UTERUS AND LOWER RECTOSIGMOID  . HYSTEROSCOPY W/D&C  05/06/2009    FAMILY HISTORY: Family History  Problem Relation Age of Onset  . Hypertension  Other   . Vision loss Mother   . Hypertension Mother   . Anesthesia problems Mother     post-op N/V  . Heart disease Mother   . Osteoporosis Mother   . Depression Father   . Heart disease Sister   . Hypertension Sister   . Hyperlipidemia Sister   . Diabetes Brother   . Hypertension Brother   . Cancer Brother     skin  . Hyperlipidemia Brother   . COPD Daughter   . Cancer Cousin   . Kidney failure Maternal Grandmother   . Heart disease Maternal Grandfather   . Colon cancer Neg Hx     SOCIAL HISTORY: Social History   Social History  . Marital status: Married    Spouse name: [name]  . Number of children: 4  . Years of education: College   Occupational History  . retired    Social History Main Topics  . Smoking status: Never Smoker  . Smokeless tobacco: Never Used  . Alcohol use No  . Drug use: No  . Sexual activity: Yes     Comment: menarche 13, P4, Premarin for short while, Provera x 3 years   Other Topics Concern  . Not on file   Social History Narrative   Marital status: married x 26  years; happily married; no abuse.      Children: 4 children; six grandchildren; no gg.      Lives:  With husband.      Employed: retired in 2011; [employer].      Tobacco: none       Alcohol:  None      Drugs: none      Exercise:  Daily. Walking x 1 hour three days per week.      Seatbelt:  100%       Sunscreen:  Face SPF 15.      Guns:  Loaded mostly secured.      ADLs:  Drives; no assistant devices; independent with all ADLs.       Her nocturia is improved under CPAP use at 10 cm water- AHI is now 0.8 and from 30 at baseline. CMS compliance  6 hours 40 minutes - sleeps on the side, 08-10-11 .FFM - likes it.    . Flonase used prn.     Caffeine Use: 1 cup daily      PHYSICAL EXAM  Vitals:   11/02/15 1100  BP: 132/70  Pulse: 72  Resp: 20  Weight: 218 lb (98.9 kg)  Height: 5' 1" (1.549 m)   Body mass index is 41.19 kg/m.  Generalized: Well developed, in no acute distress  neck:  Circumference 16-1/2 inches, Mallampati 2+ Still morbidly obese.    Neurological examination  Mentation: Alert oriented to time, place, history taking. Follows all commands speech and language fluent Cranial nerve; Mrs. Sherr has noticed a change in her taste sensation and also a loss of smell sensation. She smells things different- her favorite perfume smells awful. Pupils were equal round reactive to light. Extraocular movements were full, visual field were full on confrontational test. Facial sensation and strength were normal. Uvula tongue midline. Head turning and shoulder shrug  were normal and symmetric.     DIAGNOSTIC DATA (LABS, IMAGING, TESTING) - I reviewed patient records, labs, notes, testing and imaging myself where available.  Lab Results  Component Value Date   WBC 7.2 10/08/2015   HGB 13.2 10/08/2015   HCT 39.9 10/08/2015   MCV  89.1 10/08/2015   PLT 270 10/08/2015      Component Value Date/Time   NA 137 10/08/2015 1006   NA 136 06/24/2014 0757   K 3.8 10/08/2015 1006   K 4.3 06/24/2014 0757   CL 100 (L) 10/08/2015 1006   CL 104 08/27/2012 1219   CO2 28 10/08/2015 1006   CO2 25 06/24/2014 0757   GLUCOSE 143 (H) 10/08/2015 1006   GLUCOSE 164 (H) 06/24/2014 0757   GLUCOSE 142 (H) 08/27/2012 1219   BUN 13 10/08/2015 1006   BUN 14.3 06/24/2014 0757   CREATININE 0.76  10/08/2015 1006   CREATININE 0.75 09/08/2015 0955   CREATININE 0.8 06/24/2014 0757   CALCIUM 10.5 (H) 10/08/2015 1006   CALCIUM 9.8 06/24/2014 0757   PROT 7.7 10/08/2015 1006   PROT 7.2 06/24/2014 0757   ALBUMIN 4.2 10/08/2015 1006   ALBUMIN 3.8 06/24/2014 0757   AST 27 10/08/2015 1006   AST 16 06/24/2014 0757   ALT 20 10/08/2015 1006   ALT 19 06/24/2014 0757   ALKPHOS 73 10/08/2015 1006   ALKPHOS 157 (H) 06/24/2014 0757   BILITOT 0.9 10/08/2015 1006   BILITOT 0.45 06/24/2014 0757   GFRNONAA >60 10/08/2015 1006   GFRNONAA 88 05/09/2015 1050   GFRAA >60 10/08/2015 1006   GFRAA >89 05/09/2015 1050   Lab Results  Component Value Date   CHOL 161 09/08/2015   HDL 40 (L) 09/08/2015   LDLCALC 90 09/08/2015   TRIG 153 (H) 09/08/2015   CHOLHDL 4.0 09/08/2015   Lab Results  Component Value Date   HGBA1C 7.9 09/08/2015   No results found for: [id number] Lab Results  Component Value Date   TSH 1.54 09/08/2015      ASSESSMENT AND PLAN 70 y.o. year old female  has a past medical history of Allergy; Anemia; Arthritis; Breast cancer ([place]) (08/2012); Dental crowns present; Fibroids; Headache(784.0); History of endometriosis; Hyperlipidemia; Hypertension; OSA (obstructive sleep apnea); Postmenopausal bleeding; S/P radiation therapy (10/20/2012-12/05/2012); Seasonal allergies; Sleep apnea; Urinary urgency; and White coat hypertension. here with:   1. Obstructive sleep apnea on CPAP,  Main risk factor is obesity. The patient has received a new CPAP machine with wife eye, but ability. This is set for auto pressure between 6 and 12 cm water but she states she has a hard time falling asleep and this may be related to the long ramp time of 30 minutes. I will ask her durable medical equipment company [employer], to change the ramp time to 12 minutes. We have 2 downloads available today one that Mrs. Kofman used here at her primary residence and her older machine but she continues to use  at a holiday home. She has an AHI of only 1.4 on her established machine she has used at 8 hours and 20 minutes during her holiday time it is also set between 6 and 12 cm water it has no ramp setting she uses the same interface in both locations so her AHI differences unexplained except for the ramp time.  Rv in 6 month with NP. Comparing both CPAP readings. DME [employer].       Calea Hribar, MD  11/02/2015, 11:28 AM  A member of the AAN. AASM, ABPN,  [facility] [address] [place], [place] [address] [phone]

## 2015-11-02 NOTE — Patient Instructions (Signed)

## 2015-11-07 ENCOUNTER — Encounter: Payer: Self-pay | Admitting: *Deleted

## 2015-11-07 ENCOUNTER — Encounter: Payer: Self-pay | Admitting: Hematology and Oncology

## 2015-11-07 ENCOUNTER — Ambulatory Visit (HOSPITAL_BASED_OUTPATIENT_CLINIC_OR_DEPARTMENT_OTHER): Payer: Medicare Other | Admitting: Hematology and Oncology

## 2015-11-07 ENCOUNTER — Telehealth: Payer: Self-pay | Admitting: Hematology and Oncology

## 2015-11-07 DIAGNOSIS — Z170 Estrogen receptor positive status [ER+]: Secondary | ICD-10-CM

## 2015-11-07 DIAGNOSIS — C50112 Malignant neoplasm of central portion of left female breast: Secondary | ICD-10-CM

## 2015-11-07 DIAGNOSIS — Z79811 Long term (current) use of aromatase inhibitors: Secondary | ICD-10-CM | POA: Diagnosis not present

## 2015-11-07 NOTE — Progress Notes (Signed)
  Oncology Nurse Navigator Documentation  Navigator Location: [facility]-Med Onc (11/07/15 0900) Navigator Encounter Type: Follow-up Appt (11/07/15 0900)  Patient at [facility] accompanied by her husband.  She reports that she has been doing well.  She did experience an episode with her heart and wore a monitor for 30 days.  She is followed by her PCP.  She reports that she doesn't have any energy.  She has been trying to walk and does feel better after a little exercise.  She denied any questions or concerns at this time.  I encouraged her to call me for any needs.         Patient Visit Type: MedOnc (11/07/15 0900) Treatment Phase: Follow-up (11/07/15 0900) Barriers/Navigation Needs: No barriers at this time (11/07/15 0900)   Interventions: None required (11/07/15 0900)                      Time Spent with Patient: 15 (11/07/15 0900)

## 2015-11-07 NOTE — Progress Notes (Signed)
Patient Care Team: [name], MD as PCP - General (Family Medicine) [name], MD (Family Medicine)  DIAGNOSIS: Cancer of central portion of left female breast [facility])   Staging form: Breast, AJCC 7th Edition   - Clinical: Stage IA (T1c, N0, cM0) - Unsigned         Staging comments: Staged at breast conference 6.11.14    - Pathologic: No stage assigned - Unsigned  SUMMARY OF ONCOLOGIC HISTORY:   Cancer of central portion of left female breast ([place])   08/13/2012 Mammogram    Questionable mass deep to the nipple superior periareolar region with calcifications.U/S 5 x 8 mm mass at 12:00, ovoid cystic lesion at 11:00 1.5X 0.4 cm      08/25/2012 Initial Diagnosis    Invasive mammary cancer with mammary cancer in situ grade 2 with lobular features ER 100%, PR 63%, Ki-67 58%, HER-2 positive ratio 2.48      08/26/2012 Breast MRI    Dominant, biopsied mass with small immediately adjacent satellite mass consistent with biopsy results indicating malignancy, measuring 17 x 11 x 50m      09/03/2012 Surgery    Left breast lumpectomy invasive lobular cancer with lymphovascular invasion and lobular carcinoma in situ positive margins one SLN negative, grade 2; 1.6 cm, ER 100%, PR 63%, HER-2 positive, Ki-67 58% T1 C. N0 M0 stage IA      10/21/2012 - 11/28/2012 Radiation Therapy    Radiation therapy to the lumpectomy site      12/01/2012 -  Anti-estrogen oral therapy    Arimidex 1 mg daily planned duration of treatment 5 years, switched to letrozole January 2016 for musculoskeletal aches and pains, switched to Tamoxifen 07/07/15       CHIEF COMPLIANT: Follow-up on tamoxifen therapy  INTERVAL HISTORY: [name] a 70year old with above-mentioned history of left breast cancer currently on tamoxifen therapy she is tolerating this much better than aromatase inhibitor therapy. She has occasional hot flashes but they're not bothering her too significantly. Denies any muscle skeletal  aches or pains.  REVIEW OF SYSTEMS:   Constitutional: Denies fevers, chills or abnormal weight loss Eyes: Denies blurriness of vision Ears, nose, mouth, throat, and face: Denies mucositis or sore throat Respiratory: Denies cough, dyspnea or wheezes Cardiovascular: Denies palpitation, chest discomfort Gastrointestinal:  Denies nausea, heartburn or change in bowel habits Skin: Denies abnormal skin rashes Lymphatics: Denies new lymphadenopathy or easy bruising Neurological:Denies numbness, tingling or new weaknesses Behavioral/Psych: Mood is stable, no new changes  Extremities: No lower extremity edema Breast:  denies any pain or lumps or nodules in either breasts All other systems were reviewed with the patient and are negative.  I have reviewed the past medical history, past surgical history, social history and family history with the patient and they are unchanged from previous note.  ALLERGIES:  is allergic to adhesive [tape] and latex.  MEDICATIONS:  Current Outpatient Prescriptions  Medication Sig Dispense Refill  . albuterol (PROVENTIL HFA;VENTOLIN HFA) 108 (90 Base) MCG/ACT inhaler Inhale 2 puffs into the lungs every 6 (six) hours as needed for wheezing or shortness of breath.    .Marland Kitchenaspirin 81 MG tablet Take 81 mg by mouth daily.    .Marland Kitchenazelastine (ASTELIN) 0.1 % nasal spray Place 2 sprays into both nostrils 2 (two) times daily. Use in each nostril as directed (Patient taking differently: Place 1 spray into both nostrils 2 (two) times daily as needed for allergies. Use in each nostril as directed)  30 mL 1  . blood glucose meter kit and supplies KIT Test blood sugar once daily. Dx code: E11.9 1 each 0  . calcium-vitamin D (OSCAL WITH D) 500-200 MG-UNIT tablet Take 1 tablet by mouth 2 (two) times daily.    . Cyanocobalamin (VITAMIN B-12 CR PO) Take 500 mcg by mouth daily.     Marland Kitchen glucose blood (ONE TOUCH ULTRA TEST) test strip USE ONE STRIP TO CHECK GLUCOSE ONCE DAILY 100 each 2  .  lisinopril-hydrochlorothiazide (PRINZIDE,ZESTORETIC) 10-12.5 MG tablet TAKE ONE TABLET BY MOUTH ONCE DAILY 90 tablet 0  . lovastatin (MEVACOR) 40 MG tablet Take 1 tablet (40 mg total) by mouth at bedtime. 90 tablet 1  . metFORMIN (GLUCOPHAGE) 500 MG tablet Take 2 tablets (1,000 mg total) by mouth 2 (two) times daily with a meal. (Patient taking differently: Take 500 mg by mouth 3 (three) times daily. ) 360 tablet 3  . metoprolol succinate (TOPROL-XL) 25 MG 24 hr tablet Take 25 mg by mouth daily.    . Omega-3 Fatty Acids (FISH OIL) 1000 MG CAPS Take 1 capsule by mouth daily.    Glory Rosebush DELICA LANCETS 57S MISC USE ONE  TO CHECK GLUCOSE ONCE DAILY 100 each 2  . tamoxifen (NOLVADEX) 20 MG tablet Take 1 tablet (20 mg total) by mouth daily. 90 tablet 3  . Turmeric 500 MG CAPS Take 500 mg by mouth daily.    . vitamin C (ASCORBIC ACID) 500 MG tablet Take 500 mg by mouth daily.      No current facility-administered medications for this visit.     PHYSICAL EXAMINATION: ECOG PERFORMANCE STATUS: 1 - Symptomatic but completely ambulatory  Vitals:   11/07/15 0835  BP: (!) 146/63  Pulse: 74  Resp: 19  Temp: 99.2 F (37.3 C)   Filed Weights   11/07/15 0835  Weight: 217 lb (98.4 kg)    GENERAL:alert, no distress and comfortable SKIN: skin color, texture, turgor are normal, no rashes or significant lesions EYES: normal, Conjunctiva are pink and non-injected, sclera clear OROPHARYNX:no exudate, no erythema and lips, buccal mucosa, and tongue normal  NECK: supple, thyroid normal size, non-tender, without nodularity LYMPH:  no palpable lymphadenopathy in the cervical, axillary or inguinal LUNGS: clear to auscultation and percussion with normal breathing effort HEART: regular rate & rhythm and no murmurs and no lower extremity edema ABDOMEN:abdomen soft, non-tender and normal bowel sounds MUSCULOSKELETAL:no cyanosis of digits and no clubbing  NEURO: alert & oriented x 3 with fluent speech, no  focal motor/sensory deficits EXTREMITIES: No lower extremity edema BREAST: No palpable masses or nodules in either right or left breasts. No palpable axillary supraclavicular or infraclavicular adenopathy no breast tenderness or nipple discharge. (exam performed in the presence of a chaperone)  LABORATORY DATA:  I have reviewed the data as listed   Chemistry      Component Value Date/Time   NA 137 10/08/2015 1006   NA 136 06/24/2014 0757   K 3.8 10/08/2015 1006   K 4.3 06/24/2014 0757   CL 100 (L) 10/08/2015 1006   CL 104 08/27/2012 1219   CO2 28 10/08/2015 1006   CO2 25 06/24/2014 0757   BUN 13 10/08/2015 1006   BUN 14.3 06/24/2014 0757   CREATININE 0.76 10/08/2015 1006   CREATININE 0.75 09/08/2015 0955   CREATININE 0.8 06/24/2014 0757      Component Value Date/Time   CALCIUM 10.5 (H) 10/08/2015 1006   CALCIUM 9.8 06/24/2014 0757   ALKPHOS 73 10/08/2015 1006  ALKPHOS 157 (H) 06/24/2014 0757   AST 27 10/08/2015 1006   AST 16 06/24/2014 0757   ALT 20 10/08/2015 1006   ALT 19 06/24/2014 0757   BILITOT 0.9 10/08/2015 1006   BILITOT 0.45 06/24/2014 0757       Lab Results  Component Value Date   WBC 7.2 10/08/2015   HGB 13.2 10/08/2015   HCT 39.9 10/08/2015   MCV 89.1 10/08/2015   PLT 270 10/08/2015   NEUTROABS 5.1 10/08/2015     ASSESSMENT & PLAN:  Cancer of central portion of left female breast Left breast invasive lobular cancer ER/PR positive HER-2 negative (initial biopsy showed HER-2 positive but final pathology on the lumpectomy did not) status post left breast lumpectomy and radiation and currently on antiestrogen therapy with Arimidex started 12/01/2012.  Arimidex toxicities: severe musculoskeletal aches and pains. After lengthy discussion, we elected to switch her to tamoxifen therapy. I discussed the risks and benefits of tamoxifen the patient including risk of DVT and uterine bleeding.  Right humerus fracture: after fall. Much improved with physical  therapy.  Breast cancer surveillance: 1. Breast exam 12/30/2014 is normal 2. Mammogram 12/21/2014 is normal breast density category B 3. Bone density June 2016: T score -1.4  Hypercalcemia: She has had episodic hypercalcemia. PTH level was normal April 2016 (52). Since we stopped calcium supplementation, her calcium levels have come down. She was restarted on calcium supplementation because of osteopenia T score -1.4  Return to clinic in 6 months for follow-up.   No orders of the defined types were placed in this encounter.  The patient has a good understanding of the overall plan. she agrees with it. she will call with any problems that may develop before the next visit here.   Rulon Eisenmenger, MD 11/07/15

## 2015-11-07 NOTE — Telephone Encounter (Signed)
appt made and avs printed °

## 2015-11-07 NOTE — Assessment & Plan Note (Signed)
Left breast invasive lobular cancer ER/PR positive HER-2 negative (initial biopsy showed HER-2 positive but final pathology on the lumpectomy did not) status post left breast lumpectomy and radiation and currently on antiestrogen therapy with Arimidex started 12/01/2012.  Arimidex toxicities: severe musculoskeletal aches and pains. After lengthy discussion, we elected to switch her to tamoxifen therapy. I discussed the risks and benefits of tamoxifen the patient including risk of DVT and uterine bleeding.  Right humerus fracture: after fall. Much improved with physical therapy.  Breast cancer surveillance: 1. Breast exam 12/30/2014 is normal 2. Mammogram 12/21/2014 is normal breast density category B 3. Bone density June 2016: T score -1.4  Hypercalcemia: She has had episodic hypercalcemia. PTH level was normal April 2016 (52). Since we stopped calcium supplementation, her calcium levels have come down. She was restarted on calcium supplementation because of osteopenia T score -1.4  Return to clinic in 6 months to assess tolerability tamoxifen therapy for follow-up.

## 2015-11-24 DIAGNOSIS — I471 Supraventricular tachycardia: Secondary | ICD-10-CM | POA: Diagnosis not present

## 2015-11-24 DIAGNOSIS — I10 Essential (primary) hypertension: Secondary | ICD-10-CM | POA: Diagnosis not present

## 2015-11-30 ENCOUNTER — Other Ambulatory Visit: Payer: Self-pay | Admitting: Family Medicine

## 2015-12-06 ENCOUNTER — Ambulatory Visit (INDEPENDENT_AMBULATORY_CARE_PROVIDER_SITE_OTHER): Payer: Medicare Other | Admitting: Family Medicine

## 2015-12-06 ENCOUNTER — Encounter: Payer: Self-pay | Admitting: Family Medicine

## 2015-12-06 ENCOUNTER — Other Ambulatory Visit: Payer: Self-pay | Admitting: Family Medicine

## 2015-12-06 VITALS — BP 130/80 | HR 74 | Temp 97.7°F | Resp 17 | Ht 61.0 in | Wt 216.0 lb

## 2015-12-06 DIAGNOSIS — J301 Allergic rhinitis due to pollen: Secondary | ICD-10-CM | POA: Diagnosis not present

## 2015-12-06 DIAGNOSIS — C50112 Malignant neoplasm of central portion of left female breast: Secondary | ICD-10-CM | POA: Diagnosis not present

## 2015-12-06 DIAGNOSIS — M8589 Other specified disorders of bone density and structure, multiple sites: Secondary | ICD-10-CM | POA: Diagnosis not present

## 2015-12-06 DIAGNOSIS — E7800 Pure hypercholesterolemia, unspecified: Secondary | ICD-10-CM

## 2015-12-06 DIAGNOSIS — E119 Type 2 diabetes mellitus without complications: Secondary | ICD-10-CM

## 2015-12-06 DIAGNOSIS — Z9989 Dependence on other enabling machines and devices: Secondary | ICD-10-CM

## 2015-12-06 DIAGNOSIS — Z0000 Encounter for general adult medical examination without abnormal findings: Secondary | ICD-10-CM

## 2015-12-06 DIAGNOSIS — G4733 Obstructive sleep apnea (adult) (pediatric): Secondary | ICD-10-CM | POA: Diagnosis not present

## 2015-12-06 DIAGNOSIS — I10 Essential (primary) hypertension: Secondary | ICD-10-CM | POA: Diagnosis not present

## 2015-12-06 LAB — POCT URINALYSIS DIP (MANUAL ENTRY)
BILIRUBIN UA: NEGATIVE
GLUCOSE UA: NEGATIVE
Ketones, POC UA: NEGATIVE
LEUKOCYTES UA: NEGATIVE
NITRITE UA: NEGATIVE
Protein Ur, POC: NEGATIVE
Spec Grav, UA: <=1.005
Urobilinogen, UA: 0.2
pH, UA: 5

## 2015-12-06 MED ORDER — METOPROLOL SUCCINATE ER 25 MG PO TB24: 25.0000 mg | ORAL_TABLET | Freq: Every day | ORAL | 3 refills | Status: DC

## 2015-12-06 MED ORDER — LISINOPRIL-HYDROCHLOROTHIAZIDE 10-12.5 MG PO TABS: 1.0000 | ORAL_TABLET | Freq: Every day | ORAL | 1 refills | Status: DC

## 2015-12-06 NOTE — Progress Notes (Signed)
By signing my name below, I, Mesha Guinyard, attest that this documentation has been prepared under the direction and in the presence of Reginia Forts, MD.  Electronically Signed: Verlee Monte, Medical Scribe. 12/06/15. 2:07 PM.  Subjective:    Patient ID: [name], female    DOB: 07-07-1945, 70 y.o.   MRN: [id number]  12/06/2015  Annual Exam  HPI  HPI Comments: [name] is a 70 y.o. female with a PMHx of HTN, OSA, and HLD who presents to the [facility] for her annual physical. Pt denies having any surgeries this past year. Pt gets her blood drawn at [facility]. Pt reports having black stool while having diarrhea and it turned white when she stopped having diarrhea.  Sleep Schedule: Pt falls asleep around 9:30pm and gets up around 8:30am. Pt doesn't get up to use the bathroom and she denies urine incontinence  HTN: Pt's bp was 118/60 yesterday. Pt states her bp is higher when she gets here. Pt ran out of Toprol-XL last week. FHX: brother and sister Lab Results  Component Value Date   CREATININE 0.77 12/22/2015    DM: Takes Metformin; 2 in the morning, 1 at lunch and 1 at dinner. Pt reports numbness and tingling in her right foot. FHX: brother  Lab Results  Component Value Date   MICROALBUR 0.2 12/06/2015   HLD: FHx: sister  Asthma: Pt hardly uses her inhaler and states it's probably expired.  Cough: Pt reports episodic coughing throughout the day. Pt states there are no triggers for her coughing and she no longer takes lisinopril.  Cancer Screening:  Breast CA: Pt has a mammogram follow up in Oct 2017. Pt will be going to the oncologist annually. Colon CA: Colonoscopy was nl and she is to repeat in 10 years.  Cervical CA: Pt no longer has to get pap smears.  Skin: Dr. Orvan Seen told her she had a mild skin infection about 6 months ago and didn't get the Rx for it due to financial concerns. She never called back to get another  medication.  Bone Density: Pt's test showed she had mild osteopenia and was told it was probably due to the CA treatment.  Immunizations: Pt deferred her flu shot since she thinks it's too early to get one. Immunization History  Administered Date(s) Administered  . Influenza Split 12/28/2011, 02/23/2013, 02/05/2014  . Influenza,inj,Quad PF,36+ Mos 05/31/2015  . Pneumococcal Conjugate-13 11/16/2013  . Pneumococcal Polysaccharide-23 02/20/2010  . Tdap 11/17/2005  . Zoster 02/16/2010   Vision: Pt has an ophthalmologist appt Oct 20th. Pt denies mouth sores.  OSA: Pt snores and she wears her CPAP every night.  Right side ear pain: Pt lays down at night an gets some dizziness.  Exercise: Pt is going her spinal stenosis and she does her rowing exercise when she remembers, but not as much as she should.  Depression: Pt states Metoprolol helps her with her anxiety. Pt states she's okay, but she gets frustrated because she has to take a lot medication. Pt mentions metoprolol makes food taste nasty. Depression screen [facility] 2/9 12/06/2015 09/08/2015 05/31/2015 05/09/2015 06/21/2014  Decreased Interest 0 0 0 0 0  Down, Depressed, Hopeless 0 0 0 0 0  PHQ - 2 Score 0 0 0 0 0   GERD: Pt has occasional heart burn.     Advance Directives: Pt does have an advance directive. Pt would like to be on a ventilator for a while. Pt's children don't know.  Fall Screening: Fall Risk  12/06/2015 09/08/2015 05/31/2015 05/09/2015 06/21/2014  Falls in the past year? No No Yes Yes No  Number falls in past yr: - - - 1 -   Review of Systems  Constitutional: Negative for activity change, appetite change, chills, diaphoresis, fatigue, fever and unexpected weight change.  HENT: Negative for congestion, dental problem, drooling, ear discharge, ear pain, facial swelling, hearing loss, mouth sores, nosebleeds, postnasal drip, rhinorrhea, sinus pressure, sneezing, sore throat, tinnitus, trouble swallowing and voice change.   Eyes:  Negative for photophobia, pain, discharge, redness, itching and visual disturbance.  Respiratory: Positive for cough. Negative for apnea, choking, chest tightness, shortness of breath, wheezing and stridor.   Cardiovascular: Positive for leg swelling. Negative for chest pain and palpitations.  Gastrointestinal: Negative for abdominal distention, abdominal pain, anal bleeding, blood in stool, constipation, diarrhea, nausea, rectal pain and vomiting.  Endocrine: Negative for cold intolerance, heat intolerance, polydipsia, polyphagia and polyuria.  Genitourinary: Negative for decreased urine volume, difficulty urinating, dyspareunia, dysuria, enuresis, flank pain, frequency, genital sores, hematuria, menstrual problem, pelvic pain, urgency, vaginal bleeding, vaginal discharge and vaginal pain.  Musculoskeletal: Negative for arthralgias, back pain, gait problem, joint swelling, myalgias, neck pain and neck stiffness.  Skin: Negative for color change, pallor, rash and wound.  Allergic/Immunologic: Negative for environmental allergies, food allergies and immunocompromised state.  Neurological: Positive for numbness. Negative for dizziness, tremors, seizures, syncope, facial asymmetry, speech difficulty, weakness, light-headedness and headaches.  Hematological: Negative for adenopathy. Does not bruise/bleed easily.  Psychiatric/Behavioral: Negative for agitation, behavioral problems, confusion, decreased concentration, dysphoric mood, hallucinations, self-injury, sleep disturbance and suicidal ideas. The patient is not nervous/anxious and is not hyperactive.    Past Medical History:  Diagnosis Date  . Allergy   . Anemia    due to vaginal bleeding  . Arthritis    hands  . Breast cancer ([place]) 08/2012   left  . Dental crowns present   . Fibroids   . Headache(784.0)    tension  . History of endometriosis   . Hyperlipidemia   . Hypertension    under control with med., has been on med. x 3 yr.  . OSA  (obstructive sleep apnea)   . Postmenopausal bleeding   . S/P radiation therapy 10/20/2012-12/05/2012   left breast 50.4 gray, lumpectomy cavity boosted to 62.4 gray  . Seasonal allergies   . Sleep apnea    uses CPAP nightly  . Urinary urgency   . White coat hypertension    Past Surgical History:  Procedure Laterality Date  . BACK SURGERY  1997   lumbar  . BREAST LUMPECTOMY WITH NEEDLE LOCALIZATION AND AXILLARY SENTINEL LYMPH NODE BX Left 09/03/2012   Procedure: BREAST LUMPECTOMY WITH NEEDLE LOCALIZATION AND AXILLARY SENTINEL LYMPH NODE BX;  Surgeon: Edward Jolly, MD;  Location: [facility];  Service: General;  Laterality: Left;  . CHOLECYSTECTOMY  1995  . COLONOSCOPY W/ POLYPECTOMY  03/20/2007  . DILATION AND CURETTAGE OF UTERUS  2005  . DILATION AND CURETTAGE OF UTERUS  06/22/2011   Procedure: DILATATION AND CURETTAGE;  Surgeon: Alvino Chapel, MD;  Location: [facility];  Service: Gynecology;  Laterality: N/A;  OK PER KEELA FOR 7:15 START  . DORSAL COMPARTMENT RELEASE Right 05/19/2013   Procedure: RELEASE 1ST  DORSAL COMPARTMENT RIGHT (DEQUERVAIN);  Surgeon: Cammie Sickle., MD;  Location: [facility];  Service: Orthopedics;  Laterality: Right;  . EXPLORATORY LAPAROTOMY  06-30-2007   ATTEMPTED HYSTERECTOMY ABORTED DUE  TO EXTENSIVE ENDOMETRIOSIS W/ DENSE PELVIC ADHESIONS INVOLVING UTERUS AND LOWER RECTOSIGMOID  . HYSTEROSCOPY W/D&C  05/06/2009   Allergies  Allergen Reactions  . Adhesive [Tape] Rash  . Latex Itching and Other (See Comments)    Causes blisters   Current Outpatient Prescriptions  Medication Sig Dispense Refill  . albuterol (PROVENTIL HFA;VENTOLIN HFA) 108 (90 Base) MCG/ACT inhaler Inhale 2 puffs into the lungs every 6 (six) hours as needed for wheezing or shortness of breath.    Marland Kitchen aspirin 81 MG tablet Take 81 mg by mouth daily.    Marland Kitchen azelastine (ASTELIN) 0.1 % nasal spray Place 2 sprays into both nostrils 2 (two)  times daily. Use in each nostril as directed (Patient taking differently: Place 1 spray into both nostrils 2 (two) times daily as needed for allergies. Use in each nostril as directed) 30 mL 1  . blood glucose meter kit and supplies KIT Test blood sugar once daily. Dx code: E11.9 1 each 0  . calcium-vitamin D (OSCAL WITH D) 500-200 MG-UNIT tablet Take 1 tablet by mouth 2 (two) times daily.    . Cyanocobalamin (VITAMIN B-12 CR PO) Take 500 mcg by mouth daily.     Marland Kitchen glucose blood (ONE TOUCH ULTRA TEST) test strip USE ONE STRIP TO CHECK GLUCOSE ONCE DAILY 100 each 2  . lisinopril-hydrochlorothiazide (PRINZIDE,ZESTORETIC) 10-12.5 MG tablet Take 1 tablet by mouth daily. 90 tablet 1  . lovastatin (MEVACOR) 40 MG tablet Take 1 tablet (40 mg total) by mouth at bedtime. 90 tablet 1  . metFORMIN (GLUCOPHAGE) 500 MG tablet Take 2 tablets (1,000 mg total) by mouth 2 (two) times daily with a meal. (Patient taking differently: Take 500 mg by mouth 3 (three) times daily. ) 360 tablet 3  . Omega-3 Fatty Acids (FISH OIL) 1000 MG CAPS Take 1 capsule by mouth daily.    Glory Rosebush DELICA LANCETS 33X MISC USE ONE  TO CHECK GLUCOSE ONCE DAILY 100 each 2  . tamoxifen (NOLVADEX) 20 MG tablet Take 1 tablet (20 mg total) by mouth daily. 90 tablet 3  . Turmeric 500 MG CAPS Take 500 mg by mouth daily.    . vitamin C (ASCORBIC ACID) 500 MG tablet Take 500 mg by mouth daily.     . metoprolol succinate (TOPROL-XL) 25 MG 24 hr tablet Take 1 tablet (25 mg total) by mouth daily. 90 tablet 3   No current facility-administered medications for this visit.    Social History   Social History  . Marital status: Married    Spouse name: [name]  . Number of children: 4  . Years of education: College   Occupational History  . retired    Social History Main Topics  . Smoking status: Never Smoker  . Smokeless tobacco: Never Used  . Alcohol use No  . Drug use: No  . Sexual activity: Yes     Comment: menarche 31, P4, Premarin  for short while, Provera x 3 years   Other Topics Concern  . Not on file   Social History Narrative   Marital status: married x 10  years; happily married; no abuse.      Children: 4 children (3 sons, 1 daughter); six grandchildren; no gg.      Lives:  With husband.      Employed: retired in 2011; [employer].      Tobacco: none       Alcohol:  None      Drugs: none  Exercise:  Rowing 3 days per week.      Seatbelt:  100%      Sunscreen:  Face SPF 15.      Guns:  Loaded mostly secured.      ADLs:  Drives minimally; no assistant devices; independent with all ADLs.       Advanced Directives: YES; FULL CODE but no prolonged measures.      Her nocturia is improved under CPAP use at 10 cm water- AHI is now 0.8 and from 30 at baseline. CMS compliance  6 hours 40 minutes - sleeps on the side, 08-10-11 .FFM - likes it.    . Flonase used prn.     Caffeine Use: 1 cup daily   Family History  Problem Relation Age of Onset  . Vision loss Mother   . Hypertension Mother   . Anesthesia problems Mother     post-op N/V  . Heart disease Mother     carotid stenosis s/p CAE  . Osteoporosis Mother   . Depression Father   . Heart disease Sister   . Hypertension Sister   . Hyperlipidemia Sister   . Diabetes Brother   . Hypertension Brother   . Cancer Brother     skin  . Hyperlipidemia Brother   . Stroke Brother   . COPD Daughter   . Cancer Cousin   . Kidney failure Maternal Grandmother   . Heart disease Maternal Grandfather   . Hypertension Other   . Colon cancer Neg Hx    Objective:    BP 130/80 (BP Location: Right Arm, Patient Position: Sitting, Cuff Size: Large)   Pulse 74   Temp 97.7 F (36.5 C) (Oral)   Resp 17   Ht _0  (1.549 m)   Wt 216 lb (98 kg)   SpO2 98%   BMI 40.81 kg/m  Physical Exam  Constitutional: She is oriented to person, place, and time. She appears well-developed and well-nourished. No distress.  HENT:  Head: Normocephalic and  atraumatic.  Right Ear: External ear normal.  Left Ear: External ear normal.  Nose: Nose normal.  Mouth/Throat: Oropharynx is clear and moist.  Eyes: Conjunctivae and EOM are normal. Pupils are equal, round, and reactive to light.  Neck: Normal range of motion and full passive range of motion without pain. Neck supple. No JVD present. Carotid bruit is not present. No thyromegaly present.  Cardiovascular: Normal rate, regular rhythm and normal heart sounds.  Exam reveals no gallop and no friction rub.   No murmur heard. Pulmonary/Chest: Effort normal and breath sounds normal. She has no wheezes. She has no rales.  Abdominal: Soft. Bowel sounds are normal. She exhibits no distension and no mass. There is no tenderness. There is no rebound and no guarding.  Musculoskeletal:       Right shoulder: Normal.       Left shoulder: Normal.       Cervical back: Normal.  Lymphadenopathy:    She has no cervical adenopathy.  Neurological: She is alert and oriented to person, place, and time. She has normal reflexes. No cranial nerve deficit. She exhibits normal muscle tone. Coordination normal.  Skin: Skin is warm and dry. No rash noted. She is not diaphoretic. No erythema. No pallor.  Psychiatric: She has a normal mood and affect. Her behavior is normal. Judgment and thought content normal.  Nursing note and vitals reviewed.   Assessment & Plan:   1. Encounter for Medicare annual wellness exam   2. Essential  hypertension   3. Obstructive sleep apnea on CPAP   4. Allergic rhinitis due to pollen   5. Type 2 diabetes mellitus without complication, without long-term current use of insulin ([place])   6. Osteopenia   7. Hyperlipidemia   8. Cancer of central portion of left female breast ([place])   9. Pure hypercholesterolemia    -anticipatory guidance -obtain labs. -refills provided. -if cough does not resolve in upcoming two weeks, call office; will switch Lisinopril to Losartan.  Orders Placed This  Encounter  Procedures  . Microalbumin, urine  . Comprehensive metabolic panel  . Lipid panel  . TSH  . Hemoglobin A1c  . POCT urinalysis dipstick   Meds ordered this encounter  Medications  . metoprolol succinate (TOPROL-XL) 25 MG 24 hr tablet    Sig: Take 1 tablet (25 mg total) by mouth daily.    Dispense:  90 tablet    Refill:  3  . lisinopril-hydrochlorothiazide (PRINZIDE,ZESTORETIC) 10-12.5 MG tablet    Sig: Take 1 tablet by mouth daily.    Dispense:  90 tablet    Refill:  1    Return in about 4 months (around 04/06/2016) for recheck diabetes, high blood pressure, high cholesterol.  I personally performed the services described in this documentation, which was scribed in my presence. The recorded information has been reviewed and considered.   Naevia Unterreiner Elayne Guerin, M.D. [facility] [address] [place], [place]  [phone] phone [phone] fax

## 2015-12-06 NOTE — Patient Instructions (Addendum)
   IF you received an x-ray today, you will receive an invoice from [facility]. Please contact [facility] at [phone] with questions or concerns regarding your invoice.   IF you received labwork today, you will receive an invoice from [facility]. Please contact [facility] at [phone] with questions or concerns regarding your invoice.   Our billing staff will not be able to assist you with questions regarding bills from these companies.  You will be contacted with the lab results as soon as they are available. The fastest way to get your results is to activate your My Chart account. Instructions are located on the last page of this paperwork. If you have not heard from us regarding the results in 2 weeks, please contact this office.    Keeping You Healthy  Get These Tests  Blood Pressure- Have your blood pressure checked by your healthcare provider at least once a year.  Normal blood pressure is 120/80.  Weight- Have your body mass index (BMI) calculated to screen for obesity.  BMI is a measure of body fat based on height and weight.  You can calculate your own BMI at www.nhlbisupport.com/bmi/  Cholesterol- Have your cholesterol checked every year.  Diabetes- Have your blood sugar checked every year if you have high blood pressure, high cholesterol, a family history of diabetes or if you are overweight.  Pap Test - Have a pap test every 1 to 5 years if you have been sexually active.  If you are older than 65 and recent pap tests have been normal you may not need additional pap tests.  In addition, if you have had a hysterectomy  for benign disease additional pap tests are not necessary.  Mammogram-Yearly mammograms are essential for early detection of breast cancer  Screening for Colon Cancer- Colonoscopy starting at age 50. Screening may begin sooner depending on your family history and other health conditions.  Follow up colonoscopy  as directed by your Gastroenterologist.  Screening for Osteoporosis- Screening begins at age 65 with bone density scanning, sooner if you are at higher risk for developing Osteoporosis.  Get these medicines  Calcium with Vitamin D- Your body requires 1200-1500 mg of Calcium a day and 800-1000 IU of Vitamin D a day.  You can only absorb 500 mg of Calcium at a time therefore Calcium must be taken in 2 or 3 separate doses throughout the day.  Hormones- Hormone therapy has been associated with increased risk for certain cancers and heart disease.  Talk to your healthcare provider about if you need relief from menopausal symptoms.  Aspirin- Ask your healthcare provider about taking Aspirin to prevent Heart Disease and Stroke.  Get these Immuniztions  Flu shot- Every fall  Pneumonia shot- Once after the age of 65; if you are younger ask your healthcare provider if you need a pneumonia shot.  Tetanus- Every ten years.  Zostavax- Once after the age of 60 to prevent shingles.  Take these steps  Don't smoke- Your healthcare provider can help you quit. For tips on how to quit, ask your healthcare provider or go to www.smokefree.gov or call 1-800 QUIT-NOW.  Be physically active- Exercise 5 days a week for a minimum of 30 minutes.  If you are not already physically active, start slow and gradually work up to 30 minutes of moderate physical activity.  Try walking, dancing, bike riding, swimming, etc.  Eat a healthy diet- Eat a variety of healthy foods such as fruits, vegetables, whole   grains, low fat milk, low fat cheeses, yogurt, lean meats, chicken, fish, eggs, dried beans, tofu, etc.  For more information go to www.thenutritionsource.org  Dental visit- Brush and floss teeth twice daily; visit your dentist twice a year.  Eye exam- Visit your Optometrist or Ophthalmologist yearly.  Drink alcohol in moderation- Limit alcohol intake to one drink or less a day.  Never drink and  drive.  Depression- Your emotional health is as important as your physical health.  If you're feeling down or losing interest in things you normally enjoy, please talk to your healthcare provider.  Seat Belts- can save your life; always wear one  Smoke/Carbon Monoxide detectors- These detectors need to be installed on the appropriate level of your home.  Replace batteries at least once a year.  Violence- If anyone is threatening or hurting you, please tell your healthcare provider.  Living Will/ Health care power of attorney- Discuss with your healthcare provider and family. 

## 2015-12-07 LAB — MICROALBUMIN, URINE: Microalb, Ur: 0.2 mg/dL

## 2015-12-22 DIAGNOSIS — E7800 Pure hypercholesterolemia, unspecified: Secondary | ICD-10-CM | POA: Diagnosis not present

## 2015-12-22 DIAGNOSIS — E119 Type 2 diabetes mellitus without complications: Secondary | ICD-10-CM | POA: Diagnosis not present

## 2015-12-22 DIAGNOSIS — Z853 Personal history of malignant neoplasm of breast: Secondary | ICD-10-CM | POA: Diagnosis not present

## 2015-12-22 LAB — CBC WITH DIFFERENTIAL/PLATELET
BASOS PCT: 1 %
Basophils Absolute: 64 cells/uL (ref 0–200)
EOS ABS: 256 {cells}/uL (ref 15–500)
Eosinophils Relative: 4 %
HCT: 38.6 % (ref 35.0–45.0)
Hemoglobin: 12.5 g/dL (ref 11.7–15.5)
LYMPHS PCT: 25 %
Lymphs Abs: 1600 cells/uL (ref 850–3900)
MCH: 28.7 pg (ref 27.0–33.0)
MCHC: 32.4 g/dL (ref 32.0–36.0)
MCV: 88.7 fL (ref 80.0–100.0)
MONOS PCT: 7 %
MPV: 10.1 fL (ref 7.5–12.5)
Monocytes Absolute: 448 cells/uL (ref 200–950)
NEUTROS PCT: 63 %
Neutro Abs: 4032 cells/uL (ref 1500–7800)
PLATELETS: 230 10*3/uL (ref 140–400)
RBC: 4.35 MIL/uL (ref 3.80–5.10)
RDW: 13.3 % (ref 11.0–15.0)
WBC: 6.4 10*3/uL (ref 3.8–10.8)

## 2015-12-22 LAB — HM MAMMOGRAPHY

## 2015-12-23 LAB — COMPREHENSIVE METABOLIC PANEL
ALT: 17 U/L (ref 6–29)
AST: 21 U/L (ref 10–35)
Albumin: 3.8 g/dL (ref 3.6–5.1)
Alkaline Phosphatase: 67 U/L (ref 33–130)
BUN: 14 mg/dL (ref 7–25)
CALCIUM: 8.9 mg/dL (ref 8.6–10.4)
CHLORIDE: 98 mmol/L (ref 98–110)
CO2: 24 mmol/L (ref 20–31)
Creat: 0.77 mg/dL (ref 0.60–0.93)
GLUCOSE: 104 mg/dL — AB (ref 65–99)
POTASSIUM: 3.8 mmol/L (ref 3.5–5.3)
Sodium: 133 mmol/L — ABNORMAL LOW (ref 135–146)
Total Bilirubin: 0.5 mg/dL (ref 0.2–1.2)
Total Protein: 6.5 g/dL (ref 6.1–8.1)

## 2015-12-23 LAB — LIPID PANEL
CHOL/HDL RATIO: 3.7 ratio (ref <=5.0)
Cholesterol: 140 mg/dL (ref 125–200)
HDL: 38 mg/dL — AB (ref >=46)
LDL CALC: 75 mg/dL (ref <130)
Triglycerides: 137 mg/dL (ref <150)
VLDL: 27 mg/dL (ref <30)

## 2015-12-23 LAB — HEMOGLOBIN A1C
HEMOGLOBIN A1C: 5.8 % — AB (ref <5.7)
Mean Plasma Glucose: 120 mg/dL

## 2015-12-23 LAB — TSH: TSH: 1.5 m[IU]/L

## 2016-01-02 DIAGNOSIS — I10 Essential (primary) hypertension: Secondary | ICD-10-CM | POA: Diagnosis not present

## 2016-01-02 DIAGNOSIS — I471 Supraventricular tachycardia: Secondary | ICD-10-CM | POA: Diagnosis not present

## 2016-01-02 DIAGNOSIS — E7800 Pure hypercholesterolemia, unspecified: Secondary | ICD-10-CM | POA: Diagnosis not present

## 2016-01-06 DIAGNOSIS — E119 Type 2 diabetes mellitus without complications: Secondary | ICD-10-CM | POA: Diagnosis not present

## 2016-01-06 DIAGNOSIS — H2513 Age-related nuclear cataract, bilateral: Secondary | ICD-10-CM | POA: Diagnosis not present

## 2016-01-06 DIAGNOSIS — H40013 Open angle with borderline findings, low risk, bilateral: Secondary | ICD-10-CM | POA: Diagnosis not present

## 2016-01-06 LAB — HM DIABETES EYE EXAM

## 2016-01-12 DIAGNOSIS — Z23 Encounter for immunization: Secondary | ICD-10-CM | POA: Diagnosis not present

## 2016-02-27 ENCOUNTER — Telehealth: Payer: Self-pay | Admitting: Family Medicine

## 2016-02-27 MED ORDER — LOVASTATIN 40 MG PO TABS: 40.0000 mg | ORAL_TABLET | Freq: Every day | ORAL | 1 refills | Status: DC

## 2016-02-27 NOTE — Telephone Encounter (Signed)
Patient calling requesting refill of Lovastatin.  Refill approved.

## 2016-04-18 ENCOUNTER — Telehealth: Payer: Self-pay

## 2016-04-18 NOTE — Telephone Encounter (Signed)
Pt has an upcoming appt with dr Tamala Julian and last time she saw here for a diabetes check they had a hard time getting blood and sent her to [facility] and it was fine does dr Tamala Julian want to do that again for this upcoming viist  Best number [phone]

## 2016-04-18 NOTE — Telephone Encounter (Signed)
12/2015 last labs, will they repeated at this visit? Does she need to be fasting?

## 2016-04-24 ENCOUNTER — Ambulatory Visit: Payer: Medicare Other | Admitting: Family Medicine

## 2016-05-07 ENCOUNTER — Ambulatory Visit (INDEPENDENT_AMBULATORY_CARE_PROVIDER_SITE_OTHER): Payer: Medicare Other | Admitting: Adult Health

## 2016-05-07 ENCOUNTER — Encounter: Payer: Self-pay | Admitting: Adult Health

## 2016-05-07 VITALS — BP 142/69 | HR 77 | Ht 61.0 in | Wt 215.4 lb

## 2016-05-07 DIAGNOSIS — G4733 Obstructive sleep apnea (adult) (pediatric): Secondary | ICD-10-CM | POA: Diagnosis not present

## 2016-05-07 DIAGNOSIS — Z9989 Dependence on other enabling machines and devices: Secondary | ICD-10-CM

## 2016-05-07 NOTE — Progress Notes (Signed)
I agree with the assessment and plan as directed by NP .The patient is known to me .   Riley Hallum, MD  

## 2016-05-07 NOTE — Patient Instructions (Signed)
Continue using CPAP nightly We will call your DME company for a Download.  If your symptoms worsen or you develop new symptoms please let us know.

## 2016-05-07 NOTE — Progress Notes (Signed)
LMVM for pt on her home # that her CPAP download looked ok.  She is to call back if questions.

## 2016-05-07 NOTE — Progress Notes (Addendum)
I received the patient CPAP download from her DME company. It appears the patient has used her CPAP nightly for the last 30 days. Each night she uses her machine greater than 4 hours. On average she uses her machine 8 hours and 38 minutes. Her average pressure is 9.6 cm of water. Her residual AHI is 3.9. 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

## 2016-05-07 NOTE — Progress Notes (Signed)
  PATIENT: [name] DOB: 06/18/1945  REASON FOR VISIT: follow up- obstructive sleep apnea on CPAP HISTORY FROM: patient  HISTORY OF PRESENT ILLNESS: [name] is a 71-year-old female with a history of obstructive sleep apnea on CPAP. She returns today for a compliance download. Unfortunately we are unable to obtain a wireless download. We will have her DME company fax us over a 30 day download. The patient states that she continues to use her CPAP nightly. Denies any trouble falling asleep. She continues to feel that the machine works well. She does note that sometimes if she sleeps on her side she will notice that the mask leaks slightly. She does change out her supplies regularly. She reports that she lost her eldest son on February 5. This was unexpected. She denies any new neurological symptoms. Returns today for an evaluation.  HISTORY  11/02/15 (DOHMEIER): [name] is a 71-year-old female with a history of obstructive sleep apnea on CPAP. She returns today for a 30 day compliance download.   Her download today on 11/02/2015 documents an 87% compliance with an average user time of 6 hours and 37 minutes on all days. The patient's residual AHI has increased to 4.4 which is still good but used to be a little bit lower area to she uses an auto CPAP with a 90's percent pressure of 9.6 cm water the window of pressures between 6 and 12 cm water. She has a very long ramp time of 30 minutes. She has noticed that she has a little bit more trouble to initiate sleep on her new machine and I think this may be related to the very long ramp time. I will shorten the REM time to 12 minutes.  HISTORY 10/27/13:   [name] is 71 year old female with history of obstructive sleep Apnea on CPAP. She returns today for a 90 day compliance download. She brought her machine with her today and her reports shows an AHI of 2.2 on auto-titration at 6-12 cm of water. She uses her machine for an average of 6  hours and 22 minutes a night, with 97%compliance. His Epworth score is 4 points was previously 3 . His fatigue severity score is 8 was previously 16. Patient reports that she gets about 6.5 hours of sleep a night. She goes to bed around 11:30 pm and arises between 6:30-7:30am. She denies having trouble falling asleep or staying a sleep. States that she normally does not have to get up at night to urinate. Overall patient feels that CPAP has improved his sleepiness and fatigue. Since the last visit the Patient was diagnosed with breast cancer in June 2014. She is on an estrogen blocker now which has caused her to gain weight. She states she is now cancer free.   REVIEW OF SYSTEMS: Out of a complete 14 system review of symptoms, the patient complains only of the following symptoms, and all other reviewed systems are negative.  Back pain, cough, wheezing, runny nose, chills, eye itching, environmental allergies  ALLERGIES: Allergies  Allergen Reactions  . Adhesive [Tape] Rash  . Latex Itching and Other (See Comments)    Causes blisters    HOME MEDICATIONS: Outpatient Medications Prior to Visit  Medication Sig Dispense Refill  . albuterol (PROVENTIL HFA;VENTOLIN HFA) 108 (90 Base) MCG/ACT inhaler Inhale 2 puffs into the lungs every 6 (six) hours as needed for wheezing or shortness of breath.    . aspirin 81 MG tablet Take 81 mg by mouth   daily.    . blood glucose meter kit and supplies KIT Test blood sugar once daily. Dx code: E11.9 1 each 0  . Cyanocobalamin (VITAMIN B-12 CR PO) Take 500 mcg by mouth daily.     Marland Kitchen glucose blood (ONE TOUCH ULTRA TEST) test strip USE ONE STRIP TO CHECK GLUCOSE ONCE DAILY 100 each 2  . lisinopril-hydrochlorothiazide (PRINZIDE,ZESTORETIC) 10-12.5 MG tablet Take 1 tablet by mouth daily. 90 tablet 1  . lovastatin (MEVACOR) 40 MG tablet Take 1 tablet (40 mg total) by mouth at bedtime. 90 tablet 1  . metFORMIN (GLUCOPHAGE) 500 MG tablet Take 2 tablets (1,000 mg total)  by mouth 2 (two) times daily with a meal. (Patient taking differently: Take 500 mg by mouth 3 (three) times daily. ) 360 tablet 3  . metoprolol succinate (TOPROL-XL) 25 MG 24 hr tablet Take 1 tablet (25 mg total) by mouth daily. 90 tablet 3  . Omega-3 Fatty Acids (FISH OIL) 1000 MG CAPS Take 1 capsule by mouth daily.    Glory Rosebush DELICA LANCETS 62M MISC USE ONE  TO CHECK GLUCOSE ONCE DAILY 100 each 2  . tamoxifen (NOLVADEX) 20 MG tablet Take 1 tablet (20 mg total) by mouth daily. 90 tablet 3  . Turmeric 500 MG CAPS Take 500 mg by mouth daily.    . vitamin C (ASCORBIC ACID) 500 MG tablet Take 500 mg by mouth daily.     Marland Kitchen azelastine (ASTELIN) 0.1 % nasal spray Place 2 sprays into both nostrils 2 (two) times daily. Use in each nostril as directed (Patient not taking: Reported on 05/07/2016) 30 mL 1  . calcium-vitamin D (OSCAL WITH D) 500-200 MG-UNIT tablet Take 1 tablet by mouth 2 (two) times daily.     No facility-administered medications prior to visit.     PAST MEDICAL HISTORY: Past Medical History:  Diagnosis Date  . Allergy   . Anemia    due to vaginal bleeding  . Arthritis    hands  . Breast cancer ([place]) 08/2012   left  . Dental crowns present   . Fibroids   . Headache(784.0)    tension  . History of endometriosis   . Hyperlipidemia   . Hypertension    under control with med., has been on med. x 3 yr.  . OSA (obstructive sleep apnea)   . Postmenopausal bleeding   . S/P radiation therapy 10/20/2012-12/05/2012   left breast 50.4 gray, lumpectomy cavity boosted to 62.4 gray  . Seasonal allergies   . Sleep apnea    uses CPAP nightly  . Urinary urgency   . White coat hypertension     PAST SURGICAL HISTORY: Past Surgical History:  Procedure Laterality Date  . BACK SURGERY  1997   lumbar  . BREAST LUMPECTOMY WITH NEEDLE LOCALIZATION AND AXILLARY SENTINEL LYMPH NODE BX Left 09/03/2012   Procedure: BREAST LUMPECTOMY WITH NEEDLE LOCALIZATION AND AXILLARY SENTINEL LYMPH NODE BX;   Surgeon: Edward Jolly, MD;  Location: [facility];  Service: General;  Laterality: Left;  . CHOLECYSTECTOMY  1995  . COLONOSCOPY W/ POLYPECTOMY  03/20/2007  . DILATION AND CURETTAGE OF UTERUS  2005  . DILATION AND CURETTAGE OF UTERUS  06/22/2011   Procedure: DILATATION AND CURETTAGE;  Surgeon: Alvino Chapel, MD;  Location: [facility];  Service: Gynecology;  Laterality: N/A;  OK PER KEELA FOR 7:15 START  . DORSAL COMPARTMENT RELEASE Right 05/19/2013   Procedure: RELEASE 1ST  DORSAL COMPARTMENT RIGHT (DEQUERVAIN);  Surgeon: Herbie Baltimore  V Sypher Jr., MD;  Location:  [facility];  Service: Orthopedics;  Laterality: Right;  . EXPLORATORY LAPAROTOMY  06-30-2007   ATTEMPTED HYSTERECTOMY ABORTED DUE TO EXTENSIVE ENDOMETRIOSIS W/ DENSE PELVIC ADHESIONS INVOLVING UTERUS AND LOWER RECTOSIGMOID  . HYSTEROSCOPY W/D&C  05/06/2009    FAMILY HISTORY: Family History  Problem Relation Age of Onset  . Vision loss Mother   . Hypertension Mother   . Anesthesia problems Mother     post-op N/V  . Heart disease Mother     carotid stenosis s/p CAE  . Osteoporosis Mother   . Depression Father   . Heart disease Sister   . Hypertension Sister   . Hyperlipidemia Sister   . Diabetes Brother   . Hypertension Brother   . Cancer Brother     skin  . Hyperlipidemia Brother   . Stroke Brother   . COPD Daughter   . Cancer Cousin   . Kidney failure Maternal Grandmother   . Heart disease Maternal Grandfather   . Hypertension Other   . Colon cancer Neg Hx     SOCIAL HISTORY: Social History   Social History  . Marital status: Married    Spouse name: Phillip  . Number of children: 4  . Years of education: College   Occupational History  . retired    Social History Main Topics  . Smoking status: Never Smoker  . Smokeless tobacco: Never Used  . Alcohol use No  . Drug use: No  . Sexual activity: Yes     Comment: menarche 13, P4, Premarin for short while,  Provera x 3 years   Other Topics Concern  . Not on file   Social History Narrative   Marital status: married x 51  years; happily married; no abuse.      Children: 4 children (3 sons, 1 daughter); six grandchildren; no gg.      Lives:  With husband.      Employed: retired in 2011; [employer].      Tobacco: none       Alcohol:  None      Drugs: none      Exercise:  Rowing 3 days per week.      Seatbelt:  100%      Sunscreen:  Face SPF 15.      Guns:  Loaded mostly secured.      ADLs:  Drives minimally; no assistant devices; independent with all ADLs.       Advanced Directives: YES; FULL CODE but no prolonged measures.      Her nocturia is improved under CPAP use at 10 cm water- AHI is now 0.8 and from 30 at baseline. CMS compliance  6 hours 40 minutes - sleeps on the side, 08-10-11 .FFM - likes it.    . Flonase used prn.     Caffeine Use: 1 cup daily      PHYSICAL EXAM  Vitals:   05/07/16 0803  BP: (!) 142/69  Pulse: 77  Weight: 215 lb 6.4 oz (97.7 kg)  Height: 5' 1" (1.549 m)   Body mass index is 40.7 kg/m.  Generalized: Well developed, in no acute distress   Neurological examination  Mentation: Alert oriented to time, place, history taking. Follows all commands speech and language fluent Cranial nerve II-XII: Pupils were equal round reactive to light. Extraocular movements were full, visual field were full on confrontational test. Facial sensation and strength were normal. Uvula tongue midline. Head turning and shoulder shrug    were normal and symmetric.Neck Circumference 16 inches Mallampati 2-3+ Motor: The motor testing reveals 5 over 5 strength of all 4 extremities. Good symmetric motor tone is noted throughout.  Sensory: Sensory testing is intact to soft touch on all 4 extremities. No evidence of extinction is noted.  Coordination: Cerebellar testing reveals good finger-nose-finger and heel-to-shin bilaterally.  Gait and station: Gait is normal.    Reflexes: Deep tendon reflexes are symmetric and normal bilaterally.   DIAGNOSTIC DATA (LABS, IMAGING, TESTING) - I reviewed patient records, labs, notes, testing and imaging myself where available.  Lab Results  Component Value Date   WBC 6.4 12/22/2015   HGB 12.5 12/22/2015   HCT 38.6 12/22/2015   MCV 88.7 12/22/2015   PLT 230 12/22/2015      Component Value Date/Time   NA 133 (L) 12/22/2015 0940   NA 136 06/24/2014 0757   K 3.8 12/22/2015 0940   K 4.3 06/24/2014 0757   CL 98 12/22/2015 0940   CL 104 08/27/2012 1219   CO2 24 12/22/2015 0940   CO2 25 06/24/2014 0757   GLUCOSE 104 (H) 12/22/2015 0940   GLUCOSE 164 (H) 06/24/2014 0757   GLUCOSE 142 (H) 08/27/2012 1219   BUN 14 12/22/2015 0940   BUN 14.3 06/24/2014 0757   CREATININE 0.77 12/22/2015 0940   CREATININE 0.8 06/24/2014 0757   CALCIUM 8.9 12/22/2015 0940   CALCIUM 9.8 06/24/2014 0757   PROT 6.5 12/22/2015 0940   PROT 7.2 06/24/2014 0757   ALBUMIN 3.8 12/22/2015 0940   ALBUMIN 3.8 06/24/2014 0757   AST 21 12/22/2015 0940   AST 16 06/24/2014 0757   ALT 17 12/22/2015 0940   ALT 19 06/24/2014 0757   ALKPHOS 67 12/22/2015 0940   ALKPHOS 157 (H) 06/24/2014 0757   BILITOT 0.5 12/22/2015 0940   BILITOT 0.45 06/24/2014 0757   GFRNONAA >60 10/08/2015 1006   GFRNONAA 88 05/09/2015 1050   GFRAA >60 10/08/2015 1006   GFRAA >89 05/09/2015 1050   Lab Results  Component Value Date   CHOL 140 12/22/2015   HDL 38 (L) 12/22/2015   LDLCALC 75 12/22/2015   TRIG 137 12/22/2015   CHOLHDL 3.7 12/22/2015   Lab Results  Component Value Date   HGBA1C 5.8 (H) 12/22/2015    Lab Results  Component Value Date   TSH 1.50 12/22/2015      ASSESSMENT AND PLAN 71 y.o. year old female  has a past medical history of Allergy; Anemia; Arthritis; Breast cancer ([place]) (08/2012); Dental crowns present; Fibroids; Headache(784.0); History of endometriosis; Hyperlipidemia; Hypertension; OSA (obstructive sleep apnea); Postmenopausal  bleeding; S/P radiation therapy (10/20/2012-12/05/2012); Seasonal allergies; Sleep apnea; Urinary urgency; and White coat hypertension. here with:  1. Obstructive sleep apnea on CPAP  Overall the patient is doing well. We will have her DME company fax [place] a 30 day download. Patient is encouraged to continue using the CPAP nightly. Advised that if her symptoms worsen or she develops new symptoms she should let us know. Follow-up in one year or sooner if needed.     Ward Givens, MSN, NP-C 05/07/2016, 8:11 AM [facility] [address] [place], [place] [address] [phone]

## 2016-05-08 NOTE — Assessment & Plan Note (Signed)
Left breast invasive lobular cancerER/PR positive HER-2 negative (initial biopsy showed HER-2 positive but final pathology on the lumpectomy did not) status post left breast lumpectomy and radiation and currently on antiestrogen therapy with Arimidex started 12/01/2012.  Arimidex toxicities: severe musculoskeletal aches and pains. After lengthy discussion, we elected to switch her to tamoxifen therapy. I discussed the risks and benefits of tamoxifen the patient including risk of DVT and uterine bleeding.  Right humerus fracture: after fall. Much improved with physical therapy.  Breast cancer surveillance: 1. Breast exam today 21 2018 is normal 2. Mammogram 12/21/2015 is normal breast density category B 3. Bone density June 2016: T score -1.4  Hypercalcemia:She has had episodic hypercalcemia. PTH level was normal April 2016 (52). Since we stopped calcium supplementation, her calcium levels have come down. She was restarted on calcium supplementation because of osteopenia T score -1.4  Return to clinic in 1 year for follow-up.

## 2016-05-09 ENCOUNTER — Encounter: Payer: Self-pay | Admitting: Hematology and Oncology

## 2016-05-09 ENCOUNTER — Ambulatory Visit (HOSPITAL_BASED_OUTPATIENT_CLINIC_OR_DEPARTMENT_OTHER): Payer: Medicare Other | Admitting: Hematology and Oncology

## 2016-05-09 ENCOUNTER — Encounter: Payer: Self-pay | Admitting: *Deleted

## 2016-05-09 DIAGNOSIS — C50112 Malignant neoplasm of central portion of left female breast: Secondary | ICD-10-CM | POA: Diagnosis not present

## 2016-05-09 DIAGNOSIS — Z79811 Long term (current) use of aromatase inhibitors: Secondary | ICD-10-CM

## 2016-05-09 DIAGNOSIS — Z170 Estrogen receptor positive status [ER+]: Secondary | ICD-10-CM

## 2016-05-09 NOTE — Progress Notes (Signed)
Patient Care Team: [name], MD as PCP - General (Family Medicine) [name], MD (Family Medicine)  DIAGNOSIS:  Encounter Diagnosis  Name Primary?  . Malignant neoplasm of central portion of left breast in female, estrogen receptor positive ([place])     SUMMARY OF ONCOLOGIC HISTORY:   Cancer of central portion of left female breast ([place])   08/13/2012 Mammogram    Questionable mass deep to the nipple superior periareolar region with calcifications.U/S 5 x 8 mm mass at 12:00, ovoid cystic lesion at 11:00 1.5X 0.4 cm      08/25/2012 Initial Diagnosis    Invasive mammary cancer with mammary cancer in situ grade 2 with lobular features ER 100%, PR 63%, Ki-67 58%, HER-2 positive ratio 2.48      08/26/2012 Breast MRI    Dominant, biopsied mass with small immediately adjacent satellite mass consistent with biopsy results indicating malignancy, measuring 17 x 11 x 42m      09/03/2012 Surgery    Left breast lumpectomy invasive lobular cancer with lymphovascular invasion and lobular carcinoma in situ positive margins one SLN negative, grade 2; 1.6 cm, ER 100%, PR 63%, HER-2 positive, Ki-67 58% T1 C. N0 M0 stage IA      10/21/2012 - 11/28/2012 Radiation Therapy    Radiation therapy to the lumpectomy site      12/01/2012 -  Anti-estrogen oral therapy    Arimidex 1 mg daily planned duration of treatment 5 years, switched to letrozole January 2016 for musculoskeletal aches and pains, switched to Tamoxifen 07/07/15       CHIEF COMPLIANT: Follow-up on tamoxifen therapy  INTERVAL HISTORY: Diane FOGALis a 71year old with above-mentioned history of left breast cancer underwent lumpectomy followed by radiation and is currently on antiestrogen therapy. She could not tolerate aromatase inhibitors and is currently on tamoxifen. She is a tolerating tamoxifen extremely well. She denies any significant hot flashes or myalgias. She denies any lumps or nodules in the breast.  REVIEW OF  SYSTEMS:   Constitutional: Denies fevers, chills or abnormal weight loss Eyes: Denies blurriness of vision Ears, nose, mouth, throat, and face: Denies mucositis or sore throat Respiratory: Denies cough, dyspnea or wheezes Cardiovascular: Denies palpitation, chest discomfort Gastrointestinal:  Denies nausea, heartburn or change in bowel habits Skin: Denies abnormal skin rashes Lymphatics: Denies new lymphadenopathy or easy bruising Neurological:Denies numbness, tingling or new weaknesses Behavioral/Psych: Mood is stable, no new changes  Extremities: No lower extremity edema Breast:  denies any pain or lumps or nodules in either breasts All other systems were reviewed with the patient and are negative.  I have reviewed the past medical history, past surgical history, social history and family history with the patient and they are unchanged from previous note.  ALLERGIES:  is allergic to adhesive [tape] and latex.  MEDICATIONS:  Current Outpatient Prescriptions  Medication Sig Dispense Refill  . albuterol (PROVENTIL HFA;VENTOLIN HFA) 108 (90 Base) MCG/ACT inhaler Inhale 2 puffs into the lungs every 6 (six) hours as needed for wheezing or shortness of breath.    .Marland Kitchenaspirin 81 MG tablet Take 81 mg by mouth daily.    .Marland Kitchenazelastine (ASTELIN) 0.1 % nasal spray Place 2 sprays into both nostrils 2 (two) times daily. Use in each nostril as directed (Patient not taking: Reported on 05/07/2016) 30 mL 1  . blood glucose meter kit and supplies KIT Test blood sugar once daily. Dx code: E11.9 1 each 0  . cholecalciferol (VITAMIN D) 1000 units tablet Take  1,000 Units by mouth daily.    . Cyanocobalamin (VITAMIN B-12 CR PO) Take 500 mcg by mouth daily.     Marland Kitchen glucose blood (ONE TOUCH ULTRA TEST) test strip USE ONE STRIP TO CHECK GLUCOSE ONCE DAILY 100 each 2  . lisinopril-hydrochlorothiazide (PRINZIDE,ZESTORETIC) 10-12.5 MG tablet Take 1 tablet by mouth daily. 90 tablet 1  . lovastatin (MEVACOR) 40 MG tablet  Take 1 tablet (40 mg total) by mouth at bedtime. 90 tablet 1  . metFORMIN (GLUCOPHAGE) 500 MG tablet Take 2 tablets (1,000 mg total) by mouth 2 (two) times daily with a meal. (Patient taking differently: Take 500 mg by mouth 3 (three) times daily. ) 360 tablet 3  . metoprolol succinate (TOPROL-XL) 25 MG 24 hr tablet Take 1 tablet (25 mg total) by mouth daily. 90 tablet 3  . Omega-3 Fatty Acids (FISH OIL) 1000 MG CAPS Take 1 capsule by mouth daily.    Glory Rosebush DELICA LANCETS 63A MISC USE ONE  TO CHECK GLUCOSE ONCE DAILY 100 each 2  . tamoxifen (NOLVADEX) 20 MG tablet Take 1 tablet (20 mg total) by mouth daily. 90 tablet 3  . Turmeric 500 MG CAPS Take 500 mg by mouth daily.    . vitamin C (ASCORBIC ACID) 500 MG tablet Take 500 mg by mouth daily.      No current facility-administered medications for this visit.     PHYSICAL EXAMINATION: ECOG PERFORMANCE STATUS: 1 - Symptomatic but completely ambulatory  Vitals:   05/09/16 0852  BP: (!) 153/59  Pulse: 77  Resp: 18  Temp: 98 F (36.7 C)   Filed Weights   05/09/16 0852  Weight: 215 lb 6.4 oz (97.7 kg)    GENERAL:alert, no distress and comfortable SKIN: skin color, texture, turgor are normal, no rashes or significant lesions EYES: normal, Conjunctiva are pink and non-injected, sclera clear OROPHARYNX:no exudate, no erythema and lips, buccal mucosa, and tongue normal  NECK: supple, thyroid normal size, non-tender, without nodularity LYMPH:  no palpable lymphadenopathy in the cervical, axillary or inguinal LUNGS: clear to auscultation and percussion with normal breathing effort HEART: regular rate & rhythm and no murmurs and no lower extremity edema ABDOMEN:abdomen soft, non-tender and normal bowel sounds MUSCULOSKELETAL:no cyanosis of digits and no clubbing  NEURO: alert & oriented x 3 with fluent speech, no focal motor/sensory deficits EXTREMITIES: No lower extremity edema BREAST: No palpable masses or nodules in either right or  left breasts. No palpable axillary supraclavicular or infraclavicular adenopathy no breast tenderness or nipple discharge. (exam performed in the presence of a chaperone)  LABORATORY DATA:  I have reviewed the data as listed   Chemistry      Component Value Date/Time   NA 133 (L) 12/22/2015 0940   NA 136 06/24/2014 0757   K 3.8 12/22/2015 0940   K 4.3 06/24/2014 0757   CL 98 12/22/2015 0940   CL 104 08/27/2012 1219   CO2 24 12/22/2015 0940   CO2 25 06/24/2014 0757   BUN 14 12/22/2015 0940   BUN 14.3 06/24/2014 0757   CREATININE 0.77 12/22/2015 0940   CREATININE 0.8 06/24/2014 0757      Component Value Date/Time   CALCIUM 8.9 12/22/2015 0940   CALCIUM 9.8 06/24/2014 0757   ALKPHOS 67 12/22/2015 0940   ALKPHOS 157 (H) 06/24/2014 0757   AST 21 12/22/2015 0940   AST 16 06/24/2014 0757   ALT 17 12/22/2015 0940   ALT 19 06/24/2014 0757   BILITOT 0.5 12/22/2015 0940  BILITOT 0.45 06/24/2014 0757       Lab Results  Component Value Date   WBC 6.4 12/22/2015   HGB 12.5 12/22/2015   HCT 38.6 12/22/2015   MCV 88.7 12/22/2015   PLT 230 12/22/2015   NEUTROABS 4,032 12/22/2015    ASSESSMENT & PLAN:  Cancer of central portion of left female breast Left breast invasive lobular cancerER/PR positive HER-2 negative (initial biopsy showed HER-2 positive but final pathology on the lumpectomy did not) status post left breast lumpectomy and radiation and currently on antiestrogen therapy with Arimidex started 12/01/2012.  Tamoxifen toxicities: Marked improvement in her ability to tolerate the medication. Denies any significant hot flashes. She does have muscle aches especially in the lower back but they're not as bad as before..  Right humerus fracture: after fall. Much improved.  Breast cancer surveillance: 1. Breast exam today 21 2018 is normal 2. Mammogram 12/21/2015 is normal breast density category B 3. Bone density June 2016: T score -1.4  Hypercalcemia:She has had  episodic hypercalcemia. PTH level was normal April 2016 (52). Since we stopped calcium supplementation, her calcium levels have come down. She was restarted on calcium supplementation because of osteopenia T score -1.4  Return to clinic in 1 year for follow-up.   I spent 15 minutes talking to the patient of which more than half was spent in counseling and coordination of care.  No orders of the defined types were placed in this encounter.  The patient has a good understanding of the overall plan. she agrees with it. she will call with any problems that may develop before the next visit here.   Rulon Eisenmenger, MD 05/09/16

## 2016-05-09 NOTE — Progress Notes (Signed)
  Oncology Nurse Navigator Documentation  Navigator Location: [facility] (05/09/16 0900)   )Navigator Encounter Type: Follow-up Appt (05/09/16 0900)  Patient accompanied by her husband.  They report that it has been a rough month.  Their son died unexpectedly in early 2022/06/05.  Patient and husband both tearful.  I listened as they talked about their son.  Patient said she does not have any needs at this time.  I encouraged her to call me if there is anything I can do for her.  Patient's husband verified that they have my phone number.                   Patient Visit Type: MedOnc (05/09/16 0900) Treatment Phase: Follow-up (05/09/16 0900) Barriers/Navigation Needs: Family concerns (Pt's son died unexpectantly this month) (05/09/16 0900)   Interventions: Psycho-social support (05/09/16 0900)                      Time Spent with Patient: 15 (05/09/16 0900)

## 2016-06-26 DIAGNOSIS — I10 Essential (primary) hypertension: Secondary | ICD-10-CM | POA: Diagnosis not present

## 2016-06-26 DIAGNOSIS — I471 Supraventricular tachycardia: Secondary | ICD-10-CM | POA: Diagnosis not present

## 2016-06-26 DIAGNOSIS — E7800 Pure hypercholesterolemia, unspecified: Secondary | ICD-10-CM | POA: Diagnosis not present

## 2016-07-27 ENCOUNTER — Other Ambulatory Visit: Payer: Self-pay | Admitting: Hematology and Oncology

## 2016-07-27 DIAGNOSIS — C50112 Malignant neoplasm of central portion of left female breast: Secondary | ICD-10-CM

## 2016-08-01 ENCOUNTER — Encounter: Payer: Self-pay | Admitting: Family Medicine

## 2016-08-01 ENCOUNTER — Ambulatory Visit (INDEPENDENT_AMBULATORY_CARE_PROVIDER_SITE_OTHER): Payer: Medicare Other | Admitting: Family Medicine

## 2016-08-01 VITALS — BP 150/70 | HR 72 | Temp 97.4°F | Resp 17 | Ht 61.0 in | Wt 217.0 lb

## 2016-08-01 DIAGNOSIS — J301 Allergic rhinitis due to pollen: Secondary | ICD-10-CM | POA: Diagnosis not present

## 2016-08-01 DIAGNOSIS — E119 Type 2 diabetes mellitus without complications: Secondary | ICD-10-CM

## 2016-08-01 DIAGNOSIS — Z9989 Dependence on other enabling machines and devices: Secondary | ICD-10-CM

## 2016-08-01 DIAGNOSIS — G4733 Obstructive sleep apnea (adult) (pediatric): Secondary | ICD-10-CM | POA: Diagnosis not present

## 2016-08-01 DIAGNOSIS — I10 Essential (primary) hypertension: Secondary | ICD-10-CM

## 2016-08-01 DIAGNOSIS — R059 Cough, unspecified: Secondary | ICD-10-CM

## 2016-08-01 DIAGNOSIS — C50112 Malignant neoplasm of central portion of left female breast: Secondary | ICD-10-CM

## 2016-08-01 DIAGNOSIS — M8589 Other specified disorders of bone density and structure, multiple sites: Secondary | ICD-10-CM

## 2016-08-01 DIAGNOSIS — Z170 Estrogen receptor positive status [ER+]: Secondary | ICD-10-CM

## 2016-08-01 DIAGNOSIS — R05 Cough: Secondary | ICD-10-CM

## 2016-08-01 DIAGNOSIS — M48062 Spinal stenosis, lumbar region with neurogenic claudication: Secondary | ICD-10-CM

## 2016-08-01 DIAGNOSIS — E7800 Pure hypercholesterolemia, unspecified: Secondary | ICD-10-CM | POA: Diagnosis not present

## 2016-08-01 MED ORDER — METOPROLOL SUCCINATE ER 25 MG PO TB24: 25.0000 mg | ORAL_TABLET | Freq: Every day | ORAL | 1 refills | Status: DC

## 2016-08-01 MED ORDER — AZELASTINE HCL 0.1 % NA SOLN: 2.0000 | Freq: Two times a day (BID) | NASAL | 5 refills | Status: DC

## 2016-08-01 MED ORDER — LISINOPRIL-HYDROCHLOROTHIAZIDE 10-12.5 MG PO TABS: 1.0000 | ORAL_TABLET | Freq: Every day | ORAL | 1 refills | Status: DC

## 2016-08-01 MED ORDER — GLUCOSE BLOOD VI STRP: ORAL_STRIP | 3 refills | Status: DC

## 2016-08-01 MED ORDER — METOPROLOL SUCCINATE ER 50 MG PO TB24
50.0000 mg | ORAL_TABLET | Freq: Every day | ORAL | 1 refills | Status: DC
Start: 2016-08-01 — End: 2016-08-01

## 2016-08-01 MED ORDER — LOVASTATIN 40 MG PO TABS: 40.0000 mg | ORAL_TABLET | Freq: Every day | ORAL | 1 refills | Status: DC

## 2016-08-01 NOTE — Progress Notes (Signed)
Subjective:    Patient ID: [name], female    DOB: 03-20-1945, 71 y.o.   MRN: [id number]  08/01/2016  Diabetes; Hypertension; and Depression (Son passed away in 26-May-2022)   HPI This 71 y.o. female presents for three month follow-up of DMII, HTN, grief reaction due to death of son, allergic rhinitis.  Patient reports POOR compliance with medication, good tolerance to medication, and good symptom control.  Admits to non-compliance with exercise and diet.    Questions:  if needs to continue to take fish oil.  Has been non-compliant with Lovastatin.  Only taking Metformin two every morning.   Has Silver Sneakers yet not going; must drive to town.  No copay with physical therapy.    BP Readings from Last 3 Encounters:  08/01/16 (!) 150/70  05/09/16 (!) 153/59  05/07/16 (!) 142/69   Wt Readings from Last 3 Encounters:  08/01/16 217 lb (98.4 kg)  05/09/16 215 lb 6.4 oz (97.7 kg)  05/07/16 215 lb 6.4 oz (97.7 kg)   Immunization History  Administered Date(s) Administered  . Influenza Split 12/28/2011, 02/23/2013, 02/05/2014  . Influenza,inj,Quad PF,36+ Mos 05/31/2015  . Pneumococcal Conjugate-13 11/16/2013  . Pneumococcal Polysaccharide-23 02/20/2010  . Tdap 11/17/2005  . Zoster 02/16/2010     Review of Systems  Constitutional: Negative for chills, diaphoresis, fatigue and fever.  Eyes: Negative for visual disturbance.  Respiratory: Negative for cough and shortness of breath.   Cardiovascular: Negative for chest pain, palpitations and leg swelling.  Gastrointestinal: Negative for abdominal pain, constipation, diarrhea, nausea and vomiting.  Endocrine: Negative for cold intolerance, heat intolerance, polydipsia, polyphagia and polyuria.  Musculoskeletal: Positive for arthralgias and back pain.  Neurological: Negative for dizziness, tremors, seizures, syncope, facial asymmetry, speech difficulty, weakness, light-headedness, numbness and headaches.  Psychiatric/Behavioral:  Positive for dysphoric mood.    Past Medical History:  Diagnosis Date  . Allergy   . Anemia    due to vaginal bleeding  . Arthritis    hands  . Breast cancer ([place]) 08/2012   left  . Dental crowns present   . Fibroids   . Headache(784.0)    tension  . History of endometriosis   . Hyperlipidemia   . Hypertension    under control with med., has been on med. x 3 yr.  . OSA (obstructive sleep apnea)   . Postmenopausal bleeding   . S/P radiation therapy 10/20/2012-12/05/2012   left breast 50.4 gray, lumpectomy cavity boosted to 62.4 gray  . Seasonal allergies   . Sleep apnea    uses CPAP nightly  . Urinary urgency   . White coat hypertension    Past Surgical History:  Procedure Laterality Date  . BACK SURGERY  1997   lumbar  . BREAST LUMPECTOMY WITH NEEDLE LOCALIZATION AND AXILLARY SENTINEL LYMPH NODE BX Left 09/03/2012   Procedure: BREAST LUMPECTOMY WITH NEEDLE LOCALIZATION AND AXILLARY SENTINEL LYMPH NODE BX;  Surgeon: Edward Jolly, MD;  Location: [facility];  Service: General;  Laterality: Left;  . CHOLECYSTECTOMY  1995  . COLONOSCOPY W/ POLYPECTOMY  03/20/2007  . DILATION AND CURETTAGE OF UTERUS  2005  . DILATION AND CURETTAGE OF UTERUS  06/22/2011   Procedure: DILATATION AND CURETTAGE;  Surgeon: Alvino Chapel, MD;  [facility];  Service: Gynecology;  Laterality: N/A;  OK PER KEELA FOR 7:15 START  . DORSAL COMPARTMENT RELEASE Right 05/19/2013   Procedure: RELEASE 1ST  DORSAL COMPARTMENT RIGHT (DEQUERVAIN);  Surgeon: Cammie Sickle.,  MD;  Location: [facility];  Service: Orthopedics;  Laterality: Right;  . EXPLORATORY LAPAROTOMY  06-30-2007   ATTEMPTED HYSTERECTOMY ABORTED DUE TO EXTENSIVE ENDOMETRIOSIS W/ DENSE PELVIC ADHESIONS INVOLVING UTERUS AND LOWER RECTOSIGMOID  . HYSTEROSCOPY W/D&C  05/06/2009   Allergies  Allergen Reactions  . Adhesive [Tape] Rash  . Latex Itching and Other (See Comments)    Causes  blisters    Social History   Social History  . Marital status: Married    Spouse name: [name]  . Number of children: 4  . Years of education: College   Occupational History  . retired    Social History Main Topics  . Smoking status: Never Smoker  . Smokeless tobacco: Never Used  . Alcohol use No  . Drug use: No  . Sexual activity: Yes     Comment: menarche 39, P4, Premarin for short while, Provera x 3 years   Other Topics Concern  . Not on file   Social History Narrative   Marital status: married x 85  years; happily married; no abuse.      Children: 4 children (3 sons, 1 daughter); six grandchildren; no gg.      Lives:  With husband.      Employed: retired in 2011; [employer].      Tobacco: none       Alcohol:  None      Drugs: none      Exercise:  Rowing 3 days per week.      Seatbelt:  100%      Sunscreen:  Face SPF 15.      Guns:  Loaded mostly secured.      ADLs:  Drives minimally; no assistant devices; independent with all ADLs.       Advanced Directives: YES; FULL CODE but no prolonged measures.      Her nocturia is improved under CPAP use at 10 cm water- AHI is now 0.8 and from 30 at baseline. CMS compliance  6 hours 40 minutes - sleeps on the side, 08-10-11 .FFM - likes it.    . Flonase used prn.     Caffeine Use: 1 cup daily   Family History  Problem Relation Age of Onset  . Vision loss Mother   . Hypertension Mother   . Anesthesia problems Mother        post-op N/V  . Heart disease Mother        carotid stenosis s/p CAE  . Osteoporosis Mother   . Depression Father   . Heart disease Sister   . Hypertension Sister   . Hyperlipidemia Sister   . Diabetes Brother   . Hypertension Brother   . Cancer Brother        skin  . Hyperlipidemia Brother   . Stroke Brother   . COPD Daughter   . Cancer Cousin   . Kidney failure Maternal Grandmother   . Heart disease Maternal Grandfather   . Hypertension Other   . Colon cancer Neg Hx          Objective:    BP (!) 150/70 (BP Location: Right Arm, Patient Position: Sitting, Cuff Size: Large)   Pulse 72   Temp 97.4 F (36.3 C) (Oral)   Resp 17   Ht 5\' 1"  (1.549 m)   Wt 217 lb (98.4 kg)   SpO2 99%   BMI 41.00 kg/m  Physical Exam  Constitutional: She is oriented to person, place, and time. She appears well-developed  and well-nourished. No distress.  HENT:  Head: Normocephalic and atraumatic.  Right Ear: External ear normal.  Left Ear: External ear normal.  Nose: Nose normal.  Mouth/Throat: Oropharynx is clear and moist.  Eyes: Conjunctivae and EOM are normal. Pupils are equal, round, and reactive to light.  Neck: Normal range of motion. Neck supple. Carotid bruit is not present. No thyromegaly present.  Cardiovascular: Normal rate, regular rhythm, normal heart sounds and intact distal pulses.  Exam reveals no gallop and no friction rub.   No murmur heard. Pulmonary/Chest: Effort normal and breath sounds normal. She has no wheezes. She has no rales.  Abdominal: Soft. Bowel sounds are normal. She exhibits no distension and no mass. There is no tenderness. There is no rebound and no guarding.  Lymphadenopathy:    She has no cervical adenopathy.  Neurological: She is alert and oriented to person, place, and time. No cranial nerve deficit. She exhibits normal muscle tone. Coordination normal.  Skin: Skin is warm and dry. No rash noted. She is not diaphoretic. No erythema. No pallor.  Psychiatric: She has a normal mood and affect. Her behavior is normal. Judgment and thought content normal.   Results for orders placed or performed in visit on 04/27/16  HM DIABETES EYE EXAM  Result Value Ref Range   HM Diabetic Eye Exam No Retinopathy No Retinopathy       Assessment & Plan:   1. Type 2 diabetes mellitus without complication, without long-term current use of insulin ([place])   2. Essential hypertension   3. Obstructive sleep apnea on CPAP   4. Seasonal allergic  rhinitis due to pollen   5. Osteopenia of multiple sites   6. Malignant neoplasm of central portion of left breast in female, estrogen receptor positive ([place])   7. Pure hypercholesterolemia   8. Spinal stenosis of lumbar region with neurogenic claudication   9. Cough    -uncontrolled blood pressure; increase Metoprolol to 50mg  daily. -highly encourage exercise for weight management, hypertension,hypercholesterolemia,DMII, grief reaction. Pt not willing to exercise at this time. -recommend restarting Lovastatin therapy. -recommend increasing Metformin to 2000mg  daily. -cough due to uncontrolled allergic rhinitis; restart Astelin and Flonase.     Orders Placed This Encounter  Procedures  . CBC with Differential/Platelet  . Comprehensive metabolic panel    Order Specific Question:   Has the patient fasted?    Answer:   Yes  . Hemoglobin A1c  . Lipid panel    Order Specific Question:   Has the patient fasted?    Answer:   Yes   Meds ordered this encounter  Medications  . metoprolol succinate (TOPROL-XL) 50 MG 24 hr tablet    Sig: Take 1 tablet (50 mg total) by mouth daily.    Dispense:  90 tablet    Refill:  1    No Follow-up on file.   [name] Elayne Guerin, M.D. Primary Care at [facility] [address] [place], [place]  [address] [phone] phone [phone] fax

## 2016-08-01 NOTE — Patient Instructions (Addendum)
RESTART ASTELIN NASAL SPRAY TWICE DAILY. CONTINUE FLONASE ONCE DAILY. INCREASE METOPROLOL ER TO 25MG  ONE TABLET DAILY.   Deconditioning Deconditioning refers to the changes in your body that occur during a period of inactivity. The changes happen in your heart, lungs, and muscles. They decrease your ability to be active, and they make you feel tired and weak. There are three stages of deconditioning:  Mild deconditioning. At this stage, you will notice a change in your ability to do your usual exercise activities, such as running, biking, or swimming.  Moderate deconditioning. At this stage, you will notice a change in your ability to do normal everyday activities, such as walking, grocery shopping, and doing chores.  Severe deconditioning. At this stage, you will notice a change in your ability to do minimal activity or normal self-care. Deconditioning can occur after only a few days of inactivity. The longer the period of inactivity, the more severe the deconditioning will be, and the longer it will take to return to your previous level of functioning. What are the causes? Deconditioning is often caused by inactivity due to:  Illnesses, such as cancer, stroke, heart attack, fibromyalgia, and chronic fatigue syndrome.  Injuries, especially back injuries, broken bones, and ligament and tendon injuries.  A long stay in the hospital.  Pregnancy, especially if long periods of bed rest are needed. What increases the risk? This condition is more likely to develop in:  People who are hospitalized.  People on bed rest.  People who are obese.  People with poor nutrition.  Elderly adults.  People with injuries or illnesses that interfere with movement and activity. What are the signs or symptoms? Symptoms of deconditioning include:  Weakness.  Tiredness.  Shortness of breath with minor exertion.  A faster-than-normal heartbeat. You may not notice this without taking your  pulse.  Pain or discomfort with activity.  Decreased strength.  Decreased sense of balance.  Decreased endurance.  Difficulty doing your usual forms of exercise.  Difficulty doing activities of daily living, such as grocery shopping or chores.  Difficulty walking around the house and doing basic self-care, such as getting to the bathroom, preparing meals, or doing laundry. How is this diagnosed? Deconditioning is diagnosed based on your medical history and a physical exam. During the physical exam, your health care provider will check for signs of deconditioning, such as:  Decreased size of muscles.  Decreased strength.  Trouble with balance.  Shortness of breath or abnormally increased heart rate after minor exertion. How is this treated? Treatment for deconditioning usually involves following a structured exercise program in which activity is increased gradually. Your health care provider will determine which exercises are right for you. The exercise program will likely include aerobic exercise and strength training:  Aerobic exercise helps improve the functioning of the heart and lungs as well as the muscles.  Strength training helps improve muscle size and strength. Both of these types of exercise will improve your endurance. You may be referred to a physical therapist who can create a safe strengthening program for you to follow. Follow these instructions at home:  Follow the exercise program that is recommended by your health care provider or physical therapist.  Do not increase your exercise any faster than directed.  Eat a healthy diet.  Do not use any products that contain nicotine or tobacco, such as cigarettes and e-cigarettes. If you need help quitting, ask your health care provider.  Take over-the-counter and prescription medicines only as told by your  health care provider.  Keep all follow-up visits as told by your health care provider. This is  important. Contact a health care provider if:  You are not able to carry out the prescribed exercise program.  You are becoming more and more fatigued and weak.  You become light-headed when rising to a sitting or standing position.  Your level of endurance decreases after it has improved. Get help right away if:  You have chest pain.  You are very short of breath.  You have any episodes of passing out. This information is not intended to replace advice given to you by your health care provider. Make sure you discuss any questions you have with your health care provider. Document Released: 07/20/2013 Document Revised: 09/23/2015 Document Reviewed: 06/04/2015 Elsevier Interactive Patient Education  2017 [employer].    IF you received an x-ray today, you will receive an invoice from [facility]. Please contact [facility] at [phone] with questions or concerns regarding your invoice.   IF you received labwork today, you will receive an invoice from [facility]. Please contact [facility] at [phone] with questions or concerns regarding your invoice.   Our billing staff will not be able to assist you with questions regarding bills from these companies.  You will be contacted with the lab results as soon as they are available. The fastest way to get your results is to activate your My Chart account. Instructions are located on the last page of this paperwork. If you have not heard from [place] regarding the results in 2 weeks, please contact this office.

## 2016-08-02 LAB — CBC WITH DIFFERENTIAL/PLATELET
BASOS ABS: 0 10*3/uL (ref 0.0–0.2)
Basos: 0 %
EOS (ABSOLUTE): 0.1 10*3/uL (ref 0.0–0.4)
Eos: 2 %
Hematocrit: 37.4 % (ref 34.0–46.6)
Hemoglobin: 12.3 g/dL (ref 11.1–15.9)
Immature Grans (Abs): 0 10*3/uL (ref 0.0–0.1)
Immature Granulocytes: 0 %
LYMPHS ABS: 1.3 10*3/uL (ref 0.7–3.1)
Lymphs: 25 %
MCH: 28.9 pg (ref 26.6–33.0)
MCHC: 32.9 g/dL (ref 31.5–35.7)
MCV: 88 fL (ref 79–97)
MONOS ABS: 0.5 10*3/uL (ref 0.1–0.9)
Monocytes: 9 %
Neutrophils Absolute: 3.4 10*3/uL (ref 1.4–7.0)
Neutrophils: 64 %
PLATELETS: 223 10*3/uL (ref 150–379)
RBC: 4.26 x10E6/uL (ref 3.77–5.28)
RDW: 13.4 % (ref 12.3–15.4)
WBC: 5.4 10*3/uL (ref 3.4–10.8)

## 2016-08-02 LAB — COMPREHENSIVE METABOLIC PANEL
ALT: 20 IU/L (ref 0–32)
AST: 26 IU/L (ref 0–40)
Albumin/Globulin Ratio: 1.4 (ref 1.2–2.2)
Albumin: 4 g/dL (ref 3.5–4.8)
Alkaline Phosphatase: 64 IU/L (ref 39–117)
BUN/Creatinine Ratio: 16 (ref 12–28)
BUN: 11 mg/dL (ref 8–27)
Bilirubin Total: 0.6 mg/dL (ref 0.0–1.2)
CHLORIDE: 96 mmol/L (ref 96–106)
CO2: 23 mmol/L (ref 18–29)
Calcium: 9.8 mg/dL (ref 8.7–10.3)
Creatinine, Ser: 0.67 mg/dL (ref 0.57–1.00)
GFR calc Af Amer: 102 mL/min/{1.73_m2} (ref >59)
GFR, EST NON AFRICAN AMERICAN: 89 mL/min/{1.73_m2} (ref >59)
GLOBULIN, TOTAL: 2.9 g/dL (ref 1.5–4.5)
GLUCOSE: 119 mg/dL — AB (ref 65–99)
POTASSIUM: 4.7 mmol/L (ref 3.5–5.2)
Sodium: 140 mmol/L (ref 134–144)
Total Protein: 6.9 g/dL (ref 6.0–8.5)

## 2016-08-02 LAB — LIPID PANEL
CHOLESTEROL TOTAL: 219 mg/dL — AB (ref 100–199)
Chol/HDL Ratio: 4.3 ratio (ref 0.0–4.4)
HDL: 51 mg/dL (ref >39)
LDL Calculated: 135 mg/dL — ABNORMAL HIGH (ref 0–99)
TRIGLYCERIDES: 164 mg/dL — AB (ref 0–149)
VLDL CHOLESTEROL CAL: 33 mg/dL (ref 5–40)

## 2016-08-02 LAB — HEMOGLOBIN A1C
ESTIMATED AVERAGE GLUCOSE: 137 mg/dL
Hgb A1c MFr Bld: 6.4 % — ABNORMAL HIGH (ref 4.8–5.6)

## 2016-09-25 DIAGNOSIS — Z85828 Personal history of other malignant neoplasm of skin: Secondary | ICD-10-CM | POA: Diagnosis not present

## 2016-09-25 DIAGNOSIS — L718 Other rosacea: Secondary | ICD-10-CM | POA: Diagnosis not present

## 2016-09-25 DIAGNOSIS — D1801 Hemangioma of skin and subcutaneous tissue: Secondary | ICD-10-CM | POA: Diagnosis not present

## 2016-09-25 DIAGNOSIS — L570 Actinic keratosis: Secondary | ICD-10-CM | POA: Diagnosis not present

## 2016-09-25 DIAGNOSIS — L821 Other seborrheic keratosis: Secondary | ICD-10-CM | POA: Diagnosis not present

## 2016-10-12 DIAGNOSIS — C50912 Malignant neoplasm of unspecified site of left female breast: Secondary | ICD-10-CM | POA: Diagnosis not present

## 2016-10-16 ENCOUNTER — Other Ambulatory Visit: Payer: Self-pay | Admitting: Family Medicine

## 2016-10-23 ENCOUNTER — Other Ambulatory Visit: Payer: Self-pay

## 2016-10-23 DIAGNOSIS — I10 Essential (primary) hypertension: Secondary | ICD-10-CM

## 2016-10-23 MED ORDER — LISINOPRIL-HYDROCHLOROTHIAZIDE 10-12.5 MG PO TABS: 1.0000 | ORAL_TABLET | Freq: Every day | ORAL | 1 refills | Status: DC

## 2016-11-06 DIAGNOSIS — M5388 Other specified dorsopathies, sacral and sacrococcygeal region: Secondary | ICD-10-CM | POA: Diagnosis not present

## 2016-11-06 DIAGNOSIS — M4317 Spondylolisthesis, lumbosacral region: Secondary | ICD-10-CM | POA: Diagnosis not present

## 2016-11-06 DIAGNOSIS — M9903 Segmental and somatic dysfunction of lumbar region: Secondary | ICD-10-CM | POA: Diagnosis not present

## 2016-11-06 DIAGNOSIS — Z6841 Body Mass Index (BMI) 40.0 and over, adult: Secondary | ICD-10-CM | POA: Diagnosis not present

## 2016-11-06 DIAGNOSIS — N958 Other specified menopausal and perimenopausal disorders: Secondary | ICD-10-CM | POA: Diagnosis not present

## 2016-11-06 DIAGNOSIS — Z124 Encounter for screening for malignant neoplasm of cervix: Secondary | ICD-10-CM | POA: Diagnosis not present

## 2016-11-06 DIAGNOSIS — Z01419 Encounter for gynecological examination (general) (routine) without abnormal findings: Secondary | ICD-10-CM | POA: Diagnosis not present

## 2016-11-06 DIAGNOSIS — M4306 Spondylolysis, lumbar region: Secondary | ICD-10-CM | POA: Diagnosis not present

## 2016-11-06 DIAGNOSIS — M8588 Other specified disorders of bone density and structure, other site: Secondary | ICD-10-CM | POA: Diagnosis not present

## 2016-11-06 DIAGNOSIS — M9904 Segmental and somatic dysfunction of sacral region: Secondary | ICD-10-CM | POA: Diagnosis not present

## 2016-11-06 DIAGNOSIS — M9905 Segmental and somatic dysfunction of pelvic region: Secondary | ICD-10-CM | POA: Diagnosis not present

## 2016-11-07 ENCOUNTER — Ambulatory Visit: Payer: Medicare Other | Admitting: Family Medicine

## 2016-11-09 DIAGNOSIS — M9905 Segmental and somatic dysfunction of pelvic region: Secondary | ICD-10-CM | POA: Diagnosis not present

## 2016-11-09 DIAGNOSIS — M4317 Spondylolisthesis, lumbosacral region: Secondary | ICD-10-CM | POA: Diagnosis not present

## 2016-11-09 DIAGNOSIS — M4306 Spondylolysis, lumbar region: Secondary | ICD-10-CM | POA: Diagnosis not present

## 2016-11-09 DIAGNOSIS — M9904 Segmental and somatic dysfunction of sacral region: Secondary | ICD-10-CM | POA: Diagnosis not present

## 2016-11-09 DIAGNOSIS — M5388 Other specified dorsopathies, sacral and sacrococcygeal region: Secondary | ICD-10-CM | POA: Diagnosis not present

## 2016-11-09 DIAGNOSIS — M9903 Segmental and somatic dysfunction of lumbar region: Secondary | ICD-10-CM | POA: Diagnosis not present

## 2016-11-16 ENCOUNTER — Telehealth: Payer: Self-pay

## 2016-11-16 NOTE — Telephone Encounter (Signed)
Called pt to schedule Medicare Annual Wellness Visit. -nr  

## 2016-11-28 ENCOUNTER — Ambulatory Visit: Payer: Medicare Other | Admitting: Family Medicine

## 2016-12-07 ENCOUNTER — Ambulatory Visit (INDEPENDENT_AMBULATORY_CARE_PROVIDER_SITE_OTHER): Payer: Medicare Other

## 2016-12-07 VITALS — BP 151/79 | HR 76 | Temp 97.4°F | Ht 61.0 in | Wt 220.5 lb

## 2016-12-07 DIAGNOSIS — E119 Type 2 diabetes mellitus without complications: Secondary | ICD-10-CM

## 2016-12-07 DIAGNOSIS — Z0000 Encounter for general adult medical examination without abnormal findings: Secondary | ICD-10-CM | POA: Diagnosis not present

## 2016-12-07 NOTE — Progress Notes (Signed)
Subjective:   [name] is a 71 y.o. female who presents for Medicare Annual (Subsequent) preventive examination.  Review of Systems:  N/A Cardiac Risk Factors include: advanced age (>34mn, >>37women);hypertension;diabetes mellitus;dyslipidemia;obesity (BMI >30kg/m2)     Objective:     Vitals: BP (!) 151/79   Pulse 76   Temp (!) 97.4 F (36.3 C) (Oral)   Ht 5' 1"  (1.549 m)   Wt 220 lb 8 oz (100 kg)   BMI 41.66 kg/m   Body mass index is 41.66 kg/m.   Tobacco History  Smoking Status  . Never Smoker  Smokeless Tobacco  . Never Used     Counseling given: Not Answered   Past Medical History:  Diagnosis Date  . Allergy   . Anemia    due to vaginal bleeding  . Arthritis    hands  . Breast cancer ([place] 08/2012   left  . Dental crowns present   . Fibroids   . Headache(784.0)    tension  . History of endometriosis   . Hyperlipidemia   . Hypertension    under control with med., has been on med. x 3 yr.  . OSA (obstructive sleep apnea)   . Postmenopausal bleeding   . S/P radiation therapy 10/20/2012-12/05/2012   left breast 50.4 gray, lumpectomy cavity boosted to 62.4 gray  . Seasonal allergies   . Sleep apnea    uses CPAP nightly  . Urinary urgency   . White coat hypertension    Past Surgical History:  Procedure Laterality Date  . BACK SURGERY  1997   lumbar  . BREAST LUMPECTOMY WITH NEEDLE LOCALIZATION AND AXILLARY SENTINEL LYMPH NODE BX Left 09/03/2012   Procedure: BREAST LUMPECTOMY WITH NEEDLE LOCALIZATION AND AXILLARY SENTINEL LYMPH NODE BX;  Surgeon: BEdward Jolly MD;  [facility]  Service: General;  Laterality: Left;  . CHOLECYSTECTOMY  1995  . COLONOSCOPY W/ POLYPECTOMY  03/20/2007  . DILATION AND CURETTAGE OF UTERUS  2005  . DILATION AND CURETTAGE OF UTERUS  06/22/2011   Procedure: DILATATION AND CURETTAGE;  Surgeon: DAlvino Chapel MD;  [facility]  Service: Gynecology;   Laterality: N/A;  OK PER KEELA FOR 7:15 START  . DORSAL COMPARTMENT RELEASE Right 05/19/2013   Procedure: RELEASE 1ST  DORSAL COMPARTMENT RIGHT (DEQUERVAIN);  Surgeon: RCammie Sickle, MD;  [facility]  Service: Orthopedics;  Laterality: Right;  . EXPLORATORY LAPAROTOMY  06-30-2007   ATTEMPTED HYSTERECTOMY ABORTED DUE TO EXTENSIVE ENDOMETRIOSIS W/ DENSE PELVIC ADHESIONS INVOLVING UTERUS AND LOWER RECTOSIGMOID  . HYSTEROSCOPY W/D&C  05/06/2009   Family History  Problem Relation Age of Onset  . Vision loss Mother   . Hypertension Mother   . Anesthesia problems Mother        post-op N/V  . Heart disease Mother        carotid stenosis s/p CAE  . Osteoporosis Mother   . Depression Father   . Heart disease Sister   . Hypertension Sister   . Hyperlipidemia Sister   . Diabetes Brother   . Hypertension Brother   . Cancer Brother        skin  . Hyperlipidemia Brother   . Stroke Brother   . COPD Daughter   . Cancer Cousin   . Kidney failure Maternal Grandmother   . Heart disease Maternal Grandfather   . Hypertension Other   . Colon cancer Neg Hx    History  Sexual  Activity  . Sexual activity: Yes    Comment: menarche 61, P4, Premarin for short while, Provera x 3 years    Outpatient Encounter Prescriptions as of 12/07/2016  Medication Sig  . albuterol (PROVENTIL HFA;VENTOLIN HFA) 108 (90 Base) MCG/ACT inhaler Inhale 2 puffs into the lungs every 6 (six) hours as needed for wheezing or shortness of breath.  Marland Kitchen aspirin 81 MG tablet Take 81 mg by mouth daily.  . blood glucose meter kit and supplies KIT Test blood sugar once daily. Dx code: E11.9  . cholecalciferol (VITAMIN D) 1000 units tablet Take 1,000 Units by mouth daily.  . Cyanocobalamin (VITAMIN B-12 CR PO) Take 500 mcg by mouth daily.   Marland Kitchen glucose blood (ONE TOUCH ULTRA TEST) test strip USE ONE STRIP TO CHECK GLUCOSE ONCE DAILY  . lisinopril-hydrochlorothiazide (PRINZIDE,ZESTORETIC) 10-12.5 MG tablet Take  1 tablet by mouth daily.  Marland Kitchen lovastatin (MEVACOR) 40 MG tablet Take 1 tablet (40 mg total) by mouth at bedtime.  . metFORMIN (GLUCOPHAGE) 500 MG tablet Take 2 tablets (1,000 mg total) by mouth 2 (two) times daily with a meal. (Patient taking differently: Take 500 mg by mouth 3 (three) times daily. )  . metoprolol succinate (TOPROL-XL) 25 MG 24 hr tablet Take 1 tablet (25 mg total) by mouth daily.  Glory Rosebush DELICA LANCETS 70Y MISC USE ONE  TO CHECK GLUCOSE ONCE DAILY  . tamoxifen (NOLVADEX) 20 MG tablet take 1 tablet by mouth once daily  . vitamin C (ASCORBIC ACID) 500 MG tablet Take 500 mg by mouth daily.   . [DISCONTINUED] azelastine (ASTELIN) 0.1 % nasal spray Place 2 sprays into both nostrils 2 (two) times daily. Use in each nostril as directed   No facility-administered encounter medications on file as of 12/07/2016.     Activities of Daily Living In your present state of health, do you have any difficulty performing the following activities: 12/07/2016  Hearing? N  Vision? N  Difficulty concentrating or making decisions? N  Walking or climbing stairs? N  Dressing or bathing? N  Doing errands, shopping? N  Preparing Food and eating ? N  Using the Toilet? N  In the past six months, have you accidently leaked urine? Y  Comment Patient only has leakage when coughing or sneezing.   Do you have problems with loss of bowel control? N  Managing your Medications? N  Managing your Finances? N  Housekeeping or managing your Housekeeping? N  Some recent data might be hidden    Patient Care Team: [name], MD as PCP - General (Family Medicine) [name], MD (Family Medicine)    Assessment:     Exercise Activities and Dietary recommendations Current Exercise Habits: The patient does not participate in regular exercise at present, Exercise limited by: None identified  Goals    . Weight (lb) < 140 lb (63.5 kg)          Patient states that she would like to get to 140 lbs.  Realizes that this is going to take a long time to achieve, but is willing to try.       Fall Risk Fall Risk  12/07/2016 12/06/2015 09/08/2015 05/31/2015 05/09/2015  Falls in the past year? No No No Yes Yes  Number falls in past yr: - - - - 1   Depression Screen PHQ 2/9 Scores 12/07/2016 08/01/2016 12/06/2015 09/08/2015  PHQ - 2 Score 1 1 0 0     Cognitive Function     6CIT Screen  12/07/2016  What Year? 0 points  What month? 0 points  What time? 0 points  Count back from 20 0 points  Months in reverse 0 points  Repeat phrase 0 points  Total Score 0    Immunization History  Administered Date(s) Administered  . Influenza Split 12/28/2011, 02/23/2013, 02/05/2014  . Influenza,inj,Quad PF,6+ Mos 05/31/2015  . Pneumococcal Conjugate-13 11/16/2013  . Pneumococcal Polysaccharide-23 02/20/2010  . Tdap 11/17/2005  . Zoster 02/16/2010   Screening Tests Health Maintenance  Topic Date Due  . INFLUENZA VACCINE  03/08/2017 (Originally 10/17/2016)  . TETANUS/TDAP  12/08/2026 (Originally 11/20/2015)  . OPHTHALMOLOGY EXAM  01/05/2017  . HEMOGLOBIN A1C  02/01/2017  . FOOT EXAM  08/01/2017  . MAMMOGRAM  12/21/2017  . COLONOSCOPY  07/29/2023  . DEXA SCAN  Completed  . Hepatitis C Screening  Completed  . PNA vac Low Risk Adult  Completed      Plan:   I have personally reviewed and noted the following in the patient's chart:   . Medical and social history . Use of alcohol, tobacco or illicit drugs  . Current medications and supplements . Functional ability and status . Nutritional status . Physical activity . Advanced directives . List of other physicians . Hospitalizations, surgeries, and ER visits in previous 12 months . Vitals . Screenings to include cognitive, depression, and falls . Referrals and appointments  In addition, I have reviewed and discussed with patient certain preventive protocols, quality metrics, and best practice recommendations. A written personalized care plan for  preventive services as well as general preventive health recommendations were provided to patient.     Andrez Grime, LPN  5/33/1740

## 2016-12-07 NOTE — Patient Instructions (Addendum)
[name] , Thank you for taking time to come for your Medicare Wellness Visit. I appreciate your ongoing commitment to your health goals. Please review the following plan we discussed and let me know if I can assist you in the future.   Screening recommendations/referrals: Colonoscopy: up to date, next due 07/29/2023 Mammogram: up to date, next due 12/21/2016, due to history Bone Density: up to date, 11/30/16 Recommended yearly ophthalmology/optometry visit for glaucoma screening and checkup Recommended yearly dental visit for hygiene and checkup  Vaccinations: Influenza vaccine: declined, Will get this in November  Pneumococcal vaccine: up to date Tdap vaccine: due, declined due to insurance Shingles vaccine: up to date     Advanced directives: Please bring a copy of your POA (Power of [place]) and/or Living Will to your next appointment.    Conditions/risks identified: Try to work on losing weight to improve your overall health  Next appointment: 12/11/16 @ 9:20 am with Dr. Tamala Julian    Preventive Care 65 Years and Older, Female Preventive care refers to lifestyle choices and visits with your health care provider that can promote health and wellness. What does preventive care include?  A yearly physical exam. This is also called an annual well check.  Dental exams once or twice a year.  Routine eye exams. Ask your health care provider how often you should have your eyes checked.  Personal lifestyle choices, including:  Daily care of your teeth and gums.  Regular physical activity.  Eating a healthy diet.  Avoiding tobacco and drug use.  Limiting alcohol use.  Practicing safe sex.  Taking low-dose aspirin every day.  Taking vitamin and mineral supplements as recommended by your health care provider. What happens during an annual well check? The services and screenings done by your health care provider during your annual well check will depend on your age, overall  health, lifestyle risk factors, and family history of disease. Counseling  Your health care provider may ask you questions about your:  Alcohol use.  Tobacco use.  Drug use.  Emotional well-being.  Home and relationship well-being.  Sexual activity.  Eating habits.  History of falls.  Memory and ability to understand (cognition).  Work and work Statistician.  Reproductive health. Screening  You may have the following tests or measurements:  Height, weight, and BMI.  Blood pressure.  Lipid and cholesterol levels. These may be checked every 5 years, or more frequently if you are over 40 years old.  Skin check.  Lung cancer screening. You may have this screening every year starting at age 80 if you have a 30-pack-year history of smoking and currently smoke or have quit within the past 15 years.  Fecal occult blood test (FOBT) of the stool. You may have this test every year starting at age 13.  Flexible sigmoidoscopy or colonoscopy. You may have a sigmoidoscopy every 5 years or a colonoscopy every 10 years starting at age 106.  Hepatitis C blood test.  Hepatitis B blood test.  Sexually transmitted disease (STD) testing.  Diabetes screening. This is done by checking your blood sugar (glucose) after you have not eaten for a while (fasting). You may have this done every 1-3 years.  Bone density scan. This is done to screen for osteoporosis. You may have this done starting at age 32.  Mammogram. This may be done every 1-2 years. Talk to your health care provider about how often you should have regular mammograms. Talk with your health care provider about your test  results, treatment options, and if necessary, the need for more tests. Vaccines  Your health care provider may recommend certain vaccines, such as:  Influenza vaccine. This is recommended every year.  Tetanus, diphtheria, and acellular pertussis (Tdap, Td) vaccine. You may need a Td booster every 10  years.  Zoster vaccine. You may need this after age 53.  Pneumococcal 13-valent conjugate (PCV13) vaccine. One dose is recommended after age 15.  Pneumococcal polysaccharide (PPSV23) vaccine. One dose is recommended after age 82. Talk to your health care provider about which screenings and vaccines you need and how often you need them. This information is not intended to replace advice given to you by your health care provider. Make sure you discuss any questions you have with your health care provider. Document Released: 04/01/2015 Document Revised: 11/23/2015 Document Reviewed: 01/04/2015 Elsevier Interactive Patient Education  2017 [place] Prevention in the Home Falls can cause injuries. They can happen to people of all ages. There are many things you can do to make your home safe and to help prevent falls. What can I do on the outside of my home?  Regularly fix the edges of walkways and driveways and fix any cracks.  Remove anything that might make you trip as you walk through a door, such as a raised step or threshold.  Trim any bushes or trees on the path to your home.  Use bright outdoor lighting.  Clear any walking paths of anything that might make someone trip, such as rocks or tools.  Regularly check to see if handrails are loose or broken. Make sure that both sides of any steps have handrails.  Any raised decks and porches should have guardrails on the edges.  Have any leaves, snow, or ice cleared regularly.  Use sand or salt on walking paths during winter.  Clean up any spills in your garage right away. This includes oil or grease spills. What can I do in the bathroom?  Use night lights.  Install grab bars by the toilet and in the tub and shower. Do not use towel bars as grab bars.  Use non-skid mats or decals in the tub or shower.  If you need to sit down in the shower, use a plastic, non-slip stool.  Keep the floor dry. Clean up any water that  spills on the floor as soon as it happens.  Remove soap buildup in the tub or shower regularly.  Attach bath mats securely with double-sided non-slip rug tape.  Do not have throw rugs and other things on the floor that can make you trip. What can I do in the bedroom?  Use night lights.  Make sure that you have a light by your bed that is easy to reach.  Do not use any sheets or blankets that are too big for your bed. They should not hang down onto the floor.  Have a firm chair that has side arms. You can use this for support while you get dressed.  Do not have throw rugs and other things on the floor that can make you trip. What can I do in the kitchen?  Clean up any spills right away.  Avoid walking on wet floors.  Keep items that you use a lot in easy-to-reach places.  If you need to reach something above you, use a strong step stool that has a grab bar.  Keep electrical cords out of the way.  Do not use floor polish or wax that makes  floors slippery. If you must use wax, use non-skid floor wax.  Do not have throw rugs and other things on the floor that can make you trip. What can I do with my stairs?  Do not leave any items on the stairs.  Make sure that there are handrails on both sides of the stairs and use them. Fix handrails that are broken or loose. Make sure that handrails are as long as the stairways.  Check any carpeting to make sure that it is firmly attached to the stairs. Fix any carpet that is loose or worn.  Avoid having throw rugs at the top or bottom of the stairs. If you do have throw rugs, attach them to the floor with carpet tape.  Make sure that you have a light switch at the top of the stairs and the bottom of the stairs. If you do not have them, ask someone to add them for you. What else can I do to help prevent falls?  Wear shoes that:  Do not have high heels.  Have rubber bottoms.  Are comfortable and fit you well.  Are closed at the  toe. Do not wear sandals.  If you use a stepladder:  Make sure that it is fully opened. Do not climb a closed stepladder.  Make sure that both sides of the stepladder are locked into place.  Ask someone to hold it for you, if possible.  Clearly mark and make sure that you can see:  Any grab bars or handrails.  First and last steps.  Where the edge of each step is.  Use tools that help you move around (mobility aids) if they are needed. These include:  Canes.  Walkers.  Scooters.  Crutches.  Turn on the lights when you go into a dark area. Replace any light bulbs as soon as they burn out.  Set up your furniture so you have a clear path. Avoid moving your furniture around.  If any of your floors are uneven, fix them.  If there are any pets around you, be aware of where they are.  Review your medicines with your doctor. Some medicines can make you feel dizzy. This can increase your chance of falling. Ask your doctor what other things that you can do to help prevent falls. This information is not intended to replace advice given to you by your health care provider. Make sure you discuss any questions you have with your health care provider. Document Released: 12/30/2008 Document Revised: 08/11/2015 Document Reviewed: 04/09/2014 Elsevier Interactive Patient Education  2017 [employer].

## 2016-12-08 LAB — URINALYSIS, COMPLETE
BILIRUBIN UA: NEGATIVE
Glucose, UA: NEGATIVE
KETONES UA: NEGATIVE
LEUKOCYTES UA: NEGATIVE
NITRITE UA: NEGATIVE
PH UA: 6 (ref 5.0–7.5)
Protein, UA: NEGATIVE
RBC UA: NEGATIVE
Specific Gravity, UA: 1.007 (ref 1.005–1.030)
UUROB: 0.2 mg/dL (ref 0.2–1.0)

## 2016-12-08 LAB — MICROSCOPIC EXAMINATION: CASTS: NONE SEEN /LPF

## 2016-12-08 LAB — HEMOGLOBIN A1C
ESTIMATED AVERAGE GLUCOSE: 134 mg/dL
HEMOGLOBIN A1C: 6.3 % — AB (ref 4.8–5.6)

## 2016-12-08 LAB — MICROALBUMIN, URINE: Microalbumin, Urine: <3 ug/mL

## 2016-12-11 ENCOUNTER — Encounter: Payer: Self-pay | Admitting: Family Medicine

## 2016-12-11 ENCOUNTER — Ambulatory Visit (INDEPENDENT_AMBULATORY_CARE_PROVIDER_SITE_OTHER): Payer: Medicare Other | Admitting: Family Medicine

## 2016-12-11 VITALS — BP 140/82 | HR 72 | Temp 98.0°F | Resp 16 | Ht 61.42 in | Wt 221.0 lb

## 2016-12-11 DIAGNOSIS — K219 Gastro-esophageal reflux disease without esophagitis: Secondary | ICD-10-CM | POA: Diagnosis not present

## 2016-12-11 DIAGNOSIS — Z9989 Dependence on other enabling machines and devices: Secondary | ICD-10-CM | POA: Diagnosis not present

## 2016-12-11 DIAGNOSIS — J301 Allergic rhinitis due to pollen: Secondary | ICD-10-CM

## 2016-12-11 DIAGNOSIS — I10 Essential (primary) hypertension: Secondary | ICD-10-CM

## 2016-12-11 DIAGNOSIS — Z170 Estrogen receptor positive status [ER+]: Secondary | ICD-10-CM

## 2016-12-11 DIAGNOSIS — C50112 Malignant neoplasm of central portion of left female breast: Secondary | ICD-10-CM | POA: Diagnosis not present

## 2016-12-11 DIAGNOSIS — F4320 Adjustment disorder, unspecified: Secondary | ICD-10-CM

## 2016-12-11 DIAGNOSIS — R05 Cough: Secondary | ICD-10-CM

## 2016-12-11 DIAGNOSIS — G4733 Obstructive sleep apnea (adult) (pediatric): Secondary | ICD-10-CM | POA: Diagnosis not present

## 2016-12-11 DIAGNOSIS — F4321 Adjustment disorder with depressed mood: Secondary | ICD-10-CM

## 2016-12-11 DIAGNOSIS — M48062 Spinal stenosis, lumbar region with neurogenic claudication: Secondary | ICD-10-CM | POA: Diagnosis not present

## 2016-12-11 DIAGNOSIS — E7800 Pure hypercholesterolemia, unspecified: Secondary | ICD-10-CM

## 2016-12-11 DIAGNOSIS — R059 Cough, unspecified: Secondary | ICD-10-CM

## 2016-12-11 DIAGNOSIS — E119 Type 2 diabetes mellitus without complications: Secondary | ICD-10-CM

## 2016-12-11 DIAGNOSIS — E669 Obesity, unspecified: Secondary | ICD-10-CM

## 2016-12-11 MED ORDER — ZOSTER VAC RECOMB ADJUVANTED 50 MCG/0.5ML IM SUSR: 0.5000 mL | Freq: Once | INTRAMUSCULAR | 1 refills | Status: AC

## 2016-12-11 MED ORDER — OMEPRAZOLE 20 MG PO CPDR
20.0000 mg | DELAYED_RELEASE_CAPSULE | Freq: Every day | ORAL | 1 refills | Status: DC
Start: 2016-12-11 — End: 2017-04-15

## 2016-12-11 NOTE — Progress Notes (Signed)
   Subjective:    Patient ID: [name], female    DOB: 1945/04/03, 71 y.o.   MRN: [id number]  HPI    Review of Systems  HENT: Positive for rhinorrhea.   Eyes: Positive for discharge.  Allergic/Immunologic: Positive for environmental allergies.       Objective:   Physical Exam        Assessment & Plan:

## 2016-12-11 NOTE — Progress Notes (Signed)
Subjective:    Patient ID: [name], female    DOB: 21-Aug-1945, 71 y.o.   MRN: [id number]  12/11/2016  Annual Exam; Diabetes; Hypertension; and Hyperlipidemia   HPI This 71 y.o. female presents for Complete Physical Examination and chronic medical conditions.   Last physical:  12/06/2015 Pap smear:  2018; [name] just did one.  Vaginal itching.  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 Mammogram:  12/22/2015 Colonoscopy:  07/28/2013 Bone density:  2018; yearly bone density scan; osteopenia. Eye exam:  Yokum;Grote. Dental exam:  Every six months.  DMII: Patient reports good compliance with medication, good tolerance to medication, and good symptom control.  HgbA1c 6.3 last week. 108 this week.  Hypertension: .Patient reports good compliance with medication, good tolerance to medication, and good symptom control.  BP 116/72.  Hypercholesterolemia: Patient reports good compliance with medication, good tolerance to medication, and good symptom control.    Breast cancer; followed annually with oncology.  Vaginal itching: 11/06/16.   Cough: continues.  Switched Lisinopril without improvement so started back taking it.  Taking Zyrtec and AStelin daily without improvement.  Some GERD symptoms now which is new.   OSA: Patient reports good compliance with medication, good tolerance to medication, and good symptom control.    BP Readings from Last 3 Encounters:  12/11/16 140/82  12/07/16 (!) 151/79  08/01/16 (!) 150/70   Wt Readings from Last 3 Encounters:  12/11/16 221 lb (100.2 kg)  12/07/16 220 lb 8 oz (100 kg)  08/01/16 217 lb (98.4 kg)   Immunization History  Administered Date(s) Administered  . Influenza Split 12/28/2011, 02/23/2013, 02/05/2014  . Influenza,inj,Quad PF,6+ Mos 05/31/2015  . Pneumococcal Conjugate-13 11/16/2013  . Pneumococcal Polysaccharide-23 02/20/2010  . Tdap 11/17/2005  . Zoster 02/16/2010    Review of Systems  Constitutional: Negative for activity  change, appetite change, chills, diaphoresis, fatigue, fever and unexpected weight change.  HENT: Negative for congestion, dental problem, drooling, ear discharge, ear pain, facial swelling, hearing loss, mouth sores, nosebleeds, postnasal drip, rhinorrhea, sinus pressure, sneezing, sore throat, tinnitus, trouble swallowing and voice change.   Eyes: Negative for photophobia, pain, discharge, redness, itching and visual disturbance.  Respiratory: Positive for cough. Negative for apnea, choking, chest tightness, shortness of breath, wheezing and stridor.   Cardiovascular: Negative for chest pain, palpitations and leg swelling.  Gastrointestinal: Negative for abdominal distention, abdominal pain, anal bleeding, blood in stool, constipation, diarrhea, nausea, rectal pain and vomiting.  Endocrine: Negative for cold intolerance, heat intolerance, polydipsia, polyphagia and polyuria.  Genitourinary: Negative for decreased urine volume, difficulty urinating, dyspareunia, dysuria, enuresis, flank pain, frequency, genital sores, hematuria, menstrual problem, pelvic pain, urgency, vaginal bleeding, vaginal discharge and vaginal pain.       Nocturia x 0.  No urinary leakage.  Musculoskeletal: Negative for arthralgias, back pain, gait problem, joint swelling, myalgias, neck pain and neck stiffness.  Skin: Negative for color change, pallor, rash and wound.  Allergic/Immunologic: Negative for environmental allergies, food allergies and immunocompromised state.  Neurological: Negative for dizziness, tremors, seizures, syncope, facial asymmetry, speech difficulty, weakness, light-headedness, numbness and headaches.  Hematological: Negative for adenopathy. Does not bruise/bleed easily.  Psychiatric/Behavioral: Positive for dysphoric mood. Negative for agitation, behavioral problems, confusion, decreased concentration, hallucinations, self-injury, sleep disturbance and suicidal ideas. The patient is not nervous/anxious  and is not hyperactive.        Bedtime 1100; wakes up 700 or 800.    Past Medical History:  Diagnosis Date  . Allergy   . Anemia  due to vaginal bleeding  . Arthritis    hands  . Breast cancer ([place]) 08/2012   left  . Dental crowns present   . Fibroids   . Headache(784.0)    tension  . History of endometriosis   . Hyperlipidemia   . Hypertension    under control with med., has been on med. x 3 yr.  . OSA (obstructive sleep apnea)   . Postmenopausal bleeding   . S/P radiation therapy 10/20/2012-12/05/2012   left breast 50.4 gray, lumpectomy cavity boosted to 62.4 gray  . Seasonal allergies   . Sleep apnea    uses CPAP nightly  . Urinary urgency   . White coat hypertension    Past Surgical History:  Procedure Laterality Date  . BACK SURGERY  1997   lumbar  . BREAST LUMPECTOMY WITH NEEDLE LOCALIZATION AND AXILLARY SENTINEL LYMPH NODE BX Left 09/03/2012   Procedure: BREAST LUMPECTOMY WITH NEEDLE LOCALIZATION AND AXILLARY SENTINEL LYMPH NODE BX;  Surgeon: Edward Jolly, MD;  Location: [facility];  Service: General;  Laterality: Left;  . CHOLECYSTECTOMY  1995  . COLONOSCOPY W/ POLYPECTOMY  03/20/2007  . DILATION AND CURETTAGE OF UTERUS  2005  . DILATION AND CURETTAGE OF UTERUS  06/22/2011   Procedure: DILATATION AND CURETTAGE;  Surgeon: Alvino Chapel, MD;  Location: [facility];  Service: Gynecology;  Laterality: N/A;  OK PER KEELA FOR 7:15 START  . DORSAL COMPARTMENT RELEASE Right 05/19/2013   Procedure: RELEASE 1ST  DORSAL COMPARTMENT RIGHT (DEQUERVAIN);  Surgeon: Cammie Sickle., MD;  Location: [facility];  Service: Orthopedics;  Laterality: Right;  . EXPLORATORY LAPAROTOMY  06-30-2007   ATTEMPTED HYSTERECTOMY ABORTED DUE TO EXTENSIVE ENDOMETRIOSIS W/ DENSE PELVIC ADHESIONS INVOLVING UTERUS AND LOWER RECTOSIGMOID  . HYSTEROSCOPY W/D&C  05/06/2009   Allergies  Allergen Reactions  . Adhesive [Tape] Rash  . Latex  Itching and Other (See Comments)    Causes blisters    Social History   Social History  . Marital status: Married    Spouse name: [name]  . Number of children: 4  . Years of education: College   Occupational History  . retired    Social History Main Topics  . Smoking status: Never Smoker  . Smokeless tobacco: Never Used  . Alcohol use No  . Drug use: No  . Sexual activity: Yes     Comment: menarche 30, P4, Premarin for short while, Provera x 3 years   Other Topics Concern  . Not on file   Social History Narrative   Marital status: married x 45  years; happily married; no abuse.      Children: 4 children (2 sons living; 1 son died in 3164 at age 45years old, 1 daughter); six grandchildren; no gg.      Lives:  With husband.      Employed: retired in 2011; [employer].      Tobacco: none       Alcohol:  None      Drugs: none      Exercise:  Rowing 3 days per week.      Seatbelt:  100%      Sunscreen:  Face SPF 15.      Guns:  Loaded mostly secured.      ADLs:  Drives minimally; no assistant devices; independent with all ADLs.       Advanced Directives: YES; FULL CODE but no prolonged measures.  Her nocturia is improved under CPAP use at 10 cm water- AHI is now 0.8 and from 30 at baseline. CMS compliance  6 hours 40 minutes - sleeps on the side, 08-10-11 .FFM - likes it.    . Flonase used prn.     Caffeine Use: 1 cup daily   Family History  Problem Relation Age of Onset  . Vision loss Mother   . Hypertension Mother   . Anesthesia problems Mother        post-op N/V  . Heart disease Mother        carotid stenosis s/p CAE  . Osteoporosis Mother   . Depression Father   . Heart disease Sister 18       arrhythmia  . Hypertension Sister   . Hyperlipidemia Sister   . Diabetes Brother   . Hypertension Brother   . Cancer Brother        skin  . Hyperlipidemia Brother   . Stroke Brother   . COPD Daughter   . Cancer Cousin   . Kidney failure  Maternal Grandmother   . Heart disease Maternal Grandfather   . Hypertension Other   . Colon cancer Neg Hx        Objective:    BP 140/82   Pulse 72   Temp 98 F (36.7 C) (Oral)   Resp 16   Ht 5' 1.42" (1.56 m)   Wt 221 lb (100.2 kg)   SpO2 96%   BMI 41.19 kg/m  Physical Exam  Constitutional: She is oriented to person, place, and time. She appears well-developed and well-nourished. No distress.  HENT:  Head: Normocephalic and atraumatic.  Right Ear: External ear normal.  Left Ear: External ear normal.  Nose: Nose normal.  Mouth/Throat: Oropharynx is clear and moist.  Eyes: Pupils are equal, round, and reactive to light. Conjunctivae and EOM are normal.  Neck: Normal range of motion and full passive range of motion without pain. Neck supple. No JVD present. Carotid bruit is not present. No thyromegaly present.  Cardiovascular: Normal rate, regular rhythm and normal heart sounds.  Exam reveals no gallop and no friction rub.   No murmur heard. Pulmonary/Chest: Effort normal and breath sounds normal. She has no wheezes. She has no rales.  Abdominal: Soft. Bowel sounds are normal. She exhibits no distension and no mass. There is no tenderness. There is no rebound and no guarding.  Musculoskeletal:       Right shoulder: Normal.       Left shoulder: Normal.       Cervical back: Normal.  Lymphadenopathy:    She has no cervical adenopathy.  Neurological: She is alert and oriented to person, place, and time. She has normal reflexes. No cranial nerve deficit. She exhibits normal muscle tone. Coordination normal.  Skin: Skin is warm and dry. No rash noted. She is not diaphoretic. No erythema. No pallor.  Psychiatric: She has a normal mood and affect. Her behavior is normal. Judgment and thought content normal.  Nursing note and vitals reviewed.   No results found. Depression screen [facility] 2/9 12/11/2016 12/07/2016 08/01/2016 12/06/2015 09/08/2015  Decreased Interest 0 0 0 0 0  Down,  Depressed, Hopeless 0 1 1 0 0  PHQ - 2 Score 0 1 1 0 0   Fall Risk  12/11/2016 12/07/2016 12/06/2015 09/08/2015 05/31/2015  Falls in the past year? No No No No Yes  Number falls in past yr: - - - - -  Assessment & Plan:   1. Type 2 diabetes mellitus without complication, without long-term current use of insulin ([place])   2. Essential hypertension   3. Seasonal allergic rhinitis due to pollen   4. Obstructive sleep apnea on CPAP   5. Malignant neoplasm of central portion of left breast in female, estrogen receptor positive ([place])   6. Pure hypercholesterolemia   7. Spinal stenosis of lumbar region with neurogenic claudication   8. Obesity (BMI 30-39.9)   9. Cough   10. Gastroesophageal reflux disease without esophagitis   11. Grief reaction    -moderately controlled DMII and hypertension; obtain labs for chronic disease management; continue current medications. -OSA and allergic rhinitis well controlled. -followed by oncology annually for LEFT breast cancer. -recommend weight loss, exercise for 30-60 minutes five days per week; recommend 1200 kcal restriction per day with a minimum of 60 grams of protein per day. -suffering with chronic cough; no improvement with HOLDING ACEI or with treating allergic rhinitis; having GERD symptoms currently; treat with Prilosec for three months.  S/p CXR in 08/2015 for chronic cough and negative. -continues to grieve the loss of son this year.     Orders Placed This Encounter  Procedures  . CBC with Differential/Platelet  . Comprehensive metabolic panel    Order Specific Question:   Has the patient fasted?    Answer:   Yes  . TSH  . Lipid panel    Order Specific Question:   Has the patient fasted?    Answer:   Yes   Meds ordered this encounter  Medications  . Zoster Vac Recomb Adjuvanted [facility]) injection    Sig: Inject 0.5 mLs into the muscle once.    Dispense:  0.5 mL    Refill:  1  . omeprazole (PRILOSEC) 20 MG capsule    Sig:  Take 1 capsule (20 mg total) by mouth daily.    Dispense:  90 capsule    Refill:  1    Return in about 4 months (around 04/12/2017) for recheck diabetes, high blood pressure, cough.   [place] Elayne Guerin, M.D. Primary Care at [facility] [address] [place], [place]  [address] [phone] phone [phone] fax

## 2016-12-11 NOTE — Patient Instructions (Addendum)
   IF you received an x-ray today, you will receive an invoice from [facility]. Please contact [facility] at [phone] with questions or concerns regarding your invoice.   IF you received labwork today, you will receive an invoice from [facility]. Please contact [facility] at [phone] with questions or concerns regarding your invoice.   Our billing staff will not be able to assist you with questions regarding bills from these companies.  You will be contacted with the lab results as soon as they are available. The fastest way to get your results is to activate your My Chart account. Instructions are located on the last page of this paperwork. If you have not heard from us regarding the results in 2 weeks, please contact this office.      Preventive Care 72 Years and Older, Female Preventive care refers to lifestyle choices and visits with your health care provider that can promote health and wellness. What does preventive care include?  A yearly physical exam. This is also called an annual well check.  Dental exams once or twice a year.  Routine eye exams. Ask your health care provider how often you should have your eyes checked.  Personal lifestyle choices, including: ? Daily care of your teeth and gums. ? Regular physical activity. ? Eating a healthy diet. ? Avoiding tobacco and drug use. ? Limiting alcohol use. ? Practicing safe sex. ? Taking low-dose aspirin every day. ? Taking vitamin and mineral supplements as recommended by your health care provider. What happens during an annual well check? The services and screenings done by your health care provider during your annual well check will depend on your age, overall health, lifestyle risk factors, and family history of disease. Counseling Your health care provider may ask you questions about your:  Alcohol use.  Tobacco use.  Drug use.  Emotional well-being.  Home and relationship  well-being.  Sexual activity.  Eating habits.  History of falls.  Memory and ability to understand (cognition).  Work and work environment.  Reproductive health.  Screening You may have the following tests or measurements:  Height, weight, and BMI.  Blood pressure.  Lipid and cholesterol levels. These may be checked every 5 years, or more frequently if you are over 50 years old.  Skin check.  Lung cancer screening. You may have this screening every year starting at age 55 if you have a 30-pack-year history of smoking and currently smoke or have quit within the past 15 years.  Fecal occult blood test (FOBT) of the stool. You may have this test every year starting at age 50.  Flexible sigmoidoscopy or colonoscopy. You may have a sigmoidoscopy every 5 years or a colonoscopy every 10 years starting at age 50.  Hepatitis C blood test.  Hepatitis B blood test.  Sexually transmitted disease (STD) testing.  Diabetes screening. This is done by checking your blood sugar (glucose) after you have not eaten for a while (fasting). You may have this done every 1-3 years.  Bone density scan. This is done to screen for osteoporosis. You may have this done starting at age 72.  Mammogram. This may be done every 1-2 years. Talk to your health care provider about how often you should have regular mammograms.  Talk with your health care provider about your test results, treatment options, and if necessary, the need for more tests. Vaccines Your health care provider may recommend certain vaccines, such as:  Influenza vaccine. This is recommended every year.    Tetanus, diphtheria, and acellular pertussis (Tdap, Td) vaccine. You may need a Td booster every 10 years.  Varicella vaccine. You may need this if you have not been vaccinated.  Zoster vaccine. You may need this after age 72.  Measles, mumps, and rubella (MMR) vaccine. You may need at least one dose of MMR if you were born in  1958 or later. You may also need a second dose.  Pneumococcal 13-valent conjugate (PCV13) vaccine. One dose is recommended after age 72.  Pneumococcal polysaccharide (PPSV23) vaccine. One dose is recommended after age 72.  Meningococcal vaccine. You may need this if you have certain conditions.  Hepatitis A vaccine. You may need this if you have certain conditions or if you travel or work in places where you may be exposed to hepatitis A.  Hepatitis B vaccine. You may need this if you have certain conditions or if you travel or work in places where you may be exposed to hepatitis B.  Haemophilus influenzae type b (Hib) vaccine. You may need this if you have certain conditions.  Talk to your health care provider about which screenings and vaccines you need and how often you need them. This information is not intended to replace advice given to you by your health care provider. Make sure you discuss any questions you have with your health care provider. Document Released: 04/01/2015 Document Revised: 11/23/2015 Document Reviewed: 01/04/2015 Elsevier Interactive Patient Education  2017 [employer]  

## 2016-12-12 LAB — COMPREHENSIVE METABOLIC PANEL
A/G RATIO: 1.4 (ref 1.2–2.2)
ALT: 16 IU/L (ref 0–32)
AST: 20 IU/L (ref 0–40)
Albumin: 4.2 g/dL (ref 3.5–4.8)
Alkaline Phosphatase: 80 IU/L (ref 39–117)
BUN/Creatinine Ratio: 15 (ref 12–28)
BUN: 12 mg/dL (ref 8–27)
Bilirubin Total: 0.3 mg/dL (ref 0.0–1.2)
CALCIUM: 10.3 mg/dL (ref 8.7–10.3)
CO2: 25 mmol/L (ref 20–29)
CREATININE: 0.79 mg/dL (ref 0.57–1.00)
Chloride: 96 mmol/L (ref 96–106)
GFR, EST AFRICAN AMERICAN: 86 mL/min/{1.73_m2} (ref >59)
GFR, EST NON AFRICAN AMERICAN: 75 mL/min/{1.73_m2} (ref >59)
GLOBULIN, TOTAL: 3 g/dL (ref 1.5–4.5)
Glucose: 138 mg/dL — ABNORMAL HIGH (ref 65–99)
POTASSIUM: 4.4 mmol/L (ref 3.5–5.2)
SODIUM: 140 mmol/L (ref 134–144)
Total Protein: 7.2 g/dL (ref 6.0–8.5)

## 2016-12-12 LAB — LIPID PANEL
CHOL/HDL RATIO: 4.3 ratio (ref 0.0–4.4)
Cholesterol, Total: 211 mg/dL — ABNORMAL HIGH (ref 100–199)
HDL: 49 mg/dL (ref >39)
LDL CALC: 130 mg/dL — AB (ref 0–99)
Triglycerides: 161 mg/dL — ABNORMAL HIGH (ref 0–149)
VLDL CHOLESTEROL CAL: 32 mg/dL (ref 5–40)

## 2016-12-12 LAB — CBC WITH DIFFERENTIAL/PLATELET
BASOS: 0 %
Basophils Absolute: 0 10*3/uL (ref 0.0–0.2)
EOS (ABSOLUTE): 0.2 10*3/uL (ref 0.0–0.4)
EOS: 4 %
HEMATOCRIT: 38.6 % (ref 34.0–46.6)
HEMOGLOBIN: 12.8 g/dL (ref 11.1–15.9)
IMMATURE GRANULOCYTES: 0 %
Immature Grans (Abs): 0 10*3/uL (ref 0.0–0.1)
LYMPHS ABS: 1.8 10*3/uL (ref 0.7–3.1)
Lymphs: 28 %
MCH: 29.6 pg (ref 26.6–33.0)
MCHC: 33.2 g/dL (ref 31.5–35.7)
MCV: 89 fL (ref 79–97)
MONOCYTES: 7 %
MONOS ABS: 0.4 10*3/uL (ref 0.1–0.9)
Neutrophils Absolute: 3.9 10*3/uL (ref 1.4–7.0)
Neutrophils: 61 %
Platelets: 263 10*3/uL (ref 150–379)
RBC: 4.33 x10E6/uL (ref 3.77–5.28)
RDW: 13 % (ref 12.3–15.4)
WBC: 6.4 10*3/uL (ref 3.4–10.8)

## 2016-12-12 LAB — TSH: TSH: 1.57 u[IU]/mL (ref 0.450–4.500)

## 2016-12-25 DIAGNOSIS — Z853 Personal history of malignant neoplasm of breast: Secondary | ICD-10-CM | POA: Diagnosis not present

## 2016-12-25 DIAGNOSIS — R928 Other abnormal and inconclusive findings on diagnostic imaging of breast: Secondary | ICD-10-CM | POA: Diagnosis not present

## 2016-12-25 LAB — HM MAMMOGRAPHY

## 2017-01-07 DIAGNOSIS — E119 Type 2 diabetes mellitus without complications: Secondary | ICD-10-CM | POA: Diagnosis not present

## 2017-01-07 DIAGNOSIS — H40013 Open angle with borderline findings, low risk, bilateral: Secondary | ICD-10-CM | POA: Diagnosis not present

## 2017-01-07 DIAGNOSIS — H2513 Age-related nuclear cataract, bilateral: Secondary | ICD-10-CM | POA: Diagnosis not present

## 2017-02-12 DIAGNOSIS — Z23 Encounter for immunization: Secondary | ICD-10-CM | POA: Diagnosis not present

## 2017-02-19 DIAGNOSIS — I471 Supraventricular tachycardia: Secondary | ICD-10-CM | POA: Diagnosis not present

## 2017-02-19 DIAGNOSIS — I493 Ventricular premature depolarization: Secondary | ICD-10-CM | POA: Diagnosis not present

## 2017-02-19 DIAGNOSIS — I10 Essential (primary) hypertension: Secondary | ICD-10-CM | POA: Diagnosis not present

## 2017-02-19 DIAGNOSIS — E7800 Pure hypercholesterolemia, unspecified: Secondary | ICD-10-CM | POA: Diagnosis not present

## 2017-03-25 ENCOUNTER — Other Ambulatory Visit: Payer: Self-pay | Admitting: Hematology and Oncology

## 2017-03-25 DIAGNOSIS — C50112 Malignant neoplasm of central portion of left female breast: Secondary | ICD-10-CM

## 2017-04-15 ENCOUNTER — Ambulatory Visit (INDEPENDENT_AMBULATORY_CARE_PROVIDER_SITE_OTHER): Payer: Medicare Other | Admitting: Family Medicine

## 2017-04-15 ENCOUNTER — Encounter: Payer: Self-pay | Admitting: Family Medicine

## 2017-04-15 ENCOUNTER — Other Ambulatory Visit: Payer: Self-pay

## 2017-04-15 VITALS — BP 142/82 | HR 86 | Temp 97.8°F | Resp 16 | Ht 61.81 in | Wt 219.0 lb

## 2017-04-15 DIAGNOSIS — G4733 Obstructive sleep apnea (adult) (pediatric): Secondary | ICD-10-CM

## 2017-04-15 DIAGNOSIS — M85851 Other specified disorders of bone density and structure, right thigh: Secondary | ICD-10-CM | POA: Diagnosis not present

## 2017-04-15 DIAGNOSIS — I10 Essential (primary) hypertension: Secondary | ICD-10-CM

## 2017-04-15 DIAGNOSIS — Z170 Estrogen receptor positive status [ER+]: Secondary | ICD-10-CM | POA: Diagnosis not present

## 2017-04-15 DIAGNOSIS — Z23 Encounter for immunization: Secondary | ICD-10-CM | POA: Diagnosis not present

## 2017-04-15 DIAGNOSIS — Z9989 Dependence on other enabling machines and devices: Secondary | ICD-10-CM | POA: Diagnosis not present

## 2017-04-15 DIAGNOSIS — C50112 Malignant neoplasm of central portion of left female breast: Secondary | ICD-10-CM | POA: Diagnosis not present

## 2017-04-15 DIAGNOSIS — E119 Type 2 diabetes mellitus without complications: Secondary | ICD-10-CM

## 2017-04-15 DIAGNOSIS — J301 Allergic rhinitis due to pollen: Secondary | ICD-10-CM | POA: Diagnosis not present

## 2017-04-15 DIAGNOSIS — E7800 Pure hypercholesterolemia, unspecified: Secondary | ICD-10-CM

## 2017-04-15 LAB — GLUCOSE, POCT (MANUAL RESULT ENTRY): POC Glucose: 140 mg/dl — AB (ref 70–99)

## 2017-04-15 LAB — POCT GLYCOSYLATED HEMOGLOBIN (HGB A1C): HEMOGLOBIN A1C: 6.8

## 2017-04-15 MED ORDER — LISINOPRIL-HYDROCHLOROTHIAZIDE 10-12.5 MG PO TABS: 1.0000 | ORAL_TABLET | Freq: Every day | ORAL | 1 refills | Status: DC

## 2017-04-15 MED ORDER — METOPROLOL SUCCINATE ER 25 MG PO TB24: 25.0000 mg | ORAL_TABLET | Freq: Every day | ORAL | 1 refills | Status: DC

## 2017-04-15 NOTE — Progress Notes (Signed)
Subjective:    Patient ID: [name], female    DOB: [birth year], 72 y.o.   MRN: [id number]  04/15/2017  Diabetes (4 month follow-up ); Hyperlipidemia; and Hypertension    HPI This 72 y.o. female presents for four month follow-up of DMII, hypertension, hypercholesterolemia, anxiety/depression, and GERD.  Management changes made at last visit include: -moderately controlled DMII and hypertension; obtain labs for chronic disease management; continue current medications. -OSA and allergic rhinitis well controlled. -followed by oncology annually for LEFT breast cancer. -recommend weight loss, exercise for 30-60 minutes five days per week; recommend 1200 kcal restriction per day with a minimum of 60 grams of protein per day. -suffering with chronic cough; no improvement with HOLDING ACEI or with treating allergic rhinitis; having GERD symptoms currently; treat with Prilosec for three months.  S/p CXR in 08/2015 for chronic cough and negative. -continues to grieve the loss of son this year.   Cough: started Prilosec at last viit; took Prilosec for three months.  No improvement.  Cough has worsened.  Worse in the mornings upon awakening.  Throat clearing.  PND; sputum production; clear.  More talking, more coughing; hoarseness.  REFUSES NASAL SPRAYS.  Previously took Cetirizine; went back to Loratadine; just restarted that back yesterday.  Feels really congested; sneezing a lot.    Tamoxifen is $54.   Not checking sugars.  Glucometers all broke; has four.   Only taking one Metformin every morning.   Not taking Lovastatin. Saw Carolynn Sayers October 2018.  Fatigue: no energy; not sleeping well for two nights.  Denies hypersomnolence.  Frequently wakes up at 4 AM and unable to return to sleep.  The holidays were difficult due to first holiday season without son who passed away on 05/21/2016.  This month will continue to be difficult.  Patient is not exercising and  is lacking motivation to  start exercise program.  Still wakes up at 4:00am.  Bedtime at 11:00; wakes up 8:00am.  Actual sleep 6 hours.  No caffeine in afternoon; coffee in morning.   Rare sodas 12-2.   Venous stasis: Patient frustrated that primary care provider has not recommended a prescription compression stocking.  Granddaughter recently evaluated by Dr. Einar Gip of cardiology and recommended 30 mm of pressure.  Patient was fitted in a local medical supply store, and 15-20 mm of pressure was recommended.  Compliant with compression stockings.  Swelling has greatly improved with compression stockings.  Patient questioning if she needs a diuretic.  Frequently wakes up at 4 AM and unable to return to sleep.  BP Readings from Last 3 Encounters:  04/15/17 (!) 142/82  12/11/16 140/82  12/07/16 (!) 151/79   Wt Readings from Last 3 Encounters:  04/15/17 219 lb (99.3 kg)  12/11/16 221 lb (100.2 kg)  12/07/16 220 lb 8 oz (100 kg)   Immunization History  Administered Date(s) Administered  . Influenza Split 12/28/2011, 02/23/2013, 02/05/2014  . Influenza,inj,Quad PF,6+ Mos 05/31/2015  . Influenza-Unspecified 02/12/2017  . Pneumococcal Conjugate-13 11/16/2013  . Pneumococcal Polysaccharide-23 02/20/2010  . Tdap 11/17/2005  . Zoster 02/16/2010    Review of Systems  Constitutional: Positive for fatigue. Negative for activity change, appetite change, chills, diaphoresis, fever and unexpected weight change.  HENT: Positive for congestion, postnasal drip, rhinorrhea and sneezing. Negative for dental problem, drooling, ear discharge, ear pain, facial swelling, hearing loss, mouth sores, nosebleeds, sinus pressure, sore throat, tinnitus, trouble swallowing and voice change.   Eyes: Negative for photophobia, pain, discharge, redness, itching and  visual disturbance.  Respiratory: Negative for apnea, cough, choking, chest tightness, shortness of breath, wheezing and stridor.   Cardiovascular: Positive for leg swelling. Negative  for chest pain and palpitations.  Gastrointestinal: Negative for abdominal distention, abdominal pain, anal bleeding, blood in stool, constipation, diarrhea, nausea, rectal pain and vomiting.  Endocrine: Negative for cold intolerance, heat intolerance, polydipsia, polyphagia and polyuria.  Genitourinary: Negative for decreased urine volume, difficulty urinating, dyspareunia, dysuria, enuresis, flank pain, frequency, genital sores, hematuria, menstrual problem, pelvic pain, urgency, vaginal bleeding, vaginal discharge and vaginal pain.  Musculoskeletal: Negative for arthralgias, back pain, gait problem, joint swelling, myalgias, neck pain and neck stiffness.  Skin: Negative for color change, pallor, rash and wound.  Allergic/Immunologic: Negative for environmental allergies, food allergies and immunocompromised state.  Neurological: Negative for dizziness, tremors, seizures, syncope, facial asymmetry, speech difficulty, weakness, light-headedness, numbness and headaches.  Hematological: Negative for adenopathy. Does not bruise/bleed easily.  Psychiatric/Behavioral: Positive for sleep disturbance. Negative for agitation, behavioral problems, confusion, decreased concentration, dysphoric mood, hallucinations, self-injury and suicidal ideas. The patient is not nervous/anxious and is not hyperactive.     Past Medical History:  Diagnosis Date  . Allergy   . Anemia    due to vaginal bleeding  . Arthritis    hands  . Breast cancer ([place]) 08/2012   left  . Dental crowns present   . Fibroids   . Headache(784.0)    tension  . History of endometriosis   . Hyperlipidemia   . Hypertension    under control with med., has been on med. x 3 yr.  . OSA (obstructive sleep apnea)   . Postmenopausal bleeding   . S/P radiation therapy 10/20/2012-12/05/2012   left breast 50.4 gray, lumpectomy cavity boosted to 62.4 gray  . Seasonal allergies   . Sleep apnea    uses CPAP nightly  . Urinary urgency   . White  coat hypertension    Past Surgical History:  Procedure Laterality Date  . BACK SURGERY  1997   lumbar  . BREAST LUMPECTOMY WITH NEEDLE LOCALIZATION AND AXILLARY SENTINEL LYMPH NODE BX Left 09/03/2012   Procedure: BREAST LUMPECTOMY WITH NEEDLE LOCALIZATION AND AXILLARY SENTINEL LYMPH NODE BX;  Surgeon: Edward Jolly, MD;  Location: [facility];  Service: General;  Laterality: Left;  . CHOLECYSTECTOMY  1995  . COLONOSCOPY W/ POLYPECTOMY  03/20/2007  . DILATION AND CURETTAGE OF UTERUS  2005  . DILATION AND CURETTAGE OF UTERUS  06/22/2011   Procedure: DILATATION AND CURETTAGE;  Surgeon: Alvino Chapel, MD;  Location: [facility];  Service: Gynecology;  Laterality: N/A;  OK PER KEELA FOR 7:15 START  . DORSAL COMPARTMENT RELEASE Right 05/19/2013   Procedure: RELEASE 1ST  DORSAL COMPARTMENT RIGHT (DEQUERVAIN);  Surgeon: Cammie Sickle., MD;  Location: [facility];  Service: Orthopedics;  Laterality: Right;  . EXPLORATORY LAPAROTOMY  06-30-2007   ATTEMPTED HYSTERECTOMY ABORTED DUE TO EXTENSIVE ENDOMETRIOSIS W/ DENSE PELVIC ADHESIONS INVOLVING UTERUS AND LOWER RECTOSIGMOID  . HYSTEROSCOPY W/D&C  05/06/2009   Allergies  Allergen Reactions  . Adhesive [Tape] Rash  . Latex Itching and Other (See Comments)    Causes blisters   Current Outpatient Medications on File Prior to Visit  Medication Sig Dispense Refill  . albuterol (PROVENTIL HFA;VENTOLIN HFA) 108 (90 Base) MCG/ACT inhaler Inhale 2 puffs into the lungs every 6 (six) hours as needed for wheezing or shortness of breath.    Marland Kitchen aspirin 81 MG tablet Take 81 mg by  mouth daily.    . blood glucose meter kit and supplies KIT Test blood sugar once daily. Dx code: E11.9 1 each 0  . cholecalciferol (VITAMIN D) 1000 units tablet Take 1,000 Units by mouth daily.    . Cyanocobalamin (VITAMIN B-12 CR PO) Take 500 mcg by mouth daily.     Marland Kitchen glucose blood (ONE TOUCH ULTRA TEST) test strip USE ONE STRIP TO  CHECK GLUCOSE ONCE DAILY 100 each 3  . lovastatin (MEVACOR) 40 MG tablet Take 1 tablet (40 mg total) by mouth at bedtime. 90 tablet 1  . metFORMIN (GLUCOPHAGE) 500 MG tablet Take 2 tablets (1,000 mg total) by mouth 2 (two) times daily with a meal. (Patient taking differently: Take 500 mg by mouth 3 (three) times daily. ) 360 tablet 3  . ONETOUCH DELICA LANCETS 38S MISC USE ONE  TO CHECK GLUCOSE ONCE DAILY 100 each 2  . tamoxifen (NOLVADEX) 20 MG tablet TAKE 1 TABLET BY MOUTH ONCE DAILY 180 tablet 0  . vitamin C (ASCORBIC ACID) 500 MG tablet Take 500 mg by mouth daily.     . [DISCONTINUED] LISINOPRIL PO Take 1 tablet by mouth daily.     No current facility-administered medications on file prior to visit.    Social History   Socioeconomic History  . Marital status: Married    Spouse name: [name]  . Number of children: 4  . Years of education: College  . Highest education level: Not on file  Social Needs  . Financial resource strain: Not on file  . Food insecurity - worry: Not on file  . Food insecurity - inability: Not on file  . Transportation needs - medical: Not on file  . Transportation needs - non-medical: Not on file  Occupational History  . Occupation: retired  Tobacco Use  . Smoking status: Never Smoker  . Smokeless tobacco: Never Used  Substance and Sexual Activity  . Alcohol use: No    Alcohol/week: 0.0 oz  . Drug use: No  . Sexual activity: Yes    Comment: menarche 18, P4, Premarin for short while, Provera x 3 years  Other Topics Concern  . Not on file  Social History Narrative   Marital status: married x 54  years; happily married; no abuse.      Children: 4 children (2 sons living; 1 son died in 6422 at age 46years old, 1 daughter); six grandchildren; no gg.      Lives:  With husband.      Employed: retired in 2011; [employer].      Tobacco: none       Alcohol:  None      Drugs: none      Exercise:  Rowing 3 days per week.      Seatbelt:   100%      Sunscreen:  Face SPF 15.      Guns:  Loaded mostly secured.      ADLs:  Drives minimally; no assistant devices; independent with all ADLs.       Advanced Directives: YES; FULL CODE but no prolonged measures.      Her nocturia is improved under CPAP use at 10 cm water- AHI is now 0.8 and from 30 at baseline. CMS compliance  6 hours 40 minutes - sleeps on the side, 08-10-11 .FFM - likes it.    . Flonase used prn.     Caffeine Use: 1 cup daily   Family History  Problem Relation Age of Onset  .  Vision loss Mother   . Hypertension Mother   . Anesthesia problems Mother        post-op N/V  . Heart disease Mother        carotid stenosis s/p CAE  . Osteoporosis Mother   . Depression Father   . Heart disease Sister 18       arrhythmia  . Hypertension Sister   . Hyperlipidemia Sister   . Diabetes Brother   . Hypertension Brother   . Cancer Brother        skin  . Hyperlipidemia Brother   . Stroke Brother   . COPD Daughter   . Cancer Cousin   . Kidney failure Maternal Grandmother   . Heart disease Maternal Grandfather   . Hypertension Other   . Colon cancer Neg Hx        Objective:    BP (!) 142/82   Pulse 86   Temp 97.8 F (36.6 C) (Oral)   Resp 16   Ht 5' 1.81" (1.57 m)   Wt 219 lb (99.3 kg)   SpO2 96%   BMI 40.30 kg/m  Physical Exam  Constitutional: She is oriented to person, place, and time. She appears well-developed and well-nourished. No distress.  HENT:  Head: Normocephalic and atraumatic.  Right Ear: External ear normal.  Left Ear: External ear normal.  Nose: Nose normal.  Mouth/Throat: Oropharynx is clear and moist.  +PND appreciated in OP.  Eyes: Conjunctivae and EOM are normal. Pupils are equal, round, and reactive to light.  Neck: Normal range of motion. Neck supple. Carotid bruit is not present. No thyromegaly present.  Cardiovascular: Normal rate, regular rhythm, normal heart sounds and intact distal pulses. Exam reveals no gallop and no  friction rub.  No murmur heard. Trace to 1+ nonpitting edema bilateral lower extremities.  Pulmonary/Chest: Effort normal and breath sounds normal. She has no wheezes. She has no rales.  Abdominal: Soft. Bowel sounds are normal. She exhibits no distension and no mass. There is no tenderness. There is no rebound and no guarding.  Musculoskeletal: She exhibits edema.  Lymphadenopathy:    She has no cervical adenopathy.  Neurological: She is alert and oriented to person, place, and time. No cranial nerve deficit.  Skin: Skin is warm and dry. No rash noted. She is not diaphoretic. No erythema. No pallor.  Psychiatric: She has a normal mood and affect. Her behavior is normal.   No results found. Depression screen [facility] 2/9 04/15/2017 12/11/2016 12/07/2016 08/01/2016 12/06/2015  Decreased Interest 0 0 0 0 0  Down, Depressed, Hopeless 0 0 1 1 0  PHQ - 2 Score 0 0 1 1 0   Fall Risk  04/15/2017 12/11/2016 12/07/2016 12/06/2015 09/08/2015  Falls in the past year? No No No No No  Number falls in past yr: - - - - -   Results for orders placed or performed in visit on 04/15/17  POCT glucose (manual entry)  Result Value Ref Range   POC Glucose 140 (A) 70 - 99 mg/dl  POCT glycosylated hemoglobin (Hb A1C)  Result Value Ref Range   Hemoglobin A1C 6.8         Assessment & Plan:   1. Type 2 diabetes mellitus without complication, without long-term current use of insulin ([place])   2. Essential hypertension   3. Pure hypercholesterolemia   4. Osteopenia of neck of right femur   5. Seasonal allergic rhinitis due to pollen   6. Obstructive sleep apnea on CPAP  7. Malignant neoplasm of central portion of left breast in female, estrogen receptor positive ([place])    Worsening controlled diabetes mellitus type 2 due to noncompliance with diet, exercise, metformin twice daily dosing.  He should frequently forgetting evening metformin dose.  Encouraged improved compliance with his evening metformin dose.  Also  encouraged regular exercise, dietary modification, weight loss.  Hypertension with borderline control in office at home readings normal in the 120s over 70s.  No change to regimen at this time.  Obtain labs for chronic disease management.  Hypercholesterolemia uncontrolled due to noncompliance with lovastatin therapy.  I reviewed current guidelines recommending all diabetics be maintained on statin therapy.  LDL currently uncontrolled at 130.  Repeat this today.  Status post bone density scan with gynecology.  Mild osteopenia present.  Recommend calcium 600 mg twice daily or 3 servings of dairy daily.  Recommend vitamin D 1000 international units daily.  Recommend daily weight bearing exercise for 30 minutes.  Repeat bone density scan in 2 years.  Her cough is persistent despite 60-monthtrial of Prilosec therapy.  We have also switched lisinopril therapy to a different agent without improvement in cough.  Patient suffering with significant postnasal drainage and nasal congestion today with frequent throat clearing during exam.  Patient compliant with loratadine therapy.  Patient refuses nasal spray therapy.  Patient refuses addition of Singulair therapy.  Fatigue: New onset.  Patient reports compliance with CPAP therapy.  Currently not exercising.  TSH in September 2018 normal.  Most likely multifactorial due to insomnia, ongoing grief from death of son in the past year, lack of exercise.  Highly encouraged daily exercise regimen for insomnia therapy, grief therapy, fatigue therapy.   Orders Placed This Encounter  Procedures  . Pneumococcal polysaccharide vaccine 23-valent greater than or equal to 2yo subcutaneous/IM  . CBC with Differential/Platelet  . Comprehensive metabolic panel    Order Specific Question:   Has the patient fasted?    Answer:   No  . Lipid panel    Order Specific Question:   Has the patient fasted?    Answer:   No  . VITAMIN D 25 Hydroxy (Vit-D Deficiency, Fractures)  .  POCT glucose (manual entry)  . POCT glycosylated hemoglobin (Hb A1C)   Meds ordered this encounter  Medications  . lisinopril-hydrochlorothiazide (PRINZIDE,ZESTORETIC) 10-12.5 MG tablet    Sig: Take 1 tablet by mouth daily.    Dispense:  90 tablet    Refill:  1  . metoprolol succinate (TOPROL-XL) 25 MG 24 hr tablet    Sig: Take 1 tablet (25 mg total) by mouth daily.    Dispense:  90 tablet    Refill:  1    DELETE 50MG METOPROLOL RX    Return in about 3 months (around 07/14/2017) for follow-up chronic medical conditions.   Sayde Lish MElayne Guerin M.D. [facility] [place] [place]GT[address] [place]  [phone]

## 2017-04-15 NOTE — Patient Instructions (Addendum)
   IF you received an x-ray today, you will receive an invoice from [facility]. Please contact [facility] at [phone] with questions or concerns regarding your invoice.   IF you received labwork today, you will receive an invoice from [facility]. Please contact [facility] at [phone] with questions or concerns regarding your invoice.   Our billing staff will not be able to assist you with questions regarding bills from these companies.  You will be contacted with the lab results as soon as they are available. The fastest way to get your results is to activate your My Chart account. Instructions are located on the last page of this paperwork. If you have not heard from us regarding the results in 2 weeks, please contact this office.      Diabetes Mellitus and Standards of Medical Care Managing diabetes (diabetes mellitus) can be complicated. Your diabetes treatment may be managed by a team of health care providers, including:  A diet and nutrition specialist (registered dietitian).  A nurse.  A certified diabetes educator (CDE).  A diabetes specialist (endocrinologist).  An eye doctor.  A primary care provider.  A dentist.  Your health care providers follow a schedule in order to help you get the best quality of care. The following schedule is a general guideline for your diabetes management plan. Your health care providers may also give you more specific instructions. HbA1c ( hemoglobin A1c) test This test provides information about blood sugar (glucose) control over the previous 2-3 months. It is used to check whether your diabetes management plan needs to be adjusted.  If you are meeting your treatment goals, this test is done at least 2 times a year.  If you are not meeting treatment goals or if your treatment goals have changed, this test is done 4 times a year.  Blood pressure test  This test is done at every routine medical visit. For most  people, the goal is less than 130/80. Ask your health care provider what your goal blood pressure should be. Dental and eye exams  Visit your dentist two times a year.  If you have type 1 diabetes, get an eye exam 3-5 years after you are diagnosed, and then once a year after your first exam. ? If you were diagnosed with type 1 diabetes as a child, get an eye exam when you are age 10 or older and have had diabetes for 3-5 years. After the first exam, you should get an eye exam once a year.  If you have type 2 diabetes, have an eye exam as soon as you are diagnosed, and then once a year after your first exam. Foot care exam  Visual foot exams are done at every routine medical visit. The exams check for cuts, bruises, redness, blisters, sores, or other problems with the feet.  A complete foot exam is done by your health care provider once a year. This exam includes an inspection of the structure and skin of your feet, and a check of the pulses and sensation in your feet. ? Type 1 diabetes: Get your first exam 3-5 years after diagnosis. ? Type 2 diabetes: Get your first exam as soon as you are diagnosed.  Check your feet every day for cuts, bruises, redness, blisters, or sores. If you have any of these or other problems that are not healing, contact your health care provider. Kidney function test ( urine microalbumin)  This test is done once a year. ? Type 1 diabetes:   Get your first test 5 years after diagnosis. ? Type 2 diabetes: Get your first test as soon as you are diagnosed.  If you have chronic kidney disease (CKD), get a serum creatinine and estimated glomerular filtration rate (eGFR) test once a year. Lipid profile (cholesterol, HDL, LDL, triglycerides)  This test should be done when you are diagnosed with diabetes, and every 5 years after the first test. If you are on medicines to lower your cholesterol, you may need to get this test done every year. ? The goal for LDL is less than  100 mg/dL (5.5 mmol/L). If you are at high risk, the goal is less than 70 mg/dL (3.9 mmol/L). ? The goal for HDL is 40 mg/dL (2.2 mmol/L) for men and 50 mg/dL(2.8 mmol/L) for women. An HDL cholesterol of 60 mg/dL (3.3 mmol/L) or higher gives some protection against heart disease. ? The goal for triglycerides is less than 150 mg/dL (8.3 mmol/L). Immunizations  The yearly flu (influenza) vaccine is recommended for everyone 6 months or older who has diabetes.  The pneumonia (pneumococcal) vaccine is recommended for everyone 2 years or older who has diabetes. If you are 65 or older, you may get the pneumonia vaccine as a series of two separate shots.  The hepatitis B vaccine is recommended for adults shortly after they have been diagnosed with diabetes.  The Tdap (tetanus, diphtheria, and pertussis) vaccine should be given: ? According to normal childhood vaccination schedules, for children. ? Every 10 years, for adults who have diabetes.  The shingles vaccine is recommended for people who have had chicken pox and are 50 years or older. Mental and emotional health  Screening for symptoms of eating disorders, anxiety, and depression is recommended at the time of diagnosis and afterward as needed. If your screening shows that you have symptoms (you have a positive screening result), you may need further evaluation and be referred to a mental health care provider. Diabetes self-management education  Education about how to manage your diabetes is recommended at diagnosis and ongoing as needed. Treatment plan  Your treatment plan will be reviewed at every medical visit. Summary  Managing diabetes (diabetes mellitus) can be complicated. Your diabetes treatment may be managed by a team of health care providers.  Your health care providers follow a schedule in order to help you get the best quality of care.  Standards of care including having regular physical exams, blood tests, blood pressure  monitoring, immunizations, screening tests, and education about how to manage your diabetes.  Your health care providers may also give you more specific instructions based on your individual health. This information is not intended to replace advice given to you by your health care provider. Make sure you discuss any questions you have with your health care provider. Document Released: 12/31/2008 Document Revised: 12/02/2015 Document Reviewed: 12/02/2015 Elsevier Interactive Patient Education  2018 [employer]  

## 2017-04-16 LAB — CBC WITH DIFFERENTIAL/PLATELET
BASOS ABS: 0 10*3/uL (ref 0.0–0.2)
Basos: 0 %
EOS (ABSOLUTE): 0.2 10*3/uL (ref 0.0–0.4)
EOS: 3 %
HEMOGLOBIN: 12.4 g/dL (ref 11.1–15.9)
Hematocrit: 38.7 % (ref 34.0–46.6)
IMMATURE GRANULOCYTES: 0 %
Immature Grans (Abs): 0 10*3/uL (ref 0.0–0.1)
LYMPHS ABS: 1.5 10*3/uL (ref 0.7–3.1)
Lymphs: 27 %
MCH: 28.3 pg (ref 26.6–33.0)
MCHC: 32 g/dL (ref 31.5–35.7)
MCV: 88 fL (ref 79–97)
MONOCYTES: 8 %
MONOS ABS: 0.5 10*3/uL (ref 0.1–0.9)
NEUTROS PCT: 62 %
Neutrophils Absolute: 3.4 10*3/uL (ref 1.4–7.0)
Platelets: 234 10*3/uL (ref 150–379)
RBC: 4.38 x10E6/uL (ref 3.77–5.28)
RDW: 13.5 % (ref 12.3–15.4)
WBC: 5.6 10*3/uL (ref 3.4–10.8)

## 2017-04-16 LAB — COMPREHENSIVE METABOLIC PANEL
ALK PHOS: 86 IU/L (ref 39–117)
ALT: 22 IU/L (ref 0–32)
AST: 36 IU/L (ref 0–40)
Albumin/Globulin Ratio: 1.4 (ref 1.2–2.2)
Albumin: 4.1 g/dL (ref 3.5–4.8)
BUN/Creatinine Ratio: 16 (ref 12–28)
BUN: 12 mg/dL (ref 8–27)
Bilirubin Total: 0.6 mg/dL (ref 0.0–1.2)
CALCIUM: 9.6 mg/dL (ref 8.7–10.3)
CO2: 22 mmol/L (ref 20–29)
Chloride: 96 mmol/L (ref 96–106)
Creatinine, Ser: 0.73 mg/dL (ref 0.57–1.00)
GFR calc Af Amer: 95 mL/min/{1.73_m2} (ref >59)
GFR, EST NON AFRICAN AMERICAN: 83 mL/min/{1.73_m2} (ref >59)
GLUCOSE: 144 mg/dL — AB (ref 65–99)
Globulin, Total: 2.9 g/dL (ref 1.5–4.5)
Potassium: 3.9 mmol/L (ref 3.5–5.2)
Sodium: 135 mmol/L (ref 134–144)
Total Protein: 7 g/dL (ref 6.0–8.5)

## 2017-04-16 LAB — LIPID PANEL
CHOLESTEROL TOTAL: 209 mg/dL — AB (ref 100–199)
Chol/HDL Ratio: 4.4 ratio (ref 0.0–4.4)
HDL: 47 mg/dL (ref >39)
LDL CALC: 134 mg/dL — AB (ref 0–99)
TRIGLYCERIDES: 138 mg/dL (ref 0–149)
VLDL CHOLESTEROL CAL: 28 mg/dL (ref 5–40)

## 2017-04-16 LAB — VITAMIN D 25 HYDROXY (VIT D DEFICIENCY, FRACTURES): VIT D 25 HYDROXY: 26.3 ng/mL — AB (ref 30.0–100.0)

## 2017-04-22 ENCOUNTER — Other Ambulatory Visit: Payer: Self-pay

## 2017-04-22 ENCOUNTER — Encounter: Payer: Self-pay | Admitting: Family Medicine

## 2017-04-22 ENCOUNTER — Ambulatory Visit (INDEPENDENT_AMBULATORY_CARE_PROVIDER_SITE_OTHER): Payer: Medicare Other | Admitting: Family Medicine

## 2017-04-22 VITALS — BP 134/68 | HR 102 | Temp 98.3°F | Resp 16 | Ht 62.0 in | Wt 221.4 lb

## 2017-04-22 DIAGNOSIS — R059 Cough, unspecified: Secondary | ICD-10-CM

## 2017-04-22 DIAGNOSIS — R05 Cough: Secondary | ICD-10-CM

## 2017-04-22 MED ORDER — BENZONATATE 100 MG PO CAPS: ORAL_CAPSULE | ORAL | 0 refills | Status: DC

## 2017-04-22 NOTE — Patient Instructions (Addendum)
     IF you received an x-ray today, you will receive an invoice from [facility]. Please contact [facility] at [phone] with questions or concerns regarding your invoice.   IF you received labwork today, you will receive an invoice from [facility]. Please contact [facility] at [phone] with questions or concerns regarding your invoice.   Our billing staff will not be able to assist you with questions regarding bills from these companies.  You will be contacted with the lab results as soon as they are available. The fastest way to get your results is to activate your My Chart account. Instructions are located on the last page of this paperwork. If you have not heard from us regarding the results in 2 weeks, please contact this office.     Cough, Adult Coughing is a reflex that clears your throat and your airways. Coughing helps to heal and protect your lungs. It is normal to cough occasionally, but a cough that happens with other symptoms or lasts a long time may be a sign of a condition that needs treatment. A cough may last only 2-3 weeks (acute), or it may last longer than 8 weeks (chronic). What are the causes? Coughing is commonly caused by:  Breathing in substances that irritate your lungs.  A viral or bacterial respiratory infection.  Allergies.  Asthma.  Postnasal drip.  Smoking.  Acid backing up from the stomach into the esophagus (gastroesophageal reflux).  Certain medicines.  Chronic lung problems, including COPD (or rarely, lung cancer).  Other medical conditions such as heart failure.  Follow these instructions at home: Pay attention to any changes in your symptoms. Take these actions to help with your discomfort:  Take medicines only as told by your health care provider. ? If you were prescribed an antibiotic medicine, take it as told by your health care provider. Do not stop taking the antibiotic even if you start to feel better. ? Talk  with your health care provider before you take a cough suppressant medicine.  Drink enough fluid to keep your urine clear or pale yellow.  If the air is dry, use a cold steam vaporizer or humidifier in your bedroom or your home to help loosen secretions.  Avoid anything that causes you to cough at work or at home.  If your cough is worse at night, try sleeping in a semi-upright position.  Avoid cigarette smoke. If you smoke, quit smoking. If you need help quitting, ask your health care provider.  Avoid caffeine.  Avoid alcohol.  Rest as needed.  Contact a health care provider if:  You have new symptoms.  You cough up pus.  Your cough does not get better after 2-3 weeks, or your cough gets worse.  You cannot control your cough with suppressant medicines and you are losing sleep.  You develop pain that is getting worse or pain that is not controlled with pain medicines.  You have a fever.  You have unexplained weight loss.  You have night sweats. Get help right away if:  You cough up blood.  You have difficulty breathing.  Your heartbeat is very fast. This information is not intended to replace advice given to you by your health care provider. Make sure you discuss any questions you have with your health care provider. Document Released: 09/01/2010 Document Revised: 08/11/2015 Document Reviewed: 05/12/2014 Elsevier Interactive Patient Education  2018 [employer]  

## 2017-04-22 NOTE — Progress Notes (Signed)
2/4/201910:43 AM  [name] Jan 27, 1946, 72 y.o. female [id number]  Chief Complaint  Patient presents with  . Cough    x 1 wk    HPI:   Patient is a 72 y.o. female with past medical history significant for DM2 who presents today for about 5 days of cough. It started with sore throat and nasal congestion which have improved, but cough persist. Last night it was horrible, coughed all night long, with 2 episodes of post-tussive emesis. She reports cough is dry, non productive. She had some chills at the beginning, but currently no chills or fevers. She denies any SOB.   Depression screen [facility] 2/9 04/22/2017 04/15/2017 12/11/2016  Decreased Interest 0 0 0  Down, Depressed, Hopeless 0 0 0  PHQ - 2 Score 0 0 0    Allergies  Allergen Reactions  . Adhesive [Tape] Rash  . Latex Itching and Other (See Comments)    Causes blisters    Prior to Admission medications   Medication Sig Start Date End Date Taking? Authorizing Provider  albuterol (PROVENTIL HFA;VENTOLIN HFA) 108 (90 Base) MCG/ACT inhaler Inhale 2 puffs into the lungs every 6 (six) hours as needed for wheezing or shortness of breath.   Yes [provider]  aspirin 81 MG tablet Take 81 mg by mouth daily.   Yes [provider]  cholecalciferol (VITAMIN D) 1000 units tablet Take 1,000 Units by mouth daily.   Yes [provider]  Cyanocobalamin (VITAMIN B-12 CR PO) Take 500 mcg by mouth daily.    Yes [provider]  lisinopril-hydrochlorothiazide (PRINZIDE,ZESTORETIC) 10-12.5 MG tablet Take 1 tablet by mouth daily. 04/15/17  Yes [name], MD  lovastatin (MEVACOR) 40 MG tablet Take 1 tablet (40 mg total) by mouth at bedtime. 08/01/16  Yes [name], MD  metFORMIN (GLUCOPHAGE) 500 MG tablet Take 2 tablets (1,000 mg total) by mouth 2 (two) times daily with a meal. Patient taking differently: Take 500 mg by mouth 3 (three) times daily.  09/08/15  Yes [name], MD  metoprolol  succinate (TOPROL-XL) 25 MG 24 hr tablet Take 1 tablet (25 mg total) by mouth daily. 04/15/17  Yes [name], MD  tamoxifen (NOLVADEX) 20 MG tablet TAKE 1 TABLET BY MOUTH ONCE DAILY 03/25/17  Yes Nicholas Lose, MD  vitamin C (ASCORBIC ACID) 500 MG tablet Take 500 mg by mouth daily.    Yes [provider]  blood glucose meter kit and supplies KIT Test blood sugar once daily. Dx code: E11.9 Patient not taking: Reported on 04/22/2017 06/23/14   [name], MD  glucose blood (ONE TOUCH ULTRA TEST) test strip USE ONE STRIP TO CHECK GLUCOSE ONCE DAILY Patient not taking: Reported on 04/22/2017 08/01/16   [name], MD  Neeses LANCETS 84X MISC USE ONE  TO CHECK GLUCOSE ONCE DAILY Patient not taking: Reported on 04/22/2017 09/15/15   [name], MD  LISINOPRIL PO Take 1 tablet by mouth daily.  06/13/11  [provider]    Past Medical History:  Diagnosis Date  . Allergy   . Anemia    due to vaginal bleeding  . Arthritis    hands  . Breast cancer ([place]) 08/2012   left  . Dental crowns present   . Fibroids   . Headache(784.0)    tension  . History of endometriosis   . Hyperlipidemia   . Hypertension    under control with med., has been on  med. x 3 yr.  . OSA (obstructive sleep apnea)   . Postmenopausal bleeding   . S/P radiation therapy 10/20/2012-12/05/2012   left breast 50.4 gray, lumpectomy cavity boosted to 62.4 gray  . Seasonal allergies   . Sleep apnea    uses CPAP nightly  . Urinary urgency   . White coat hypertension     Past Surgical History:  Procedure Laterality Date  . BACK SURGERY  1997   lumbar  . BREAST LUMPECTOMY WITH NEEDLE LOCALIZATION AND AXILLARY SENTINEL LYMPH NODE BX Left 09/03/2012   Procedure: BREAST LUMPECTOMY WITH NEEDLE LOCALIZATION AND AXILLARY SENTINEL LYMPH NODE BX;  Surgeon: Edward Jolly, MD;  Location: [facility];  Service: General;  Laterality: Left;  . CHOLECYSTECTOMY  1995  . COLONOSCOPY W/  POLYPECTOMY  03/20/2007  . DILATION AND CURETTAGE OF UTERUS  2005  . DILATION AND CURETTAGE OF UTERUS  06/22/2011   Procedure: DILATATION AND CURETTAGE;  Surgeon: Alvino Chapel, MD;  Location: [facility];  Service: Gynecology;  Laterality: N/A;  OK PER KEELA FOR 7:15 START  . DORSAL COMPARTMENT RELEASE Right 05/19/2013   Procedure: RELEASE 1ST  DORSAL COMPARTMENT RIGHT (DEQUERVAIN);  Surgeon: Cammie Sickle., MD;  Location: [facility];  Service: Orthopedics;  Laterality: Right;  . EXPLORATORY LAPAROTOMY  06-30-2007   ATTEMPTED HYSTERECTOMY ABORTED DUE TO EXTENSIVE ENDOMETRIOSIS W/ DENSE PELVIC ADHESIONS INVOLVING UTERUS AND LOWER RECTOSIGMOID  . HYSTEROSCOPY W/D&C  05/06/2009    Social History   Tobacco Use  . Smoking status: Never Smoker  . Smokeless tobacco: Never Used  Substance Use Topics  . Alcohol use: No    Alcohol/week: 0.0 oz    Family History  Problem Relation Age of Onset  . Vision loss Mother   . Hypertension Mother   . Anesthesia problems Mother        post-op N/V  . Heart disease Mother        carotid stenosis s/p CAE  . Osteoporosis Mother   . Depression Father   . Heart disease Sister 18       arrhythmia  . Hypertension Sister   . Hyperlipidemia Sister   . Diabetes Brother   . Hypertension Brother   . Cancer Brother        skin  . Hyperlipidemia Brother   . Stroke Brother   . COPD Daughter   . Cancer Cousin   . Kidney failure Maternal Grandmother   . Heart disease Maternal Grandfather   . Hypertension Other   . Colon cancer Neg Hx     ROS Per hpi  OBJECTIVE:  Blood pressure 134/68, pulse (!) 102, temperature 98.3 F (36.8 C), temperature source Oral, resp. rate 16, height _0  (1.575 m), weight 221 lb 6.4 oz (100.4 kg), SpO2 97 %.  Physical Exam  Constitutional: She is oriented to person, place, and time and well-developed, well-nourished, and in no distress.  HENT:  Head: Normocephalic and atraumatic.    Right Ear: Hearing, tympanic membrane, external ear and ear canal normal.  Left Ear: Hearing, tympanic membrane, external ear and ear canal normal.  Mouth/Throat: Oropharynx is clear and moist.  Eyes: EOM are normal. Pupils are equal, round, and reactive to light.  Neck: Neck supple.  Cardiovascular: Normal rate, regular rhythm and normal heart sounds. Exam reveals no gallop and no friction rub.  No murmur heard. Pulmonary/Chest: Effort normal and breath sounds normal. She has no wheezes. She has no rales.  Lymphadenopathy:  She has no cervical adenopathy.  Neurological: She is alert and oriented to person, place, and time. Gait normal.  Skin: Skin is warm and dry.      Peak flow reading is 300, about 96 % of predicted.  ASSESSMENT and PLAN  1. Cough Discussed postviral, no clinical concerns for pneumonia at this time. Discussed supportive measures and RTC precautions.  Other orders - Check Peak Flow - benzonatate (TESSALON) 100 MG capsule; Take 1-2 capsules by mouth every 8 hours as needed for cough  Return if symptoms worsen or fail to improve.    Rutherford Guys, MD [facility] [place], [place] [address] Ph.  [phone] Fax [phone]

## 2017-05-09 ENCOUNTER — Ambulatory Visit (INDEPENDENT_AMBULATORY_CARE_PROVIDER_SITE_OTHER): Payer: Medicare Other | Admitting: Neurology

## 2017-05-09 ENCOUNTER — Telehealth: Payer: Self-pay | Admitting: Hematology and Oncology

## 2017-05-09 ENCOUNTER — Inpatient Hospital Stay: Payer: Medicare Other | Attending: Hematology and Oncology | Admitting: Hematology and Oncology

## 2017-05-09 ENCOUNTER — Telehealth: Payer: Self-pay | Admitting: Neurology

## 2017-05-09 ENCOUNTER — Encounter: Payer: Self-pay | Admitting: Neurology

## 2017-05-09 VITALS — BP 129/73 | HR 85 | Ht 62.0 in | Wt 225.0 lb

## 2017-05-09 DIAGNOSIS — Z9989 Dependence on other enabling machines and devices: Secondary | ICD-10-CM | POA: Diagnosis not present

## 2017-05-09 DIAGNOSIS — C50212 Malignant neoplasm of upper-inner quadrant of left female breast: Secondary | ICD-10-CM | POA: Diagnosis not present

## 2017-05-09 DIAGNOSIS — G4733 Obstructive sleep apnea (adult) (pediatric): Secondary | ICD-10-CM | POA: Diagnosis not present

## 2017-05-09 DIAGNOSIS — Z6841 Body Mass Index (BMI) 40.0 and over, adult: Secondary | ICD-10-CM | POA: Diagnosis not present

## 2017-05-09 DIAGNOSIS — Z79810 Long term (current) use of selective estrogen receptor modulators (SERMs): Secondary | ICD-10-CM | POA: Insufficient documentation

## 2017-05-09 DIAGNOSIS — C50112 Malignant neoplasm of central portion of left female breast: Secondary | ICD-10-CM | POA: Diagnosis not present

## 2017-05-09 DIAGNOSIS — E66813 Obesity, class 3: Secondary | ICD-10-CM

## 2017-05-09 DIAGNOSIS — M48062 Spinal stenosis, lumbar region with neurogenic claudication: Secondary | ICD-10-CM | POA: Diagnosis not present

## 2017-05-09 DIAGNOSIS — Z170 Estrogen receptor positive status [ER+]: Secondary | ICD-10-CM | POA: Diagnosis not present

## 2017-05-09 NOTE — Assessment & Plan Note (Signed)
Left breast invasive lobular cancerER/PR positive HER-2 negative (initial biopsy showed HER-2 positive but final pathology on the lumpectomy did not) status post left breast lumpectomy and radiation and currently on antiestrogen therapy with Arimidex started 12/01/2012.  Tamoxifen toxicities: Marked improvement in her ability to tolerate the medication. Denies any significant hot flashes. She does have muscle aches especially in the lower back but they're not as bad as before.  Right humerus fracture: after fall. Much improved.  Breast cancer surveillance: 1. Breast exam  05/09/2017 is normal 2. Mammogram 12/20/2016 is normal breast density category B 3. Bone density June 2016: T score -1.4  Hypercalcemia:She has had episodic hypercalcemia. PTH level was normal April 2016 (52).   Return to clinic in 1 year for follow-up.   

## 2017-05-09 NOTE — Telephone Encounter (Signed)
Gave avs and calendar for February 2020 °

## 2017-05-09 NOTE — Progress Notes (Signed)
PATIENT: [name] DOB: November 03, 1945  REASON FOR VISIT: follow up- obstructive sleep apnea on CPAP HISTORY FROM: patient  HISTORY OF PRESENT ILLNESS:  05-09-2017, CD  [name] is a 72 year old caucasian, married female, here seen in the presence of her husband. She is a breast cancer survivor. She uses nasal pillows. 2 years ago she got a new machine after her first machine broke. At the time she switched from [facility] to nasal.    I received and reviewed the patient CPAP download from her DME company. It appears the patient has used her CPAP nightly for the last 30 days. Each night she uses her machine greater than 4 hours. On average she uses her machine 8 hours and 38 minutes. Her average pressure is 9.6 cm of water. Her residual AHI is 3.9. This is an excellent control of apnea per compliance download. Epworth 4, FSS 28. She reports her hands get numb, and  when in bed her feet will get numb- she had back surgery and had symptoms before ( left sided sciatica- PAIN) .   [name] is a 73 year old female with a history of obstructive sleep apnea on CPAP. She returns today for a compliance download. Unfortunately we are unable to obtain a wireless download. We will have her DME company fax [place] over a 30 day download. The patient states that she continues to use her CPAP nightly. Denies any trouble falling asleep. She continues to feel that the machine works well. She does note that sometimes if she sleeps on her side she will notice that the mask leaks slightly. She does change out her supplies regularly. She reports that she lost her eldest son on February 5. This was unexpected. She denies any new neurological symptoms. Returns today for an evaluation.  HISTORY  11/02/15 [facility]): [name] is a 72 year old female with a history of obstructive sleep apnea on CPAP. She returns today for a 30 day compliance download.   Her download today on 11/02/2015 documents an 87% compliance with an  average user time of 6 hours and 37 minutes on all days. The patient's residual AHI has increased to 4.4 which is still good but used to be a little bit lower area to she uses an auto CPAP with a 90's percent pressure of 9.6 cm water the window of pressures between 6 and 12 cm water. She has a very long ramp time of 30 minutes. She has noticed that she has a little bit more trouble to initiate sleep on her new machine and I think this may be related to the very long ramp time. I will shorten the REM time to 12 minutes.  HISTORY 10/27/13:   [name] is 72 year old female with history of obstructive sleep Apnea on CPAP. She returns today for a 90 day compliance download. She brought her machine with her today and her reports shows an AHI of 2.2 on auto-titration at 6-12 cm of water. She uses her machine for an average of 6 hours and 22 minutes a night, with 97%compliance. His Epworth score is 4 points was previously 3 . His fatigue severity score is 8 was previously 16. Patient reports that she gets about 6.5 hours of sleep a night. She goes to bed around 11:30 pm and arises between 6:30-7:30am. She denies having trouble falling asleep or staying a sleep. States that she normally does not have to get up at night to urinate. Overall patient feels that CPAP  has improved his sleepiness and fatigue. Since the last visit the Patient was diagnosed with breast cancer in June 2014. She is on an estrogen blocker now which has caused her to gain weight. She states she is now cancer free.   REVIEW OF SYSTEMS: Out of a complete 14 system review of symptoms, the patient complains only of the following symptoms, and all other reviewed systems are negative.  Back pain, cough, wheezing, runny nose, chills, eye itching, environmental allergies- not sleepy and not fatigued.     ALLERGIES: Allergies  Allergen Reactions  . Adhesive [Tape] Rash  . Latex Itching and Other (See Comments)    Causes blisters    HOME  MEDICATIONS: Outpatient Medications Prior to Visit  Medication Sig Dispense Refill  . albuterol (PROVENTIL HFA;VENTOLIN HFA) 108 (90 Base) MCG/ACT inhaler Inhale 2 puffs into the lungs every 6 (six) hours as needed for wheezing or shortness of breath.    Marland Kitchen aspirin 81 MG tablet Take 81 mg by mouth daily.    . benzonatate (TESSALON) 100 MG capsule Take 1-2 capsules by mouth every 8 hours as needed for cough 20 capsule 0  . blood glucose meter kit and supplies KIT Test blood sugar once daily. Dx code: E11.9 1 each 0  . cholecalciferol (VITAMIN D) 1000 units tablet Take 1,000 Units by mouth daily.    . Cyanocobalamin (VITAMIN B-12 CR PO) Take 500 mcg by mouth daily.     Marland Kitchen glucose blood (ONE TOUCH ULTRA TEST) test strip USE ONE STRIP TO CHECK GLUCOSE ONCE DAILY 100 each 3  . lisinopril-hydrochlorothiazide (PRINZIDE,ZESTORETIC) 10-12.5 MG tablet Take 1 tablet by mouth daily. 90 tablet 1  . lovastatin (MEVACOR) 40 MG tablet Take 1 tablet (40 mg total) by mouth at bedtime. 90 tablet 1  . metFORMIN (GLUCOPHAGE) 500 MG tablet Take 2 tablets (1,000 mg total) by mouth 2 (two) times daily with a meal. (Patient taking differently: Take 500 mg by mouth 3 (three) times daily. ) 360 tablet 3  . metoprolol succinate (TOPROL-XL) 25 MG 24 hr tablet Take 1 tablet (25 mg total) by mouth daily. 90 tablet 1  . ONETOUCH DELICA LANCETS 53Z MISC USE ONE  TO CHECK GLUCOSE ONCE DAILY 100 each 2  . tamoxifen (NOLVADEX) 20 MG tablet TAKE 1 TABLET BY MOUTH ONCE DAILY 180 tablet 0  . vitamin C (ASCORBIC ACID) 500 MG tablet Take 500 mg by mouth daily.      No facility-administered medications prior to visit.     PAST MEDICAL HISTORY: Past Medical History:  Diagnosis Date  . Allergy   . Anemia    due to vaginal bleeding  . Arthritis    hands  . Breast cancer ([place]) 08/2012   left  . Dental crowns present   . Fibroids   . Headache(784.0)    tension  . History of endometriosis   . Hyperlipidemia   . Hypertension     under control with med., has been on med. x 3 yr.  . OSA (obstructive sleep apnea)   . Postmenopausal bleeding   . S/P radiation therapy 10/20/2012-12/05/2012   left breast 50.4 gray, lumpectomy cavity boosted to 62.4 gray  . Seasonal allergies   . Sleep apnea    uses CPAP nightly  . Urinary urgency   . White coat hypertension     PAST SURGICAL HISTORY: Past Surgical History:  Procedure Laterality Date  . BACK SURGERY  1997   lumbar  . BREAST LUMPECTOMY WITH NEEDLE LOCALIZATION  AND AXILLARY SENTINEL LYMPH NODE BX Left 09/03/2012   Procedure: BREAST LUMPECTOMY WITH NEEDLE LOCALIZATION AND AXILLARY SENTINEL LYMPH NODE BX;  Surgeon: Edward Jolly, MD;  Location: [facility];  Service: General;  Laterality: Left;  . CHOLECYSTECTOMY  1995  . COLONOSCOPY W/ POLYPECTOMY  03/20/2007  . DILATION AND CURETTAGE OF UTERUS  2005  . DILATION AND CURETTAGE OF UTERUS  06/22/2011   Procedure: DILATATION AND CURETTAGE;  Surgeon: Alvino Chapel, MD;  Location: [facility];  Service: Gynecology;  Laterality: N/A;  OK PER KEELA FOR 7:15 START  . DORSAL COMPARTMENT RELEASE Right 05/19/2013   Procedure: RELEASE 1ST  DORSAL COMPARTMENT RIGHT (DEQUERVAIN);  Surgeon: Cammie Sickle., MD;  Location: [facility];  Service: Orthopedics;  Laterality: Right;  . EXPLORATORY LAPAROTOMY  06-30-2007   ATTEMPTED HYSTERECTOMY ABORTED DUE TO EXTENSIVE ENDOMETRIOSIS W/ DENSE PELVIC ADHESIONS INVOLVING UTERUS AND LOWER RECTOSIGMOID  . HYSTEROSCOPY W/D&C  05/06/2009    FAMILY HISTORY: Family History  Problem Relation Age of Onset  . Vision loss Mother   . Hypertension Mother   . Anesthesia problems Mother        post-op N/V  . Heart disease Mother        carotid stenosis s/p CAE  . Osteoporosis Mother   . Depression Father   . Heart disease Sister 18       arrhythmia  . Hypertension Sister   . Hyperlipidemia Sister   . Diabetes Brother   . Hypertension Brother    . Cancer Brother        skin  . Hyperlipidemia Brother   . Stroke Brother   . COPD Daughter   . Cancer Cousin   . Kidney failure Maternal Grandmother   . Heart disease Maternal Grandfather   . Hypertension Other   . Colon cancer Neg Hx     SOCIAL HISTORY: Social History   Socioeconomic History  . Marital status: Married    Spouse name: [name]  . Number of children: 4  . Years of education: College  . Highest education level: Not on file  Social Needs  . Financial resource strain: Not on file  . Food insecurity - worry: Not on file  . Food insecurity - inability: Not on file  . Transportation needs - medical: Not on file  . Transportation needs - non-medical: Not on file  Occupational History  . Occupation: retired  Tobacco Use  . Smoking status: Never Smoker  . Smokeless tobacco: Never Used  Substance and Sexual Activity  . Alcohol use: No    Alcohol/week: 0.0 oz  . Drug use: No  . Sexual activity: Yes    Comment: menarche 17, P4, Premarin for short while, Provera x 3 years  Other Topics Concern  . Not on file  Social History Narrative   Marital status: married x 92  years; happily married; no abuse.      Children: 4 children (2 sons living; 1 son died in 5955 at age 66years old, 1 daughter); six grandchildren; no gg.      Lives:  With husband.      Employed: retired in 2011; [employer].      Tobacco: none       Alcohol:  None      Drugs: none      Exercise:  Rowing 3 days per week.      Seatbelt:  100%      Sunscreen:  Face  SPF 15.      Guns:  Loaded mostly secured.      ADLs:  Drives minimally; no assistant devices; independent with all ADLs.       Advanced Directives: YES; FULL CODE but no prolonged measures.      Her nocturia is improved under CPAP use at 10 cm water- AHI is now 0.8 and from 30 at baseline. CMS compliance  6 hours 40 minutes - sleeps on the side, 08-10-11 .FFM - likes it.    . Flonase used prn.     Caffeine Use: 1  cup daily      PHYSICAL EXAM  Vitals:   05/09/17 0812  BP: 129/73  Pulse: 85  Weight: 225 lb (102.1 kg)  Height: 5' 2"  (1.575 m)   Body mass index is 41.15 kg/m.   Neck Circumference 16 inches Mallampati 2-3+   Generalized: Well developed, in no acute distress   Neurological examination  Mentation: Alert oriented to time, place, history taking. Follows all commands speech and language fluent Cranial nerve:  Taste and smell have changed - she feels her senses have recovered, taste was "funny " when she had sinusitis. Pupils are equally round and reactive to light. Extraocular movements were fully intact, visual field were full on confrontational test.  Facial sensation and strength were normal. Uvula and  tongue move midline.  Head turning and shoulder shrug  were normal and symmetric. Motor: Full strength of all 4 extremities. Symmetric f grip weakness- arthritic finger joints., symmetric motor tone is noted throughout.  Sensory: Sensory testing is intact to soft touch on all 4 extremities. No evidence of extinction is noted.  Coordination:  finger-nose bilaterally intact.  Gait and station: Gait is wider based.  Reflexes: Deep tendon reflexes are symmetric bilaterally.   DIAGNOSTIC DATA (LABS, IMAGING, TESTING) - I reviewed patient records, labs, notes, testing and imaging myself where available.  Lab Results  Component Value Date   WBC 5.6 04/15/2017   HGB 12.4 04/15/2017   HCT 38.7 04/15/2017   MCV 88 04/15/2017   PLT 234 04/15/2017      Component Value Date/Time   NA 135 04/15/2017 0840   NA 136 06/24/2014 0757   K 3.9 04/15/2017 0840   K 4.3 06/24/2014 0757   CL 96 04/15/2017 0840   CL 104 08/27/2012 1219   CO2 22 04/15/2017 0840   CO2 25 06/24/2014 0757   GLUCOSE 144 (H) 04/15/2017 0840   GLUCOSE 104 (H) 12/22/2015 0940   GLUCOSE 164 (H) 06/24/2014 0757   GLUCOSE 142 (H) 08/27/2012 1219   BUN 12 04/15/2017 0840   BUN 14.3 06/24/2014 0757   CREATININE  0.73 04/15/2017 0840   CREATININE 0.77 12/22/2015 0940   CREATININE 0.8 06/24/2014 0757   CALCIUM 9.6 04/15/2017 0840   CALCIUM 9.8 06/24/2014 0757   PROT 7.0 04/15/2017 0840   PROT 7.2 06/24/2014 0757   ALBUMIN 4.1 04/15/2017 0840   ALBUMIN 3.8 06/24/2014 0757   AST 36 04/15/2017 0840   AST 16 06/24/2014 0757   ALT 22 04/15/2017 0840   ALT 19 06/24/2014 0757   ALKPHOS 86 04/15/2017 0840   ALKPHOS 157 (H) 06/24/2014 0757   BILITOT 0.6 04/15/2017 0840   BILITOT 0.45 06/24/2014 0757   GFRNONAA 83 04/15/2017 0840   GFRNONAA 88 05/09/2015 1050   GFRAA 95 04/15/2017 0840   GFRAA >89 05/09/2015 1050   Lab Results  Component Value Date   CHOL 209 (H) 04/15/2017   HDL 47  04/15/2017   LDLCALC 134 (H) 04/15/2017   TRIG 138 04/15/2017   CHOLHDL 4.4 04/15/2017   Lab Results  Component Value Date   HGBA1C 6.8 04/15/2017    Lab Results  Component Value Date   TSH 1.570 12/11/2016      ASSESSMENT AND PLAN 72 y.o. year old female  has a past medical history of Allergy, Anemia, Arthritis, Breast cancer ([place]) (08/2012), Dental crowns present, Fibroids, Headache(784.0), History of endometriosis, Hyperlipidemia, Hypertension, OSA (obstructive sleep apnea), Postmenopausal bleeding, S/P radiation therapy (10/20/2012-12/05/2012), Seasonal allergies, Sleep apnea, Urinary urgency, and White coat hypertension. here with:  1. Obstructive sleep apnea on CPAP. Obesity , BMI 41 2. Left Breast cancer survivor, status post radiation.  3  chronic sciatica, left   RV yearly with CPAP download. Referral to medical weight management .   Larey Seat, MD  05/09/2017, 8:35 AM [facility] [address], [place],  [address] [phone]

## 2017-05-09 NOTE — Progress Notes (Signed)
Patient Care Team: [name], MD as PCP - General (Family Medicine) [name], MD (Family Medicine)  DIAGNOSIS:  Encounter Diagnosis  Name Primary?  . Malignant neoplasm of central portion of left breast in female, estrogen receptor positive ([name])     SUMMARY OF ONCOLOGIC HISTORY:   Cancer of central portion of left female breast ([name])   08/13/2012 Mammogram    Questionable mass deep to the nipple superior periareolar region with calcifications.U/S 5 x 8 mm mass at 12:00, ovoid cystic lesion at 11:00 1.5X 0.4 cm      08/25/2012 Initial Diagnosis    Invasive mammary cancer with mammary cancer in situ grade 2 with lobular features ER 100%, PR 63%, Ki-67 58%, HER-2 positive ratio 2.48      08/26/2012 Breast MRI    Dominant, biopsied mass with small immediately adjacent satellite mass consistent with biopsy results indicating malignancy, measuring 17 x 11 x 92m      09/03/2012 Surgery    Left breast lumpectomy invasive lobular cancer with lymphovascular invasion and lobular carcinoma in situ positive margins one SLN negative, grade 2; 1.6 cm, ER 100%, PR 63%, HER-2 positive, Ki-67 58% T1 C. N0 M0 stage IA      10/21/2012 - 11/28/2012 Radiation Therapy    Radiation therapy to the lumpectomy site      12/01/2012 -  Anti-estrogen oral therapy    Arimidex 1 mg daily planned duration of treatment 5 years, switched to letrozole January 2016 for musculoskeletal aches and pains, switched to Tamoxifen 07/07/15      CHIEF COMPLIANT: Follow-up on tamoxifen therapy  INTERVAL HISTORY: BBRANDEN VINEis a 72year old with above-mentioned history of left breast cancer who is currently on tamoxifen therapy because she did not tolerate anastrozole or letrozole because of musculoskeletal aches and pains.  She appears to be tolerating tamoxifen much better.  However she is worried about the cost of the drug.  She is being $55 every 3 months.  She is starting to take the tablet every other  day because of the cost.  REVIEW OF SYSTEMS:   Constitutional: Denies fevers, chills or abnormal weight loss Eyes: Denies blurriness of vision Ears, nose, mouth, throat, and face: Denies mucositis or sore throat Respiratory: Denies cough, dyspnea or wheezes Cardiovascular: Denies palpitation, chest discomfort Gastrointestinal:  Denies nausea, heartburn or change in bowel habits Skin: Denies abnormal skin rashes Lymphatics: Denies new lymphadenopathy or easy bruising Neurological:Denies numbness, tingling or new weaknesses Behavioral/Psych: Mood is stable, no new changes  Extremities: No lower extremity edema Breast:  denies any pain or lumps or nodules in either breasts All other systems were reviewed with the patient and are negative.  I have reviewed the past medical history, past surgical history, social history and family history with the patient and they are unchanged from previous note.  ALLERGIES:  is allergic to adhesive [tape] and latex.  MEDICATIONS:  Current Outpatient Medications  Medication Sig Dispense Refill  . albuterol (PROVENTIL HFA;VENTOLIN HFA) 108 (90 Base) MCG/ACT inhaler Inhale 2 puffs into the lungs every 6 (six) hours as needed for wheezing or shortness of breath.    .Marland Kitchenaspirin 81 MG tablet Take 81 mg by mouth daily.    . benzonatate (TESSALON) 100 MG capsule Take 1-2 capsules by mouth every 8 hours as needed for cough 20 capsule 0  . blood glucose meter kit and supplies KIT Test blood sugar once daily. Dx code: E11.9 1 each 0  . cholecalciferol (VITAMIN  D) 1000 units tablet Take 1,000 Units by mouth daily.    . Cyanocobalamin (VITAMIN B-12 CR PO) Take 500 mcg by mouth daily.     Marland Kitchen glucose blood (ONE TOUCH ULTRA TEST) test strip USE ONE STRIP TO CHECK GLUCOSE ONCE DAILY 100 each 3  . lisinopril-hydrochlorothiazide (PRINZIDE,ZESTORETIC) 10-12.5 MG tablet Take 1 tablet by mouth daily. 90 tablet 1  . lovastatin (MEVACOR) 40 MG tablet Take 1 tablet (40 mg total) by  mouth at bedtime. 90 tablet 1  . metFORMIN (GLUCOPHAGE) 500 MG tablet Take 2 tablets (1,000 mg total) by mouth 2 (two) times daily with a meal. (Patient taking differently: Take 500 mg by mouth 3 (three) times daily. ) 360 tablet 3  . metoprolol succinate (TOPROL-XL) 25 MG 24 hr tablet Take 1 tablet (25 mg total) by mouth daily. 90 tablet 1  . ONETOUCH DELICA LANCETS 42L MISC USE ONE  TO CHECK GLUCOSE ONCE DAILY 100 each 2  . tamoxifen (NOLVADEX) 20 MG tablet TAKE 1 TABLET BY MOUTH ONCE DAILY 180 tablet 0  . vitamin C (ASCORBIC ACID) 500 MG tablet Take 500 mg by mouth daily.      No current facility-administered medications for this visit.     PHYSICAL EXAMINATION: ECOG PERFORMANCE STATUS: 1 - Symptomatic but completely ambulatory  Vitals:   05/09/17 1030  BP: (!) 144/65  Pulse: 86  Resp: 18  Temp: 98.1 F (36.7 C)  SpO2: 99%   Filed Weights   05/09/17 1030  Weight: 224 lb 4.8 oz (101.7 kg)    GENERAL:alert, no distress and comfortable SKIN: skin color, texture, turgor are normal, no rashes or significant lesions EYES: normal, Conjunctiva are pink and non-injected, sclera clear OROPHARYNX:no exudate, no erythema and lips, buccal mucosa, and tongue normal  NECK: supple, thyroid normal size, non-tender, without nodularity LYMPH:  no palpable lymphadenopathy in the cervical, axillary or inguinal LUNGS: clear to auscultation and percussion with normal breathing effort HEART: regular rate & rhythm and no murmurs and no lower extremity edema ABDOMEN:abdomen soft, non-tender and normal bowel sounds MUSCULOSKELETAL:no cyanosis of digits and no clubbing  NEURO: alert & oriented x 3 with fluent speech, no focal motor/sensory deficits EXTREMITIES: No lower extremity edema BREAST: No palpable masses or nodules in either right or left breasts. No palpable axillary supraclavicular or infraclavicular adenopathy no breast tenderness or nipple discharge. (exam performed in the presence of a  chaperone)  LABORATORY DATA:  I have reviewed the data as listed CMP Latest Ref Rng & Units 04/15/2017 12/11/2016 08/01/2016  Glucose 65 - 99 mg/dL 144(H) 138(H) 119(H)  BUN 8 - 27 mg/dL 12 12 11   Creatinine 0.57 - 1.00 mg/dL 0.73 0.79 0.67  Sodium 134 - 144 mmol/L 135 140 140  Potassium 3.5 - 5.2 mmol/L 3.9 4.4 4.7  Chloride 96 - 106 mmol/L 96 96 96  CO2 20 - 29 mmol/L 22 25 23   Calcium 8.7 - 10.3 mg/dL 9.6 10.3 9.8  Total Protein 6.0 - 8.5 g/dL 7.0 7.2 6.9  Total Bilirubin 0.0 - 1.2 mg/dL 0.6 0.3 0.6  Alkaline Phos 39 - 117 IU/L 86 80 64  AST 0 - 40 IU/L 36 20 26  ALT 0 - 32 IU/L 22 16 20     Lab Results  Component Value Date   WBC 5.6 04/15/2017   HGB 12.4 04/15/2017   HCT 38.7 04/15/2017   MCV 88 04/15/2017   PLT 234 04/15/2017   NEUTROABS 3.4 04/15/2017    ASSESSMENT & PLAN:  Cancer of central portion of left female breast Left breast invasive lobular cancerER/PR positive HER-2 negative (initial biopsy showed HER-2 positive but final pathology on the lumpectomy did not) status post left breast lumpectomy and radiation and currently on antiestrogen therapy with Arimidex started 12/01/2012.  Tamoxifen toxicities: Marked improvement in her ability to tolerate the medication. Denies any significant hot flashes. She does have muscle aches especially in the lower back but they're not as bad as before.  Right humerus fracture: after fall. Much improved.  Breast cancer surveillance: 1. Breast exam  05/09/2017 is normal 2. Mammogram 12/20/2016 is normal breast density category B 3. Bone density June 2016: T score -1.4  Hypercalcemia:She has had episodic hypercalcemia. PTH level was normal April 2016 (52).  I encouraged the patient to exercise regularly.  She has not been exercising at all. I informed her that the cause of tamoxifen was $25 at the [place].  She was happy to hear that. When she is ready for renewal she will likely pick it up at [facility]. Return to clinic in 1 year for follow-up.   I spent 25 minutes talking to the patient of which more than half was spent in counseling and coordination of care.  No orders of the defined types were placed in this encounter.  The patient has a good understanding of the overall plan. she agrees with it. she will call with any problems that may develop before the next visit here.   Harriette Ohara, MD 05/09/17

## 2017-05-09 NOTE — Telephone Encounter (Signed)
I called the patient to make her aware that her referral has been sent to Dr. Leafy Ro and give her the phone number to their office. She did not answer so I left her a VM asking her to call me back. If she calls back please inform her of this and give her their number. [phone].

## 2017-05-15 NOTE — Telephone Encounter (Signed)
Pt returning call aware of what was noted

## 2017-07-17 ENCOUNTER — Ambulatory Visit: Payer: Medicare Other | Admitting: Family Medicine

## 2017-07-29 ENCOUNTER — Ambulatory Visit (INDEPENDENT_AMBULATORY_CARE_PROVIDER_SITE_OTHER): Payer: Medicare Other | Admitting: Family Medicine

## 2017-07-29 ENCOUNTER — Other Ambulatory Visit: Payer: Self-pay

## 2017-07-29 ENCOUNTER — Encounter: Payer: Self-pay | Admitting: Family Medicine

## 2017-07-29 VITALS — BP 132/72 | HR 92 | Temp 98.0°F | Resp 16 | Ht 61.81 in | Wt 221.0 lb

## 2017-07-29 DIAGNOSIS — R0989 Other specified symptoms and signs involving the circulatory and respiratory systems: Secondary | ICD-10-CM

## 2017-07-29 DIAGNOSIS — J301 Allergic rhinitis due to pollen: Secondary | ICD-10-CM

## 2017-07-29 DIAGNOSIS — K591 Functional diarrhea: Secondary | ICD-10-CM

## 2017-07-29 DIAGNOSIS — Z9989 Dependence on other enabling machines and devices: Secondary | ICD-10-CM | POA: Diagnosis not present

## 2017-07-29 DIAGNOSIS — R42 Dizziness and giddiness: Secondary | ICD-10-CM

## 2017-07-29 DIAGNOSIS — E119 Type 2 diabetes mellitus without complications: Secondary | ICD-10-CM | POA: Diagnosis not present

## 2017-07-29 DIAGNOSIS — R059 Cough, unspecified: Secondary | ICD-10-CM

## 2017-07-29 DIAGNOSIS — E7800 Pure hypercholesterolemia, unspecified: Secondary | ICD-10-CM | POA: Diagnosis not present

## 2017-07-29 DIAGNOSIS — R05 Cough: Secondary | ICD-10-CM

## 2017-07-29 DIAGNOSIS — E559 Vitamin D deficiency, unspecified: Secondary | ICD-10-CM

## 2017-07-29 DIAGNOSIS — I10 Essential (primary) hypertension: Secondary | ICD-10-CM | POA: Diagnosis not present

## 2017-07-29 DIAGNOSIS — G4733 Obstructive sleep apnea (adult) (pediatric): Secondary | ICD-10-CM

## 2017-07-29 LAB — POCT GLYCOSYLATED HEMOGLOBIN (HGB A1C): Hemoglobin A1C: 7.2

## 2017-07-29 LAB — GLUCOSE, POCT (MANUAL RESULT ENTRY): POC Glucose: 122 mg/dl — AB (ref 70–99)

## 2017-07-29 MED ORDER — LOVASTATIN 40 MG PO TABS
40.0000 mg | ORAL_TABLET | Freq: Every day | ORAL | 1 refills | Status: DC
Start: 2017-07-29 — End: 2018-09-09

## 2017-07-29 MED ORDER — METOPROLOL SUCCINATE ER 25 MG PO TB24: 25.0000 mg | ORAL_TABLET | Freq: Every day | ORAL | 1 refills | Status: DC

## 2017-07-29 NOTE — Patient Instructions (Signed)
     IF you received an x-ray today, you will receive an invoice from [facility]. Please contact [facility] at [phone] with questions or concerns regarding your invoice.   IF you received labwork today, you will receive an invoice from [facility]. Please contact [facility] at [phone] with questions or concerns regarding your invoice.   Our billing staff will not be able to assist you with questions regarding bills from these companies.  You will be contacted with the lab results as soon as they are available. The fastest way to get your results is to activate your My Chart account. Instructions are located on the last page of this paperwork. If you have not heard from us regarding the results in 2 weeks, please contact this office.     

## 2017-07-29 NOTE — Progress Notes (Signed)
Subjective:    Patient ID: [name], female    DOB: 15-Jul-1945, 72 y.o.   MRN: [id number]  07/29/2017  Chronic Conditions (3 month follow-up ); Diabetes; Hypertension; Hyperlipidemia; and Cough    HPI This 71 y.o. female presents for three month follow-up of DMII, hypertension, hypercholesterolemia. Management changes at last visit.  Worsening controlled diabetes mellitus type 2 due to noncompliance with diet, exercise, metformin twice daily dosing.  He should frequently forgetting evening metformin dose.  Encouraged improved compliance with his evening metformin dose.  Also encouraged regular exercise, dietary modification, weight loss. Hypertension with borderline control in office at home readings normal in the 120s over 70s.  No change to regimen at this time.  Obtain labs for chronic disease management. Hypercholesterolemia uncontrolled due to noncompliance with lovastatin therapy.  I reviewed current guidelines recommending all diabetics be maintained on statin therapy.  LDL currently uncontrolled at 130.  Repeat this today. Status post bone density scan with gynecology.  Mild osteopenia present.  Recommend calcium 600 mg twice daily or 3 servings of dairy daily.  Recommend vitamin D 1000 international units daily.  Recommend daily weight bearing exercise for 30 minutes.  Repeat bone density scan in 2 years. Her cough is persistent despite 67-monthtrial of Prilosec therapy.  We have also switched lisinopril therapy to a different agent without improvement in cough.  Patient suffering with significant postnasal drainage and nasal congestion today with frequent throat clearing during exam.  Patient compliant with loratadine therapy.  Patient refuses nasal spray therapy.  Patient refuses addition of Singulair therapy. Fatigue: New onset.  Patient reports compliance with CPAP therapy.  Currently not exercising.  TSH in September 2018 normal.  Most likely multifactorial due to insomnia,  ongoing grief from death of son in the past year, lack of exercise.  Highly encouraged daily exercise regimen for insomnia therapy, grief therapy, fatigue therapy. Vitamin D level is slightly low at 26. I recommend increasing your daily vitamin D supplementation by an additional 800 international units. No evidence of anemia. Glucose/sugar is elevated at 144. Hemoglobin A1c has increased from 6.3-6.8. Kidney function is normal. Liver function is normal. Cholesterol remains elevated. I highly recommend taking your cholesterol medication daily.  UPDATE:  Dizziness today; had diarrhea all day yesterday; two severe episodes yesterday.  Has had one episode of diarrhea this morning.  Drinking some water this morning. Yellow stool. Chills.  No fever.  No nausea or vomiting. Might be sugar free lemon drops this time.  Has had diarrhea the past three times with lemon drops. PND severe; drops help with PND cough. Went to [employer] refreshments; cashews; almonds.  Two halfs of peanuts.  Started giggling; choked badly; wheezing; SOB.  Coughing to clear peanut.  Can tolerate peanut butter.  Not checking sugar; daughter giving glucometer to patient.  Daughter with [employer].   Checking BP; always good.  Highest 124/63.   Still forgetting cholesterol medication at night. Has drank 36 ounces of water this morning.  BP Readings from Last 3 Encounters:  09/03/17 130/60  08/27/17 134/60  07/29/17 132/72   Wt Readings from Last 3 Encounters:  09/03/17 227 lb 3.2 oz (103.1 kg)  08/27/17 226 lb 6.4 oz (102.7 kg)  07/29/17 221 lb (100.2 kg)   Immunization History  Administered Date(s) Administered  . Influenza Split 12/28/2011, 02/23/2013, 02/05/2014  . Influenza,inj,Quad PF,6+ Mos 05/31/2015  . Influenza-Unspecified 02/12/2017  . Pneumococcal Conjugate-13 11/16/2013  . Pneumococcal Polysaccharide-23 02/20/2010, 04/15/2017  . Tdap  11/17/2005  . Zoster 02/16/2010    Review of Systems    Constitutional: Positive for fatigue. Negative for activity change, appetite change, chills, diaphoresis, fever and unexpected weight change.  HENT: Negative for congestion, dental problem, drooling, ear discharge, ear pain, facial swelling, hearing loss, mouth sores, nosebleeds, postnasal drip, rhinorrhea, sinus pressure, sneezing, sore throat, tinnitus, trouble swallowing and voice change.   Eyes: Negative for photophobia, pain, discharge, redness, itching and visual disturbance.  Respiratory: Positive for cough and choking. Negative for apnea, chest tightness, shortness of breath, wheezing and stridor.   Cardiovascular: Negative for chest pain, palpitations and leg swelling.  Gastrointestinal: Positive for diarrhea. Negative for abdominal distention, abdominal pain, anal bleeding, blood in stool, constipation, nausea, rectal pain and vomiting.  Endocrine: Negative for cold intolerance, heat intolerance, polydipsia, polyphagia and polyuria.  Genitourinary: Negative for decreased urine volume, difficulty urinating, dyspareunia, dysuria, enuresis, flank pain, frequency, genital sores, hematuria, menstrual problem, pelvic pain, urgency, vaginal bleeding, vaginal discharge and vaginal pain.  Musculoskeletal: Negative for arthralgias, back pain, gait problem, joint swelling, myalgias, neck pain and neck stiffness.  Skin: Negative for color change, pallor, rash and wound.  Allergic/Immunologic: Negative for environmental allergies, food allergies and immunocompromised state.  Neurological: Negative for dizziness, tremors, seizures, syncope, facial asymmetry, speech difficulty, weakness, light-headedness, numbness and headaches.  Hematological: Negative for adenopathy. Does not bruise/bleed easily.  Psychiatric/Behavioral: Positive for dysphoric mood. Negative for agitation, behavioral problems, confusion, decreased concentration, hallucinations, self-injury, sleep disturbance and suicidal ideas. The  patient is not nervous/anxious and is not hyperactive.     Past Medical History:  Diagnosis Date  . Allergic rhinitis 03/23/2013  . Allergy   . Anemia    due to vaginal bleeding  . Arthritis    hands  . Asthmatic bronchitis 09/03/2017  . Breast cancer ([facility]) 08/2012   left  . Dental crowns present   . Eczema   . Fibroids   . Headache(784.0)    tension  . History of endometriosis   . Hyperlipidemia   . Hypertension    under control with med., has been on med. x 3 yr.  . OSA (obstructive sleep apnea)   . Postmenopausal bleeding   . Recurrent upper respiratory infection (URI)   . S/P radiation therapy 10/20/2012-12/05/2012   left breast 50.4 gray, lumpectomy cavity boosted to 62.4 gray  . Seasonal allergies   . Sleep apnea    uses CPAP nightly  . Urinary urgency   . White coat hypertension    Past Surgical History:  Procedure Laterality Date  . BACK SURGERY  1997   lumbar  . BREAST LUMPECTOMY WITH NEEDLE LOCALIZATION AND AXILLARY SENTINEL LYMPH NODE BX Left 09/03/2012   Procedure: BREAST LUMPECTOMY WITH NEEDLE LOCALIZATION AND AXILLARY SENTINEL LYMPH NODE BX;  Surgeon: Edward Jolly, MD;  Location: [facility];  Service: General;  Laterality: Left;  . CHOLECYSTECTOMY  1995  . COLONOSCOPY W/ POLYPECTOMY  03/20/2007  . DILATION AND CURETTAGE OF UTERUS  2005  . DILATION AND CURETTAGE OF UTERUS  06/22/2011   Procedure: DILATATION AND CURETTAGE;  Surgeon: Alvino Chapel, MD;  Location: [facility];  Service: Gynecology;  Laterality: N/A;  OK PER KEELA FOR 7:15 START  . DORSAL COMPARTMENT RELEASE Right 05/19/2013   Procedure: RELEASE 1ST  DORSAL COMPARTMENT RIGHT (DEQUERVAIN);  Surgeon: Cammie Sickle., MD;  Location: [facility];  Service: Orthopedics;  Laterality: Right;  . EXPLORATORY LAPAROTOMY  06-30-2007   ATTEMPTED HYSTERECTOMY ABORTED  DUE TO EXTENSIVE ENDOMETRIOSIS W/ DENSE PELVIC ADHESIONS INVOLVING UTERUS AND LOWER  RECTOSIGMOID  . HYSTEROSCOPY W/D&C  05/06/2009   Allergies  Allergen Reactions  . Adhesive [Tape] Rash  . Latex Itching and Other (See Comments)    Causes blisters   Current Outpatient Medications on File Prior to Visit  Medication Sig Dispense Refill  . aspirin 81 MG tablet Take 81 mg by mouth daily.    . blood glucose meter kit and supplies KIT Test blood sugar once daily. Dx code: E11.9 1 each 0  . cholecalciferol (VITAMIN D) 1000 units tablet Take 1,000 Units by mouth daily.    . Cyanocobalamin (VITAMIN B-12 CR PO) Take 500 mcg by mouth daily.     . metFORMIN (GLUCOPHAGE) 500 MG tablet Take 2 tablets (1,000 mg total) by mouth 2 (two) times daily with a meal. (Patient taking differently: Take 500 mg by mouth 3 (three) times daily. ) 360 tablet 3  . tamoxifen (NOLVADEX) 20 MG tablet TAKE 1 TABLET BY MOUTH ONCE DAILY 180 tablet 0  . vitamin C (ASCORBIC ACID) 500 MG tablet Take 500 mg by mouth daily.     . [DISCONTINUED] LISINOPRIL PO Take 1 tablet by mouth daily.     No current facility-administered medications on file prior to visit.    Social History   Socioeconomic History  . Marital status: Married    Spouse name: [name]  . Number of children: 4  . Years of education: College  . Highest education level: Not on file  Occupational History  . Occupation: retired  Scientific laboratory technician  . Financial resource strain: Not on file  . Food insecurity:    Worry: Not on file    Inability: Not on file  . Transportation needs:    Medical: Not on file    Non-medical: Not on file  Tobacco Use  . Smoking status: Never Smoker  . Smokeless tobacco: Never Used  Substance and Sexual Activity  . Alcohol use: No    Alcohol/week: 0.0 oz  . Drug use: No  . Sexual activity: Yes    Comment: menarche 5, P4, Premarin for short while, Provera x 3 years  Lifestyle  . Physical activity:    Days per week: Not on file    Minutes per session: Not on file  . Stress: Not on file  Relationships  .  Social connections:    Talks on phone: Not on file    Gets together: Not on file    Attends religious service: Not on file    Active member of club or organization: Not on file    Attends meetings of clubs or organizations: Not on file    Relationship status: Not on file  . Intimate partner violence:    Fear of current or ex partner: Not on file    Emotionally abused: Not on file    Physically abused: Not on file    Forced sexual activity: Not on file  Other Topics Concern  . Not on file  Social History Narrative   Marital status: married x 46  years; happily married; no abuse.      Children: 4 children (2 sons living; 1 son died in 239 at age 39years old, 1 daughter); six grandchildren; no gg.      Lives:  With husband.      Employed: retired in 2011; [employer].      Tobacco: none       Alcohol:  None  Drugs: none      Exercise:  Rowing 3 days per week.      Seatbelt:  100%      Sunscreen:  Face SPF 15.      Guns:  Loaded mostly secured.      ADLs:  Drives minimally; no assistant devices; independent with all ADLs.       Advanced Directives: YES; FULL CODE but no prolonged measures.      Her nocturia is improved under CPAP use at 10 cm water- AHI is now 0.8 and from 30 at baseline. CMS compliance  6 hours 40 minutes - sleeps on the side, 08-10-11 .FFM - likes it.    . Flonase used prn.     Caffeine Use: 1 cup daily   Family History  Problem Relation Age of Onset  . Vision loss Mother   . Hypertension Mother   . Anesthesia problems Mother        post-op N/V  . Heart disease Mother        carotid stenosis s/p CAE  . Osteoporosis Mother   . Eczema Mother   . Depression Father   . Heart disease Sister 18       arrhythmia  . Hypertension Sister   . Hyperlipidemia Sister   . Diabetes Brother   . Hypertension Brother   . Cancer Brother        skin  . Hyperlipidemia Brother   . Stroke Brother   . COPD Daughter   . Atopy Daughter   . Cancer  Cousin   . Kidney failure Maternal Grandmother   . Heart disease Maternal Grandfather   . Hypertension Other   . Colon cancer Neg Hx   . Allergic rhinitis Neg Hx   . Asthma Neg Hx   . Urticaria Neg Hx        Objective:    BP 132/72   Pulse 92   Temp 98 F (36.7 C) (Oral)   Resp 16   Ht 5' 1.81" (1.57 m)   Wt 221 lb (100.2 kg)   SpO2 95%   BMI 40.67 kg/m  Physical Exam  Constitutional: She is oriented to person, place, and time. She appears well-developed and well-nourished. No distress.  HENT:  Head: Normocephalic and atraumatic.  Right Ear: External ear normal.  Left Ear: External ear normal.  Nose: Nose normal.  Mouth/Throat: Oropharynx is clear and moist.  Eyes: Pupils are equal, round, and reactive to light. Conjunctivae and EOM are normal.  Neck: Normal range of motion. Neck supple. Carotid bruit is not present. No thyromegaly present.  Cardiovascular: Normal rate, regular rhythm, normal heart sounds and intact distal pulses. Exam reveals no gallop and no friction rub.  No murmur heard. Pulmonary/Chest: Effort normal and breath sounds normal. She has no wheezes. She has no rales.  Abdominal: Soft. Bowel sounds are normal. She exhibits no distension and no mass. There is no tenderness. There is no rebound and no guarding.  Lymphadenopathy:    She has no cervical adenopathy.  Neurological: She is alert and oriented to person, place, and time. No cranial nerve deficit.  Skin: Skin is warm and dry. No rash noted. She is not diaphoretic. No erythema. No pallor.  Psychiatric: She has a normal mood and affect. Her behavior is normal.   No results found. Depression screen [facility] 2/9 08/27/2017 07/29/2017 04/22/2017 04/15/2017 12/11/2016  Decreased Interest 0 0 0 0 0  Down, Depressed, Hopeless 0 0 0 0 0  PHQ -  2 Score 0 0 0 0 0   Fall Risk  08/27/2017 07/29/2017 04/22/2017 04/15/2017 12/11/2016  Falls in the past year? _0   Number falls in past yr: - - - - -           Assessment & Plan:   1. Type 2 diabetes mellitus without complication, without long-term current use of insulin ([facility])   2. Essential hypertension   3. Pure hypercholesterolemia   4. Obstructive sleep apnea on CPAP   5. Vitamin D deficiency   6. Cough   7. Choking episode   8. Seasonal allergic rhinitis due to pollen   9. Functional diarrhea   10. Dizziness     Diabetes mellitus type 2: Improved control.  Continue current medications.  Highly recommend exercise and weight loss.  Hypertension: Well-controlled at this time.  Suffering with intermittent dizziness.  Obtain labs for chronic disease management.  Allergic rhinitis with continuous cough and postnasal drainage: Despite allergy medication.  Refer to allergy for allergy testing.  Cough: Persistent.  No improvement with holding ACE inhibitor.  No improvement with allergy medication or GERD medication.  Refer to allergy and immunology for further evaluation.  Choking episode: Occurred with ingestion of peanuts.  Concern for allergy to peanuts.  Refer to allergy.  Functional diarrhea: Recurrent issue for patient.  Obtain labs.  Colonoscopy up-to-date.  Consider referral to GI if persistent.  Obstructive sleep apnea with CPAP: Status post recent follow-up with Dr. Brett Fairy of sleep medicine in February 2019.  Compliance with CPAP therapy.  Orders Placed This Encounter  Procedures  . CBC with Differential/Platelet  . Comprehensive metabolic panel    Order Specific Question:   Has the patient fasted?    Answer:   No  . Lipid panel    Order Specific Question:   Has the patient fasted?    Answer:   No  . Vitamin D, 25-hydroxy  . Ambulatory referral to Allergy    Referral Priority:   Routine    Referral Type:   Allergy Testing    Referral Reason:   Specialty Services Required    Requested Specialty:   Allergy    Number of Visits Requested:   1  . POCT glucose (manual entry)  . POCT glycosylated hemoglobin (Hb A1C)   Meds  ordered this encounter  Medications  . DISCONTD: metoprolol succinate (TOPROL-XL) 25 MG 24 hr tablet    Sig: Take 1 tablet (25 mg total) by mouth daily.    Dispense:  90 tablet    Refill:  1  . lovastatin (MEVACOR) 40 MG tablet    Sig: Take 1 tablet (40 mg total) by mouth at bedtime.    Dispense:  90 tablet    Refill:  1  . glucose blood (ONE TOUCH ULTRA TEST) test strip    Sig: USE ONE STRIP TO CHECK GLUCOSE ONCE DAILY    Dispense:  100 each    Refill:  3    ICD-10  E11.9  . lisinopril-hydrochlorothiazide (PRINZIDE,ZESTORETIC) 10-12.5 MG tablet    Sig: Take 1 tablet by mouth daily.    Dispense:  90 tablet    Refill:  1  . ONETOUCH DELICA LANCETS 53Z MISC    Sig: USE ONE  TO CHECK GLUCOSE ONCE DAILY    Dispense:  100 each    Refill:  2    ICD 10   E11.9    Return in about 3 months (around 10/29/2017) for follow-up chronic medical  conditions [place].   [name] Elayne Guerin, M.D. Primary Care at [facility] previously [facility] [place] [address] [place], [place]  [address] [phone] phone [phone] fax

## 2017-07-30 LAB — CBC WITH DIFFERENTIAL/PLATELET
BASOS ABS: 0 10*3/uL (ref 0.0–0.2)
Basos: 0 %
EOS (ABSOLUTE): 0.2 10*3/uL (ref 0.0–0.4)
Eos: 3 %
Hematocrit: 38.1 % (ref 34.0–46.6)
Hemoglobin: 13 g/dL (ref 11.1–15.9)
IMMATURE GRANS (ABS): 0 10*3/uL (ref 0.0–0.1)
IMMATURE GRANULOCYTES: 0 %
LYMPHS: 29 %
Lymphocytes Absolute: 1.6 10*3/uL (ref 0.7–3.1)
MCH: 29.8 pg (ref 26.6–33.0)
MCHC: 34.1 g/dL (ref 31.5–35.7)
MCV: 87 fL (ref 79–97)
Monocytes Absolute: 0.4 10*3/uL (ref 0.1–0.9)
Monocytes: 8 %
NEUTROS PCT: 60 %
Neutrophils Absolute: 3.3 10*3/uL (ref 1.4–7.0)
PLATELETS: 228 10*3/uL (ref 150–379)
RBC: 4.36 x10E6/uL (ref 3.77–5.28)
RDW: 13.3 % (ref 12.3–15.4)
WBC: 5.5 10*3/uL (ref 3.4–10.8)

## 2017-07-30 LAB — COMPREHENSIVE METABOLIC PANEL
ALK PHOS: 74 IU/L (ref 39–117)
ALT: 22 IU/L (ref 0–32)
AST: 38 IU/L (ref 0–40)
Albumin/Globulin Ratio: 1.5 (ref 1.2–2.2)
Albumin: 4.2 g/dL (ref 3.5–4.8)
BUN/Creatinine Ratio: 14 (ref 12–28)
BUN: 11 mg/dL (ref 8–27)
Bilirubin Total: 0.6 mg/dL (ref 0.0–1.2)
CALCIUM: 9.6 mg/dL (ref 8.7–10.3)
CO2: 24 mmol/L (ref 20–29)
CREATININE: 0.79 mg/dL (ref 0.57–1.00)
Chloride: 98 mmol/L (ref 96–106)
GFR calc Af Amer: 86 mL/min/{1.73_m2} (ref >59)
GFR, EST NON AFRICAN AMERICAN: 75 mL/min/{1.73_m2} (ref >59)
GLUCOSE: 128 mg/dL — AB (ref 65–99)
Globulin, Total: 2.8 g/dL (ref 1.5–4.5)
Potassium: 4.1 mmol/L (ref 3.5–5.2)
Sodium: 137 mmol/L (ref 134–144)
Total Protein: 7 g/dL (ref 6.0–8.5)

## 2017-07-30 LAB — LIPID PANEL
CHOL/HDL RATIO: 4.4 ratio (ref 0.0–4.4)
Cholesterol, Total: 226 mg/dL — ABNORMAL HIGH (ref 100–199)
HDL: 51 mg/dL (ref >39)
LDL CALC: 148 mg/dL — AB (ref 0–99)
TRIGLYCERIDES: 135 mg/dL (ref 0–149)
VLDL CHOLESTEROL CAL: 27 mg/dL (ref 5–40)

## 2017-07-30 LAB — VITAMIN D 25 HYDROXY (VIT D DEFICIENCY, FRACTURES): Vit D, 25-Hydroxy: 29.6 ng/mL — ABNORMAL LOW (ref 30.0–100.0)

## 2017-08-14 ENCOUNTER — Encounter: Payer: Self-pay | Admitting: Family Medicine

## 2017-08-19 DIAGNOSIS — R0602 Shortness of breath: Secondary | ICD-10-CM | POA: Diagnosis not present

## 2017-08-19 DIAGNOSIS — I10 Essential (primary) hypertension: Secondary | ICD-10-CM | POA: Diagnosis not present

## 2017-08-19 DIAGNOSIS — I471 Supraventricular tachycardia: Secondary | ICD-10-CM | POA: Diagnosis not present

## 2017-08-19 DIAGNOSIS — I493 Ventricular premature depolarization: Secondary | ICD-10-CM | POA: Diagnosis not present

## 2017-08-27 ENCOUNTER — Ambulatory Visit (INDEPENDENT_AMBULATORY_CARE_PROVIDER_SITE_OTHER): Payer: Medicare Other | Admitting: Family Medicine

## 2017-08-27 ENCOUNTER — Other Ambulatory Visit: Payer: Self-pay

## 2017-08-27 ENCOUNTER — Ambulatory Visit (INDEPENDENT_AMBULATORY_CARE_PROVIDER_SITE_OTHER): Payer: Medicare Other

## 2017-08-27 ENCOUNTER — Encounter: Payer: Self-pay | Admitting: Family Medicine

## 2017-08-27 VITALS — BP 134/60 | HR 82 | Temp 98.2°F | Ht 61.0 in | Wt 226.4 lb

## 2017-08-27 DIAGNOSIS — S90122A Contusion of left lesser toe(s) without damage to nail, initial encounter: Secondary | ICD-10-CM | POA: Diagnosis not present

## 2017-08-27 DIAGNOSIS — S99922A Unspecified injury of left foot, initial encounter: Secondary | ICD-10-CM | POA: Diagnosis not present

## 2017-08-27 NOTE — Progress Notes (Signed)
Subjective:  By signing my name below, I, [name], attest that this documentation has been prepared under the direction and in the presence of Diane Ray, MD. Electronically Signed: [name], [place]. 08/27/2017 , 11:22 AM .  Patient was seen in Room 8 .   Patient ID: [name], female    DOB: 15-Aug-1945, 72 y.o.   MRN: [id number] Chief Complaint  Patient presents with  . left leg swelling    stubbed left middle toe last night. Left leg and feet swelling.    HPI [name] is a 72 y.o. female Here for left leg and foot swelling. Patient states she was sitting at her computer in her chair, and she was trying to sit up more. When pushing herself up, her left foot slipped and hit the edge of the base for a computer table. She believes she only hit her middle toe of her left foot, as it was most painful. When she's walking, her whole foot hurts. She noticed foot swollen this morning, but usually swells during the day. She notes usually putting on compression socks but not today. She denies taking any medication for this.   Patient Active Problem List   Diagnosis Date Noted  . Spinal stenosis of lumbar region with neurogenic claudication 08/01/2016  . Pure hypercholesterolemia 08/01/2016  . Osteopenia 10/18/2014  . Obesity (BMI 30-39.9) 06/22/2014  . Allergic rhinitis 03/23/2013  . Cancer of central portion of left female breast ([place]) 08/25/2012  . Obstructive sleep apnea on CPAP 04/21/2012  . Diabetes ([place]) 12/28/2011  . HTN (hypertension) 12/28/2011  . Uterine bleeding 04/30/2011   Past Medical History:  Diagnosis Date  . Allergy   . Anemia    due to vaginal bleeding  . Arthritis    hands  . Breast cancer ([place]) 08/2012   left  . Dental crowns present   . Fibroids   . Headache(784.0)    tension  . History of endometriosis   . Hyperlipidemia   . Hypertension    under control with med., has been on med. x 3 yr.  . OSA (obstructive sleep apnea)   .  Postmenopausal bleeding   . S/P radiation therapy 10/20/2012-12/05/2012   left breast 50.4 gray, lumpectomy cavity boosted to 62.4 gray  . Seasonal allergies   . Sleep apnea    uses CPAP nightly  . Urinary urgency   . White coat hypertension    Past Surgical History:  Procedure Laterality Date  . BACK SURGERY  1997   lumbar  . BREAST LUMPECTOMY WITH NEEDLE LOCALIZATION AND AXILLARY SENTINEL LYMPH NODE BX Left 09/03/2012   Procedure: BREAST LUMPECTOMY WITH NEEDLE LOCALIZATION AND AXILLARY SENTINEL LYMPH NODE BX;  Surgeon: Edward Jolly, MD;  Location: [place];  Service: General;  Laterality: Left;  . CHOLECYSTECTOMY  1995  . COLONOSCOPY W/ POLYPECTOMY  03/20/2007  . DILATION AND CURETTAGE OF UTERUS  2005  . DILATION AND CURETTAGE OF UTERUS  06/22/2011   Procedure: DILATATION AND CURETTAGE;  Surgeon: Alvino Chapel, MD;  Location: [facility];  Service: Gynecology;  Laterality: N/A;  OK PER KEELA FOR 7:15 START  . DORSAL COMPARTMENT RELEASE Right 05/19/2013   Procedure: RELEASE 1ST  DORSAL COMPARTMENT RIGHT (DE[place]AIN);  Surgeon: Cammie Sickle., MD;  Location[facility];  Service: Orthopedics;  Laterality: Right;  . EXPLORATORY LAPAROTOMY  06-30-2007   ATTEMPTED HYSTERECTOMY ABORTED DUE TO EXTENSIVE ENDOMETRIOSIS W/ DENSE PELVIC ADHESIONS INVOLVING UTERUS  AND LOWER RECTOSIGMOID  . HYSTEROSCOPY W/D&C  05/06/2009   Allergies  Allergen Reactions  . Adhesive [Tape] Rash  . Latex Itching and Other (See Comments)    Causes blisters   Prior to Admission medications   Medication Sig Start Date End Date Taking? Authorizing Provider  albuterol (PROVENTIL HFA;VENTOLIN HFA) 108 (90 Base) MCG/ACT inhaler Inhale 2 puffs into the lungs every 6 (six) hours as needed for wheezing or shortness of breath.    [provider]  aspirin 81 MG tablet Take 81 mg by mouth daily.    [provider]  blood glucose meter kit and  supplies KIT Test blood sugar once daily. Dx code: E11.9 06/23/14   [name], MD  cholecalciferol (VITAMIN D) 1000 units tablet Take 1,000 Units by mouth daily.    [provider]  Cyanocobalamin (VITAMIN B-12 CR PO) Take 500 mcg by mouth daily.     [provider]  glucose blood (ONE TOUCH ULTRA TEST) test strip USE ONE STRIP TO CHECK GLUCOSE ONCE DAILY 08/01/16   [name], MD  lisinopril-hydrochlorothiazide (PRINZIDE,ZESTORETIC) 10-12.5 MG tablet Take 1 tablet by mouth daily. 04/15/17   [name], MD  lovastatin (MEVACOR) 40 MG tablet Take 1 tablet (40 mg total) by mouth at bedtime. 07/29/17   [name], MD  metFORMIN (GLUCOPHAGE) 500 MG tablet Take 2 tablets (1,000 mg total) by mouth 2 (two) times daily with a meal. Patient taking differently: Take 500 mg by mouth 3 (three) times daily.  09/08/15   [name], MD  metoprolol succinate (TOPROL-XL) 25 MG 24 hr tablet Take 1 tablet (25 mg total) by mouth daily. 07/29/17   [name], MD  [facility] : [facility] DELICA LANCETS 59D MISC USE ONE  TO CHECK GLUCOSE ONCE DAILY 09/15/15   [name], MD  tamoxifen (NOLVADEX) 20 MG tablet TAKE 1 TABLET BY MOUTH ONCE DAILY 03/25/17   Nicholas Lose, MD  vitamin C (ASCORBIC ACID) 500 MG tablet Take 500 mg by mouth daily.     [provider]  LISINOPRIL PO Take 1 tablet by mouth daily.  06/13/11  [provider]   Social History   Socioeconomic History  . Marital status: Married    Spouse name: [name]  . Number of children: 4  . Years of education: College  . Highest education level: Not on file  Occupational History  . Occupation: retired  Scientific laboratory technician  . Financial resource strain: Not on file  . Food insecurity:    Worry: Not on file    Inability: Not on file  . Transportation needs:    Medical: Not on file    Non-medical: Not on file  Tobacco Use  . Smoking status: Never Smoker  . Smokeless tobacco: Never Used  Substance and Sexual  Activity  . Alcohol use: No    Alcohol/week: 0.0 oz  . Drug use: No  . Sexual activity: Yes    Comment: menarche 10, P4, Premarin for short while, Provera x 3 years  Lifestyle  . Physical activity:    Days per week: Not on file    Minutes per session: Not on file  . Stress: Not on file  Relationships  . Social connections:    Talks on phone: Not on file    Gets together: Not on file    Attends religious service: Not on file    Active member of club or organization: Not on file    Attends meetings  of clubs or organizations: Not on file    Relationship status: Not on file  . Intimate partner violence:    Fear of current or ex partner: Not on file    Emotionally abused: Not on file    Physically abused: Not on file    Forced sexual activity: Not on file  Other Topics Concern  . Not on file  Social History Narrative   Marital status: married x 24  years; happily married; no abuse.      Children: 4 children (2 sons living; 1 son died in 631 at age 69years old, 1 daughter); six grandchildren; no gg.      Lives:  With husband.      Employed: retired in 2011; [employer].      Tobacco: none       Alcohol:  None      Drugs: none      Exercise:  Rowing 3 days per week.      Seatbelt:  100%      Sunscreen:  Face SPF 15.      Guns:  Loaded mostly secured.      ADLs:  Drives minimally; no assistant devices; independent with all ADLs.       Advanced Directives: YES; FULL CODE but no prolonged measures.      Her nocturia is improved under CPAP use at 10 cm water- AHI is now 0.8 and from 30 at baseline. CMS compliance  6 hours 40 minutes - sleeps on the side, 08-10-11 .FFM - likes it.    . Flonase used prn.     Caffeine Use: 1 cup daily   Review of Systems  Constitutional: Negative for chills, fatigue, fever and unexpected weight change.  Respiratory: Negative for cough.   Cardiovascular: Positive for leg swelling (left leg).  Gastrointestinal: Negative for  constipation, diarrhea, nausea and vomiting.  Musculoskeletal: Positive for arthralgias (left 3rd toe), joint swelling and myalgias. Negative for gait problem.  Skin: Negative for color change, rash and wound.  Neurological: Negative for dizziness, weakness, numbness and headaches.       Objective:   Physical Exam  Constitutional: She is oriented to person, place, and time. She appears well-developed and well-nourished. No distress.  HENT:  Head: Normocephalic and atraumatic.  Eyes: Pupils are equal, round, and reactive to light. EOM are normal.  Neck: Neck supple.  Cardiovascular: Normal rate.  Pulmonary/Chest: Effort normal. No respiratory distress.  Musculoskeletal: Normal range of motion. She exhibits edema (pedal edema bilaterally).  Lower extremities: chronic stasis changes, no erythema or wounds Left foot: pain free ROM ankle without bony tenderness, 5th metatarsal non tender, navicula non tender, mid foot non tender, has a Morton's 2nd toe; 3rd toe slight soft tissue swelling but skin appears intact, no erythema, no wound, some tenderness along middle phalanx of her middle toe, no toe deformity; remainder of toes non tender  Neurological: She is alert and oriented to person, place, and time.  Skin: Skin is warm and dry.  Psychiatric: She has a normal mood and affect. Her behavior is normal.  Nursing note and vitals reviewed.   Vitals:   08/27/17 1057  BP: 134/60  Pulse: 82  Temp: 98.2 F (36.8 C)  TempSrc: Oral  SpO2: 95%  Weight: 226 lb 6.4 oz (102.7 kg)  Height: 5' 1"  (1.549 m)   Dg Toe 3rd Left  Result Date: 08/27/2017 CLINICAL DATA:  Blow to the left third toe while sitting at a desk today.  Initial encounter. EXAM: LEFT THIRD TOE COMPARISON:  None. FINDINGS: No acute bony or joint abnormality is identified. Joint space narrowing and osteophytosis about the interphalangeal joints consistent osteoarthritis noted. There is also some degenerative disease of the midfoot.  IMPRESSION: No acute abnormality. Scattered osteoarthritis. Electronically Signed   By: Inge Rise M.D.   On: 08/27/2017 11:41       Assessment & Plan:   MADLYNN LUNDEEN is a 72 y.o. female Contusion of lesser toe of left foot without damage to nail, initial encounter - Plan: DG Toe 3rd Left  -Suspected contusion of third left toe without apparent fracture on x-[name].  No apparent deformity or ecchymosis at present.  Buddy tape placed, symptomatic care discussed with hard soled shoe if available, RTC precautions if persistent discomfort.  No orders of the defined types were placed in this encounter.  Patient Instructions    X-[name] did not indicate any fracture.  Would recommend hard soled shoe for now, buddy tape your 2nd-3rd toe for the next 1 to 2 weeks.  If any persistent discomfort in the next few weeks, can follow-up to discuss other treatment.  I suspect that pain will improve during that time.  Thank you for coming in today.   Foot Contusion A foot contusion is a deep bruise to the foot. Contusions are the result of a blunt injury to tissues and muscle fibers under the skin. The injury causes bleeding under the skin. The skin overlying the contusion may turn blue, purple, or yellow. Minor injuries will give you a painless contusion, but more severe contusions may stay painful and swollen for a few weeks. What are the causes? This condition is usually caused by a hard hit, trauma, or direct force to your foot, such as having a heavy object fall on your foot. What are the signs or symptoms? Symptoms of this condition include:  Swelling of the foot.  Pain and tenderness of the foot.  Discoloration of the foot. The area may have redness and then turn blue, purple, or yellow.  How is this diagnosed? This condition is diagnosed from a physical exam and your medical history. An X-[name] may be needed to see if there are any other injuries, such as broken bones (fractures). Sometimes,  a CT scan or MRI may be needed if your health care provider is concerned that you have torn or injured ligaments. How is this treated? In general, the best treatment for a foot contusion is rest, ice, pressure (compression), and elevation. This is often called RICE therapy. An elastic wrap may be recommended to support your foot. Over-the-counter anti-inflammatory medicines may also be recommended for pain control. If your swelling or pain is severe, you may be given crutches. Follow these instructions at home: [place] the injured area. Try to avoid standing or walking while your foot is painful.  If directed, apply ice to the injured area: ? Put ice in a plastic bag. ? Place a towel between your skin and the bag. ? Leave the ice on for 20 minutes, 2-3 times per day.  If directed, apply light compression to the injured area using an elastic wrap. Make sure the wrap is not too tight. Remove and reapply the wrap as told by your health care provider. If your toes become numb, cold, or blue, take the wrap off and reapply it more loosely.  Raise (elevate) the injured area above the level of your heart while you are sitting or lying down.  General instructions  Take over-the-counter and prescription medicines only as told by your health care provider.  Use crutches as told by your health care provider, if this applies.  Keep all follow-up visits as told by your health care provider. This is important. Contact a health care provider if:  Your symptoms do not improve after several days of treatment.  You have more redness, swelling, or pain in your foot or toes.  You have difficulty moving the injured area.  Your swelling or pain is not relieved with medicines. Get help right away if:  You have severe pain.  Your foot or toes become numb.  Your foot or toes become pale or cold.  You cannot move your foot or ankle.  Your foot is warm to the touch. This information is not  intended to replace advice given to you by your health care provider. Make sure you discuss any questions you have with your health care provider. Document Released: 12/25/2005 Document Revised: 08/11/2015 Document Reviewed: 11/09/2014 Elsevier Interactive Patient Education  2018 [employer].    IF you received an x-[name] today, you will receive an invoice from [facility]. Please contact [facility] at [phone] with questions or concerns regarding your invoice.   IF you received labwork today, you will receive an invoice from [place]. Please contact [facility] at [phone] with questions or concerns regarding your invoice.   Our billing staff will not be able to assist you with questions regarding bills from these companies.  You will be contacted with the lab results as soon as they are available. The fastest way to get your results is to activate your My Chart account. Instructions are located on the last page of this paperwork. If you have not heard from [place] regarding the results in 2 weeks, please contact this office.       I personally performed the services described in this documentation, which was scribed in my presence. The recorded information has been reviewed and considered for accuracy and completeness, addended by me as needed, and agree with information above.  Signed,   Diane Ray, MD [facility] [place].  08/27/17 1:33 PM

## 2017-08-27 NOTE — Patient Instructions (Addendum)
X-ray did not indicate any fracture.  Would recommend hard soled shoe for now, buddy tape your 2nd-3rd toe for the next 1 to 2 weeks.  If any persistent discomfort in the next few weeks, can follow-up to discuss other treatment.  I suspect that pain will improve during that time.  Thank you for coming in today.   Foot Contusion A foot contusion is a deep bruise to the foot. Contusions are the result of a blunt injury to tissues and muscle fibers under the skin. The injury causes bleeding under the skin. The skin overlying the contusion may turn blue, purple, or yellow. Minor injuries will give you a painless contusion, but more severe contusions may stay painful and swollen for a few weeks. What are the causes? This condition is usually caused by a hard hit, trauma, or direct force to your foot, such as having a heavy object fall on your foot. What are the signs or symptoms? Symptoms of this condition include:  Swelling of the foot.  Pain and tenderness of the foot.  Discoloration of the foot. The area may have redness and then turn blue, purple, or yellow.  How is this diagnosed? This condition is diagnosed from a physical exam and your medical history. An X-ray may be needed to see if there are any other injuries, such as broken bones (fractures). Sometimes, a CT scan or MRI may be needed if your health care provider is concerned that you have torn or injured ligaments. How is this treated? In general, the best treatment for a foot contusion is rest, ice, pressure (compression), and elevation. This is often called RICE therapy. An elastic wrap may be recommended to support your foot. Over-the-counter anti-inflammatory medicines may also be recommended for pain control. If your swelling or pain is severe, you may be given crutches. Follow these instructions at home: [place] the injured area. Try to avoid standing or walking while your foot is painful.  If directed, apply ice to  the injured area: ? Put ice in a plastic bag. ? Place a towel between your skin and the bag. ? Leave the ice on for 20 minutes, 2-3 times per day.  If directed, apply light compression to the injured area using an elastic wrap. Make sure the wrap is not too tight. Remove and reapply the wrap as told by your health care provider. If your toes become numb, cold, or blue, take the wrap off and reapply it more loosely.  Raise (elevate) the injured area above the level of your heart while you are sitting or lying down. General instructions  Take over-the-counter and prescription medicines only as told by your health care provider.  Use crutches as told by your health care provider, if this applies.  Keep all follow-up visits as told by your health care provider. This is important. Contact a health care provider if:  Your symptoms do not improve after several days of treatment.  You have more redness, swelling, or pain in your foot or toes.  You have difficulty moving the injured area.  Your swelling or pain is not relieved with medicines. Get help right away if:  You have severe pain.  Your foot or toes become numb.  Your foot or toes become pale or cold.  You cannot move your foot or ankle.  Your foot is warm to the touch. This information is not intended to replace advice given to you by your health care provider. Make sure you discuss  any questions you have with your health care provider. Document Released: 12/25/2005 Document Revised: 08/11/2015 Document Reviewed: 11/09/2014 Elsevier Interactive Patient Education  2018 [employer].    IF you received an x-ray today, you will receive an invoice from [facility]. Please contact [facility] Radiology at [phone] with questions or concerns regarding your invoice.   IF you received labwork today, you will receive an invoice from [place]. Please contact [facility] at [phone] with questions or concerns regarding  your invoice.   Our billing staff will not be able to assist you with questions regarding bills from these companies.  You will be contacted with the lab results as soon as they are available. The fastest way to get your results is to activate your My Chart account. Instructions are located on the last page of this paperwork. If you have not heard from [place] regarding the results in 2 weeks, please contact this office.

## 2017-09-03 ENCOUNTER — Encounter: Payer: Self-pay | Admitting: Allergy and Immunology

## 2017-09-03 ENCOUNTER — Ambulatory Visit (INDEPENDENT_AMBULATORY_CARE_PROVIDER_SITE_OTHER): Payer: Medicare Other | Admitting: Allergy and Immunology

## 2017-09-03 ENCOUNTER — Telehealth: Payer: Self-pay | Admitting: Allergy and Immunology

## 2017-09-03 VITALS — BP 130/60 | HR 88 | Temp 97.6°F | Resp 18 | Ht 61.2 in | Wt 227.2 lb

## 2017-09-03 DIAGNOSIS — J3089 Other allergic rhinitis: Secondary | ICD-10-CM | POA: Diagnosis not present

## 2017-09-03 DIAGNOSIS — J4530 Mild persistent asthma, uncomplicated: Secondary | ICD-10-CM | POA: Insufficient documentation

## 2017-09-03 DIAGNOSIS — J4520 Mild intermittent asthma, uncomplicated: Secondary | ICD-10-CM

## 2017-09-03 DIAGNOSIS — T7800XA Anaphylactic reaction due to unspecified food, initial encounter: Secondary | ICD-10-CM | POA: Insufficient documentation

## 2017-09-03 DIAGNOSIS — J45909 Unspecified asthma, uncomplicated: Secondary | ICD-10-CM

## 2017-09-03 DIAGNOSIS — T7840XD Allergy, unspecified, subsequent encounter: Secondary | ICD-10-CM

## 2017-09-03 HISTORY — DX: Unspecified asthma, uncomplicated: J45.909

## 2017-09-03 MED ORDER — FLUTICASONE PROPIONATE 50 MCG/ACT NA SUSP: 1.0000 | Freq: Every day | NASAL | 5 refills | Status: AC

## 2017-09-03 MED ORDER — ALBUTEROL SULFATE HFA 108 (90 BASE) MCG/ACT IN AERS: 2.0000 | INHALATION_SPRAY | Freq: Four times a day (QID) | RESPIRATORY_TRACT | 5 refills | Status: DC | PRN

## 2017-09-03 MED ORDER — EPINEPHRINE 0.3 MG/0.3ML IJ SOAJ: 0.3000 mg | Freq: Once | INTRAMUSCULAR | 1 refills | Status: AC

## 2017-09-03 MED ORDER — FEXOFENADINE HCL 180 MG PO TABS: 180.0000 mg | ORAL_TABLET | Freq: Every day | ORAL | 5 refills | Status: DC

## 2017-09-03 NOTE — Assessment & Plan Note (Signed)
Possible food allergy.  The patients history suggests peanut and tree nut allergy, though todays skin tests were negative despite a positive histamine control.  Food allergen skin testing has excellent negative predictive value however there is still a 5% chance that the allergy exists.  Therefore, we will investigate further with serum specific IgE levels and, if negative, open graded oral challenge.  A laboratory order form has been provided for baseline tryptase level and serum specific IgE against peanut, peanut component, tree nut panel, and alpha gal panel.  Until the food allergy has been definitively ruled out, the patient is to continue meticulous avoidance and have access to epinephrine autoinjector 2 pack.  A prescription has been provided for epinephrine 0.3 mg autoinjector 2 pack along with instructions for its proper administration.  Should symptoms recur, a journal is to be kept recording any foods eaten, beverages consumed, and medications taken within a 6 hour time period prior to the onset of symptoms, as well as record activities being performed, and environmental conditions. For any symptoms concerning for anaphylaxis, epinephrine is to be administered and 911 is to be called immediately.

## 2017-09-03 NOTE — Progress Notes (Signed)
  New Patient Note  RE: [name] MRN: [id number] DOB: 04/03/1945 Date of Office Visit: 09/03/2017  Referring provider: [name], [name] M, MD Primary care provider: [name], [name] M, MD  Chief Complaint: Allergic Reaction   History of present illness: [name] is a 72 y.o. female seen today in consultation requested by [name], MD.  She reports that approximately 6 months ago while eating trail mix with peanuts and tree nuts and "started coughing real bad", felt like her throat was closing, and "could barely breath." She drank water and the symptoms resolved within 30 minutes. She did not experience concomitant urticaria, cardiopulmonary symptoms, or other GI symptoms. Approximately 2 months later she once again consumed trail mix and experienced a similar reaction. The symptoms resolved within 30 minutes without medical intervention. On one other occasion, she consumed chocolate cake with pecans and experienced the sensation of swelling in her throat.  She has avoided peanuts and tree nuts since that time.  Currently does not have an epinephrine autoinjector. Lailee variances nasal congestion, rhinorrhea, postnasal drainage "all the time", nasal pruritus, and ocular pruritus.  These symptoms occur year around without specific environmental triggers.  She experiences coughing, dyspnea, chest tightness, and wheezing with respiratory tract infections.  She has been told that she has asthmatic bronchitis.  She also reports that she wheezes when she is around cats.  She has an albuterol rescue inhaler, however notes that it is expired.  Assessment and plan: Allergic reaction Possible food allergy.  The patients history suggests peanut and tree nut allergy, though todays skin tests were negative despite a positive histamine control.  Food allergen skin testing has excellent negative predictive value however there is still a 5% chance that the allergy exists.  Therefore, we will  investigate further with serum specific IgE levels and, if negative, open graded oral challenge.  A laboratory order form has been provided for baseline tryptase level and serum specific IgE against peanut, peanut component, tree nut panel, and alpha gal panel.  Until the food allergy has been definitively ruled out, the patient is to continue meticulous avoidance and have access to epinephrine autoinjector 2 pack.  A prescription has been provided for epinephrine 0.3 mg autoinjector 2 pack along with instructions for its proper administration.  Should symptoms recur, a journal is to be kept recording any foods eaten, beverages consumed, and medications taken within a 6 hour time period prior to the onset of symptoms, as well as record activities being performed, and environmental conditions. For any symptoms concerning for anaphylaxis, epinephrine is to be administered and 911 is to be called immediately.  Perennial allergic rhinitis  Aeroallergen avoidance measures have been discussed and provided in written form.  A prescription has been provided for fexofenadine (Allegra) 180 mg daily if needed.  A prescription has been provided for fluticasone nasal spray, one spray per nostril 1-2 times daily as needed. Proper nasal spray technique has been discussed and demonstrated.  Nasal saline spray (i.e., Simply Saline) or nasal saline lavage (i.e., NeilMed) is recommended as needed and prior to medicated nasal sprays.  Asthmatic bronchitis  A refill prescription has been provided for albuterol HFA, 1 to 2 inhalations every 6 hours if needed.  Subjective and objective measures of pulmonary function will be followed and the treatment plan will be adjusted accordingly.   Meds ordered this encounter  Medications  . EPINEPHrine 0.3 mg/0.3 mL IJ SOAJ injection    Sig: Inject 0.3 mLs (0.3 mg total) into   the muscle once for 1 dose. As needed for life-threatening allergic reactions    Dispense:  2  Device    Refill:  1  . fexofenadine (ALLEGRA) 180 MG tablet    Sig: Take 1 tablet (180 mg total) by mouth daily.    Dispense:  30 tablet    Refill:  5  . fluticasone (FLONASE) 50 MCG/ACT nasal spray    Sig: Place 1 spray into both nostrils daily.    Dispense:  16 g    Refill:  5  . albuterol (PROVENTIL HFA;VENTOLIN HFA) 108 (90 Base) MCG/ACT inhaler    Sig: Inhale 2 puffs into the lungs every 6 (six) hours as needed for wheezing or shortness of breath.    Dispense:  18 g    Refill:  5    Diagnostics: Spirometry: FVC was 1.79 L and FEV1 is 1.55 L (80% predicted) with 70 mL (5%) postbronchodilator improvement.  Mild restrictive pattern without significant reversibility.  This study was performed while the patient was asymptomatic.  Please see scanned spirometry results for details. Environmental skin testing: Positive to cat hair and dust mite antigen. Food allergen skin testing: Negative despite a positive histamine control.    Physical examination: Blood pressure 130/60, pulse 88, temperature 97.6 F (36.4 C), temperature source Oral, resp. rate 18, height 5' 1.2" (1.554 m), weight 227 lb 3.2 oz (103.1 kg), SpO2 94 %.  General: Alert, interactive, in no acute distress. HEENT: TMs pearly gray, turbinates moderately edematous with thick discharge, post-pharynx mildly erythematous. Neck: Supple without lymphadenopathy. Lungs: Clear to auscultation without wheezing, rhonchi or rales. CV: Normal S1, S2 without murmurs. Abdomen: Nondistended, nontender. Skin: Warm and dry, without lesions or rashes. Extremities:  No clubbing, cyanosis or edema. Neuro:   Grossly intact.  Review of systems:  Review of systems negative except as noted in HPI / PMHx or noted below: Review of Systems  Constitutional: Negative.   HENT: Negative.   Eyes: Negative.   Respiratory: Negative.   Cardiovascular: Negative.   Gastrointestinal: Negative.   Genitourinary: Negative.   Musculoskeletal:  Negative.   Skin: Negative.   Neurological: Negative.   Endo/Heme/Allergies: Negative.   Psychiatric/Behavioral: Negative.     Past medical history:  Past Medical History:  Diagnosis Date  . Allergic rhinitis 03/23/2013  . Allergy   . Anemia    due to vaginal bleeding  . Arthritis    hands  . Asthmatic bronchitis 09/03/2017  . Breast cancer ([place]) 08/2012   left  . Dental crowns present   . Eczema   . Fibroids   . Headache(784.0)    tension  . History of endometriosis   . Hyperlipidemia   . Hypertension    under control with med., has been on med. x 3 yr.  . OSA (obstructive sleep apnea)   . Postmenopausal bleeding   . Recurrent upper respiratory infection (URI)   . S/P radiation therapy 10/20/2012-12/05/2012   left breast 50.4 gray, lumpectomy cavity boosted to 62.4 gray  . Seasonal allergies   . Sleep apnea    uses CPAP nightly  . Urinary urgency   . White coat hypertension     Past surgical history:  Past Surgical History:  Procedure Laterality Date  . BACK SURGERY  1997   lumbar  . BREAST LUMPECTOMY WITH NEEDLE LOCALIZATION AND AXILLARY SENTINEL LYMPH NODE BX Left 09/03/2012   Procedure: BREAST LUMPECTOMY WITH NEEDLE LOCALIZATION AND AXILLARY SENTINEL LYMPH NODE BX;  Surgeon: Edward Jolly, MD;  Location:  [facility];  Service: General;  Laterality: Left;  . CHOLECYSTECTOMY  1995  . COLONOSCOPY W/ POLYPECTOMY  03/20/2007  . DILATION AND CURETTAGE OF UTERUS  2005  . DILATION AND CURETTAGE OF UTERUS  06/22/2011   Procedure: DILATATION AND CURETTAGE;  Surgeon: Daniel L Clarke-Pearson, MD;  Location:  SURGERY CENTER;  Service: Gynecology;  Laterality: N/A;  OK PER KEELA FOR 7:15 START  . DORSAL COMPARTMENT RELEASE Right 05/19/2013   Procedure: RELEASE 1ST  DORSAL COMPARTMENT RIGHT (DEQUERVAIN);  Surgeon: Robert V Sypher Jr., MD;  Location:  SURGERY CENTER;  Service: Orthopedics;  Laterality: Right;  . EXPLORATORY LAPAROTOMY  06-30-2007     ATTEMPTED HYSTERECTOMY ABORTED DUE TO EXTENSIVE ENDOMETRIOSIS W/ DENSE PELVIC ADHESIONS INVOLVING UTERUS AND LOWER RECTOSIGMOID  . HYSTEROSCOPY W/D&C  05/06/2009    Family history: Family History  Problem Relation Age of Onset  . Vision loss Mother   . Hypertension Mother   . Anesthesia problems Mother        post-op N/V  . Heart disease Mother        carotid stenosis s/p CAE  . Osteoporosis Mother   . Eczema Mother   . Depression Father   . Heart disease Sister 18       arrhythmia  . Hypertension Sister   . Hyperlipidemia Sister   . Diabetes Brother   . Hypertension Brother   . Cancer Brother        skin  . Hyperlipidemia Brother   . Stroke Brother   . COPD Daughter   . Atopy Daughter   . Cancer Cousin   . Kidney failure Maternal Grandmother   . Heart disease Maternal Grandfather   . Hypertension Other   . Colon cancer Neg Hx   . Allergic rhinitis Neg Hx   . Asthma Neg Hx   . Urticaria Neg Hx     Social history: Social History   Socioeconomic History  . Marital status: Married    Spouse name: Phillip  . Number of children: 4  . Years of education: College  . Highest education level: Not on file  Occupational History  . Occupation: retired  Social Needs  . Financial resource strain: Not on file  . Food insecurity:    Worry: Not on file    Inability: Not on file  . Transportation needs:    Medical: Not on file    Non-medical: Not on file  Tobacco Use  . Smoking status: Never Smoker  . Smokeless tobacco: Never Used  Substance and Sexual Activity  . Alcohol use: No    Alcohol/week: 0.0 oz  . Drug use: No  . Sexual activity: Yes    Comment: menarche 13, P4, Premarin for short while, Provera x 3 years  Lifestyle  . Physical activity:    Days per week: Not on file    Minutes per session: Not on file  . Stress: Not on file  Relationships  . Social connections:    Talks on phone: Not on file    Gets together: Not on file    Attends religious  service: Not on file    Active member of club or organization: Not on file    Attends meetings of clubs or organizations: Not on file    Relationship status: Not on file  . Intimate partner violence:    Fear of current or ex partner: Not on file    Emotionally abused: Not on file    Physically abused:   Not on file    Forced sexual activity: Not on file  Other Topics Concern  . Not on file  Social History Narrative   Marital status: married x 41  years; happily married; no abuse.      Children: 4 children (2 sons living; 1 son died in [address]69 at age 37years old, 1 daughter); six grandchildren; no gg.      Lives:  With husband.      Employed: retired in 2011; [employer].      Tobacco: none       Alcohol:  None      Drugs: none      Exercise:  Rowing 3 days per week.      Seatbelt:  100%      Sunscreen:  Face SPF 15.      Guns:  Loaded mostly secured.      ADLs:  Drives minimally; no assistant devices; independent with all ADLs.       Advanced Directives: YES; FULL CODE but no prolonged measures.      Her nocturia is improved under CPAP use at 10 cm water- AHI is now 0.8 and from 30 at baseline. CMS compliance  6 hours 40 minutes - sleeps on the side, 08-10-11 .FFM - likes it.    . Flonase used prn.     Caffeine Use: 1 cup daily   Environmental History: The patient lives in a 73 year old house with hardwood floors throughout, gas heat, and central air.  She is a non-smoker without pets.  There is mold/water damage in the home.  Allergies as of 09/03/2017      Reactions   Adhesive [tape] Rash   Latex Itching, Other (See Comments)   Causes blisters      Medication List        Accurate as of 09/03/17  3:29 PM. Always use your most recent med list.          albuterol 108 (90 Base) MCG/ACT inhaler Commonly known as:  PROVENTIL HFA;VENTOLIN HFA Inhale 2 puffs into the lungs every 6 (six) hours as needed for wheezing or shortness of breath.   aspirin 81 MG  tablet Take 81 mg by mouth daily.   blood glucose meter kit and supplies Kit Test blood sugar once daily. Dx code: E11.9   cholecalciferol 1000 units tablet Commonly known as:  VITAMIN D Take 1,000 Units by mouth daily.   EPINEPHrine 0.3 mg/0.3 mL Soaj injection Commonly known as:  EPI-PEN Inject 0.3 mLs (0.3 mg total) into the muscle once for 1 dose. As needed for life-threatening allergic reactions   fexofenadine 180 MG tablet Commonly known as:  ALLEGRA Take 1 tablet (180 mg total) by mouth daily.   fluticasone 50 MCG/ACT nasal spray Commonly known as:  FLONASE Place 1 spray into both nostrils daily.   glucose blood test strip Commonly known as:  ONE TOUCH ULTRA TEST USE ONE STRIP TO CHECK GLUCOSE ONCE DAILY   lisinopril-hydrochlorothiazide 10-12.5 MG tablet Commonly known as:  PRINZIDE,ZESTORETIC Take 1 tablet by mouth daily.   lovastatin 40 MG tablet Commonly known as:  MEVACOR Take 1 tablet (40 mg total) by mouth at bedtime.   metFORMIN 500 MG tablet Commonly known as:  GLUCOPHAGE Take 2 tablets (1,000 mg total) by mouth 2 (two) times daily with a meal.   metoprolol succinate 50 MG 24 hr tablet Commonly known as:  TOPROL-XL Take 50 mg by mouth daily.   ONETOUCH DELICA LANCETS 67E Misc  USE ONE  TO CHECK GLUCOSE ONCE DAILY   tamoxifen 20 MG tablet Commonly known as:  NOLVADEX TAKE 1 TABLET BY MOUTH ONCE DAILY   VITAMIN B-12 CR PO Take 500 mcg by mouth daily.   vitamin C 500 MG tablet Commonly known as:  ASCORBIC ACID Take 500 mg by mouth daily.       Known medication allergies: Allergies  Allergen Reactions  . Adhesive [Tape] Rash  . Latex Itching and Other (See Comments)    Causes blisters    I appreciate the opportunity to take part in Ledonna's care. Please do not hesitate to contact me with questions.  Sincerely,   R. Carter Bobbitt, MD 

## 2017-09-03 NOTE — Patient Instructions (Signed)
Allergic reaction Possible food allergy.  The patients history suggests peanut and tree nut allergy, though todays skin tests were negative despite a positive histamine control.  Food allergen skin testing has excellent negative predictive value however there is still a 5% chance that the allergy exists.  Therefore, we will investigate further with serum specific IgE levels and, if negative, open graded oral challenge.  A laboratory order form has been provided for baseline tryptase level and serum specific IgE against peanut, peanut component, tree nut panel, and alpha gal panel.  Until the food allergy has been definitively ruled out, the patient is to continue meticulous avoidance and have access to epinephrine autoinjector 2 pack.  A prescription has been provided for epinephrine 0.3 mg autoinjector 2 pack along with instructions for its proper administration.  Should symptoms recur, a journal is to be kept recording any foods eaten, beverages consumed, and medications taken within a 6 hour time period prior to the onset of symptoms, as well as record activities being performed, and environmental conditions. For any symptoms concerning for anaphylaxis, epinephrine is to be administered and 911 is to be called immediately.  Perennial allergic rhinitis  Aeroallergen avoidance measures have been discussed and provided in written form.  A prescription has been provided for fexofenadine (Allegra) 180 mg daily if needed.  A prescription has been provided for fluticasone nasal spray, one spray per nostril 1-2 times daily as needed. Proper nasal spray technique has been discussed and demonstrated.  Nasal saline spray (i.e., Simply Saline) or nasal saline lavage (i.e., NeilMed) is recommended as needed and prior to medicated nasal sprays.  Asthmatic bronchitis  A refill prescription has been provided for albuterol HFA, 1 to 2 inhalations every 6 hours if needed.  Subjective and objective  measures of pulmonary function will be followed and the treatment plan will be adjusted accordingly.   When lab results have returned the patient will be called with further recommendations and follow up instructions.  Control of House Dust Mite Allergen  House dust mites play a major role in allergic asthma and rhinitis.  They occur in environments with high humidity wherever human skin, the food for dust mites is found. High levels have been detected in dust obtained from mattresses, pillows, carpets, upholstered furniture, bed covers, clothes and soft toys.  The principal allergen of the house dust mite is found in its feces.  A gram of dust may contain 1,000 mites and 250,000 fecal particles.  Mite antigen is easily measured in the air during house cleaning activities.    1. Encase mattresses, including the box spring, and pillow, in an air tight cover.  Seal the zipper end of the encased mattresses with wide adhesive tape. 2. Wash the bedding in water of 130 degrees Farenheit weekly.  Avoid cotton comforters/quilts and flannel bedding: the most ideal bed covering is the dacron comforter. 3. Remove all upholstered furniture from the bedroom. 4. Remove carpets, carpet padding, rugs, and non-washable window drapes from the bedroom.  Wash drapes weekly or use plastic window coverings. 5. Remove all non-washable stuffed toys from the bedroom.  Wash stuffed toys weekly. 6. Have the room cleaned frequently with a vacuum cleaner and a damp dust-mop.  The patient should not be in a room which is being cleaned and should wait 1 hour after cleaning before going into the room. 7. Close and seal all heating outlets in the bedroom.  Otherwise, the room will become filled with dust-laden air.  An Web designer  can be used to heat the room. 8. Reduce indoor humidity to less than 50%.  Do not use a humidifier.  Control of Dog or Cat Allergen  Avoidance is the best way to manage a dog or cat allergy. If  you have a dog or cat and are allergic to dog or cats, consider removing the dog or cat from the home. If you have a dog or cat but don't want to find it a new home, or if your family wants a pet even though someone in the household is allergic, here are some strategies that may help keep symptoms at bay:  1. Keep the pet out of your bedroom and restrict it to only a few rooms. Be advised that keeping the dog or cat in only one room will not limit the allergens to that room. 2. Don't pet, hug or kiss the dog or cat; if you do, wash your hands with soap and water. 3. High-efficiency particulate air (HEPA) cleaners run continuously in a bedroom or living room can reduce allergen levels over time. 4. Place electrostatic material sheet in the air inlet vent in the bedroom. 5. Regular use of a high-efficiency vacuum cleaner or a central vacuum can reduce allergen levels. 6. Giving your dog or cat a bath at least once a week can reduce airborne allergen.

## 2017-09-03 NOTE — Assessment & Plan Note (Signed)
   Aeroallergen avoidance measures have been discussed and provided in written form.  A prescription has been provided for fexofenadine (Allegra) 180 mg daily if needed.  A prescription has been provided for fluticasone nasal spray, one spray per nostril 1-2 times daily as needed. Proper nasal spray technique has been discussed and demonstrated.  Nasal saline spray (i.e., Simply Saline) or nasal saline lavage (i.e., NeilMed) is recommended as needed and prior to medicated nasal sprays.

## 2017-09-03 NOTE — Assessment & Plan Note (Signed)
   A refill prescription has been provided for albuterol HFA, 1 to 2 inhalations every 6 hours if needed.  Subjective and objective measures of pulmonary function will be followed and the treatment plan will be adjusted accordingly.

## 2017-09-03 NOTE — Telephone Encounter (Signed)
[facility] but to high. The epi-pen and the Albuterol inhaler.[place].

## 2017-09-04 NOTE — Telephone Encounter (Signed)
Spoke to pharmacy ventolin hfa is $56.96 and epipen is $270.50 generic mylan. Dr Verlin Fester please advise

## 2017-09-04 NOTE — Telephone Encounter (Signed)
Are there any other albuterol or epinephrine options with better coverage?

## 2017-09-05 ENCOUNTER — Other Ambulatory Visit: Payer: Self-pay | Admitting: *Deleted

## 2017-09-08 LAB — ALLERGEN COCONUT IGE: Allergen Coconut IgE: <0.1 kU/L

## 2017-09-08 LAB — ALPHA-GAL PANEL
Alpha Gal IgE*: <0.1 kU/L (ref <0.10)
Beef (Bos spp) IgE: <0.1 kU/L (ref <0.35)
Class Interpretation: 0
Class Interpretation: 0
Class Interpretation: 0
Lamb/Mutton (Ovis spp) IgE: <0.1 kU/L (ref <0.35)
Pork (Sus spp) IgE: <0.1 kU/L (ref <0.35)

## 2017-09-08 LAB — ALLERGEN PISTACHIO F203: F203-IgE Pistachio Nut: <0.1 kU/L

## 2017-09-08 LAB — ALLERGENS(7)
Brazil Nut IgE: <0.1 kU/L
F020-IgE Almond: <0.1 kU/L
F202-IgE Cashew Nut: <0.1 kU/L
Hazelnut (Filbert) IgE: <0.1 kU/L
Peanut IgE: <0.1 kU/L
Pecan Nut IgE: <0.1 kU/L
Walnut IgE: <0.1 kU/L

## 2017-09-08 LAB — IGE PEANUT COMPONENT PROFILE
F352-IgE Ara h 8: <0.1 kU/L
F422-IgE Ara h 1: <0.1 kU/L
F423-IgE Ara h 2: <0.1 kU/L
F424-IgE Ara h 3: <0.1 kU/L
F427-IgE Ara h 9: <0.1 kU/L
F447-IgE Ara h 6: <0.1 kU/L

## 2017-09-08 LAB — TRYPTASE: Tryptase: 6.9 ug/L (ref 2.2–13.2)

## 2017-09-09 DIAGNOSIS — I445 Left posterior fascicular block: Secondary | ICD-10-CM | POA: Diagnosis not present

## 2017-09-09 DIAGNOSIS — R0602 Shortness of breath: Secondary | ICD-10-CM | POA: Diagnosis not present

## 2017-09-09 DIAGNOSIS — I442 Atrioventricular block, complete: Secondary | ICD-10-CM | POA: Diagnosis not present

## 2017-09-09 DIAGNOSIS — R062 Wheezing: Secondary | ICD-10-CM | POA: Diagnosis not present

## 2017-09-09 DIAGNOSIS — R002 Palpitations: Secondary | ICD-10-CM | POA: Diagnosis not present

## 2017-09-09 DIAGNOSIS — E119 Type 2 diabetes mellitus without complications: Secondary | ICD-10-CM | POA: Diagnosis not present

## 2017-09-09 NOTE — Telephone Encounter (Signed)
We had it sent in for Proventil or Ventolin, so we could send in as ProAir HFA. As for the EpiPen I can give the patient the "manned by mylan" contact info to see if that would help out with cost.

## 2017-09-09 NOTE — Telephone Encounter (Signed)
Sounds good. Thanks 

## 2017-09-11 NOTE — Telephone Encounter (Signed)
Left message for patient to call back  

## 2017-09-12 ENCOUNTER — Telehealth: Payer: Self-pay

## 2017-09-12 MED ORDER — ALBUTEROL SULFATE HFA 108 (90 BASE) MCG/ACT IN AERS: 2.0000 | INHALATION_SPRAY | RESPIRATORY_TRACT | 1 refills | Status: DC | PRN

## 2017-09-12 NOTE — Telephone Encounter (Signed)
Patient called back.  Told patient to bring in a jar of plain smooth peanut butter, not peanuts, per Dr. Verlin Fester.  No antihistamines for 72 hours prior to food challenge visit.  Patient verbalized understanding.

## 2017-09-12 NOTE — Telephone Encounter (Signed)
She should bring in peanut butter - we will be gradually increasing the amount to equal a full serving. Thanks.

## 2017-09-12 NOTE — Telephone Encounter (Signed)
I advised patient of change of inhaler to Proair and rx sent.  I advised her that at this time we do not have any samples of epipen but can try back to see if we get in any in the next couple weeks

## 2017-09-12 NOTE — Telephone Encounter (Signed)
Lm for pt to call us back  

## 2017-09-12 NOTE — Telephone Encounter (Signed)
I scheduled pt to a food challenge for peanut per your request. Pt was wondering since she can have a small amount of peanut butter and be fine can she bring in just peanuts for the challenge? Please advise

## 2017-09-17 DIAGNOSIS — R002 Palpitations: Secondary | ICD-10-CM | POA: Diagnosis not present

## 2017-09-17 DIAGNOSIS — R0602 Shortness of breath: Secondary | ICD-10-CM | POA: Diagnosis not present

## 2017-10-06 MED ORDER — LISINOPRIL-HYDROCHLOROTHIAZIDE 10-12.5 MG PO TABS: 1.0000 | ORAL_TABLET | Freq: Every day | ORAL | 1 refills | Status: DC

## 2017-10-06 MED ORDER — ONETOUCH DELICA LANCETS 33G MISC: 2 refills | Status: DC

## 2017-10-06 MED ORDER — GLUCOSE BLOOD VI STRP: ORAL_STRIP | 3 refills | Status: DC

## 2017-10-22 ENCOUNTER — Ambulatory Visit (INDEPENDENT_AMBULATORY_CARE_PROVIDER_SITE_OTHER): Payer: Medicare Other | Admitting: Allergy and Immunology

## 2017-10-22 ENCOUNTER — Encounter: Payer: Self-pay | Admitting: Allergy and Immunology

## 2017-10-22 VITALS — BP 152/80 | HR 80 | Resp 20

## 2017-10-22 DIAGNOSIS — J3089 Other allergic rhinitis: Secondary | ICD-10-CM

## 2017-10-22 DIAGNOSIS — Z91018 Allergy to other foods: Secondary | ICD-10-CM | POA: Diagnosis not present

## 2017-10-22 DIAGNOSIS — T7800XD Anaphylactic reaction due to unspecified food, subsequent encounter: Secondary | ICD-10-CM

## 2017-10-22 DIAGNOSIS — J4530 Mild persistent asthma, uncomplicated: Secondary | ICD-10-CM

## 2017-10-22 DIAGNOSIS — T782XXA Anaphylactic shock, unspecified, initial encounter: Secondary | ICD-10-CM | POA: Diagnosis not present

## 2017-10-22 MED ORDER — EPINEPHRINE (ANAPHYLAXIS) 1 MG/ML IJ SOLN: 0.3000 mg | Freq: Once | INTRAMUSCULAR | Status: AC

## 2017-10-22 MED ORDER — EPINEPHRINE 0.3 MG/0.3ML IJ SOAJ: 0.3000 mg | Freq: Once | INTRAMUSCULAR | 2 refills | Status: AC

## 2017-10-22 MED ORDER — FLUTICASONE PROPIONATE HFA 44 MCG/ACT IN AERO: 2.0000 | INHALATION_SPRAY | Freq: Two times a day (BID) | RESPIRATORY_TRACT | 5 refills | Status: DC

## 2017-10-22 MED ORDER — FEXOFENADINE HCL 180 MG PO TABS: 180.0000 mg | ORAL_TABLET | Freq: Every day | ORAL | 2 refills | Status: DC

## 2017-10-22 NOTE — Assessment & Plan Note (Addendum)
The patient had an anaphylactic reaction within minutes of ingesting the very first dose (0.5 g) of peanut butter during a peanut butter challenge today.  She was treated with diphenhydramine 50 mg, epinephrine 0.3 mg, and Xopenex/Atrovent via the nebulizer.  She was treated and observed over the course of 2-1/2 hours.  She was discharged without symptoms and with stable vital signs.  Should patient experience recurrence of symptoms concerning for anaphylaxis, epinephrine is to be administered and 911 is to be called immediately.

## 2017-10-22 NOTE — Assessment & Plan Note (Signed)
The patient had negative in vivo and in vitro tests to peanut and was able to tolerate the open graded oral challenge today without adverse signs or symptoms. Therefore, she has the same risk of systemic reaction associated with the consumption of peanut as the general population.  Foods containing peanut may be re-introduced into the diet, gradually at first with access to diphenhydramine and epinephrine auto-injectors.

## 2017-10-22 NOTE — Assessment & Plan Note (Signed)
The patient's history suggests peanut/tree nut allergy and failed open graded oral challenge today confirm this diagnosis.  Meticulous avoidance of peanuts and tree nuts as discussed.  A prescription has been provided for epinephrine auto-injector 2 pack along with instructions for proper administration.  A food allergy action plan has been provided and discussed.  Medic Alert identification is recommended.

## 2017-10-22 NOTE — Assessment & Plan Note (Signed)
Stable.  Continue appropriate allergen avoidance measures, fexofenadine 180 mg daily if needed, and fluticasone nasal spray if needed.  Nasal saline spray (i.e. Simply Saline) is recommended prior to medicated nasal sprays and as needed.

## 2017-10-22 NOTE — Progress Notes (Addendum)
FOLLOW UP NOTE  RE: [name] MRN: [id number] Date of Birth: May 29, 1945   Xopenex/Atrovent given after pre-spiro reviewed by MD. Patient began complaining of feeling lightheaded and dizzy. I had patient lay on bed and notified MD. Patient was stable and we proceeded with peanut challenge. I gave patient 0.5 g peanut butter at 942 am. 950 am patient complained of throat feeling scratchy/not right. Per Dr. Verlin Fester gave 50 mg/20 mL diphenhydramine suspension. Vitals 120/82, 80 bpm, 20 RR.Marland Kitchen Vitals rechecked at 1005 126/72, 78 bpm, 18 RR. Throat same. Some coughing, non repetitive. 1026 am coughing becoming more persistent and patient developed shakiness. Dr. Verlin Fester ordered epinephrine 0.3 mg (administered by Florestine Avers, CMA) in right arm 1035am. Patient  throat swelling was gone. Vitals checked at 1135 with bp 118/74, 90 bpm, and 20 rr. Patient stable with no systemic symptoms. Vitals checked final time at discharge. 120/85, 88 bpm, 18 RR. Patient still stable with no systemic symptoms. Blood sugar checked 175 reading.

## 2017-10-22 NOTE — Progress Notes (Signed)
Follow-up Note  RE: [name] MRN: [id number] DOB: 1946-01-09 Date of Office Visit: 10/22/2017  Primary care provider: [name], MD Referring provider: [name], MD  History of present illness: [name] is a 72 y.o. female with asthma, allergic rhinitis, and history of food allergy presenting today for open graded oral challenge to peanut.  She has had negative skin test to peanut as well as negative blood work to peanut and peanut components.  In the past, she has experienced throat tightness/discomfort with the consumption of peanuts and/or tree nuts.  She reports that over the past week she has been requiring albuterol rescue more frequently.  She has no nasal allergy symptom complaints today.  Assessment and plan: History of food allergy The patient had negative in vivo and in vitro tests to peanut and was able to tolerate the open graded oral challenge today without adverse signs or symptoms. Therefore, she has the same risk of systemic reaction associated with the consumption of peanut as the general population.  Foods containing peanut may be re-introduced into the diet, gradually at first with access to diphenhydramine and epinephrine auto-injectors.  Anaphylaxis The patient had an anaphylactic reaction within minutes of ingesting the very first dose (0.5 g) of peanut butter during a peanut butter challenge today.  She was treated with diphenhydramine 50 mg, epinephrine 0.3 mg, and Xopenex/Atrovent via the nebulizer.  She was treated and observed over the course of 2-1/2 hours.  She was discharged without symptoms and with stable vital signs.  Should patient experience recurrence of symptoms concerning for anaphylaxis, epinephrine is to be administered and 911 is to be called immediately.  Mild persistent asthma Currently with suboptimal control, we will step up therapy at this time.  A prescription has been provided for Flovent (fluticasone) 44 g,  2  inhalations twice a day. To maximize pulmonary deposition, a spacer has been provided along with instructions for its proper administration with an HFA inhaler.  For now, continue albuterol HFA, 1 to 2 inhalations every 6 hours if needed.  Subjective and objective measures of pulmonary function will be followed and the treatment plan will be adjusted accordingly.  Perennial allergic rhinitis Stable.  Continue appropriate allergen avoidance measures, fexofenadine 180 mg daily if needed, and fluticasone nasal spray if needed.  Nasal saline spray (i.e. Simply Saline) is recommended prior to medicated nasal sprays and as needed.  Food allergy The patient's history suggests peanut/tree nut allergy and failed open graded oral challenge today confirm this diagnosis.  Meticulous avoidance of peanuts and tree nuts as discussed.  A prescription has been provided for epinephrine auto-injector 2 pack along with instructions for proper administration.  A food allergy action plan has been provided and discussed.  Medic Alert identification is recommended.   Meds ordered this encounter  Medications  . EPINEPHrine (EPIPEN 2-PAK) 0.3 mg/0.3 mL IJ SOAJ injection    Sig: Inject 0.3 mLs (0.3 mg total) into the muscle once for 1 dose.    Dispense:  4 Device    Refill:  2    Diagnostics: Spirometry reveals an FVC of 1.86 L and an FEV1 of 1.51 L (79% predicted) with significant (220 mL, 15%) postbronchodilator improvement.  Please see scanned spirometry results for details.  Open graded peanut oral challenge: Failed.  Patient experienced systemic symptoms after the first dose (0.5 g) of peanut butter, prompting treatment for anaphylaxis and discontinuation of the challenge.     Physical examination: Blood pressure Marland Kitchen)  152/80, pulse 80, resp. rate 20, SpO2 98 %.  General: Alert, interactive, in no acute distress. HEENT: Oral pharyngeal exam normal with the exception of moderate  erythema. Neck: Supple without lymphadenopathy. Lungs: Clear to auscultation without wheezing, rhonchi or rales. CV: Normal S1, S2 without murmurs. Skin: Warm and dry, without lesions or rashes.  The following portions of the patient's history were reviewed and updated as appropriate: allergies, current medications, past family history, past medical history, past social history, past surgical history and problem list.  Allergies as of 10/22/2017      Reactions   Adhesive [tape] Rash   Latex Itching, Other (See Comments)   Causes blisters      Medication List        Accurate as of 10/22/17  1:53 PM. Always use your most recent med list.          albuterol 108 (90 Base) MCG/ACT inhaler Commonly known as:  PROAIR HFA Inhale 2 puffs into the lungs every 4 (four) hours as needed for wheezing or shortness of breath.   aspirin 81 MG tablet Take 81 mg by mouth daily.   blood glucose meter kit and supplies Kit Test blood sugar once daily. Dx code: E11.9   cholecalciferol 1000 units tablet Commonly known as:  VITAMIN D Take 1,000 Units by mouth daily.   EPINEPHrine 0.3 mg/0.3 mL Soaj injection Commonly known as:  EPIPEN 2-PAK Inject 0.3 mLs (0.3 mg total) into the muscle once for 1 dose.   fexofenadine 180 MG tablet Commonly known as:  ALLEGRA Take 1 tablet (180 mg total) by mouth daily.   fluticasone 50 MCG/ACT nasal spray Commonly known as:  FLONASE Place 1 spray into both nostrils daily.   glucose blood test strip Commonly known as:  ONE TOUCH ULTRA TEST USE ONE STRIP TO CHECK GLUCOSE ONCE DAILY   lisinopril-hydrochlorothiazide 10-12.5 MG tablet Commonly known as:  PRINZIDE,ZESTORETIC Take 1 tablet by mouth daily.   lovastatin 40 MG tablet Commonly known as:  MEVACOR Take 1 tablet (40 mg total) by mouth at bedtime.   metFORMIN 500 MG tablet Commonly known as:  GLUCOPHAGE Take 2 tablets (1,000 mg total) by mouth 2 (two) times daily with a meal.   metoprolol  succinate 50 MG 24 hr tablet Commonly known as:  TOPROL-XL Take 50 mg by mouth daily.   ONETOUCH DELICA LANCETS 92E Misc USE ONE  TO CHECK GLUCOSE ONCE DAILY   tamoxifen 20 MG tablet Commonly known as:  NOLVADEX TAKE 1 TABLET BY MOUTH ONCE DAILY   VITAMIN B-12 CR PO Take 500 mcg by mouth daily.   vitamin C 500 MG tablet Commonly known as:  ASCORBIC ACID Take 500 mg by mouth daily.       Allergies  Allergen Reactions  . Adhesive [Tape] Rash  . Latex Itching and Other (See Comments)    Causes blisters   Review of systems: Review of systems negative except as noted in HPI / PMHx or noted below: Constitutional: Negative.  HENT: Negative.   Eyes: Negative.  Respiratory: Negative.   Cardiovascular: Negative.  Gastrointestinal: Negative.  Genitourinary: Negative.  Musculoskeletal: Negative.  Neurological: Negative.  Endo/Heme/Allergies: Negative.  Cutaneous: Negative.  Past Medical History:  Diagnosis Date  . Allergic rhinitis 03/23/2013  . Allergy   . Anemia    due to vaginal bleeding  . Arthritis    hands  . Asthmatic bronchitis 09/03/2017  . Breast cancer ([place]) 08/2012   left  . Dental crowns present   .  Eczema   . Fibroids   . Headache(784.0)    tension  . History of endometriosis   . Hyperlipidemia   . Hypertension    under control with med., has been on med. x 3 yr.  . OSA (obstructive sleep apnea)   . Postmenopausal bleeding   . Recurrent upper respiratory infection (URI)   . S/P radiation therapy 10/20/2012-12/05/2012   left breast 50.4 gray, lumpectomy cavity boosted to 62.4 gray  . Seasonal allergies   . Sleep apnea    uses CPAP nightly  . Urinary urgency   . White coat hypertension     Family History  Problem Relation Age of Onset  . Vision loss Mother   . Hypertension Mother   . Anesthesia problems Mother        post-op N/V  . Heart disease Mother        carotid stenosis s/p CAE  . Osteoporosis Mother   . Eczema Mother   . Depression  Father   . Heart disease Sister 18       arrhythmia  . Hypertension Sister   . Hyperlipidemia Sister   . Diabetes Brother   . Hypertension Brother   . Cancer Brother        skin  . Hyperlipidemia Brother   . Stroke Brother   . COPD Daughter   . Atopy Daughter   . Cancer Cousin   . Kidney failure Maternal Grandmother   . Heart disease Maternal Grandfather   . Hypertension Other   . Colon cancer Neg Hx   . Allergic rhinitis Neg Hx   . Asthma Neg Hx   . Urticaria Neg Hx     Social History   Socioeconomic History  . Marital status: Married    Spouse name: [name]  . Number of children: 4  . Years of education: College  . Highest education level: Not on file  Occupational History  . Occupation: retired  Scientific laboratory technician  . Financial resource strain: Not on file  . Food insecurity:    Worry: Not on file    Inability: Not on file  . Transportation needs:    Medical: Not on file    Non-medical: Not on file  Tobacco Use  . Smoking status: Never Smoker  . Smokeless tobacco: Never Used  Substance and Sexual Activity  . Alcohol use: No    Alcohol/week: 0.0 oz  . Drug use: No  . Sexual activity: Yes    Comment: menarche 8, P4, Premarin for short while, Provera x 3 years  Lifestyle  . Physical activity:    Days per week: Not on file    Minutes per session: Not on file  . Stress: Not on file  Relationships  . Social connections:    Talks on phone: Not on file    Gets together: Not on file    Attends religious service: Not on file    Active member of club or organization: Not on file    Attends meetings of clubs or organizations: Not on file    Relationship status: Not on file  . Intimate partner violence:    Fear of current or ex partner: Not on file    Emotionally abused: Not on file    Physically abused: Not on file    Forced sexual activity: Not on file  Other Topics Concern  . Not on file  Social History Narrative   Marital status: married x 98  years; happily  married; no abuse.  Children: 4 children (2 sons living; 1 son died in [id number] at age 62years old, 1 daughter); six grandchildren; no gg.      Lives:  With husband.      Employed: retired in 2011; [employer].      Tobacco: none       Alcohol:  None      Drugs: none      Exercise:  Rowing 3 days per week.      Seatbelt:  100%      Sunscreen:  Face SPF 15.      Guns:  Loaded mostly secured.      ADLs:  Drives minimally; no assistant devices; independent with all ADLs.       Advanced Directives: YES; FULL CODE but no prolonged measures.      Her nocturia is improved under CPAP use at 10 cm water- AHI is now 0.8 and from 30 at baseline. CMS compliance  6 hours 40 minutes - sleeps on the side, 08-10-11 .FFM - likes it.    . Flonase used prn.     Caffeine Use: 1 cup daily     I appreciate the opportunity to take part in [place] care. Please do not hesitate to contact me with questions.  Sincerely,   R. Edgar Frisk, MD

## 2017-10-22 NOTE — Assessment & Plan Note (Signed)
Currently with suboptimal control, we will step up therapy at this time.  A prescription has been provided for Flovent (fluticasone) 44 g, 2 inhalations twice a day. To maximize pulmonary deposition, a spacer has been provided along with instructions for its proper administration with an HFA inhaler.  For now, continue albuterol HFA, 1 to 2 inhalations every 6 hours if needed.  Subjective and objective measures of pulmonary function will be followed and the treatment plan will be adjusted accordingly.

## 2017-10-22 NOTE — Patient Instructions (Addendum)
History of food allergy The patient had negative in vivo and in vitro tests to peanut and was able to tolerate the open graded oral challenge today without adverse signs or symptoms. Therefore, she has the same risk of systemic reaction associated with the consumption of peanut as the general population.  Foods containing peanut may be re-introduced into the diet, gradually at first with access to diphenhydramine and epinephrine auto-injectors.  Anaphylaxis The patient had an anaphylactic reaction within minutes of ingesting the very first dose (0.5 g) of peanut butter during a peanut butter challenge today.  She was treated with diphenhydramine 50 mg, epinephrine 0.3 mg, and Xopenex/Atrovent via the nebulizer.  She was treated and observed over the course of 2-1/2 hours.  She was discharged without symptoms and with stable vital signs.  Should patient experience recurrence of symptoms concerning for anaphylaxis, epinephrine is to be administered and 911 is to be called immediately.  Mild persistent asthma Currently with suboptimal control, we will step up therapy at this time.  A prescription has been provided for Flovent (fluticasone) 44 g,  2 inhalations twice a day. To maximize pulmonary deposition, a spacer has been provided along with instructions for its proper administration with an HFA inhaler.  For now, continue albuterol HFA, 1 to 2 inhalations every 6 hours if needed.  Subjective and objective measures of pulmonary function will be followed and the treatment plan will be adjusted accordingly.  Perennial allergic rhinitis Stable.  Continue appropriate allergen avoidance measures, fexofenadine 180 mg daily if needed, and fluticasone nasal spray if needed.  Nasal saline spray (i.e. Simply Saline) is recommended prior to medicated nasal sprays and as needed.  Food allergy The patient's history suggests peanut/tree nut allergy and failed open graded oral challenge today confirm  this diagnosis.  Meticulous avoidance of peanuts and tree nuts as discussed.  A prescription has been provided for epinephrine auto-injector 2 pack along with instructions for proper administration.  A food allergy action plan has been provided and discussed.  Medic Alert identification is recommended.   Return in about 4 months (around 02/21/2018), or if symptoms worsen or fail to improve.

## 2017-10-23 MED ORDER — EPINEPHRINE PF 1 MG/ML IJ SOLN
0.3000 mg | Freq: Once | INTRAMUSCULAR | Status: AC
  Administered 2017-10-23: 0.3 mg via INTRAMUSCULAR

## 2017-10-23 NOTE — Addendum Note (Signed)
Addended by: Lucrezia Starch I on: 10/23/2017 09:11 AM   Modules accepted: Orders

## 2017-10-29 ENCOUNTER — Other Ambulatory Visit: Payer: Self-pay

## 2017-10-29 ENCOUNTER — Encounter: Payer: Self-pay | Admitting: Family Medicine

## 2017-10-29 ENCOUNTER — Ambulatory Visit (INDEPENDENT_AMBULATORY_CARE_PROVIDER_SITE_OTHER): Payer: Medicare Other | Admitting: Family Medicine

## 2017-10-29 VITALS — BP 176/85 | HR 77 | Temp 97.6°F | Ht 61.0 in | Wt 226.6 lb

## 2017-10-29 DIAGNOSIS — I10 Essential (primary) hypertension: Secondary | ICD-10-CM | POA: Diagnosis not present

## 2017-10-29 DIAGNOSIS — E119 Type 2 diabetes mellitus without complications: Secondary | ICD-10-CM | POA: Diagnosis not present

## 2017-10-29 DIAGNOSIS — E785 Hyperlipidemia, unspecified: Secondary | ICD-10-CM

## 2017-10-29 DIAGNOSIS — Z6841 Body Mass Index (BMI) 40.0 and over, adult: Secondary | ICD-10-CM

## 2017-10-29 LAB — POCT GLYCOSYLATED HEMOGLOBIN (HGB A1C): HEMOGLOBIN A1C: 7 % — AB (ref 4.0–5.6)

## 2017-10-29 MED ORDER — METFORMIN HCL 500 MG PO TABS: 500.0000 mg | ORAL_TABLET | Freq: Two times a day (BID) | ORAL | 1 refills | Status: DC

## 2017-10-29 NOTE — Patient Instructions (Addendum)
Try to place meds next to plate/silverware at dinner to help remember to take those meds. We can recheck cholesterol in 3 months.  Based on today's reading, 500mg  twice per day is fine and likley can decrease dose with exercise.   Low intensity exercise such as walking as tolerated most days per week.  Ultimate goal would be 150 minutes of exercise per week.   Thank you for coming in today.    IF you received an x-ray today, you will receive an invoice from [facility]. Please contact [facility] at [phone] with questions or concerns regarding your invoice.   IF you received labwork today, you will receive an invoice from [facility]. Please contact [facility] at [phone] with questions or concerns regarding your invoice.   Our billing staff will not be able to assist you with questions regarding bills from these companies.  You will be contacted with the lab results as soon as they are available. The fastest way to get your results is to activate your My Chart account. Instructions are located on the last page of this paperwork. If you have not heard from [place] regarding the results in 2 weeks, please contact this office.

## 2017-10-29 NOTE — Progress Notes (Signed)
Subjective:  By signing my name below, I, Essence Howell, attest that this documentation has been prepared under the direction and in the presence of Wendie Agreste, MD Electronically Signed: [name], ED Scribe 10/29/2017 at 8:19 AM.   Patient ID: [name], female    DOB: 05-06-1945, 72 y.o.   MRN: [id number]  Chief Complaint  Patient presents with  . Maintance    3 months f/u    HPI [name] is a 72 y.o. female who presents to Primary Care at [facility] for f/u.  DM Last seen 5/13. H/o missed doses of Metformin, specifically at night. Some missed doses of statin also. Continued metformin 500 mg 2 bid, lovastatin 40 mg for cholesterol, on ACE. Urine micro in 2017 was normal. Due for foot exam today. Optho 12/27/16. UTD on pneumovax 04/15/17. - Pt reports side-effects of diarrhea with 4 tabs of metformin. She is typically getting about 1000 mg/day as she is often still missing her evening doses. Optho exam scheduled for Oct. No regular exercise but states she is active doing work around her home. Lab Results  Component Value Date   HGBA1C 7.0 (A) 10/29/2017   Diabetic Foot Exam - Simple   Simple Foot Form Diabetic Foot exam was performed with the following findings:  Yes 10/29/2017  8:37 AM  Visual Inspection See comments:  Yes Sensation Testing Intact to touch and monofilament testing bilaterally:  Yes Pulse Check Posterior Tibialis and Dorsalis pulse intact bilaterally:  Yes Comments Both ankles are swollen. #9 on both feet the patient feels the prick but she says she barely feels it.     Hyperlipidemia Lab Results  Component Value Date   CHOL 226 (H) 07/29/2017   HDL 51 07/29/2017   LDLCALC 148 (H) 07/29/2017   TRIG 135 07/29/2017   CHOLHDL 4.4 07/29/2017   Lab Results  Component Value Date   ALT 22 07/29/2017   AST 38 07/29/2017   ALKPHOS 74 07/29/2017   BILITOT 0.6 07/29/2017  Lovastatin 40 mg qd with some missed doses at bedtime. Continued on  same with reminders of compliance. - She reports that she is still missing doses and only taking the meds about 2 out of 7 days/wk.  HTN Lab Results  Component Value Date   CREATININE 0.79 07/29/2017   BP Readings from Last 3 Encounters:  10/29/17 (!) 160/82  10/22/17 (!) 152/80  09/03/17 130/60  Normal BP at OV in June. - Pt states that her BP at her allergist was 118/70 last wk. Also states that her BP is low at home. She suspects that she has white coat HTN. Denies new side-effects.  Patient Active Problem List   Diagnosis Date Noted  . History of food allergy 10/22/2017  . Anaphylaxis 10/22/2017  . Food allergy 09/03/2017  . Mild persistent asthma 09/03/2017  . Spinal stenosis of lumbar region with neurogenic claudication 08/01/2016  . Pure hypercholesterolemia 08/01/2016  . Osteopenia 10/18/2014  . Obesity (BMI 30-39.9) 06/22/2014  . Perennial allergic rhinitis 03/23/2013  . Cancer of central portion of left female breast ([place]) 08/25/2012  . Obstructive sleep apnea on CPAP 04/21/2012  . Diabetes ([place]) 12/28/2011  . HTN (hypertension) 12/28/2011  . Uterine bleeding 04/30/2011   Past Medical History:  Diagnosis Date  . Allergic rhinitis 03/23/2013  . Allergy   . Anemia    due to vaginal bleeding  . Arthritis    hands  . Asthmatic bronchitis 09/03/2017  . Breast cancer ([place]) 08/2012  left  . Dental crowns present   . Eczema   . Fibroids   . Headache(784.0)    tension  . History of endometriosis   . Hyperlipidemia   . Hypertension    under control with med., has been on med. x 3 yr.  . OSA (obstructive sleep apnea)   . Postmenopausal bleeding   . Recurrent upper respiratory infection (URI)   . S/P radiation therapy 10/20/2012-12/05/2012   left breast 50.4 gray, lumpectomy cavity boosted to 62.4 gray  . Seasonal allergies   . Sleep apnea    uses CPAP nightly  . Urinary urgency   . White coat hypertension    Past Surgical History:  Procedure Laterality Date  .  BACK SURGERY  1997   lumbar  . BREAST LUMPECTOMY WITH NEEDLE LOCALIZATION AND AXILLARY SENTINEL LYMPH NODE BX Left 09/03/2012   Procedure: BREAST LUMPECTOMY WITH NEEDLE LOCALIZATION AND AXILLARY SENTINEL LYMPH NODE BX;  Surgeon: Benjamin T Hoxworth, MD;  Location: [facility];  Service: General;  Laterality: Left;  . CHOLECYSTECTOMY  1995  . COLONOSCOPY W/ POLYPECTOMY  03/20/2007  . DILATION AND CURETTAGE OF UTERUS  2005  . DILATION AND CURETTAGE OF UTERUS  06/22/2011   Procedure: DILATATION AND CURETTAGE;  Surgeon: Daniel L Clarke-Pearson, MD;  Location: [facility];  Service: Gynecology;  Laterality: N/A;  OK PER KEELA FOR 7:15 START  . DORSAL COMPARTMENT RELEASE Right 05/19/2013   Procedure: RELEASE 1ST  DORSAL COMPARTMENT RIGHT (DEQUERVAIN);  Surgeon: Robert V Sypher Jr., MD;  Location: [facility];  Service: Orthopedics;  Laterality: Right;  . EXPLORATORY LAPAROTOMY  06-30-2007   ATTEMPTED HYSTERECTOMY ABORTED DUE TO EXTENSIVE ENDOMETRIOSIS W/ DENSE PELVIC ADHESIONS INVOLVING UTERUS AND LOWER RECTOSIGMOID  . HYSTEROSCOPY W/D&C  05/06/2009   Allergies  Allergen Reactions  . Adhesive [Tape] Rash  . Latex Itching and Other (See Comments)    Causes blisters   Prior to Admission medications   Medication Sig Start Date End Date Taking? Authorizing Provider  albuterol (PROAIR HFA) 108 (90 Base) MCG/ACT inhaler Inhale 2 puffs into the lungs every 4 (four) hours as needed for wheezing or shortness of breath. 09/12/17   Bobbitt, Ralph Carter, MD  aspirin 81 MG tablet Take 81 mg by mouth daily.    [provider]  blood glucose meter kit and supplies KIT Test blood sugar once daily. Dx code: E11.9 06/23/14   [name], [name] M, MD  cholecalciferol (VITAMIN D) 1000 units tablet Take 1,000 Units by mouth daily.    [provider]  Cyanocobalamin (VITAMIN B-12 CR PO) Take 500 mcg by mouth daily.     [provider]  fexofenadine (ALLEGRA) 180  MG tablet Take 1 tablet (180 mg total) by mouth daily. 10/22/17   Bobbitt, Ralph Carter, MD  fluticasone (FLONASE) 50 MCG/ACT nasal spray Place 1 spray into both nostrils daily. 09/03/17   Bobbitt, Ralph Carter, MD  fluticasone (FLOVENT HFA) 44 MCG/ACT inhaler Inhale 2 puffs into the lungs 2 (two) times daily. With spacer. 10/22/17   Bobbitt, Ralph Carter, MD  glucose blood (ONE TOUCH ULTRA TEST) test strip USE ONE STRIP TO CHECK GLUCOSE ONCE DAILY 10/06/17   [name], [name] M, MD  lisinopril-hydrochlorothiazide (PRINZIDE,ZESTORETIC) 10-12.5 MG tablet Take 1 tablet by mouth daily. 10/06/17   [name], [name] M, MD  lovastatin (MEVACOR) 40 MG tablet Take 1 tablet (40 mg total) by mouth at bedtime. 07/29/17   [name], [name] M, MD  metFORMIN (GLUCOPHAGE) 500 MG   tablet Take 2 tablets (1,000 mg total) by mouth 2 (two) times daily with a meal. Patient taking differently: Take 500 mg by mouth 3 (three) times daily.  09/08/15   [name], MD  metoprolol succinate (TOPROL-XL) 50 MG 24 hr tablet Take 50 mg by mouth daily. 08/19/17   [provider]  Jonetta Speak LANCETS 70Y MISC USE ONE  TO CHECK GLUCOSE ONCE DAILY 10/06/17   [name], MD  tamoxifen (NOLVADEX) 20 MG tablet TAKE 1 TABLET BY MOUTH ONCE DAILY 03/25/17   Nicholas Lose, MD  vitamin C (ASCORBIC ACID) 500 MG tablet Take 500 mg by mouth daily.     [provider]  LISINOPRIL PO Take 1 tablet by mouth daily.  06/13/11  [provider]   Social History   Socioeconomic History  . Marital status: Married    Spouse name: [name]  . Number of children: 4  . Years of education: College  . Highest education level: Not on file  Occupational History  . Occupation: retired  Scientific laboratory technician  . Financial resource strain: Not on file  . Food insecurity:    Worry: Not on file    Inability: Not on file  . Transportation needs:    Medical: Not on file    Non-medical: Not on file  Tobacco Use  . Smoking status: Never Smoker  .  Smokeless tobacco: Never Used  Substance and Sexual Activity  . Alcohol use: No    Alcohol/week: 0.0 standard drinks  . Drug use: No  . Sexual activity: Yes    Comment: menarche 42, P4, Premarin for short while, Provera x 3 years  Lifestyle  . Physical activity:    Days per week: Not on file    Minutes per session: Not on file  . Stress: Not on file  Relationships  . Social connections:    Talks on phone: Not on file    Gets together: Not on file    Attends religious service: Not on file    Active member of club or organization: Not on file    Attends meetings of clubs or organizations: Not on file    Relationship status: Not on file  . Intimate partner violence:    Fear of current or ex partner: Not on file    Emotionally abused: Not on file    Physically abused: Not on file    Forced sexual activity: Not on file  Other Topics Concern  . Not on file  Social History Narrative   Marital status: married x 42  years; happily married; no abuse.      Children: 4 children (2 sons living; 1 son died in 616 at age 41years old, 1 daughter); six grandchildren; no gg.      Lives:  With husband.      Employed: retired in 2011; [employer].      Tobacco: none       Alcohol:  None      Drugs: none      Exercise:  Rowing 3 days per week.      Seatbelt:  100%      Sunscreen:  Face SPF 15.      Guns:  Loaded mostly secured.      ADLs:  Drives minimally; no assistant devices; independent with all ADLs.       Advanced Directives: YES; FULL CODE but no prolonged measures.      Her nocturia is improved under CPAP use  at 10 cm water- AHI is now 0.8 and from 30 at baseline. CMS compliance  6 hours 40 minutes - sleeps on the side, 08-10-11 .FFM - likes it.    . Flonase used prn.     Caffeine Use: 1 cup daily   Review of Systems  Constitutional: Negative for fatigue and unexpected weight change.  Respiratory: Negative for chest tightness and shortness of breath.     Cardiovascular: Negative for chest pain, palpitations and leg swelling.  Gastrointestinal: Negative for abdominal pain and blood in stool.  Neurological: Negative for dizziness, syncope, light-headedness and headaches.      Objective:   Physical Exam  Constitutional: She is oriented to person, place, and time. She appears well-developed and well-nourished.  HENT:  Head: Normocephalic and atraumatic.  Eyes: Pupils are equal, round, and reactive to light. Conjunctivae and EOM are normal.  Neck: Carotid bruit is not present.  Cardiovascular: Normal rate, regular rhythm, normal heart sounds and intact distal pulses.  Pulmonary/Chest: Effort normal and breath sounds normal.  Abdominal: Soft. She exhibits no pulsatile midline mass. There is no tenderness.  Neurological: She is alert and oriented to person, place, and time.  Skin: Skin is warm and dry.  Psychiatric: She has a normal mood and affect. Her behavior is normal.  Vitals reviewed.  Vitals:   10/29/17 0805 10/29/17 0814  BP: (!) 172/74 (!) 160/82  Pulse: 77   Temp: 97.6 F (36.4 C)   TempSrc: Oral   SpO2: 97%   Weight: 226 lb 9.6 oz (102.8 kg)   Height: 5' 1" (1.549 m)    Results for orders placed or performed in visit on 10/29/17  POCT glycosylated hemoglobin (Hb A1C)  Result Value Ref Range   Hemoglobin A1C 7.0 (A) 4.0 - 5.6 %   HbA1c POC (<> result, manual entry)     HbA1c, POC (prediabetic range)     HbA1c, POC (controlled diabetic range)        Assessment & Plan:   [name] is a 72 y.o. female Type 2 diabetes mellitus without complication, without long-term current use of insulin (HCC) - Plan: POCT glycosylated hemoglobin (Hb A1C), Microalbumin, urine, metFORMIN (GLUCOPHAGE) 500 MG tablet  - based on A1c, will try 500mg BID metformin, and possibly lower with exercise.   - check urine microalbumin  - repeat testing 3 months.   Hyperlipidemia, unspecified hyperlipidemia type  - tips for compliance  discussed. Continue same dose statin for now.   Essential hypertension  - suspected white coat HTN component.   BMI 40.0-44.9, adult (HCC)  - exercise discussed and goals.   Meds ordered this encounter  Medications  . metFORMIN (GLUCOPHAGE) 500 MG tablet    Sig: Take 1 tablet (500 mg total) by mouth 2 (two) times daily with a meal.    Dispense:  180 tablet    Refill:  1   Patient Instructions   Try to place meds next to plate/silverware at dinner to help remember to take those meds. We can recheck cholesterol in 3 months.  Based on today's reading, 500mg twice per day is fine and likley can decrease dose with exercise.   Low intensity exercise such as walking as tolerated most days per week.  Ultimate goal would be 150 minutes of exercise per week.   Thank you for coming in today.    IF you received an x-ray today, you will receive an invoice from [facility]. Please contact [facility] at [phone] with questions   or concerns regarding your invoice.   IF you received labwork today, you will receive an invoice from [facility]. Please contact [facility] at [phone] with questions or concerns regarding your invoice.   Our billing staff will not be able to assist you with questions regarding bills from these companies.  You will be contacted with the lab results as soon as they are available. The fastest way to get your results is to activate your My Chart account. Instructions are located on the last page of this paperwork. If you have not heard from us regarding the results in 2 weeks, please contact this office.       I personally performed the services described in this documentation, which was scribed in my presence. The recorded information has been reviewed and considered for accuracy and completeness, addended by me as needed, and agree with information above.  Signed,   Jeffrey Greene, MD Primary Care at [facility].   11/03/17 4:09 PM   

## 2017-10-30 LAB — MICROALBUMIN, URINE: Microalbumin, Urine: <3 ug/mL

## 2017-11-08 DIAGNOSIS — D2361 Other benign neoplasm of skin of right upper limb, including shoulder: Secondary | ICD-10-CM | POA: Diagnosis not present

## 2017-11-08 DIAGNOSIS — D1801 Hemangioma of skin and subcutaneous tissue: Secondary | ICD-10-CM | POA: Diagnosis not present

## 2017-11-08 DIAGNOSIS — L821 Other seborrheic keratosis: Secondary | ICD-10-CM | POA: Diagnosis not present

## 2017-11-15 DIAGNOSIS — C50912 Malignant neoplasm of unspecified site of left female breast: Secondary | ICD-10-CM | POA: Diagnosis not present

## 2017-12-26 ENCOUNTER — Encounter: Payer: Self-pay | Admitting: Family Medicine

## 2017-12-26 DIAGNOSIS — R928 Other abnormal and inconclusive findings on diagnostic imaging of breast: Secondary | ICD-10-CM | POA: Diagnosis not present

## 2017-12-26 DIAGNOSIS — Z853 Personal history of malignant neoplasm of breast: Secondary | ICD-10-CM | POA: Diagnosis not present

## 2018-01-08 ENCOUNTER — Encounter: Payer: Self-pay | Admitting: Family Medicine

## 2018-01-08 DIAGNOSIS — H01022 Squamous blepharitis right lower eyelid: Secondary | ICD-10-CM | POA: Diagnosis not present

## 2018-01-08 DIAGNOSIS — H01021 Squamous blepharitis right upper eyelid: Secondary | ICD-10-CM | POA: Diagnosis not present

## 2018-01-08 DIAGNOSIS — E119 Type 2 diabetes mellitus without complications: Secondary | ICD-10-CM | POA: Diagnosis not present

## 2018-01-08 DIAGNOSIS — H40013 Open angle with borderline findings, low risk, bilateral: Secondary | ICD-10-CM | POA: Diagnosis not present

## 2018-01-08 DIAGNOSIS — H2513 Age-related nuclear cataract, bilateral: Secondary | ICD-10-CM | POA: Diagnosis not present

## 2018-01-08 DIAGNOSIS — H0015 Chalazion left lower eyelid: Secondary | ICD-10-CM | POA: Diagnosis not present

## 2018-01-08 DIAGNOSIS — H01025 Squamous blepharitis left lower eyelid: Secondary | ICD-10-CM | POA: Diagnosis not present

## 2018-01-08 DIAGNOSIS — H01024 Squamous blepharitis left upper eyelid: Secondary | ICD-10-CM | POA: Diagnosis not present

## 2018-01-08 LAB — HM DIABETES EYE EXAM

## 2018-01-29 ENCOUNTER — Ambulatory Visit (INDEPENDENT_AMBULATORY_CARE_PROVIDER_SITE_OTHER): Payer: Medicare Other | Admitting: Family Medicine

## 2018-01-29 ENCOUNTER — Other Ambulatory Visit: Payer: Self-pay

## 2018-01-29 ENCOUNTER — Encounter: Payer: Self-pay | Admitting: Family Medicine

## 2018-01-29 VITALS — BP 135/78 | HR 89 | Temp 98.1°F | Resp 14 | Ht 61.0 in | Wt 220.4 lb

## 2018-01-29 DIAGNOSIS — E785 Hyperlipidemia, unspecified: Secondary | ICD-10-CM

## 2018-01-29 DIAGNOSIS — T782XXA Anaphylactic shock, unspecified, initial encounter: Secondary | ICD-10-CM | POA: Diagnosis not present

## 2018-01-29 DIAGNOSIS — E119 Type 2 diabetes mellitus without complications: Secondary | ICD-10-CM

## 2018-01-29 DIAGNOSIS — J4530 Mild persistent asthma, uncomplicated: Secondary | ICD-10-CM

## 2018-01-29 DIAGNOSIS — Z23 Encounter for immunization: Secondary | ICD-10-CM

## 2018-01-29 LAB — POCT GLYCOSYLATED HEMOGLOBIN (HGB A1C): HEMOGLOBIN A1C: 7.2 % — AB (ref 4.0–5.6)

## 2018-01-29 MED ORDER — DULAGLUTIDE 0.75 MG/0.5ML ~~LOC~~ SOAJ: 0.7500 mg | SUBCUTANEOUS | 3 refills | Status: DC

## 2018-01-29 NOTE — Progress Notes (Signed)
Subjective:  By signing my name below, I, [name], attest that this documentation has been prepared under the direction and in the presence of [name], MD. Electronically Signed: [name], [place]. 01/29/2018 , 9:02 AM .  Patient was seen in Room 14 .   Patient ID: [name], female    DOB: 1945/05/16, 72 y.o.   MRN: [id number] Chief Complaint  Patient presents with  . Diabetes   HPI [name] is a 72 y.o. female Here for follow up. She is fasting today.   Diabetes She did have some difficulty with diarrhea previously. She was taking about 1000 mg total a day when seen in August; her last A1C was 7.0 so we decreased to metformin 500 mg bid; recommended increased exercise.   Lab Results  Component Value Date   HGBA1C 7.0 (A) 10/29/2017   Patient states she's still having diarrhea with metformin. She mostly takes it just once in the morning, and has periodic diarrhea with sensation of her stomach churning. She mentions noticing having some UTI's with soft drinks. She walks around the house only when she is watching TV during commercial breaks.   Wt Readings from Last 3 Encounters:  01/29/18 220 lb 6.4 oz (100 kg)  10/29/17 226 lb 9.6 oz (102.8 kg)  09/03/17 227 lb 3.2 oz (103.1 kg)   Foot: Aug 2019  Optho: 01/08/18 Pneumovax: Jan 2019  Urine micro albumin: normal, Aug 2019  Cough She also wants to discuss coughing. She has history of allergies and asthma, treated with Allegra, Flonase and Flovent previously; she's on an ace inhibitor. She's been evaluated recently by allergist, Dr. Verlin Fester; noted peanut allergy. She was started on Flovent 2 puffs bid at Aug visit due to persistent symptoms, and albuterol HFA prn. She was also continued on Allegra and Flonase prn. Spirometry had FEV1 1.55 L (80% predicted) with 5% postbronchodilator improvement.   She's been having productive clear phlegm with her cough with some wheezing. She's tried changing the  ace inhibitor for a couple weeks without improvement. She hasn't been her Flovent inhaler. She was using albuterol inhaler daily for a while as she had some leftover. She didn't have much relief with Allegra, so has been taking loratadine. She was also given an epi-pen for peanut allergy by allergist.   Patient Active Problem List   Diagnosis Date Noted  . History of food allergy 10/22/2017  . Anaphylaxis 10/22/2017  . Food allergy 09/03/2017  . Mild persistent asthma 09/03/2017  . Spinal stenosis of lumbar region with neurogenic claudication 08/01/2016  . Pure hypercholesterolemia 08/01/2016  . Osteopenia 10/18/2014  . Obesity (BMI 30-39.9) 06/22/2014  . Perennial allergic rhinitis 03/23/2013  . Cancer of central portion of left female breast ([place]) 08/25/2012  . Obstructive sleep apnea on CPAP 04/21/2012  . Diabetes ([place]) 12/28/2011  . HTN (hypertension) 12/28/2011  . Uterine bleeding 04/30/2011   Past Medical History:  Diagnosis Date  . Allergic rhinitis 03/23/2013  . Allergy   . Anemia    due to vaginal bleeding  . Arthritis    hands  . Asthmatic bronchitis 09/03/2017  . Breast cancer ([place]) 08/2012   left  . Dental crowns present   . Eczema   . Fibroids   . Headache(784.0)    tension  . History of endometriosis   . Hyperlipidemia   . Hypertension    under control with med., has been on med. x 3 yr.  . OSA (obstructive sleep apnea)   .  Postmenopausal bleeding   . Recurrent upper respiratory infection (URI)   . S/P radiation therapy 10/20/2012-12/05/2012   left breast 50.4 gray, lumpectomy cavity boosted to 62.4 gray  . Seasonal allergies   . Sleep apnea    uses CPAP nightly  . Urinary urgency   . White coat hypertension    Past Surgical History:  Procedure Laterality Date  . BACK SURGERY  1997   lumbar  . BREAST LUMPECTOMY WITH NEEDLE LOCALIZATION AND AXILLARY SENTINEL LYMPH NODE BX Left 09/03/2012   Procedure: BREAST LUMPECTOMY WITH NEEDLE LOCALIZATION AND AXILLARY  SENTINEL LYMPH NODE BX;  Surgeon: Edward Jolly, MD;  Location: [facility];  Service: General;  Laterality: Left;  . CHOLECYSTECTOMY  1995  . COLONOSCOPY W/ POLYPECTOMY  03/20/2007  . DILATION AND CURETTAGE OF UTERUS  2005  . DILATION AND CURETTAGE OF UTERUS  06/22/2011   Procedure: DILATATION AND CURETTAGE;  Surgeon: Alvino Chapel, MD;  Location: [facility];  Service: Gynecology;  Laterality: N/A;  OK PER KEELA FOR 7:15 START  . DORSAL COMPARTMENT RELEASE Right 05/19/2013   Procedure: RELEASE 1ST  DORSAL COMPARTMENT RIGHT (DEQUERVAIN);  Surgeon: Cammie Sickle., MD;  Location: [facility];  Service: Orthopedics;  Laterality: Right;  . EXPLORATORY LAPAROTOMY  06-30-2007   ATTEMPTED HYSTERECTOMY ABORTED DUE TO EXTENSIVE ENDOMETRIOSIS W/ DENSE PELVIC ADHESIONS INVOLVING UTERUS AND LOWER RECTOSIGMOID  . HYSTEROSCOPY W/D&C  05/06/2009   Allergies  Allergen Reactions  . Adhesive [Tape] Rash  . Latex Itching and Other (See Comments)    Causes blisters   Prior to Admission medications   Medication Sig Start Date End Date Taking? Authorizing Provider  aspirin 81 MG tablet Take 81 mg by mouth daily.    [provider]  blood glucose meter kit and supplies KIT Test blood sugar once daily. Dx code: E11.9 06/23/14   [name], MD  cholecalciferol (VITAMIN D) 1000 units tablet Take 1,000 Units by mouth daily.    [provider]  Cyanocobalamin (VITAMIN B-12 CR PO) Take 500 mcg by mouth daily.     [provider]  fexofenadine (ALLEGRA) 180 MG tablet Take 1 tablet (180 mg total) by mouth daily. 10/22/17   Bobbitt, [place] Muta, MD  fluticasone (FLONASE) 50 MCG/ACT nasal spray Place 1 spray into both nostrils daily. 09/03/17   Bobbitt, [place] Muta, MD  fluticasone (FLOVENT HFA) 44 MCG/ACT inhaler Inhale 2 puffs into the lungs 2 (two) times daily. With spacer. 10/22/17   Bobbitt, [place]alia Muta, MD  glucose blood (ONE TOUCH  ULTRA TEST) test strip USE ONE STRIP TO CHECK GLUCOSE ONCE DAILY 10/06/17   [name], MD  lisinopril-hydrochlorothiazide (PRINZIDE,ZESTORETIC) 10-12.5 MG tablet Take 1 tablet by mouth daily. 10/06/17   [name], MD  lovastatin (MEVACOR) 40 MG tablet Take 1 tablet (40 mg total) by mouth at bedtime. 07/29/17   [name], MD  metFORMIN (GLUCOPHAGE) 500 MG tablet Take 1 tablet (500 mg total) by mouth 2 (two) times daily with a meal. 10/29/17   Wendie Agreste, MD  metoprolol succinate (TOPROL-XL) 50 MG 24 hr tablet Take 50 mg by mouth daily. 08/19/17   [provider]  Jonetta Speak LANCETS 16R MISC USE ONE  TO CHECK GLUCOSE ONCE DAILY 10/06/17   [name], MD  tamoxifen (NOLVADEX) 20 MG tablet TAKE 1 TABLET BY MOUTH ONCE DAILY 03/25/17   Nicholas Lose, MD  vitamin C (ASCORBIC ACID) 500 MG tablet  Take 500 mg by mouth daily.     [provider]  LISINOPRIL PO Take 1 tablet by mouth daily.  06/13/11  [provider]   Social History   Socioeconomic History  . Marital status: Married    Spouse name: [name]  . Number of children: 4  . Years of education: College  . Highest education level: Not on file  Occupational History  . Occupation: retired  Scientific laboratory technician  . Financial resource strain: Not on file  . Food insecurity:    Worry: Not on file    Inability: Not on file  . Transportation needs:    Medical: Not on file    Non-medical: Not on file  Tobacco Use  . Smoking status: Never Smoker  . Smokeless tobacco: Never Used  Substance and Sexual Activity  . Alcohol use: No    Alcohol/week: 0.0 standard drinks  . Drug use: No  . Sexual activity: Yes    Comment: menarche 70, P4, Premarin for short while, Provera x 3 years  Lifestyle  . Physical activity:    Days per week: Not on file    Minutes per session: Not on file  . Stress: Not on file  Relationships  . Social connections:    Talks on phone: Not on file    Gets together: Not on file      Attends religious service: Not on file    Active member of club or organization: Not on file    Attends meetings of clubs or organizations: Not on file    Relationship status: Not on file  . Intimate partner violence:    Fear of current or ex partner: Not on file    Emotionally abused: Not on file    Physically abused: Not on file    Forced sexual activity: Not on file  Other Topics Concern  . Not on file  Social History Narrative   Marital status: married x 28  years; happily married; no abuse.      Children: 4 children (2 sons living; 1 son died in 6734 at age 71years old, 1 daughter); six grandchildren; no gg.      Lives:  With husband.      Employed: retired in 2011; [employer].      Tobacco: none       Alcohol:  None      Drugs: none      Exercise:  Rowing 3 days per week.      Seatbelt:  100%      Sunscreen:  Face SPF 15.      Guns:  Loaded mostly secured.      ADLs:  Drives minimally; no assistant devices; independent with all ADLs.       Advanced Directives: YES; FULL CODE but no prolonged measures.      Her nocturia is improved under CPAP use at 10 cm water- AHI is now 0.8 and from 30 at baseline. CMS compliance  6 hours 40 minutes - sleeps on the side, 08-10-11 .FFM - likes it.    . Flonase used prn.     Caffeine Use: 1 cup daily   Review of Systems  Constitutional: Negative for fatigue, fever and unexpected weight change.  Respiratory: Positive for cough. Negative for chest tightness, shortness of breath and wheezing.   Cardiovascular: Negative for chest pain, palpitations and leg swelling.  Gastrointestinal: Negative for abdominal pain and blood in stool.  Neurological: Negative for dizziness, syncope, light-headedness and  headaches.       Objective:   Physical Exam  Constitutional: She is oriented to person, place, and time. She appears well-developed and well-nourished.  HENT:  Head: Normocephalic and atraumatic.  Eyes: Pupils are  equal, round, and reactive to light. Conjunctivae and EOM are normal.  Neck: Carotid bruit is not present.  Cardiovascular: Normal rate, regular rhythm, normal heart sounds and intact distal pulses.  Pulmonary/Chest: Effort normal and breath sounds normal. She has no wheezes.  Abdominal: Soft. She exhibits no pulsatile midline mass. There is no tenderness.  Musculoskeletal: She exhibits edema (trace non pitting edema at the ankles).  Neurological: She is alert and oriented to person, place, and time.  Skin: Skin is warm and dry.  Psychiatric: She has a normal mood and affect. Her behavior is normal.  Vitals reviewed.   Vitals:   01/29/18 0817 01/29/18 0821  BP: (!) 152/82 135/78  Pulse: 89   Resp: 14   Temp: 98.1 F (36.7 C)   TempSrc: Oral   SpO2: 98%   Weight: 220 lb 6.4 oz (100 kg)   Height: 5' 1" (1.549 m)    Results for orders placed or performed in visit on 01/29/18  POCT glycosylated hemoglobin (Hb A1C)  Result Value Ref Range   Hemoglobin A1C 7.2 (A) 4.0 - 5.6 %   HbA1c POC (<> result, manual entry)     HbA1c, POC (prediabetic range)     HbA1c, POC (controlled diabetic range)         Assessment & Plan:   [name] is a 72 y.o. female Type 2 diabetes mellitus without complication, without long-term current use of insulin ([place]) - Plan: POCT glycosylated hemoglobin (Hb A1C), Lipid panel, Dulaglutide (TRULICITY) 1.61 WR/6.0AV SOPN, CANCELED: COMPLETE METABOLIC PANEL WITH GFR  -Intolerant to metformin due to diarrhea.  Initially prescribed Trulicity, but that appears to be cost prohibitive.  Option of Januvia discussed and will check with pharmacy.  Labs pending  Hyperlipidemia, unspecified hyperlipidemia type - Plan: Lipid panel, Comprehensive metabolic panel, CANCELED: COMPLETE METABOLIC PANEL WITH GFR  - Stable, tolerating current regimen. Medications refilled. Labs pending as above.   Mild persistent asthma, unspecified whether complicated  -Cough likely  related to uncontrolled asthma.  Flovent dosing discussed per allergist recommendations.  Continue Allegra and Flonase for allergies, Flovent 2 puffs twice daily, then follow-up in the next week or 2 if not improving symptoms. rtc precautions if worse.   Meds ordered this encounter  Medications  . Dulaglutide (TRULICITY) 4.09 WJ/1.9JY SOPN    Sig: Inject 0.75 mg into the skin every 7 (seven) days.    Dispense:  2 mL    Refill:  3   Patient Instructions    Cough may be related to asthma control.  Your allergist recommended starting Flovent 2 puffs twice per day, with albuterol as needed. If those are cost prohibitive, please contact your allergist as samples, coupons or alternate med options may be needed. Continue allegra and flonase for allergies as needed. If cough not improved with starting asthma treatment in next few weeks - return here or allergist.   Stop metformin, start Trulicity.  Let me know if that is cost prohibitive and we can look at other options including the pill Januvia as we discussed.  Recheck 3 months  If you have lab work done today you will be contacted with your lab results within the next 2 weeks.  If you have not heard from [place] then please contact us.  The fastest way to get your results is to register for My Chart.   IF you received an x-ray today, you will receive an invoice from [facility]. Please contact [facility] at [phone] with questions or concerns regarding your invoice.   IF you received labwork today, you will receive an invoice from [facility]. Please contact [facility] at [phone] with questions or concerns regarding your invoice.   Our billing staff will not be able to assist you with questions regarding bills from these companies.  You will be contacted with the lab results as soon as they are available. The fastest way to get your results is to activate your My Chart account. Instructions are located on the last page of this  paperwork. If you have not heard from [place] regarding the results in 2 weeks, please contact this office.       I personally performed the services described in this documentation, which was scribed in my presence. The recorded information has been reviewed and considered for accuracy and completeness, addended by me as needed, and agree with information above.  Signed,   [name], MD [facility] [place].  02/03/18 11:33 AM

## 2018-01-29 NOTE — Patient Instructions (Addendum)
  Cough may be related to asthma control.  Your allergist recommended starting Flovent 2 puffs twice per day, with albuterol as needed. If those are cost prohibitive, please contact your allergist as samples, coupons or alternate med options may be needed. Continue allegra and flonase for allergies as needed. If cough not improved with starting asthma treatment in next few weeks - return here or allergist.   Stop metformin, start Trulicity.  Let me know if that is cost prohibitive and we can look at other options including the pill Januvia as we discussed.  Recheck 3 months  If you have lab work done today you will be contacted with your lab results within the next 2 weeks.  If you have not heard from [place] then please contact us. The fastest way to get your results is to register for My Chart.   IF you received an x-ray today, you will receive an invoice from [facility]. Please contact [facility] at [phone] with questions or concerns regarding your invoice.   IF you received labwork today, you will receive an invoice from [place]. Please contact [facility] at [phone] with questions or concerns regarding your invoice.   Our billing staff will not be able to assist you with questions regarding bills from these companies.  You will be contacted with the lab results as soon as they are available. The fastest way to get your results is to activate your My Chart account. Instructions are located on the last page of this paperwork. If you have not heard from [place] regarding the results in 2 weeks, please contact this office.

## 2018-01-30 ENCOUNTER — Telehealth: Payer: Self-pay | Admitting: Family Medicine

## 2018-01-30 LAB — LIPID PANEL
Chol/HDL Ratio: 4.5 ratio — ABNORMAL HIGH (ref 0.0–4.4)
Cholesterol, Total: 197 mg/dL (ref 100–199)
HDL: 44 mg/dL (ref >39)
LDL Calculated: 121 mg/dL — ABNORMAL HIGH (ref 0–99)
Triglycerides: 159 mg/dL — ABNORMAL HIGH (ref 0–149)
VLDL Cholesterol Cal: 32 mg/dL (ref 5–40)

## 2018-01-30 LAB — COMPREHENSIVE METABOLIC PANEL
ALT: 16 IU/L (ref 0–32)
AST: 30 IU/L (ref 0–40)
Albumin/Globulin Ratio: 1.4 (ref 1.2–2.2)
Albumin: 3.8 g/dL (ref 3.5–4.8)
Alkaline Phosphatase: 76 IU/L (ref 39–117)
BUN/Creatinine Ratio: 17 (ref 12–28)
BUN: 12 mg/dL (ref 8–27)
Bilirubin Total: 0.6 mg/dL (ref 0.0–1.2)
CALCIUM: 9.5 mg/dL (ref 8.7–10.3)
CHLORIDE: 98 mmol/L (ref 96–106)
CO2: 23 mmol/L (ref 20–29)
Creatinine, Ser: 0.72 mg/dL (ref 0.57–1.00)
GFR, EST AFRICAN AMERICAN: 96 mL/min/{1.73_m2} (ref >59)
GFR, EST NON AFRICAN AMERICAN: 83 mL/min/{1.73_m2} (ref >59)
Globulin, Total: 2.8 g/dL (ref 1.5–4.5)
Glucose: 145 mg/dL — ABNORMAL HIGH (ref 65–99)
POTASSIUM: 3.9 mmol/L (ref 3.5–5.2)
Sodium: 138 mmol/L (ref 134–144)
TOTAL PROTEIN: 6.6 g/dL (ref 6.0–8.5)

## 2018-01-30 NOTE — Telephone Encounter (Signed)
Copied from [place] [phone]. Topic: General - Other >> Jan 30, 2018 10:54 AM Janace Aris A wrote: Reason for CRM: Pt called in wanting to alert Dr. Carlota Raspberry that she is going to have to just stick to taking the metformin, because the medication Dulaglutide (TRULICITY) 7.53 DF/1.7HE SOPN that was prescribed to her is too expensive. She says it's about $400.00 monthly.

## 2018-02-02 NOTE — Telephone Encounter (Signed)
Message sent to Dr. Carlota Raspberry Re: continue Metformin as Trulicity too expensive

## 2018-02-03 NOTE — Telephone Encounter (Signed)
Metformin was causing diarrhea.  We do have other options besides Trulicity.  One option would be Januvia 100 mg/day.  Please check with her pharmacy to see if that would be less costly.

## 2018-02-06 ENCOUNTER — Telehealth: Payer: Self-pay | Admitting: *Deleted

## 2018-02-06 ENCOUNTER — Telehealth: Payer: Self-pay | Admitting: Family Medicine

## 2018-02-06 NOTE — Telephone Encounter (Signed)
Copied from [place] [phone]. Topic: General - Other >> Feb 06, 2018 11:29 AM Carolyn Stare wrote:   Pt call to let Mariann Laster know not to order [facility] because it it to expensive

## 2018-02-06 NOTE — Telephone Encounter (Signed)
This may be due to the end of the year costs and may have potions starting Jan 1.  Let me look into it further. Her A1c was only borderline elevated, so certainly diet approach with close follow-up is reasonable.  She can also take half of the 500 mg metformin once or twice per day to see if that is tolerated better.

## 2018-02-06 NOTE — Telephone Encounter (Signed)
Left message on pt's voicemail to contact our office regarding medication. See note.

## 2018-02-06 NOTE — Telephone Encounter (Signed)
Duplicate message. 

## 2018-02-06 NOTE — Telephone Encounter (Signed)
Dr. Carlota Raspberry, I spoke with the patient, she stated that the [place] for 1 month is $365.64 and for 90 days it will be $600. Metformin is $5.40. " I am not going to worry about it. I'am 72 yrs old and am about at the end of my life." I think that the insurance company just want [place] to die. "Tell Dr. Carlota Raspberry that I'am going to change my diet, eat better, I can't afford the medicine."

## 2018-02-06 NOTE — Telephone Encounter (Signed)
Pt called to the office, she talked with pharmacy the price for Januvia 100 mg is $500.00, more expensive than Trulicity. She is going to call the insurance co and she will call back.

## 2018-02-10 NOTE — Telephone Encounter (Signed)
Left a messge for patient to return call. When patient call back please tell patient below msg.  This may be due to the end of the year costs and may have potions starting Jan 1.  Let me look into it further. Her A1c was only borderline elevated, so certainly diet approach with close follow-up is reasonable.  She can also take half of the 500 mg metformin once or twice per day to see if that is tolerated better.

## 2018-02-18 DIAGNOSIS — I471 Supraventricular tachycardia: Secondary | ICD-10-CM | POA: Diagnosis not present

## 2018-02-18 DIAGNOSIS — I493 Ventricular premature depolarization: Secondary | ICD-10-CM | POA: Diagnosis not present

## 2018-02-18 DIAGNOSIS — E7800 Pure hypercholesterolemia, unspecified: Secondary | ICD-10-CM | POA: Diagnosis not present

## 2018-02-18 DIAGNOSIS — I10 Essential (primary) hypertension: Secondary | ICD-10-CM | POA: Diagnosis not present

## 2018-03-26 DIAGNOSIS — M545 Low back pain: Secondary | ICD-10-CM | POA: Diagnosis not present

## 2018-05-09 ENCOUNTER — Inpatient Hospital Stay: Payer: Medicare Other | Admitting: Hematology and Oncology

## 2018-05-12 ENCOUNTER — Telehealth: Payer: Self-pay | Admitting: Hematology and Oncology

## 2018-05-12 ENCOUNTER — Inpatient Hospital Stay: Payer: Medicare Other | Attending: Hematology and Oncology | Admitting: Hematology and Oncology

## 2018-05-12 ENCOUNTER — Ambulatory Visit: Payer: Medicare Other | Admitting: Adult Health

## 2018-05-12 DIAGNOSIS — Z170 Estrogen receptor positive status [ER+]: Secondary | ICD-10-CM | POA: Diagnosis not present

## 2018-05-12 DIAGNOSIS — C50112 Malignant neoplasm of central portion of left female breast: Secondary | ICD-10-CM | POA: Diagnosis not present

## 2018-05-12 DIAGNOSIS — Z79810 Long term (current) use of selective estrogen receptor modulators (SERMs): Secondary | ICD-10-CM | POA: Diagnosis not present

## 2018-05-12 MED ORDER — LORATADINE 10 MG PO TABS: 10.0000 mg | ORAL_TABLET | Freq: Every day | ORAL | Status: AC

## 2018-05-12 MED ORDER — TAMOXIFEN CITRATE 10 MG PO TABS: 10.0000 mg | ORAL_TABLET | Freq: Every day | ORAL | 3 refills | Status: DC

## 2018-05-12 MED ORDER — METOPROLOL SUCCINATE ER 25 MG PO TB24: 25.0000 mg | ORAL_TABLET | Freq: Every day | ORAL | 0 refills | Status: DC

## 2018-05-12 NOTE — Assessment & Plan Note (Signed)
Left breast invasive lobular cancerER/PR positive HER-2 negative (initial biopsy showed HER-2 positive but final pathology on the lumpectomy did not) status post left breast lumpectomy and radiation and currently on antiestrogen therapy with Arimidex started 12/01/2012.  Tamoxifentoxicities:Marked improvement in her ability to tolerate the medication. Denies any significant hot flashes. She does have muscle aches especially in the lower back but they're not as bad as before.  Right humerus fracture: after fall. Much improved.  Breast cancer surveillance: 1. Breast exam2/24/2020is benign 2. Mammogram 10/29/2019is benign breast density category B 3. Bone density June 2016: T score -1.4  Return to clinic in 1 year for follow-up

## 2018-05-12 NOTE — Progress Notes (Signed)
Patient Care Team: Wendie Agreste, MD as PCP - General (Family Medicine) [name], MD (Family Medicine)  DIAGNOSIS:  Encounter Diagnosis  Name Primary?  . Malignant neoplasm of central portion of left breast in female, estrogen receptor positive ([place])     SUMMARY OF ONCOLOGIC HISTORY:   Cancer of central portion of left female breast ([place])   08/13/2012 Mammogram    Questionable mass deep to the nipple superior periareolar region with calcifications.U/S 5 x 8 mm mass at 12:00, ovoid cystic lesion at 11:00 1.5X 0.4 cm    08/25/2012 Initial Diagnosis    Invasive mammary cancer with mammary cancer in situ grade 2 with lobular features ER 100%, PR 63%, Ki-67 58%, HER-2 positive ratio 2.48    08/26/2012 Breast MRI    Dominant, biopsied mass with small immediately adjacent satellite mass consistent with biopsy results indicating malignancy, measuring 17 x 11 x 12m    09/03/2012 Surgery    Left breast lumpectomy invasive lobular cancer with lymphovascular invasion and lobular carcinoma in situ positive margins one SLN negative, grade 2; 1.6 cm, ER 100%, PR 63%, HER-2 positive, Ki-67 58% T1 C. N0 M0 stage IA    10/21/2012 - 11/28/2012 Radiation Therapy    Radiation therapy to the lumpectomy site    12/01/2012 -  Anti-estrogen oral therapy    Arimidex 1 mg daily planned duration of treatment 5 years, switched to letrozole January 2016 for musculoskeletal aches and pains, switched to Tamoxifen 07/07/15     CHIEF COMPLIANT: Follow-up on tamoxifen therapy  INTERVAL HISTORY: Diane SHIPLETTis a [age]year old above-mentioned history of left breast density with lumpectomy radiation is currently on tamoxifen.  She is tolerating it amazingly well.  She does not have any hot flashes or myalgias.  Denies any lumps or nodules in the breast.  REVIEW OF SYSTEMS:   Constitutional: Denies fevers, chills or abnormal weight loss Eyes: Denies blurriness of vision Ears, nose, mouth, throat, and face:  Denies mucositis or sore throat Respiratory: Denies cough, dyspnea or wheezes Cardiovascular: Denies palpitation, chest discomfort Gastrointestinal:  Denies nausea, heartburn or change in bowel habits Skin: Denies abnormal skin rashes Lymphatics: Denies new lymphadenopathy or easy bruising Neurological:Denies numbness, tingling or new weaknesses Behavioral/Psych: Mood is stable, no new changes  Extremities: No lower extremity edema Breast:  denies any pain or lumps or nodules in either breasts All other systems were reviewed with the patient and are negative.  I have reviewed the past medical history, past surgical history, social history and family history with the patient and they are unchanged from previous note.  ALLERGIES:  is allergic to adhesive [tape] and latex.  MEDICATIONS:  Current Outpatient Medications  Medication Sig Dispense Refill  . aspirin 81 MG tablet Take 81 mg by mouth daily.    . blood glucose meter kit and supplies KIT Test blood sugar once daily. Dx code: E11.9 1 each 0  . cholecalciferol (VITAMIN D) 1000 units tablet Take 1,000 Units by mouth daily.    . Cyanocobalamin (VITAMIN B-12 CR PO) Take 500 mcg by mouth daily.     . Dulaglutide (TRULICITY) 04.74MQV/9.5GLSOPN Inject 0.75 mg into the skin every 7 (seven) days. 2 mL 3  . fexofenadine (ALLEGRA) 180 MG tablet Take 1 tablet (180 mg total) by mouth daily. 90 tablet 2  . fluticasone (FLONASE) 50 MCG/ACT nasal spray Place 1 spray into both nostrils daily. 16 g 5  . fluticasone (FLOVENT HFA) 44 MCG/ACT inhaler Inhale 2  puffs into the lungs 2 (two) times daily. With spacer. 1 Inhaler 5  . glucose blood (ONE TOUCH ULTRA TEST) test strip USE ONE STRIP TO CHECK GLUCOSE ONCE DAILY 100 each 3  . lisinopril-hydrochlorothiazide (PRINZIDE,ZESTORETIC) 10-12.5 MG tablet Take 1 tablet by mouth daily. 90 tablet 1  . lovastatin (MEVACOR) 40 MG tablet Take 1 tablet (40 mg total) by mouth at bedtime. 90 tablet 1  . metoprolol  succinate (TOPROL-XL) 50 MG 24 hr tablet Take 50 mg by mouth daily.  2  . ONETOUCH DELICA LANCETS 12Y MISC USE ONE  TO CHECK GLUCOSE ONCE DAILY 100 each 2  . tamoxifen (NOLVADEX) 20 MG tablet TAKE 1 TABLET BY MOUTH ONCE DAILY 180 tablet 0  . vitamin C (ASCORBIC ACID) 500 MG tablet Take 500 mg by mouth daily.      Current Facility-Administered Medications  Medication Dose Route Frequency Provider Last Rate Last Dose  . EPINEPHrine (Anaphylaxis) SOLN 0.3 mg  0.3 mg Intramuscular Once Bobbitt, Sedalia Muta, MD        PHYSICAL EXAMINATION: ECOG PERFORMANCE STATUS: 0 - Asymptomatic  Vitals:   05/12/18 1515  BP: 138/78  Pulse: 79  Resp: 18  Temp: 97.8 F (36.6 C)  SpO2: 100%   Filed Weights   05/12/18 1515  Weight: 227 lb 1.6 oz (103 kg)    GENERAL:alert, no distress and comfortable SKIN: skin color, texture, turgor are normal, no rashes or significant lesions EYES: normal, Conjunctiva are pink and non-injected, sclera clear OROPHARYNX:no exudate, no erythema and lips, buccal mucosa, and tongue normal  NECK: supple, thyroid normal size, non-tender, without nodularity LYMPH:  no palpable lymphadenopathy in the cervical, axillary or inguinal LUNGS: clear to auscultation and percussion with normal breathing effort HEART: regular rate & rhythm and no murmurs and no lower extremity edema ABDOMEN:abdomen soft, non-tender and normal bowel sounds MUSCULOSKELETAL:no cyanosis of digits and no clubbing  NEURO: alert & oriented x 3 with fluent speech, no focal motor/sensory deficits EXTREMITIES: No lower extremity edema BREAST: No palpable masses or nodules in either right or left breasts. No palpable axillary supraclavicular or infraclavicular adenopathy no breast tenderness or nipple discharge. (exam performed in the presence of a chaperone)  LABORATORY DATA:  I have reviewed the data as listed CMP Latest Ref Rng & Units 01/29/2018 07/29/2017 04/15/2017  Glucose 65 - 99 mg/dL 145(H) 128(H)  144(H)  BUN 8 - 27 mg/dL 12 11 12   Creatinine 0.57 - 1.00 mg/dL 0.72 0.79 0.73  Sodium 134 - 144 mmol/L 138 137 135  Potassium 3.5 - 5.2 mmol/L 3.9 4.1 3.9  Chloride 96 - 106 mmol/L 98 98 96  CO2 20 - 29 mmol/L 23 24 22   Calcium 8.7 - 10.3 mg/dL 9.5 9.6 9.6  Total Protein 6.0 - 8.5 g/dL 6.6 7.0 7.0  Total Bilirubin 0.0 - 1.2 mg/dL 0.6 0.6 0.6  Alkaline Phos 39 - 117 IU/L 76 74 86  AST 0 - 40 IU/L 30 38 36  ALT 0 - 32 IU/L 16 22 22     Lab Results  Component Value Date   WBC 5.5 07/29/2017   HGB 13.0 07/29/2017   HCT 38.1 07/29/2017   MCV 87 07/29/2017   PLT 228 07/29/2017   NEUTROABS 3.3 07/29/2017    ASSESSMENT & PLAN:  Cancer of central portion of left female breast Left breast invasive lobular cancerER/PR positive HER-2 negative (initial biopsy showed HER-2 positive but final pathology on the lumpectomy did not) status post left breast lumpectomy and radiation  and currently on antiestrogen therapy with Arimidex started 12/01/2012.  Tamoxifentoxicities:Marked improvement in her ability to tolerate the medication. Denies any significant hot flashes. She does have muscle aches especially in the lower back but they're not as bad as before.  Right humerus fracture: after fall. Much improved.  Breast cancer surveillance: 1. Breast exam2/24/2020is benign 2. Mammogram 10/29/2019is benign breast density category B 3. Bone density June 2016: T score -1.4  Return to clinic in 1 year for follow-up    No orders of the defined types were placed in this encounter.  The patient has a good understanding of the overall plan. she agrees with it. she will call with any problems that may develop before the next visit here.   Harriette Ohara, MD 05/12/18

## 2018-05-12 NOTE — Telephone Encounter (Signed)
Gave avs and calendar ° °

## 2018-05-13 ENCOUNTER — Ambulatory Visit: Payer: Medicare Other | Admitting: Family Medicine

## 2018-05-13 DIAGNOSIS — C50919 Malignant neoplasm of unspecified site of unspecified female breast: Secondary | ICD-10-CM | POA: Diagnosis not present

## 2018-05-13 DIAGNOSIS — Z0000 Encounter for general adult medical examination without abnormal findings: Secondary | ICD-10-CM | POA: Diagnosis not present

## 2018-05-13 DIAGNOSIS — E119 Type 2 diabetes mellitus without complications: Secondary | ICD-10-CM | POA: Diagnosis not present

## 2018-05-13 DIAGNOSIS — J309 Allergic rhinitis, unspecified: Secondary | ICD-10-CM | POA: Diagnosis not present

## 2018-05-13 DIAGNOSIS — I10 Essential (primary) hypertension: Secondary | ICD-10-CM | POA: Diagnosis not present

## 2018-05-21 DIAGNOSIS — E119 Type 2 diabetes mellitus without complications: Secondary | ICD-10-CM | POA: Diagnosis not present

## 2018-06-04 DIAGNOSIS — I10 Essential (primary) hypertension: Secondary | ICD-10-CM | POA: Diagnosis not present

## 2018-06-04 DIAGNOSIS — M25562 Pain in left knee: Secondary | ICD-10-CM | POA: Diagnosis not present

## 2018-06-04 DIAGNOSIS — J309 Allergic rhinitis, unspecified: Secondary | ICD-10-CM | POA: Diagnosis not present

## 2018-06-04 DIAGNOSIS — E119 Type 2 diabetes mellitus without complications: Secondary | ICD-10-CM | POA: Diagnosis not present

## 2018-06-08 DIAGNOSIS — E119 Type 2 diabetes mellitus without complications: Secondary | ICD-10-CM | POA: Diagnosis not present

## 2018-06-08 DIAGNOSIS — I10 Essential (primary) hypertension: Secondary | ICD-10-CM | POA: Diagnosis not present

## 2018-06-08 DIAGNOSIS — M25562 Pain in left knee: Secondary | ICD-10-CM | POA: Diagnosis not present

## 2018-06-08 DIAGNOSIS — J309 Allergic rhinitis, unspecified: Secondary | ICD-10-CM | POA: Diagnosis not present

## 2018-07-02 ENCOUNTER — Encounter: Payer: Self-pay | Admitting: Neurology

## 2018-07-02 ENCOUNTER — Telehealth: Payer: Self-pay | Admitting: Neurology

## 2018-07-02 NOTE — Telephone Encounter (Signed)
Due to current COVID 19 pandemic, our office is severely reducing in office visits until further notice, in order to minimize the risk to our patients and healthcare providers.   Called patient today and offered a sooner appointment via virtual visit with Dr. Brett Fairy instead of [facility] NP due to MM out of office. Patient agreed to the virtual visit and verbalized understanding of the steps involved in setting up and connecting to the [facility] meeting from her device. I stayed on the phone with patient while she downloaded the Webex app to her smartphone. I advised patient that she will receive calls prior to her appointment from RN to update chart as well as from office staff to check her in. Patient agreed.  Pt understands that although there may be some limitations with this type of visit, we will take all precautions to reduce any security or privacy concerns.  Pt understands that this will be treated like an in office visit and we will file with pt's insurance, and there may be a patient responsible charge related to this service.  Pt's email is [email or web address]. Pt understands that the [employer] webex software must be downloaded and operational on the device pt plans to use for the visit.

## 2018-07-02 NOTE — Telephone Encounter (Signed)
Called the patient and reviewed her chart with her made sure everything was up to date. I advised the patient I will send the sleep scale through her mychart to complete prior to her apt. Pt and I also completed a test run through with the app and it worked so she should be all set for apt

## 2018-07-08 ENCOUNTER — Ambulatory Visit (INDEPENDENT_AMBULATORY_CARE_PROVIDER_SITE_OTHER): Payer: Medicare Other | Admitting: Neurology

## 2018-07-08 ENCOUNTER — Encounter: Payer: Self-pay | Admitting: Neurology

## 2018-07-08 ENCOUNTER — Other Ambulatory Visit: Payer: Self-pay

## 2018-07-08 DIAGNOSIS — G4733 Obstructive sleep apnea (adult) (pediatric): Secondary | ICD-10-CM

## 2018-07-08 DIAGNOSIS — Z9989 Dependence on other enabling machines and devices: Secondary | ICD-10-CM | POA: Diagnosis not present

## 2018-07-08 NOTE — Patient Instructions (Signed)
You are due for a new CPAP machine in July 2021. I like for your to undergo PSG or HST to establish your new baseline before that point , on your next yearly visit - this may be with NP or me,

## 2018-07-08 NOTE — Progress Notes (Signed)
PATIENT: [name] DOB: [birth year]  REASON FOR VISIT: follow up- obstructive sleep apnea on CPAP HISTORY FROM: patient  HISTORY OF PRESENT ILLNESS:  Virtual Visit via Video Note  I connected with [name] on 07/08/18 at  3:00 PM EDT by a video enabled telemedicine application and verified that I am speaking with the correct person using two identifiers.   I discussed the limitations of evaluation and management by telemedicine and the availability of in person appointments. The patient expressed understanding and agreed to proceed.     07-08-2018  We discussed the possible new issue of a new CPAP by 7 -2021, this time through [facility].  I will follow q 12 month with compliance and Epworth score. The patient had 100% compliance for the last 30 days with an average user time of 8 hours and 18 minutes and 5 seconds.  Each day the machine was used over 4 hours.  The 95th percentile pressure was 10.6 cmH2O the average residual AHI was 4.3/h of sleep, the mean pressure was 8.5 cm of water this current machine has an a flex setting of 2 cm and minimum pressure of 6 and a maximum pressure of 12 cmH2O.  The ramp time is 15 minutes long.  The humidifier is set at 2 and the heated tube is set at 3.  There needs to be no change in the patient's settings however she would much rather use [place] using the current machine a dream station auto CPAP from July 2016.  I will take care of that the next machine will be a ResMed machine again if possible.  It would likely be another auto titration device but I want to be able to tell if she has central or obstructive apneas and to find more out why her AHI is above 3/h.  She does not have significant air leaks by the way.   05-09-2017, CD [name] is a 73 year old caucasian, married female, here seen in the presence of her husband. She is a breast cancer survivor. She uses nasal pillows. 2  years ago she got a new machine after her first machine broke. At the time she switched from [facility] to nasal.    I received and reviewed the patient CPAP download from her DME company. It appears the patient has used her CPAP nightly for the last 30 days. Each night she uses her machine greater than 4 hours. On average she uses her machine 8 hours and 38 minutes. Her average pressure is 9.6 cm of water. Her residual AHI is 3.9. This is an excellent control of apnea per compliance download. Epworth 4, FSS 28. She reports her hands get numb, and  when in bed her feet will get numb- she had back surgery and had symptoms before ( left sided sciatica- PAIN) .   [name] is a 73 year old female with a history of obstructive sleep apnea on CPAP. She returns today for a compliance download. Unfortunately we are unable to obtain a wireless download. We will have her DME company fax [place] over a 30 day download. The patient states that she continues to use her CPAP nightly. Denies any trouble falling asleep. She continues to feel that the machine works well. She does note that sometimes if she sleeps on her side she will notice that the mask leaks slightly. She does change out her supplies regularly. She reports that she lost  her eldest son on February 5. This was unexpected. She denies any new neurological symptoms. Returns today for an evaluation.  HISTORY  11/02/15 [facility]): [name] is a 73 year old female with a history of obstructive sleep apnea on CPAP. She returns today for a 30 day compliance download.   Her download today on 11/02/2015 documents an 87% compliance with an average user time of 6 hours and 37 minutes on all days. The patient's residual AHI has increased to 4.4 which is still good but used to be a little bit lower area to she uses an auto CPAP with a 90's percent pressure of 9.6 cm water the window of pressures between 6 and 12 cm water. She has a very long ramp time of 30 minutes. She has  noticed that she has a little bit more trouble to initiate sleep on her new machine and I think this may be related to the very long ramp time. I will shorten the REM time to 12 minutes.  HISTORY 10/27/13:   [name] is 73 year old female with history of obstructive sleep Apnea on CPAP. She returns today for a 90 day compliance download. She brought her machine with her today and her reports shows an AHI of 2.2 on auto-titration at 6-12 cm of water. She uses her machine for an average of 6 hours and 22 minutes a night, with 97%compliance. His Epworth score is 4 points was previously 3 . His fatigue severity score is 8 was previously 16. Patient reports that she gets about 6.5 hours of sleep a night. She goes to bed around 11:30 pm and arises between 6:30-7:30am. She denies having trouble falling asleep or staying a sleep. States that she normally does not have to get up at night to urinate. Overall patient feels that CPAP has improved his sleepiness and fatigue. Since the last visit the Patient was diagnosed with breast cancer in June 2014. She is on an estrogen blocker now which has caused her to gain weight. She states she is now cancer free.   REVIEW OF SYSTEMS: Out of a complete 14 system review of symptoms, the patient complains only of the following symptoms, and all other reviewed systems are negative.  Back pain, cough, wheezing, runny nose, chills, eye itching, environmental allergies- not sleepy and not fatigued.     ALLERGIES: Allergies  Allergen Reactions   Peanut (Diagnostic) Cough, Hypertension and Shortness Of Breath   Adhesive [Tape] Rash   Latex Itching and Other (See Comments)    Causes blisters    HOME MEDICATIONS: Outpatient Medications Prior to Visit  Medication Sig Dispense Refill   aspirin 81 MG tablet Take 81 mg by mouth daily.     cholecalciferol (VITAMIN D) 1000 units tablet Take 1,000 Units by mouth daily.     Cyanocobalamin (VITAMIN B-12 CR PO) Take 500  mcg by mouth daily.      fluticasone (FLONASE) 50 MCG/ACT nasal spray Place 1 spray into both nostrils daily. (Patient taking differently: Place 1 spray into both nostrils daily as needed. ) 16 g 5   fluticasone (FLOVENT HFA) 44 MCG/ACT inhaler Inhale 2 puffs into the lungs 2 (two) times daily. With spacer. 1 Inhaler 5   lisinopril-hydrochlorothiazide (PRINZIDE,ZESTORETIC) 10-12.5 MG tablet Take 1 tablet by mouth daily. 90 tablet 1   loratadine (CLARITIN) 10 MG tablet Take 1 tablet (10 mg total) by mouth daily.     lovastatin (MEVACOR) 40 MG tablet Take 1 tablet (40 mg total) by mouth at  bedtime. (Patient not taking: Reported on 07/02/2018) 90 tablet 1   metFORMIN (GLUCOPHAGE) 500 MG tablet TAKE 1 TABLET BY MOUTH TWICE DAILY WITH A MEAL     metoprolol succinate (TOPROL-XL) 25 MG 24 hr tablet Take 1 tablet (25 mg total) by mouth daily. 30 tablet 0   tamoxifen (NOLVADEX) 10 MG tablet Take 1 tablet (10 mg total) by mouth daily. 90 tablet 3   vitamin C (ASCORBIC ACID) 500 MG tablet Take 500 mg by mouth daily.      Zinc 50 MG TABS      Facility-Administered Medications Prior to Visit  Medication Dose Route Frequency Provider Last Rate Last Dose   EPINEPHrine (Anaphylaxis) SOLN 0.3 mg  0.3 mg Intramuscular Once Bobbitt, Sedalia Muta, MD        PAST MEDICAL HISTORY: Past Medical History:  Diagnosis Date   Allergic rhinitis 03/23/2013   Allergy    Anemia    due to vaginal bleeding   Arthritis    hands   Asthmatic bronchitis 09/03/2017   Breast cancer ([place]) 08/2012   left   Dental crowns present    Eczema    Fibroids    Headache(784.0)    tension   History of endometriosis    Hyperlipidemia    Hypertension    under control with med., has been on med. x 3 yr.   OSA (obstructive sleep apnea)    Postmenopausal bleeding    Recurrent upper respiratory infection (URI)    S/P radiation therapy 10/20/2012-12/05/2012   left breast 50.4 gray, lumpectomy cavity boosted to  62.4 gray   Seasonal allergies    Sleep apnea    uses CPAP nightly   Urinary urgency    White coat hypertension     PAST SURGICAL HISTORY: Past Surgical History:  Procedure Laterality Date   BACK SURGERY  1997   lumbar   BREAST LUMPECTOMY WITH NEEDLE LOCALIZATION AND AXILLARY SENTINEL LYMPH NODE BX Left 09/03/2012   Procedure: BREAST LUMPECTOMY WITH NEEDLE LOCALIZATION AND AXILLARY SENTINEL LYMPH NODE BX;  Surgeon: Edward Jolly, MD;  Location: [facility];  Service: General;  Laterality: Left;   CHOLECYSTECTOMY  1995   COLONOSCOPY W/ POLYPECTOMY  03/20/2007   DILATION AND CURETTAGE OF UTERUS  2005   DILATION AND CURETTAGE OF UTERUS  06/22/2011   Procedure: DILATATION AND CURETTAGE;  Surgeon: Alvino Chapel, MD;  Location: [place];  Service: Gynecology;  Laterality: N/A;  OK PER KEELA FOR 7:15 START   DORSAL COMPARTMENT RELEASE Right 05/19/2013   Procedure: RELEASE 1ST  DORSAL COMPARTMENT RIGHT (DEQUERVAIN);  Surgeon: Cammie Sickle., MD;  Location: [facility];  Service: Orthopedics;  Laterality: Right;   EXPLORATORY LAPAROTOMY  06-30-2007   ATTEMPTED HYSTERECTOMY ABORTED DUE TO EXTENSIVE ENDOMETRIOSIS W/ DENSE PELVIC ADHESIONS INVOLVING UTERUS AND LOWER RECTOSIGMOID   HYSTEROSCOPY W/D&C  05/06/2009    FAMILY HISTORY: Family History  Problem Relation Age of Onset   Vision loss Mother    Hypertension Mother    Anesthesia problems Mother        post-op N/V   Heart disease Mother        carotid stenosis s/p CAE   Osteoporosis Mother    Eczema Mother    Depression Father    Heart disease Sister 89       arrhythmia   Hypertension Sister    Hyperlipidemia Sister    Diabetes Brother    Hypertension Brother    Cancer  Brother        skin   Hyperlipidemia Brother    Stroke Brother    COPD Daughter    Atopy Daughter    Cancer Cousin    Kidney failure Maternal Grandmother    Heart  disease Maternal Grandfather    Hypertension Other    Colon cancer Neg Hx    Allergic rhinitis Neg Hx    Asthma Neg Hx    Urticaria Neg Hx     SOCIAL HISTORY: Social History   Socioeconomic History   Marital status: Married    Spouse name: [name]   Number of children: 4   Years of education: [employer] education level: Not on file  Occupational History   Occupation: retired  Scientist, product/process development strain: Not on file   Food insecurity:    Worry: Not on file    Inability: Not on Lexicographer needs:    Medical: Not on file    Non-medical: Not on file  Tobacco Use   Smoking status: Never Smoker   Smokeless tobacco: Never Used  Substance and Sexual Activity   Alcohol use: No    Alcohol/week: 0.0 standard drinks   Drug use: No   Sexual activity: Yes    Comment: menarche 82, P4, Premarin for short while, Provera x 3 years  Lifestyle   Physical activity:    Days per week: Not on file    Minutes per session: Not on file   Stress: Not on file  Relationships   Social connections:    Talks on phone: Not on file    Gets together: Not on file    Attends religious service: Not on file    Active member of club or organization: Not on file    Attends meetings of clubs or organizations: Not on file    Relationship status: Not on file   Intimate partner violence:    Fear of current or ex partner: Not on file    Emotionally abused: Not on file    Physically abused: Not on file    Forced sexual activity: Not on file  Other Topics Concern   Not on file  Social History Narrative   Marital status: married x 40  years; happily married; no abuse.      Children: 4 children (2 sons living; 1 son died in 721 at age 40years old, 1 daughter); six grandchildren; no gg.      Lives:  With husband.      Employed: retired in 2011; [employer].      Tobacco: none       Alcohol:  None      Drugs: none      Exercise:   Rowing 3 days per week.      Seatbelt:  100%      Sunscreen:  Face SPF 15.      Guns:  Loaded mostly secured.      ADLs:  Drives minimally; no assistant devices; independent with all ADLs.       Advanced Directives: YES; FULL CODE but no prolonged measures.      Her nocturia is improved under CPAP use at 10 cm water- AHI is now 0.8 and from 30 at baseline. CMS compliance  6 hours 40 minutes - sleeps on the side, 08-10-11 .FFM - likes it.    . Flonase used prn.     Caffeine Use: 1 cup daily  Observations/Objective:   There were no vitals filed for this visit. There is no height or weight on file to calculate BMI.   Neck Circumference  Was last at 16 inches, Mallampati 2-3+,  last sleep study 09/2009 but she get a new dream-station philips machine without retesting in July 2016. Marland Kitchen    Generalized: Well developed, in no acute distress  Neurological examination  Mentation: Alert oriented to time, place, history taking. Follows all commands speech and language fluent Cranial nerve:  Taste and smell have recovered. Pupils are equally round . Extraocular movements were fully intact.  Uvula and  tongue move midline. Head turning and shoulder shrug  were normal and symmetric. Motor: Full ROM of all 4 extremities. Symmetric grip, no tremors, . Coordination:  finger-nose bilaterally intact.  Gait and station: Gait is wider based. Balance problems when getting up in AM.   DIAGNOSTIC DATA (LABS, IMAGING, TESTING) - I reviewed patient records, labs, notes, testing and imaging myself where available.  CPAP download:  She has a 73 year old machine, like a bedside radio. S 10 machine by [employer].   [facility] ,is DME   ROS: .no longer EDS, How likely are you to doze in the following situations: 0 = not likely, 1 = slight chance, 2 = moderate chance, 3 = high chance  Sitting and Reading? Watching Television? Sitting inactive in a public place (theater or  meeting)? Lying down in the afternoon when circumstances permit? Sitting and talking to someone? Sitting quietly after lunch without alcohol? In a car, while stopped for a few minutes in traffic? As a passenger in a car for an hour without a break?  Total = 3 /24    Lab Results  Component Value Date   CHOL 197 01/29/2018   HDL 44 01/29/2018   LDLCALC 121 (H) 01/29/2018   TRIG 159 (H) 01/29/2018   CHOLHDL 4.5 (H) 01/29/2018   Lab Results  Component Value Date   HGBA1C 7.2 (A) 01/29/2018    Lab Results  Component Value Date   TSH 1.570 12/11/2016     I discussed the assessment and treatment plan with the patient. The patient was provided an opportunity to ask questions and all were answered. The patient agreed with the plan and demonstrated an understanding of the instructions.   The patient was advised to call back or seek an in-person evaluation if the symptoms worsen or if the condition fails to improve as anticipated.  I provided 18 minutes of non-face-to-face time during this encounter.   Larey Seat, MD  1. Obstructive sleep apnea on CPAP.Obesity , BMI 41.  2. Left Breast cancer survivor, status post radiation.   Please download new CPAP compliance data from [facility] in [place] before next years visit. Marland Kitchen  Next year new sleep study-  establish new baseline AHI , new machine in July 2021.    Larey Seat, MD  07/08/2018, 3:07 PM [facility] [address], [place], [place] [address] [phone]

## 2018-07-16 ENCOUNTER — Ambulatory Visit: Payer: Medicare Other | Admitting: Adult Health

## 2018-08-15 ENCOUNTER — Other Ambulatory Visit: Payer: Self-pay | Admitting: Family Medicine

## 2018-08-15 NOTE — Telephone Encounter (Signed)
Requested medication (s) are due for refill today: Yes  Requested medication (s) are on the active medication list: Yes  Last refill:  1 month ago by historical provider  Future visit scheduled: No  Notes to clinic:  Unable to refill, last refilled by another provider     Requested Prescriptions  Pending Prescriptions Disp Refills   metFORMIN (GLUCOPHAGE) 500 MG tablet [Pharmacy Med Name: metFORMIN HCl 500 MG Oral Tablet] 180 tablet 0    Sig: TAKE 1 TABLET BY MOUTH TWICE DAILY WITH A MEAL     Endocrinology:  Diabetes - Biguanides Failed - 08/15/2018  3:43 PM      Failed - HBA1C is between 0 and 7.9 and within 180 days    Hemoglobin A1C  Date Value Ref Range Status  01/29/2018 7.2 (A) 4.0 - 5.6 % Final   Hgb A1c MFr Bld  Date Value Ref Range Status  12/07/2016 6.3 (H) 4.8 - 5.6 % Final    Comment:             Prediabetes: 5.7 - 6.4          Diabetes: >6.4          Glycemic control for adults with diabetes: <7.0          Failed - Valid encounter within last 6 months    Recent Outpatient Visits          6 months ago Type 2 diabetes mellitus without complication, without long-term current use of insulin ([place])   [facility], Ranell Patrick, MD   9 months ago Type 2 diabetes mellitus without complication, without long-term current use of insulin [facility])   [facility], Ranell Patrick, MD   11 months ago Contusion of lesser toe of left foot without damage to nail, initial encounter   [facility], Ranell Patrick, MD   1 year ago Type 2 diabetes mellitus without complication, without long-term current use of insulin [facility])   [facility], Renette Butters, MD   1 year ago Cough   [facility], Lilia Argue, MD      Future Appointments            In 6 months Vyas, Sylvan Cheese, MD [facility]           Passed - Cr in normal range and within 360 days    Creatinine  Date Value Ref Range Status   06/24/2014 0.8 0.6 - 1.1 mg/dL Final   Creat  Date Value Ref Range Status  12/22/2015 0.77 0.60 - 0.93 mg/dL Final    Comment:      For patients > or = 74 years of age: The upper reference limit for Creatinine is approximately 13% higher for people identified as African-American.      Creatinine, Ser  Date Value Ref Range Status  01/29/2018 0.72 0.57 - 1.00 mg/dL Final         Passed - eGFR in normal range and within 360 days    GFR, Est African American  Date Value Ref Range Status  05/09/2015 >89 >=60 mL/min Final   GFR calc Af Amer  Date Value Ref Range Status  01/29/2018 96 >59 mL/min/1.73 Final   GFR, Est Non African American  Date Value Ref Range Status  05/09/2015 88 >=60 mL/min Final    Comment:      The estimated GFR is a calculation valid for adults (>=  16 years old) that uses the CKD-EPI algorithm to adjust for age and sex. It is   not to be used for children, pregnant women, hospitalized patients,    patients on dialysis, or with rapidly changing kidney function. According to the NKDEP, eGFR >89 is normal, 60-89 shows mild impairment, 30-59 shows moderate impairment, 15-29 shows severe impairment and <15 is ESRD.      GFR calc non Af Amer  Date Value Ref Range Status  01/29/2018 83 >59 mL/min/1.73 Final

## 2018-08-18 NOTE — Telephone Encounter (Signed)
Left a msg for patient to return call. Last office visit stated metformin give patient diarrhea and we need to know if she is still taking the metformin.

## 2018-08-19 ENCOUNTER — Ambulatory Visit: Payer: Self-pay | Admitting: Cardiology

## 2018-08-20 NOTE — Telephone Encounter (Signed)
I have attempted to call pt. No answer. Last office visit stated metformin give patient diarrhea and we need to know if she is still taking the metformin.

## 2018-08-25 ENCOUNTER — Telehealth: Payer: Self-pay | Admitting: Emergency Medicine

## 2018-08-25 NOTE — Telephone Encounter (Signed)
She had trouble tolerating metformin when last seen for diabetes in Nov 2019. Has she been able to tolerate that better recently?  Can prescribe low dose metformin for now if tolerated, but due for OV - I do not see one scheduled.

## 2018-08-25 NOTE — Telephone Encounter (Signed)
Patient want to stay on the metformin. Need a refill on the metformin. The other medication cost too much. She need a refill on metformin until we come up with another medication that will help.

## 2018-08-26 MED ORDER — METFORMIN HCL 500 MG PO TABS: 500.0000 mg | ORAL_TABLET | Freq: Every day | ORAL | 0 refills | Status: DC

## 2018-08-26 NOTE — Telephone Encounter (Signed)
Patient states she only been taking 500 mg once a day. She said she could not tolerated the 500 mg twice a day. She is will to try 250 mg twice a day but seems to think the 1000 mg is what she can not tolerate. Or we can just keep her on the 500 mg once a  Day as she has been taking.

## 2018-08-26 NOTE — Telephone Encounter (Signed)
500mg  once per day is ok for now. Can check A1c at that dose at scheduled follow up. Thanks.

## 2018-09-09 ENCOUNTER — Other Ambulatory Visit: Payer: Self-pay

## 2018-09-09 ENCOUNTER — Encounter: Payer: Self-pay | Admitting: Family Medicine

## 2018-09-09 ENCOUNTER — Ambulatory Visit (INDEPENDENT_AMBULATORY_CARE_PROVIDER_SITE_OTHER): Payer: Medicare Other | Admitting: Family Medicine

## 2018-09-09 VITALS — BP 127/73 | HR 81 | Temp 98.4°F | Resp 14 | Wt 217.2 lb

## 2018-09-09 DIAGNOSIS — E119 Type 2 diabetes mellitus without complications: Secondary | ICD-10-CM

## 2018-09-09 DIAGNOSIS — E785 Hyperlipidemia, unspecified: Secondary | ICD-10-CM

## 2018-09-09 DIAGNOSIS — I10 Essential (primary) hypertension: Secondary | ICD-10-CM

## 2018-09-09 MED ORDER — LOVASTATIN 40 MG PO TABS: 40.0000 mg | ORAL_TABLET | Freq: Every day | ORAL | 1 refills | Status: DC

## 2018-09-09 NOTE — Patient Instructions (Addendum)
Depending on A1c, we could consider second dose of metformin in evening as sugar free mints may have been an issue prior.   Try taking lovastatin earlier in the day if that helps to remember.   Follow up in 6 months unless we make changes in meds, then 3 months.   Let me know if there are questions and take care.   If you have lab work done today you will be contacted with your lab results within the next 2 weeks.  If you have not heard from [place] then please contact us. The fastest way to get your results is to register for My Chart.   IF you received an x-ray today, you will receive an invoice from [facility]. Please contact [facility] at [phone] with questions or concerns regarding your invoice.   IF you received labwork today, you will receive an invoice from [facility]. Please contact [facility] at [phone] with questions or concerns regarding your invoice.   Our billing staff will not be able to assist you with questions regarding bills from these companies.  You will be contacted with the lab results as soon as they are available. The fastest way to get your results is to activate your My Chart account. Instructions are located on the last page of this paperwork. If you have not heard from [place] regarding the results in 2 weeks, please contact this office.

## 2018-09-09 NOTE — Progress Notes (Signed)
Subjective:    Patient ID: [name], female    DOB: 04-14-1945, 73 y.o.   MRN: [id number]  HPI [name] is a 73 y.o. female Presents today for: Chief Complaint  Patient presents with  . Diabetes    patient was last seen here 01/29/18 for diabetes. Patient is doing well with no issus. Blood sugar this am was 116   Diabetes: See prior visits.  Has some difficulty with diarrhea when taking metformin 1000 mg total per day.  Tried decreasing metformin to twice daily dosing with increased exercise.  Still had diarrhea with metformin in November 2019 even with taking it once in the morning.  Other options discussed but cost prohibitive.  Currently has been taking metformin 500mg  in the morning - no GI upset/diarrhea.  Had been eating sugar free mints prior - not using now. Less exercise recently with Covid19 and MSK issues. Some walking.  Less diet adherence with Covid pandemic.  Microalbumin:nl in August 2019.  Optho, foot exam, pneumovax:up to date.  On mevacor for statin - forgets to take - rarely uses. No known side effects.  On lisinopril for ACE-I.  Home reading: fasting 116 today.   Lab Results  Component Value Date   HGBA1C 7.2 (A) 01/29/2018   HGBA1C 7.0 (A) 10/29/2017   HGBA1C 7.2 07/29/2017   Lab Results  Component Value Date   MICROALBUR 0.2 12/06/2015   LDLCALC 121 (H) 01/29/2018   CREATININE 0.72 01/29/2018   Hypertension: BP Readings from Last 3 Encounters:  09/09/18 127/73  05/12/18 138/78  01/29/18 135/78   Lab Results  Component Value Date   CREATININE 0.72 01/29/2018  BP 112/50 at home. No new side effects with toprol and lisinopril/hct.    Patient Active Problem List   Diagnosis Date Noted  . History of food allergy 10/22/2017  . Anaphylaxis 10/22/2017  . Food allergy 09/03/2017  . Mild persistent asthma 09/03/2017  . Spinal stenosis of lumbar region with neurogenic claudication 08/01/2016  . Pure hypercholesterolemia 08/01/2016   . Osteopenia 10/18/2014  . Obesity (BMI 30-39.9) 06/22/2014  . Perennial allergic rhinitis 03/23/2013  . Cancer of central portion of left female breast ([place]) 08/25/2012  . Obstructive sleep apnea on CPAP 04/21/2012  . Diabetes ([place]) 12/28/2011  . HTN (hypertension) 12/28/2011  . Uterine bleeding 04/30/2011   Past Medical History:  Diagnosis Date  . Allergic rhinitis 03/23/2013  . Allergy   . Anemia    due to vaginal bleeding  . Arthritis    hands  . Asthmatic bronchitis 09/03/2017  . Breast cancer ([place]) 08/2012   left  . Dental crowns present   . Eczema   . Fibroids   . Headache(784.0)    tension  . History of endometriosis   . Hyperlipidemia   . Hypertension    under control with med., has been on med. x 3 yr.  . OSA (obstructive sleep apnea)   . Postmenopausal bleeding   . Recurrent upper respiratory infection (URI)   . S/P radiation therapy 10/20/2012-12/05/2012   left breast 50.4 gray, lumpectomy cavity boosted to 62.4 gray  . Seasonal allergies   . Sleep apnea    uses CPAP nightly  . Urinary urgency   . White coat hypertension    Past Surgical History:  Procedure Laterality Date  . BACK SURGERY  1997   lumbar  . BREAST LUMPECTOMY WITH NEEDLE LOCALIZATION AND AXILLARY SENTINEL LYMPH NODE BX Left 09/03/2012   Procedure: BREAST LUMPECTOMY WITH NEEDLE  LOCALIZATION AND AXILLARY SENTINEL LYMPH NODE BX;  Surgeon: Edward Jolly, MD;  Location: [facility];  Service: General;  Laterality: Left;  . CHOLECYSTECTOMY  1995  . COLONOSCOPY W/ POLYPECTOMY  03/20/2007  . DILATION AND CURETTAGE OF UTERUS  2005  . DILATION AND CURETTAGE OF UTERUS  06/22/2011   Procedure: DILATATION AND CURETTAGE;  Surgeon: Alvino Chapel, MD;  Location[facility];  Service: Gynecology;  Laterality: N/A;  OK PER KEELA FOR 7:15 START  . DORSAL COMPARTMENT RELEASE Right 05/19/2013   Procedure: RELEASE 1ST  DORSAL COMPARTMENT RIGHT (DEQUERVAIN);  Surgeon: Cammie Sickle., MD;  Location: [facility];  Service: Orthopedics;  Laterality: Right;  . EXPLORATORY LAPAROTOMY  06-30-2007   ATTEMPTED HYSTERECTOMY ABORTED DUE TO EXTENSIVE ENDOMETRIOSIS W/ DENSE PELVIC ADHESIONS INVOLVING UTERUS AND LOWER RECTOSIGMOID  . HYSTEROSCOPY W/D&C  05/06/2009   Allergies  Allergen Reactions  . Peanut (Diagnostic) Cough, Hypertension and Shortness Of Breath  . Adhesive [Tape] Rash  . Latex Itching and Other (See Comments)    Causes blisters   Prior to Admission medications   Medication Sig Start Date End Date Taking? Authorizing Provider  aspirin 81 MG tablet Take 81 mg by mouth daily.   Yes [provider]  Cyanocobalamin (VITAMIN B-12 CR PO) Take 500 mcg by mouth daily.    Yes [provider]  fluticasone (FLONASE) 50 MCG/ACT nasal spray Place 1 spray into both nostrils daily. Patient taking differently: Place 1 spray into both nostrils daily as needed.  09/03/17  Yes Bobbitt, [place] Muta, MD  fluticasone (FLOVENT HFA) 44 MCG/ACT inhaler Inhale 2 puffs into the lungs 2 (two) times daily. With spacer. 10/22/17  Yes Bobbitt, [place] Muta, MD  lisinopril-hydrochlorothiazide (PRINZIDE,ZESTORETIC) 10-12.5 MG tablet Take 1 tablet by mouth daily. 10/06/17  Yes [name], MD  loratadine (CLARITIN) 10 MG tablet Take 1 tablet (10 mg total) by mouth daily. 05/12/18  Yes Nicholas Lose, MD  lovastatin (MEVACOR) 40 MG tablet Take 1 tablet (40 mg total) by mouth at bedtime. 07/29/17  Yes [name], MD  metFORMIN (GLUCOPHAGE) 500 MG tablet Take 1 tablet (500 mg total) by mouth daily with breakfast. 08/26/18  Yes Wendie Agreste, MD  metoprolol succinate (TOPROL-XL) 25 MG 24 hr tablet Take 1 tablet (25 mg total) by mouth daily. 05/12/18  Yes Nicholas Lose, MD  tamoxifen (NOLVADEX) 10 MG tablet Take 1 tablet (10 mg total) by mouth daily. 05/12/18  Yes Nicholas Lose, MD  vitamin C (ASCORBIC ACID) 500 MG tablet Take 500 mg by mouth daily.    Yes  [provider]  cholecalciferol (VITAMIN D) 1000 units tablet Take 1,000 Units by mouth daily.    [provider]  Zinc 50 MG TABS  12/17/17   [provider]  LISINOPRIL PO Take 1 tablet by mouth daily.  06/13/11  [provider]   Social History   Socioeconomic History  . Marital status: Married    Spouse name: [name]  . Number of children: 4  . Years of education: College  . Highest education level: Not on file  Occupational History  . Occupation: retired  Scientific laboratory technician  . Financial resource strain: Not on file  . Food insecurity    Worry: Not on file    Inability: Not on file  . Transportation needs    Medical: Not on file    Non-medical: Not on file  Tobacco Use  . Smoking status:  Never Smoker  . Smokeless tobacco: Never Used  Substance and Sexual Activity  . Alcohol use: No    Alcohol/week: 0.0 standard drinks  . Drug use: No  . Sexual activity: Yes    Comment: menarche 72, P4, Premarin for short while, Provera x 3 years  Lifestyle  . Physical activity    Days per week: Not on file    Minutes per session: Not on file  . Stress: Not on file  Relationships  . Social Herbalist on phone: Not on file    Gets together: Not on file    Attends religious service: Not on file    Active member of club or organization: Not on file    Attends meetings of clubs or organizations: Not on file    Relationship status: Not on file  . Intimate partner violence    Fear of current or ex partner: Not on file    Emotionally abused: Not on file    Physically abused: Not on file    Forced sexual activity: Not on file  Other Topics Concern  . Not on file  Social History Narrative   Marital status: married x 16  years; happily married; no abuse.      Children: 4 children (2 sons living; 1 son died in 3131 at age 31years old, 1 daughter); six grandchildren; no gg.      Lives:  With husband.      Employed: retired in 2011; [employer].      Tobacco: none       Alcohol:  None      Drugs: none      Exercise:  Rowing 3 days per week.      Seatbelt:  100%      Sunscreen:  Face SPF 15.      Guns:  Loaded mostly secured.      ADLs:  Drives minimally; no assistant devices; independent with all ADLs.       Advanced Directives: YES; FULL CODE but no prolonged measures.      Her nocturia is improved under CPAP use at 10 cm water- AHI is now 0.8 and from 30 at baseline. CMS compliance  6 hours 40 minutes - sleeps on the side, 08-10-11 .FFM - likes it.    . Flonase used prn.     Caffeine Use: 1 cup daily    Review of Systems  Constitutional: Negative for fatigue and unexpected weight change.  Respiratory: Negative for chest tightness and shortness of breath.   Cardiovascular: Negative for chest pain, palpitations and leg swelling.  Gastrointestinal: Negative for abdominal pain and blood in stool.  Neurological: Negative for dizziness, syncope, light-headedness and headaches.       Objective:   Physical Exam Vitals signs reviewed.  Constitutional:      Appearance: She is well-developed.  HENT:     Head: Normocephalic and atraumatic.  Eyes:     Conjunctiva/sclera: Conjunctivae normal.     Pupils: Pupils are equal, round, and reactive to light.  Neck:     Vascular: No carotid bruit.  Cardiovascular:     Rate and Rhythm: Normal rate and regular rhythm.     Heart sounds: Normal heart sounds.  Pulmonary:     Effort: Pulmonary effort is normal.     Breath sounds: Normal breath sounds.  Abdominal:     Palpations: Abdomen is soft. There is no pulsatile mass.     Tenderness: There is  no abdominal tenderness.  Musculoskeletal:     Right lower leg: Edema (trace pedal edema. ) present.     Left lower leg: Edema present.  Skin:    General: Skin is warm and dry.  Neurological:     Mental Status: She is alert and oriented to person, place, and time.  Psychiatric:        Behavior: Behavior normal.     Vitals:   09/09/18 1128  BP: 127/73  Pulse: 81  Resp: 14  Temp: 98.4 F (36.9 C)  TempSrc: Oral  SpO2: 97%  Weight: 217 lb 3.2 oz (98.5 kg)       Assessment & Plan:   [name] is a 73 y.o. female Type 2 diabetes mellitus without complication, without long-term current use of insulin ([place]) - Plan: Hemoglobin A1c  -Borderline control previously.  Is tolerating metformin 500 mg every morning.  Potentially could have had gastrointestinal issues/diarrhea with sugar-free products use.  Has now avoided those.  -Depending on A1c, option of evening dose of 500 mg in addition to a.m. dose.  No other changes at this time.  Hyperlipidemia, unspecified hyperlipidemia type - Plan: Lipid Panel, Comprehensive metabolic panel, lovastatin (MEVACOR) 40 MG tablet  -Afternoon dosing discussed if that helps remember.  Check lipids.   Essential hypertension  -Stable, continue same regimen.  No refills required at this time but okay to refill for 6 months.   Meds ordered this encounter  Medications  . lovastatin (MEVACOR) 40 MG tablet    Sig: Take 1 tablet (40 mg total) by mouth daily.    Dispense:  90 tablet    Refill:  1   Patient Instructions   Depending on A1c, we could consider second dose of metformin in evening as sugar free mints may have been an issue prior.   Try taking lovastatin earlier in the day if that helps to remember.   Follow up in 6 months unless we make changes in meds, then 3 months.   Let me know if there are questions and take care.   If you have lab work done today you will be contacted with your lab results within the next 2 weeks.  If you have not heard from [place] then please contact us. The fastest way to get your results is to register for My Chart.   IF you received an x-ray today, you will receive an invoice from [facility]. Please contact [facility] at [phone] with questions or concerns regarding your invoice.   IF you received  labwork today, you will receive an invoice from [facility]. Please contact [facility] at [phone] with questions or concerns regarding your invoice.   Our billing staff will not be able to assist you with questions regarding bills from these companies.  You will be contacted with the lab results as soon as they are available. The fastest way to get your results is to activate your My Chart account. Instructions are located on the last page of this paperwork. If you have not heard from [place] regarding the results in 2 weeks, please contact this office.       Signed,   [name], MD [facility] [place].  09/09/18 12:25 PM

## 2018-09-10 LAB — COMPREHENSIVE METABOLIC PANEL
ALT: 17 IU/L (ref 0–32)
AST: 29 IU/L (ref 0–40)
Albumin/Globulin Ratio: 1.6 (ref 1.2–2.2)
Albumin: 4.3 g/dL (ref 3.7–4.7)
Alkaline Phosphatase: 69 IU/L (ref 39–117)
BUN/Creatinine Ratio: 23 (ref 12–28)
BUN: 16 mg/dL (ref 8–27)
Bilirubin Total: 0.5 mg/dL (ref 0.0–1.2)
CO2: 24 mmol/L (ref 20–29)
Calcium: 10 mg/dL (ref 8.7–10.3)
Chloride: 100 mmol/L (ref 96–106)
Creatinine, Ser: 0.71 mg/dL (ref 0.57–1.00)
GFR calc Af Amer: 98 mL/min/{1.73_m2} (ref >59)
GFR calc non Af Amer: 85 mL/min/{1.73_m2} (ref >59)
Globulin, Total: 2.7 g/dL (ref 1.5–4.5)
Glucose: 120 mg/dL — ABNORMAL HIGH (ref 65–99)
Potassium: 4 mmol/L (ref 3.5–5.2)
Sodium: 140 mmol/L (ref 134–144)
Total Protein: 7 g/dL (ref 6.0–8.5)

## 2018-09-10 LAB — LIPID PANEL
Chol/HDL Ratio: 4.7 ratio — ABNORMAL HIGH (ref 0.0–4.4)
Cholesterol, Total: 229 mg/dL — ABNORMAL HIGH (ref 100–199)
HDL: 49 mg/dL (ref >39)
LDL Calculated: 150 mg/dL — ABNORMAL HIGH (ref 0–99)
Triglycerides: 151 mg/dL — ABNORMAL HIGH (ref 0–149)
VLDL Cholesterol Cal: 30 mg/dL (ref 5–40)

## 2018-09-10 LAB — HEMOGLOBIN A1C
Est. average glucose Bld gHb Est-mCnc: 151 mg/dL
Hgb A1c MFr Bld: 6.9 % — ABNORMAL HIGH (ref 4.8–5.6)

## 2018-10-08 DIAGNOSIS — H2513 Age-related nuclear cataract, bilateral: Secondary | ICD-10-CM | POA: Diagnosis not present

## 2018-10-08 DIAGNOSIS — E119 Type 2 diabetes mellitus without complications: Secondary | ICD-10-CM | POA: Diagnosis not present

## 2018-10-08 DIAGNOSIS — H0102A Squamous blepharitis right eye, upper and lower eyelids: Secondary | ICD-10-CM | POA: Diagnosis not present

## 2018-10-08 DIAGNOSIS — H0102B Squamous blepharitis left eye, upper and lower eyelids: Secondary | ICD-10-CM | POA: Diagnosis not present

## 2018-10-08 DIAGNOSIS — H353221 Exudative age-related macular degeneration, left eye, with active choroidal neovascularization: Secondary | ICD-10-CM | POA: Diagnosis not present

## 2018-10-08 DIAGNOSIS — H40013 Open angle with borderline findings, low risk, bilateral: Secondary | ICD-10-CM | POA: Diagnosis not present

## 2018-10-08 LAB — HM DIABETES EYE EXAM

## 2018-10-15 ENCOUNTER — Encounter: Payer: Self-pay | Admitting: Family Medicine

## 2018-10-22 DIAGNOSIS — H35722 Serous detachment of retinal pigment epithelium, left eye: Secondary | ICD-10-CM | POA: Diagnosis not present

## 2018-10-22 DIAGNOSIS — H2511 Age-related nuclear cataract, right eye: Secondary | ICD-10-CM | POA: Diagnosis not present

## 2018-10-22 DIAGNOSIS — H353132 Nonexudative age-related macular degeneration, bilateral, intermediate dry stage: Secondary | ICD-10-CM | POA: Diagnosis not present

## 2018-10-22 DIAGNOSIS — H353221 Exudative age-related macular degeneration, left eye, with active choroidal neovascularization: Secondary | ICD-10-CM | POA: Diagnosis not present

## 2018-11-03 ENCOUNTER — Encounter: Payer: Self-pay | Admitting: Family Medicine

## 2018-11-04 ENCOUNTER — Encounter: Payer: Self-pay | Admitting: Family Medicine

## 2018-11-04 ENCOUNTER — Other Ambulatory Visit: Payer: Self-pay | Admitting: Family Medicine

## 2018-11-08 MED ORDER — PRAVASTATIN SODIUM 20 MG PO TABS: 20.0000 mg | ORAL_TABLET | ORAL | 2 refills | Status: DC

## 2018-11-10 ENCOUNTER — Telehealth: Payer: Self-pay | Admitting: *Deleted

## 2018-11-10 ENCOUNTER — Other Ambulatory Visit: Payer: Self-pay | Admitting: *Deleted

## 2018-11-10 DIAGNOSIS — C50112 Malignant neoplasm of central portion of left female breast: Secondary | ICD-10-CM

## 2018-11-10 MED ORDER — TAMOXIFEN CITRATE 10 MG PO TABS: 10.0000 mg | ORAL_TABLET | Freq: Every day | ORAL | 0 refills | Status: DC

## 2018-11-10 NOTE — Telephone Encounter (Signed)
Received message from pt stating she was in [place] for an 8 day vacation.  Pt states she forgot her Tamoxifen at home and is requesting a 30 day refill to be called in to the [facility] in [place].  Script sent and pt very appreciative.

## 2018-11-21 DIAGNOSIS — H0102B Squamous blepharitis left eye, upper and lower eyelids: Secondary | ICD-10-CM | POA: Diagnosis not present

## 2018-11-21 DIAGNOSIS — H0102A Squamous blepharitis right eye, upper and lower eyelids: Secondary | ICD-10-CM | POA: Diagnosis not present

## 2018-11-21 DIAGNOSIS — H40013 Open angle with borderline findings, low risk, bilateral: Secondary | ICD-10-CM | POA: Diagnosis not present

## 2018-11-21 DIAGNOSIS — E119 Type 2 diabetes mellitus without complications: Secondary | ICD-10-CM | POA: Diagnosis not present

## 2018-11-21 DIAGNOSIS — H2513 Age-related nuclear cataract, bilateral: Secondary | ICD-10-CM | POA: Diagnosis not present

## 2018-11-21 DIAGNOSIS — H353221 Exudative age-related macular degeneration, left eye, with active choroidal neovascularization: Secondary | ICD-10-CM | POA: Diagnosis not present

## 2018-11-26 DIAGNOSIS — H35722 Serous detachment of retinal pigment epithelium, left eye: Secondary | ICD-10-CM | POA: Diagnosis not present

## 2018-11-26 DIAGNOSIS — H353221 Exudative age-related macular degeneration, left eye, with active choroidal neovascularization: Secondary | ICD-10-CM | POA: Diagnosis not present

## 2018-12-04 DIAGNOSIS — N958 Other specified menopausal and perimenopausal disorders: Secondary | ICD-10-CM | POA: Diagnosis not present

## 2018-12-04 DIAGNOSIS — M8588 Other specified disorders of bone density and structure, other site: Secondary | ICD-10-CM | POA: Diagnosis not present

## 2018-12-04 DIAGNOSIS — Z124 Encounter for screening for malignant neoplasm of cervix: Secondary | ICD-10-CM | POA: Diagnosis not present

## 2018-12-04 DIAGNOSIS — Z6841 Body Mass Index (BMI) 40.0 and over, adult: Secondary | ICD-10-CM | POA: Diagnosis not present

## 2018-12-12 DIAGNOSIS — N393 Stress incontinence (female) (male): Secondary | ICD-10-CM | POA: Diagnosis not present

## 2018-12-12 DIAGNOSIS — M6281 Muscle weakness (generalized): Secondary | ICD-10-CM | POA: Diagnosis not present

## 2018-12-12 DIAGNOSIS — R350 Frequency of micturition: Secondary | ICD-10-CM | POA: Diagnosis not present

## 2018-12-12 DIAGNOSIS — N3941 Urge incontinence: Secondary | ICD-10-CM | POA: Diagnosis not present

## 2018-12-18 ENCOUNTER — Other Ambulatory Visit: Payer: Self-pay | Admitting: Family Medicine

## 2018-12-18 DIAGNOSIS — I10 Essential (primary) hypertension: Secondary | ICD-10-CM

## 2018-12-18 NOTE — Telephone Encounter (Signed)
Medication: lisinopril-hydrochlorothiazide (PRINZIDE,ZESTORETIC) 10-12.5 MG tablet      Patient is requesting a refill of this medication.    Pharmacy:  [facility], [place] - [address] [place] [phone] (Phone) [phone] (Fax)

## 2018-12-19 DIAGNOSIS — N393 Stress incontinence (female) (male): Secondary | ICD-10-CM | POA: Diagnosis not present

## 2018-12-19 DIAGNOSIS — M6281 Muscle weakness (generalized): Secondary | ICD-10-CM | POA: Diagnosis not present

## 2018-12-19 DIAGNOSIS — N3941 Urge incontinence: Secondary | ICD-10-CM | POA: Diagnosis not present

## 2018-12-19 DIAGNOSIS — R350 Frequency of micturition: Secondary | ICD-10-CM | POA: Diagnosis not present

## 2018-12-19 MED ORDER — LISINOPRIL-HYDROCHLOROTHIAZIDE 10-12.5 MG PO TABS: 1.0000 | ORAL_TABLET | Freq: Every day | ORAL | 1 refills | Status: DC

## 2018-12-19 NOTE — Telephone Encounter (Signed)
Sent in lisinopril-hctz 10-12.5 mg #90 with 1 refill.  Per greene from 09/09/18 visit ok to refill for 6 months. Dgaddy, CMA

## 2018-12-23 ENCOUNTER — Telehealth: Payer: Self-pay | Admitting: Family Medicine

## 2018-12-23 DIAGNOSIS — Z23 Encounter for immunization: Secondary | ICD-10-CM | POA: Diagnosis not present

## 2018-12-23 NOTE — Telephone Encounter (Signed)
Copied from [place] [id number]. Topic: General - Other >> Dec 23, 2018 11:05 AM Celene Kras A wrote: Reason for CRM: Pt called and is requesting to know when her and her husband last had their pneumonia vaccines. Please advise.

## 2018-12-25 ENCOUNTER — Encounter: Payer: Self-pay | Admitting: Family Medicine

## 2018-12-25 DIAGNOSIS — M6281 Muscle weakness (generalized): Secondary | ICD-10-CM | POA: Diagnosis not present

## 2018-12-25 DIAGNOSIS — N3941 Urge incontinence: Secondary | ICD-10-CM | POA: Diagnosis not present

## 2018-12-25 DIAGNOSIS — N393 Stress incontinence (female) (male): Secondary | ICD-10-CM | POA: Diagnosis not present

## 2018-12-25 DIAGNOSIS — R350 Frequency of micturition: Secondary | ICD-10-CM | POA: Diagnosis not present

## 2018-12-29 DIAGNOSIS — Z853 Personal history of malignant neoplasm of breast: Secondary | ICD-10-CM | POA: Diagnosis not present

## 2018-12-29 DIAGNOSIS — Z1231 Encounter for screening mammogram for malignant neoplasm of breast: Secondary | ICD-10-CM | POA: Diagnosis not present

## 2018-12-29 LAB — HM MAMMOGRAPHY

## 2019-01-02 ENCOUNTER — Encounter: Payer: Self-pay | Admitting: Family Medicine

## 2019-01-02 NOTE — Progress Notes (Signed)
No evidence of malignancy.  

## 2019-01-05 DIAGNOSIS — R350 Frequency of micturition: Secondary | ICD-10-CM | POA: Diagnosis not present

## 2019-01-05 DIAGNOSIS — N3941 Urge incontinence: Secondary | ICD-10-CM | POA: Diagnosis not present

## 2019-01-05 DIAGNOSIS — M6281 Muscle weakness (generalized): Secondary | ICD-10-CM | POA: Diagnosis not present

## 2019-01-05 DIAGNOSIS — N393 Stress incontinence (female) (male): Secondary | ICD-10-CM | POA: Diagnosis not present

## 2019-01-06 DIAGNOSIS — C44529 Squamous cell carcinoma of skin of other part of trunk: Secondary | ICD-10-CM | POA: Diagnosis not present

## 2019-01-06 DIAGNOSIS — L570 Actinic keratosis: Secondary | ICD-10-CM | POA: Diagnosis not present

## 2019-01-06 DIAGNOSIS — C44722 Squamous cell carcinoma of skin of right lower limb, including hip: Secondary | ICD-10-CM | POA: Diagnosis not present

## 2019-01-06 DIAGNOSIS — L820 Inflamed seborrheic keratosis: Secondary | ICD-10-CM | POA: Diagnosis not present

## 2019-01-06 DIAGNOSIS — L821 Other seborrheic keratosis: Secondary | ICD-10-CM | POA: Diagnosis not present

## 2019-01-06 DIAGNOSIS — L859 Epidermal thickening, unspecified: Secondary | ICD-10-CM | POA: Diagnosis not present

## 2019-01-06 DIAGNOSIS — Z85828 Personal history of other malignant neoplasm of skin: Secondary | ICD-10-CM | POA: Diagnosis not present

## 2019-01-06 DIAGNOSIS — D1801 Hemangioma of skin and subcutaneous tissue: Secondary | ICD-10-CM | POA: Diagnosis not present

## 2019-01-06 DIAGNOSIS — D485 Neoplasm of uncertain behavior of skin: Secondary | ICD-10-CM | POA: Diagnosis not present

## 2019-01-07 DIAGNOSIS — H353221 Exudative age-related macular degeneration, left eye, with active choroidal neovascularization: Secondary | ICD-10-CM | POA: Diagnosis not present

## 2019-01-07 DIAGNOSIS — H35722 Serous detachment of retinal pigment epithelium, left eye: Secondary | ICD-10-CM | POA: Diagnosis not present

## 2019-01-09 ENCOUNTER — Telehealth: Payer: Self-pay | Admitting: Family Medicine

## 2019-01-09 ENCOUNTER — Other Ambulatory Visit: Payer: Self-pay

## 2019-01-09 MED ORDER — PRAVASTATIN SODIUM 20 MG PO TABS: 20.0000 mg | ORAL_TABLET | ORAL | 0 refills | Status: DC

## 2019-01-09 NOTE — Telephone Encounter (Signed)
Copied from [place] [phone]. Topic: Quick Communication - Rx Refill/Question >> Jan 09, 2019 11:53 AM Carolyn Stare wrote: Medication pravastatin (PRAVACHOL) 20 MG tablet     pt req 90 day supply since med is working good for her   Preferred Pharmacy [facility]: Please be advised that RX refills may take up to 3 business days. We ask that you follow-up with your pharmacy.

## 2019-01-09 NOTE — Telephone Encounter (Signed)
Rx sent to pharmacy   

## 2019-01-12 DIAGNOSIS — N3941 Urge incontinence: Secondary | ICD-10-CM | POA: Diagnosis not present

## 2019-01-12 DIAGNOSIS — N393 Stress incontinence (female) (male): Secondary | ICD-10-CM | POA: Diagnosis not present

## 2019-01-12 DIAGNOSIS — M6281 Muscle weakness (generalized): Secondary | ICD-10-CM | POA: Diagnosis not present

## 2019-01-12 DIAGNOSIS — R350 Frequency of micturition: Secondary | ICD-10-CM | POA: Diagnosis not present

## 2019-01-28 ENCOUNTER — Telehealth: Payer: Self-pay | Admitting: Family Medicine

## 2019-01-28 NOTE — Telephone Encounter (Signed)
LVM for pt to c/b to reschedule provider not Available

## 2019-02-18 DIAGNOSIS — H35362 Drusen (degenerative) of macula, left eye: Secondary | ICD-10-CM | POA: Diagnosis not present

## 2019-02-18 DIAGNOSIS — H353132 Nonexudative age-related macular degeneration, bilateral, intermediate dry stage: Secondary | ICD-10-CM | POA: Diagnosis not present

## 2019-02-18 DIAGNOSIS — H353221 Exudative age-related macular degeneration, left eye, with active choroidal neovascularization: Secondary | ICD-10-CM | POA: Diagnosis not present

## 2019-02-18 DIAGNOSIS — H35722 Serous detachment of retinal pigment epithelium, left eye: Secondary | ICD-10-CM | POA: Diagnosis not present

## 2019-03-03 ENCOUNTER — Ambulatory Visit: Payer: Self-pay | Admitting: Cardiology

## 2019-03-24 ENCOUNTER — Ambulatory Visit: Payer: Medicare Other | Admitting: Family Medicine

## 2019-03-25 ENCOUNTER — Encounter: Payer: Self-pay | Admitting: Family Medicine

## 2019-03-25 ENCOUNTER — Ambulatory Visit (INDEPENDENT_AMBULATORY_CARE_PROVIDER_SITE_OTHER): Payer: Medicare Other | Admitting: Family Medicine

## 2019-03-25 ENCOUNTER — Other Ambulatory Visit: Payer: Self-pay

## 2019-03-25 VITALS — BP 140/79 | HR 91 | Temp 98.0°F | Wt 230.6 lb

## 2019-03-25 DIAGNOSIS — E119 Type 2 diabetes mellitus without complications: Secondary | ICD-10-CM

## 2019-03-25 DIAGNOSIS — I10 Essential (primary) hypertension: Secondary | ICD-10-CM

## 2019-03-25 DIAGNOSIS — R600 Localized edema: Secondary | ICD-10-CM | POA: Diagnosis not present

## 2019-03-25 DIAGNOSIS — H409 Unspecified glaucoma: Secondary | ICD-10-CM | POA: Diagnosis not present

## 2019-03-25 DIAGNOSIS — E785 Hyperlipidemia, unspecified: Secondary | ICD-10-CM

## 2019-03-25 DIAGNOSIS — H3530 Unspecified macular degeneration: Secondary | ICD-10-CM | POA: Insufficient documentation

## 2019-03-25 DIAGNOSIS — M25562 Pain in left knee: Secondary | ICD-10-CM | POA: Diagnosis not present

## 2019-03-25 DIAGNOSIS — B353 Tinea pedis: Secondary | ICD-10-CM

## 2019-03-25 DIAGNOSIS — M25561 Pain in right knee: Secondary | ICD-10-CM | POA: Diagnosis not present

## 2019-03-25 MED ORDER — METOPROLOL SUCCINATE ER 25 MG PO TB24: 25.0000 mg | ORAL_TABLET | Freq: Every day | ORAL | 1 refills | Status: DC

## 2019-03-25 MED ORDER — LISINOPRIL-HYDROCHLOROTHIAZIDE 10-12.5 MG PO TABS: 1.0000 | ORAL_TABLET | Freq: Every day | ORAL | 1 refills | Status: DC

## 2019-03-25 MED ORDER — PRAVASTATIN SODIUM 20 MG PO TABS: 20.0000 mg | ORAL_TABLET | ORAL | 1 refills | Status: DC

## 2019-03-25 MED ORDER — CLOTRIMAZOLE 1 % EX CREA: 1.0000 "application " | TOPICAL_CREAM | Freq: Two times a day (BID) | CUTANEOUS | 0 refills | Status: DC

## 2019-03-25 MED ORDER — METFORMIN HCL 500 MG PO TABS: 500.0000 mg | ORAL_TABLET | Freq: Every day | ORAL | 1 refills | Status: DC

## 2019-03-25 NOTE — Progress Notes (Signed)
Subjective:  Patient ID: [name], female    DOB: Sep 30, 1945  Age: 74 y.o. MRN: [id number]  CC:  Chief Complaint  Patient presents with  . Medical Management of Chronic Issues    6 month f/u. running out of metoprolol not sure if you can fill or did I need my cardiologist refill it. Patient stated she is having trouble walking. Legs feel weak and feels heavy.  . Edema    foot exam was done noticed swelling in both ankle and patient stated cardiologist do not seen to think this is due to her heart. Patient also stated she have itchiness on top of both feet    HPI SYNAI MAGNER presents for   Diabetes: Type II.  Currently treated with metformin 500 mg QD (diarrhea resolved on once per day).  She is on ACE inhibitor, statin. Microalbumin: Normal in August 2019 Optho, foot exam, pneumovax: Up-to-date. Fasting: variable 129, 149, 179, low of 110, high 179. No postprandials.  Weight gain with pandemic, less walking - some trouble with walking and diet indiscretion.   Wt Readings from Last 3 Encounters:  03/25/19 230 lb 9.6 oz (104.6 kg)  09/09/18 217 lb 3.2 oz (98.5 kg)  05/12/18 227 lb 1.6 oz (103 kg)    Diabetic Foot Exam - Simple   Simple Foot Form Diabetic Foot exam was performed with the following findings: Yes 03/25/2019  9:58 AM  Visual Inspection Sensation Testing Pulse Check Comments     Lab Results  Component Value Date   HGBA1C 6.9 (H) 09/09/2018   HGBA1C 7.2 (A) 01/29/2018   HGBA1C 7.0 (A) 10/29/2017   Lab Results  Component Value Date   MICROALBUR 0.2 12/06/2015   LDLCALC 150 (H) 09/09/2018   CREATININE 0.71 09/09/2018    Hypertension: Toprol-XL 25 mg daily, lisinopril HCTZ 10/12.5 mg daily. Also with history of obstructive sleep apnea on CPAP, neurology eval April 2020, plan for repeat sleep study this year for new machine. Home readings: BP Readings from Last 3 Encounters:  03/25/19 140/79  09/09/18 127/73  05/12/18 138/78   Lab Results   Component Value Date   CREATININE 0.71 09/09/2018   Hyperlipidemia: Pravastatin 20 mg daily ordered - misses 2 doses per week.  Lab Results  Component Value Date   CHOL 229 (H) 09/09/2018   HDL 49 09/09/2018   LDLCALC 150 (H) 09/09/2018   TRIG 151 (H) 09/09/2018   CHOLHDL 4.7 (H) 09/09/2018   Lab Results  Component Value Date   ALT 17 09/09/2018   AST 29 09/09/2018   ALKPHOS 69 09/09/2018   BILITOT 0.5 09/09/2018   Pedal edema History of chronic leg edema, cardiology note reviewed from December 2019, thought to have mild leg edema due to varicose veins, venous insufficiency,Recommended leg elevation and compression stockings. She is taking tamoxifen with history of left breast invasive lobular cancer.  Aching in legs with walking - hip to feet both sides, R worse, improves with rest.  Aches in back at times. Sx's for years, worse with arimidex, better with tamoxifen then some recurrence of similar pains.  Weigh gain as above with pandemic.  appt with cardiology next week. Wearing compression stockings usually. Sore/itchy b/t toes. 6 months, tx: vaseline.     History Patient Active Problem List   Diagnosis Date Noted  . Macular degeneration 03/25/2019  . Glaucoma 03/25/2019  . History of food allergy 10/22/2017  . Anaphylaxis 10/22/2017  . Food allergy 09/03/2017  . Mild persistent asthma  09/03/2017  . Spinal stenosis of lumbar region with neurogenic claudication 08/01/2016  . Pure hypercholesterolemia 08/01/2016  . Osteopenia 10/18/2014  . Obesity (BMI 30-39.9) 06/22/2014  . Perennial allergic rhinitis 03/23/2013  . Cancer of central portion of left female breast ([place]) 08/25/2012  . Obstructive sleep apnea on CPAP 04/21/2012  . Diabetes ([place]) 12/28/2011  . HTN (hypertension) 12/28/2011  . Uterine bleeding 04/30/2011   Past Medical History:  Diagnosis Date  . Allergic rhinitis 03/23/2013  . Allergy   . Anemia    due to vaginal bleeding  . Arthritis    hands  .  Asthmatic bronchitis 09/03/2017  . Breast cancer ([place]) 08/2012   left  . Dental crowns present   . Eczema   . Fibroids   . Headache(784.0)    tension  . History of endometriosis   . Hyperlipidemia   . Hypertension    under control with med., has been on med. x 3 yr.  . OSA (obstructive sleep apnea)   . Postmenopausal bleeding   . Recurrent upper respiratory infection (URI)   . S/P radiation therapy 10/20/2012-12/05/2012   left breast 50.4 gray, lumpectomy cavity boosted to 62.4 gray  . Seasonal allergies   . Sleep apnea    uses CPAP nightly  . Urinary urgency   . White coat hypertension    Past Surgical History:  Procedure Laterality Date  . BACK SURGERY  1997   lumbar  . BREAST LUMPECTOMY WITH NEEDLE LOCALIZATION AND AXILLARY SENTINEL LYMPH NODE BX Left 09/03/2012   Procedure: BREAST LUMPECTOMY WITH NEEDLE LOCALIZATION AND AXILLARY SENTINEL LYMPH NODE BX;  Surgeon: Edward Jolly, MD;  Location: [facility];  Service: General;  Laterality: Left;  . CHOLECYSTECTOMY  1995  . COLONOSCOPY W/ POLYPECTOMY  03/20/2007  . DILATION AND CURETTAGE OF UTERUS  2005  . DILATION AND CURETTAGE OF UTERUS  06/22/2011   Procedure: DILATATION AND CURETTAGE;  Surgeon: Alvino Chapel, MD;  Location: [facility];  Service: Gynecology;  Laterality: N/A;  OK PER KEELA FOR 7:15 START  . DORSAL COMPARTMENT RELEASE Right 05/19/2013   Procedure: RELEASE 1ST  DORSAL COMPARTMENT RIGHT (DEQUERVAIN);  Surgeon: Cammie Sickle., MD;  Location: [facility];  Service: Orthopedics;  Laterality: Right;  . EXPLORATORY LAPAROTOMY  06-30-2007   ATTEMPTED HYSTERECTOMY ABORTED DUE TO EXTENSIVE ENDOMETRIOSIS W/ DENSE PELVIC ADHESIONS INVOLVING UTERUS AND LOWER RECTOSIGMOID  . HYSTEROSCOPY WITH D & C  05/06/2009   Allergies  Allergen Reactions  . Peanut (Diagnostic) Cough, Hypertension and Shortness Of Breath  . Adhesive [Tape] Rash  . Latex Itching and Other (See  Comments)    Causes blisters  . Lovastatin Other (See Comments)    Arthralgias, hyperglycemia with lovastatin.   Prior to Admission medications   Medication Sig Start Date End Date Taking? Authorizing Provider  aspirin 81 MG tablet Take 81 mg by mouth daily.   Yes [provider]  Brimonidine Tartrate-Timolol (COMBIGAN OP) Apply to eye.   Yes [provider]  cholecalciferol (VITAMIN D) 1000 units tablet Take 1,000 Units by mouth daily.   Yes [provider]  Cyanocobalamin (VITAMIN B-12 CR PO) Take 500 mcg by mouth daily.    Yes [provider]  fluticasone (FLONASE) 50 MCG/ACT nasal spray Place 1 spray into both nostrils daily. Patient taking differently: Place 1 spray into both nostrils daily as needed.  09/03/17  Yes Bobbitt, [place] Muta, MD  fluticasone (FLOVENT HFA) 44 MCG/ACT inhaler Inhale  2 puffs into the lungs 2 (two) times daily. With spacer. 10/22/17  Yes Bobbitt, [place] Muta, MD  lisinopril-hydrochlorothiazide (ZESTORETIC) 10-12.5 MG tablet Take 1 tablet by mouth daily. 12/19/18  Yes Wendie Agreste, MD  loratadine (CLARITIN) 10 MG tablet Take 1 tablet (10 mg total) by mouth daily. 05/12/18  Yes Nicholas Lose, MD  metFORMIN (GLUCOPHAGE) 500 MG tablet Take 1 tablet (500 mg total) by mouth daily with breakfast. 08/26/18  Yes Wendie Agreste, MD  metoprolol succinate (TOPROL-XL) 25 MG 24 hr tablet Take 1 tablet (25 mg total) by mouth daily. 05/12/18  Yes Nicholas Lose, MD  pravastatin (PRAVACHOL) 20 MG tablet Take 1 tablet (20 mg total) by mouth every other day. 01/09/19  Yes Wendie Agreste, MD  tamoxifen (NOLVADEX) 10 MG tablet Take 1 tablet (10 mg total) by mouth daily. 11/10/18  Yes Nicholas Lose, MD  Turmeric Curcumin 500 MG CAPS Take by mouth.   Yes [provider]  vitamin C (ASCORBIC ACID) 500 MG tablet Take 500 mg by mouth daily.    Yes [provider]  Zinc 50 MG TABS  12/17/17  Yes [provider]  LISINOPRIL PO  Take 1 tablet by mouth daily.  06/13/11  [provider]   Social History   Socioeconomic History  . Marital status: Married    Spouse name: [name]  . Number of children: 4  . Years of education: College  . Highest education level: Not on file  Occupational History  . Occupation: retired  Tobacco Use  . Smoking status: Never Smoker  . Smokeless tobacco: Never Used  Substance and Sexual Activity  . Alcohol use: No    Alcohol/week: 0.0 standard drinks  . Drug use: No  . Sexual activity: Yes    Comment: menarche 3, P4, Premarin for short while, Provera x 3 years  Other Topics Concern  . Not on file  Social History Narrative   Marital status: married x 91  years; happily married; no abuse.      Children: 4 children (2 sons living; 1 son died in 795 at age 39years old, 1 daughter); six grandchildren; no gg.      Lives:  With husband.      Employed: retired in 2011; [employer].      Tobacco: none       Alcohol:  None      Drugs: none      Exercise:  Rowing 3 days per week.      Seatbelt:  100%      Sunscreen:  Face SPF 15.      Guns:  Loaded mostly secured.      ADLs:  Drives minimally; no assistant devices; independent with all ADLs.       Advanced Directives: YES; FULL CODE but no prolonged measures.      Her nocturia is improved under CPAP use at 10 cm water- AHI is now 0.8 and from 30 at baseline. CMS compliance  6 hours 40 minutes - sleeps on the side, 08-10-11 .FFM - likes it.    . Flonase used prn.     Caffeine Use: 1 cup daily   Social Determinants of Health   Financial Resource Strain:   . Difficulty of Paying Living Expenses: Not on file  Food Insecurity:   . Worried About Charity fundraiser in the Last Year: Not on file  . Ran Out of Food in the Last Year: Not on file  Transportation  Needs:   . Lack of Transportation (Medical): Not on file  . Lack of Transportation (Non-Medical): Not on file  Physical Activity:   . Days of  Exercise per Week: Not on file  . Minutes of Exercise per Session: Not on file  Stress:   . Feeling of Stress : Not on file  Social Connections:   . Frequency of Communication with Friends and Family: Not on file  . Frequency of Social Gatherings with Friends and Family: Not on file  . Attends Religious Services: Not on file  . Active Member of Clubs or Organizations: Not on file  . Attends Archivist Meetings: Not on file  . Marital Status: Not on file  Intimate Partner Violence:   . Fear of Current or Ex-Partner: Not on file  . Emotionally Abused: Not on file  . Physically Abused: Not on file  . Sexually Abused: Not on file    Review of Systems  Constitutional: Negative for fatigue and unexpected weight change.  Respiratory: Negative for chest tightness and shortness of breath.   Cardiovascular: Negative for chest pain, palpitations and leg swelling.  Gastrointestinal: Negative for abdominal pain and blood in stool.  Neurological: Negative for dizziness, syncope, light-headedness and headaches.   Per [facility]Pi.     Objective:   Vitals:   03/25/19 0951  BP: 140/79  Pulse: 91  Temp: 98 F (36.7 C)  TempSrc: Temporal  SpO2: 97%  Weight: 230 lb 9.6 oz (104.6 kg)     Physical Exam Vitals reviewed.  Constitutional:      Appearance: She is well-developed.  HENT:     Head: Normocephalic and atraumatic.  Eyes:     Conjunctiva/sclera: Conjunctivae normal.     Pupils: Pupils are equal, round, and reactive to light.  Neck:     Vascular: No carotid bruit.  Cardiovascular:     Rate and Rhythm: Normal rate and regular rhythm.     Heart sounds: Normal heart sounds.  Pulmonary:     Effort: Pulmonary effort is normal.     Breath sounds: Normal breath sounds.  Abdominal:     Palpations: Abdomen is soft. There is no pulsatile mass.     Tenderness: There is no abdominal tenderness.  Musculoskeletal:     Right lower leg: Edema (Bilateral edema mid tibia 1-2+.  No  stasis changes, no ulceration.) present.     Left lower leg: Edema present.  Skin:    General: Skin is warm and dry.     Comments: Slight macerated/wet appearing skin between second and third, third and fourth, fourth and fifth toes on right foot, and second and third, third and fourth on the left foot.  No ulceration, no bleeding  Neurological:     Mental Status: She is alert and oriented to person, place, and time.  Psychiatric:        Behavior: Behavior normal.        Assessment & Plan:  KYLANA HASLETT is a 74 y.o. female . Type 2 diabetes mellitus without complication, without long-term current use of insulin ([place]) - Plan: HM Diabetes Foot Exam, Hemoglobin A1c, Microalbumin / creatinine urine ratio  -Tolerating Metformin, continue same, check A1c, microalbumin creatinine ratio.  Macular degeneration of left eye, unspecified type Glaucoma of right eye, unspecified glaucoma type  -Continue follow-up with ophthalmology.  Arthralgia of both lower legs - Plan: CK Pedal edema - Plan: Comprehensive metabolic panel  -Increase in weight likely contributing.  Pedal edema, chronic..  Arthralgias  may also be due to increased edema, deconditioning.  Plan on follow-up after lab work and upcoming cardiology visit to discuss leg pains further.  RTC precautions if acute worsening.  -Leg elevation, compression stockings, continue to avoid added sodium.  Tinea pedis of both feet - Plan: clotrimazole (LOTRIMIN) 1 % cream  -Keep areas dry, open to air when seated as possible, start clotrimazole, follow-up if not improving, handout given  Essential hypertension - Plan: lisinopril-hydrochlorothiazide (ZESTORETIC) 10-12.5 MG tablet  -Stable, refilled Zestoretic, metoprolol.  Follow-up with cardiology as planned.  Hyperlipidemia, unspecified hyperlipidemia type - Plan: Lipid Panel, CK, pravastatin (PRAVACHOL) 20 MG tablet  -Check CPK, but less likely arthralgias from statin.  If persistent  arthralgias, could try coming off statin temporarily.  We will follow-up to discuss further next few weeks.  Meds ordered this encounter  Medications  . clotrimazole (LOTRIMIN) 1 % cream    Sig: Apply 1 application topically 2 (two) times daily. To skin between toes.    Dispense:  30 g    Refill:  0  . lisinopril-hydrochlorothiazide (ZESTORETIC) 10-12.5 MG tablet    Sig: Take 1 tablet by mouth daily.    Dispense:  90 tablet    Refill:  1  . metFORMIN (GLUCOPHAGE) 500 MG tablet    Sig: Take 1 tablet (500 mg total) by mouth daily with breakfast.    Dispense:  90 tablet    Refill:  1  . metoprolol succinate (TOPROL-XL) 25 MG 24 hr tablet    Sig: Take 1 tablet (25 mg total) by mouth daily.    Dispense:  90 tablet    Refill:  1  . pravastatin (PRAVACHOL) 20 MG tablet    Sig: Take 1 tablet (20 mg total) by mouth every other day.    Dispense:  90 tablet    Refill:  1   Patient Instructions    Discuss leg swelling and leg symptoms with cardiology at upcoming appointment, then follow up with nme in next 3 weeks. Can also discuss tamoxifen with oncologist as that can also cause swelling (discuss leg pains with oncologist as well to see if that med may be contributing).   Apply the antifungal cream twice per day to area between toes, try to keep open to air when you are seated with legs elevated.  No other med changes at this time, follow-up in 3 weeks to review some of the symptoms from above as well as follow-up from cardiology visit.  Return to the clinic or go to the nearest emergency room if any of your symptoms worsen or new symptoms occur.   Athlete's Foot  Athlete's foot (tinea pedis) is a fungal infection of the skin on your feet. It often occurs on the skin that is between or underneath the toes. It can also occur on the soles of your feet. The infection can spread from person to person (is contagious). It can also spread when a person's bare feet come in contact with the fungus  on shower floors or on items such as shoes. What are the causes? This condition is caused by a fungus that grows in warm, moist places. You can get athlete's foot by sharing shoes, shower stalls, towels, and wet floors with someone who is infected. Not washing your feet or changing your socks often enough can also lead to athlete's foot. What increases the risk? This condition is more likely to develop in:  Men.  People who have a weak body defense system (immune system).  People who have diabetes.  People who use public showers, such as at a gym.  People who wear heavy-duty shoes, such as Environmental manager.  Seasons with warm, humid weather. What are the signs or symptoms? Symptoms of this condition include:  Itchy areas between your toes or on the soles of your feet.  White, flaky, or scaly areas between your toes or on the soles of your feet.  Very itchy small blisters between your toes or on the soles of your feet.  Small cuts in your skin. These cuts can become infected.  Thick or discolored toenails. How is this diagnosed? This condition may be diagnosed with a physical exam and a review of your medical history. Your health care provider may also take a skin or toenail sample to examine under a microscope. How is this treated? This condition is treated with antifungal medicines. These may be applied as powders, ointments, or creams. In severe cases, an oral antifungal medicine may be given. Follow these instructions at home: Medicines  Apply or take over-the-counter and prescription medicines only as told by your health care provider.  Apply your antifungal medicine as told by your health care provider. Do not stop using the antifungal even if your condition improves. Foot care  Do not scratch your feet.  Keep your feet dry: ? Wear cotton or wool socks. Change your socks every day or if they become wet. ? Wear shoes that allow air to flow, such as sandals  or canvas tennis shoes.  Wash and dry your feet, including the area between your toes. Also, wash and dry your feet: ? Every day or as told by your health care provider. ? After exercising. General instructions  Do not let others use towels, shoes, nail clippers, or other personal items that touch your feet.  Protect your feet by wearing sandals in wet areas, such as locker rooms and shared showers.  Keep all follow-up visits as told by your health care provider. This is important.  If you have diabetes, keep your blood sugar under control. Contact a health care provider if:  You have a fever.  You have swelling, soreness, warmth, or redness in your foot.  Your feet are not getting better with treatment.  Your symptoms get worse.  You have new symptoms. Summary  Athlete's foot (tinea pedis) is a fungal infection of the skin on your feet. It often occurs on skin that is between or underneath the toes.  This condition is caused by a fungus that grows in warm, moist places.  Symptoms include white, flaky, or scaly areas between your toes or on the soles of your feet.  This condition is treated with antifungal medicines.  Keep your feet clean. Always dry them thoroughly. This information is not intended to replace advice given to you by your health care provider. Make sure you discuss any questions you have with your health care provider. Document Revised: 02/28/2017 Document Reviewed: 12/24/2016 Elsevier Patient Education  [employer].     If you have lab work done today you will be contacted with your lab results within the next 2 weeks.  If you have not heard from [place] then please contact us. The fastest way to get your results is to register for My Chart.   IF you received an x-ray today, you will receive an invoice from [facility]. Please contact [facility] at [phone] with questions or concerns regarding your invoice.   IF you received  labwork today, you will receive an invoice from [facility]. Please contact [facility] at [phone] with questions or concerns regarding your invoice.   Our billing staff will not be able to assist you with questions regarding bills from these companies.  You will be contacted with the lab results as soon as they are available. The fastest way to get your results is to activate your My Chart account. Instructions are located on the last page of this paperwork. If you have not heard from [place] regarding the results in 2 weeks, please contact this office.          Signed, [name], MD [facility]

## 2019-03-25 NOTE — Patient Instructions (Addendum)
Discuss leg swelling and leg symptoms with cardiology at upcoming appointment, then follow up with nme in next 3 weeks. Can also discuss tamoxifen with oncologist as that can also cause swelling (discuss leg pains with oncologist as well to see if that med may be contributing).   Apply the antifungal cream twice per day to area between toes, try to keep open to air when you are seated with legs elevated.  No other med changes at this time, follow-up in 3 weeks to review some of the symptoms from above as well as follow-up from cardiology visit.  Return to the clinic or go to the nearest emergency room if any of your symptoms worsen or new symptoms occur.   Athlete's Foot  Athlete's foot (tinea pedis) is a fungal infection of the skin on your feet. It often occurs on the skin that is between or underneath the toes. It can also occur on the soles of your feet. The infection can spread from person to person (is contagious). It can also spread when a person's bare feet come in contact with the fungus on shower floors or on items such as shoes. What are the causes? This condition is caused by a fungus that grows in warm, moist places. You can get athlete's foot by sharing shoes, shower stalls, towels, and wet floors with someone who is infected. Not washing your feet or changing your socks often enough can also lead to athlete's foot. What increases the risk? This condition is more likely to develop in:  Men.  People who have a weak body defense system (immune system).  People who have diabetes.  People who use public showers, such as at a gym.  People who wear heavy-duty shoes, such as Environmental manager.  Seasons with warm, humid weather. What are the signs or symptoms? Symptoms of this condition include:  Itchy areas between your toes or on the soles of your feet.  White, flaky, or scaly areas between your toes or on the soles of your feet.  Very itchy small blisters  between your toes or on the soles of your feet.  Small cuts in your skin. These cuts can become infected.  Thick or discolored toenails. How is this diagnosed? This condition may be diagnosed with a physical exam and a review of your medical history. Your health care provider may also take a skin or toenail sample to examine under a microscope. How is this treated? This condition is treated with antifungal medicines. These may be applied as powders, ointments, or creams. In severe cases, an oral antifungal medicine may be given. Follow these instructions at home: Medicines  Apply or take over-the-counter and prescription medicines only as told by your health care provider.  Apply your antifungal medicine as told by your health care provider. Do not stop using the antifungal even if your condition improves. Foot care  Do not scratch your feet.  Keep your feet dry: ? Wear cotton or wool socks. Change your socks every day or if they become wet. ? Wear shoes that allow air to flow, such as sandals or canvas tennis shoes.  Wash and dry your feet, including the area between your toes. Also, wash and dry your feet: ? Every day or as told by your health care provider. ? After exercising. General instructions  Do not let others use towels, shoes, nail clippers, or other personal items that touch your feet.  Protect your feet by wearing sandals in wet areas, such  as locker rooms and shared showers.  Keep all follow-up visits as told by your health care provider. This is important.  If you have diabetes, keep your blood sugar under control. Contact a health care provider if:  You have a fever.  You have swelling, soreness, warmth, or redness in your foot.  Your feet are not getting better with treatment.  Your symptoms get worse.  You have new symptoms. Summary  Athlete's foot (tinea pedis) is a fungal infection of the skin on your feet. It often occurs on skin that is between or  underneath the toes.  This condition is caused by a fungus that grows in warm, moist places.  Symptoms include white, flaky, or scaly areas between your toes or on the soles of your feet.  This condition is treated with antifungal medicines.  Keep your feet clean. Always dry them thoroughly. This information is not intended to replace advice given to you by your health care provider. Make sure you discuss any questions you have with your health care provider. Document Revised: 02/28/2017 Document Reviewed: 12/24/2016 Elsevier Patient Education  [employer].     If you have lab work done today you will be contacted with your lab results within the next 2 weeks.  If you have not heard from [place] then please contact us. The fastest way to get your results is to register for My Chart.   IF you received an x-ray today, you will receive an invoice from [facility]. Please contact [facility] at [phone] with questions or concerns regarding your invoice.   IF you received labwork today, you will receive an invoice from [facility]. Please contact [facility] at [phone] with questions or concerns regarding your invoice.   Our billing staff will not be able to assist you with questions regarding bills from these companies.  You will be contacted with the lab results as soon as they are available. The fastest way to get your results is to activate your My Chart account. Instructions are located on the last page of this paperwork. If you have not heard from [place] regarding the results in 2 weeks, please contact this office.

## 2019-03-26 LAB — HEMOGLOBIN A1C
Est. average glucose Bld gHb Est-mCnc: 157 mg/dL
Hgb A1c MFr Bld: 7.1 % — ABNORMAL HIGH (ref 4.8–5.6)

## 2019-03-26 LAB — COMPREHENSIVE METABOLIC PANEL
ALT: 11 IU/L (ref 0–32)
AST: 20 IU/L (ref 0–40)
Albumin/Globulin Ratio: 1.5 (ref 1.2–2.2)
Albumin: 4.1 g/dL (ref 3.7–4.7)
Alkaline Phosphatase: 100 IU/L (ref 39–117)
BUN/Creatinine Ratio: 23 (ref 12–28)
BUN: 15 mg/dL (ref 8–27)
Bilirubin Total: 0.5 mg/dL (ref 0.0–1.2)
CO2: 23 mmol/L (ref 20–29)
Calcium: 10.4 mg/dL — ABNORMAL HIGH (ref 8.7–10.3)
Chloride: 100 mmol/L (ref 96–106)
Creatinine, Ser: 0.66 mg/dL (ref 0.57–1.00)
GFR calc Af Amer: 101 mL/min/{1.73_m2} (ref >59)
GFR calc non Af Amer: 87 mL/min/{1.73_m2} (ref >59)
Globulin, Total: 2.8 g/dL (ref 1.5–4.5)
Glucose: 167 mg/dL — ABNORMAL HIGH (ref 65–99)
Potassium: 4.7 mmol/L (ref 3.5–5.2)
Sodium: 139 mmol/L (ref 134–144)
Total Protein: 6.9 g/dL (ref 6.0–8.5)

## 2019-03-26 LAB — LIPID PANEL
Chol/HDL Ratio: 4.3 ratio (ref 0.0–4.4)
Cholesterol, Total: 209 mg/dL — ABNORMAL HIGH (ref 100–199)
HDL: 49 mg/dL (ref >39)
LDL Chol Calc (NIH): 135 mg/dL — ABNORMAL HIGH (ref 0–99)
Triglycerides: 140 mg/dL (ref 0–149)
VLDL Cholesterol Cal: 25 mg/dL (ref 5–40)

## 2019-03-26 LAB — MICROALBUMIN / CREATININE URINE RATIO
Creatinine, Urine: 134 mg/dL
Microalb/Creat Ratio: 9 mg/g creat (ref 0–29)
Microalbumin, Urine: 12 ug/mL

## 2019-03-26 LAB — CK: Total CK: 41 U/L (ref 32–182)

## 2019-04-02 ENCOUNTER — Other Ambulatory Visit: Payer: Self-pay

## 2019-04-02 ENCOUNTER — Ambulatory Visit (INDEPENDENT_AMBULATORY_CARE_PROVIDER_SITE_OTHER): Payer: Medicare Other | Admitting: Cardiology

## 2019-04-02 ENCOUNTER — Encounter: Payer: Self-pay | Admitting: Cardiology

## 2019-04-02 VITALS — BP 138/73 | HR 85 | Ht 60.0 in | Wt 227.0 lb

## 2019-04-02 DIAGNOSIS — E7800 Pure hypercholesterolemia, unspecified: Secondary | ICD-10-CM

## 2019-04-02 DIAGNOSIS — I10 Essential (primary) hypertension: Secondary | ICD-10-CM | POA: Diagnosis not present

## 2019-04-02 DIAGNOSIS — I739 Peripheral vascular disease, unspecified: Secondary | ICD-10-CM | POA: Diagnosis not present

## 2019-04-02 DIAGNOSIS — I471 Supraventricular tachycardia: Secondary | ICD-10-CM | POA: Diagnosis not present

## 2019-04-02 MED ORDER — METOPROLOL SUCCINATE ER 25 MG PO TB24: 25.0000 mg | ORAL_TABLET | Freq: Every day | ORAL | 3 refills | Status: DC

## 2019-04-02 NOTE — Progress Notes (Signed)
Primary Physician:  Wendie Agreste, MD   Patient ID: [name], female    DOB: 20-Mar-1945, 74 y.o.   MRN: [id number]  Subjective:    Chief Complaint  Patient presents with  . PSVT  . Hypertension  . Hyperlipidemia    HPI: [name]  is a 74 y.o. female  with PSVT, hypertension, type 2 diabetes, hyperlipidemia, morbid obesity, OSA on CPAP. She is here for 1 year follow up.   Patient is doing well since last seen by [place] 1 year ago.  She has not had any chest pain.  Has chronic dyspnea on exertion that has been stable.  Feels that her weight is contributing to this.  She is complaining of pain in her legs with walking up the stairs, mostly in her right leg.  No ulcerations.  She not had any heart racing or syncopal episodes.  No history of thyroid problems. No history of TIA or CVA.  Past Medical History:  Diagnosis Date  . Allergic rhinitis 03/23/2013  . Allergy   . Anemia    due to vaginal bleeding  . Arthritis    hands  . Asthmatic bronchitis 09/03/2017  . Breast cancer ([place]) 08/2012   left  . Dental crowns present   . Eczema   . Fibroids   . Headache(784.0)    tension  . History of endometriosis   . Hyperlipidemia   . Hypertension    under control with med., has been on med. x 3 yr.  . OSA (obstructive sleep apnea)   . Postmenopausal bleeding   . Recurrent upper respiratory infection (URI)   . S/P radiation therapy 10/20/2012-12/05/2012   left breast 50.4 gray, lumpectomy cavity boosted to 62.4 gray  . Seasonal allergies   . Sleep apnea    uses CPAP nightly  . Urinary urgency   . White coat hypertension     Past Surgical History:  Procedure Laterality Date  . BACK SURGERY  1997   lumbar  . BREAST LUMPECTOMY WITH NEEDLE LOCALIZATION AND AXILLARY SENTINEL LYMPH NODE BX Left 09/03/2012   Procedure: BREAST LUMPECTOMY WITH NEEDLE LOCALIZATION AND AXILLARY SENTINEL LYMPH NODE BX;  Surgeon: Edward Jolly, MD;  Location: [facility];   Service: General;  Laterality: Left;  . CHOLECYSTECTOMY  1995  . COLONOSCOPY W/ POLYPECTOMY  03/20/2007  . DILATION AND CURETTAGE OF UTERUS  2005  . DILATION AND CURETTAGE OF UTERUS  06/22/2011   Procedure: DILATATION AND CURETTAGE;  Surgeon: Alvino Chapel, MD;  Location: [facility];  Service: Gynecology;  Laterality: N/A;  OK PER KEELA FOR 7:15 START  . DORSAL COMPARTMENT RELEASE Right 05/19/2013   Procedure: RELEASE 1ST  DORSAL COMPARTMENT RIGHT (DEQUERVAIN);  Surgeon: Cammie Sickle., MD;  Location: [facility];  Service: Orthopedics;  Laterality: Right;  . EXPLORATORY LAPAROTOMY  06-30-2007   ATTEMPTED HYSTERECTOMY ABORTED DUE TO EXTENSIVE ENDOMETRIOSIS W/ DENSE PELVIC ADHESIONS INVOLVING UTERUS AND LOWER RECTOSIGMOID  . HYSTEROSCOPY WITH D & C  05/06/2009    Social History   Socioeconomic History  . Marital status: Married    Spouse name: [name]  . Number of children: 4  . Years of education: College  . Highest education level: Not on file  Occupational History  . Occupation: retired  Tobacco Use  . Smoking status: Never Smoker  . Smokeless tobacco: Never Used  Substance and Sexual Activity  . Alcohol use: No    Alcohol/week: 0.0 standard  drinks  . Drug use: No  . Sexual activity: Yes    Comment: menarche 1, P4, Premarin for short while, Provera x 3 years  Other Topics Concern  . Not on file  Social History Narrative   Marital status: married x 5  years; happily married; no abuse.      Children: 4 children (2 sons living; 1 son died in [id number] at age 6years old, 1 daughter); six grandchildren; no gg.      Lives:  With husband.      Employed: retired in 2011; [employer].      Tobacco: none       Alcohol:  None      Drugs: none      Exercise:  Rowing 3 days per week.      Seatbelt:  100%      Sunscreen:  Face SPF 15.      Guns:  Loaded mostly secured.      ADLs:  Drives minimally; no assistant devices;  independent with all ADLs.       Advanced Directives: YES; FULL CODE but no prolonged measures.      Her nocturia is improved under CPAP use at 10 cm water- AHI is now 0.8 and from 30 at baseline. CMS compliance  6 hours 40 minutes - sleeps on the side, 08-10-11 .FFM - likes it.    . Flonase used prn.     Caffeine Use: 1 cup daily   Social Determinants of Health   Financial Resource Strain:   . Difficulty of Paying Living Expenses: Not on file  Food Insecurity:   . Worried About Charity fundraiser in the Last Year: Not on file  . Ran Out of Food in the Last Year: Not on file  Transportation Needs:   . Lack of Transportation (Medical): Not on file  . Lack of Transportation (Non-Medical): Not on file  Physical Activity:   . Days of Exercise per Week: Not on file  . Minutes of Exercise per Session: Not on file  Stress:   . Feeling of Stress : Not on file  Social Connections:   . Frequency of Communication with Friends and Family: Not on file  . Frequency of Social Gatherings with Friends and Family: Not on file  . Attends Religious Services: Not on file  . Active Member of Clubs or Organizations: Not on file  . Attends Archivist Meetings: Not on file  . Marital Status: Not on file  Intimate Partner Violence:   . Fear of Current or Ex-Partner: Not on file  . Emotionally Abused: Not on file  . Physically Abused: Not on file  . Sexually Abused: Not on file    Review of Systems  Constitution: Negative for decreased appetite, malaise/fatigue, weight gain and weight loss.  Eyes: Negative for visual disturbance.  Cardiovascular: Negative for chest pain, claudication, dyspnea on exertion, leg swelling, orthopnea, palpitations and syncope.  Respiratory: Negative for hemoptysis and wheezing.   Endocrine: Negative for cold intolerance and heat intolerance.  Hematologic/Lymphatic: Does not bruise/bleed easily.  Skin: Negative for nail changes.  Musculoskeletal: Negative for  muscle weakness and myalgias.  Gastrointestinal: Negative for abdominal pain, change in bowel habit, nausea and vomiting.  Neurological: Negative for difficulty with concentration, dizziness, focal weakness and headaches.  Psychiatric/Behavioral: Negative for altered mental status and suicidal ideas.  All other systems reviewed and are negative.     Objective:  Blood pressure 138/73, pulse 85, height 5' (  1.524 m), weight 227 lb (103 kg), SpO2 97 %. Body mass index is 44.33 kg/m.    Physical Exam  Constitutional: She is oriented to person, place, and time. Vital signs are normal. She appears well-developed and well-nourished.  HENT:  Head: Normocephalic and atraumatic.  Cardiovascular: Normal rate, regular rhythm, normal heart sounds and intact distal pulses.  Pulses:      Dorsalis pedis pulses are 0 on the right side and 0 on the left side.       Posterior tibial pulses are 0 on the right side and 0 on the left side.  Pulses difficult to feel due to bodily habitus  Pulmonary/Chest: Effort normal and breath sounds normal. No accessory muscle usage. No respiratory distress.  Abdominal: Soft. Bowel sounds are normal.  Musculoskeletal:        General: Normal range of motion.     Cervical back: Normal range of motion.  Neurological: She is alert and oriented to person, place, and time.  Skin: Skin is warm and dry.  Vitals reviewed.  Radiology: No results found.  Laboratory examination:    CMP Latest Ref Rng & Units 03/25/2019 09/09/2018 01/29/2018  Glucose 65 - 99 mg/dL 167(H) 120(H) 145(H)  BUN 8 - 27 mg/dL 15 16 12   Creatinine 0.57 - 1.00 mg/dL 0.66 0.71 0.72  Sodium 134 - 144 mmol/L 139 140 138  Potassium 3.5 - 5.2 mmol/L 4.7 4.0 3.9  Chloride 96 - 106 mmol/L 100 100 98  CO2 20 - 29 mmol/L 23 24 23   Calcium 8.7 - 10.3 mg/dL 10.4(H) 10.0 9.5  Total Protein 6.0 - 8.5 g/dL 6.9 7.0 6.6  Total Bilirubin 0.0 - 1.2 mg/dL 0.5 0.5 0.6  Alkaline Phos 39 - 117 IU/L 100 69 76  AST 0 -  40 IU/L 20 29 30   ALT 0 - 32 IU/L 11 17 16    CBC Latest Ref Rng & Units 07/29/2017 04/15/2017 12/11/2016  WBC 3.4 - 10.8 x10E3/uL 5.5 5.6 6.4  Hemoglobin 11.1 - 15.9 g/dL 13.0 12.4 12.8  Hematocrit 34.0 - 46.6 % 38.1 38.7 38.6  Platelets 150 - 379 x10E3/uL 228 234 263   Lipid Panel     Component Value Date/Time   CHOL 209 (H) 03/25/2019 1042   TRIG 140 03/25/2019 1042   HDL 49 03/25/2019 1042   CHOLHDL 4.3 03/25/2019 1042   CHOLHDL 3.7 12/22/2015 0940   VLDL 27 12/22/2015 0940   LDLCALC 135 (H) 03/25/2019 1042   HEMOGLOBIN A1C Lab Results  Component Value Date   HGBA1C 7.1 (H) 03/25/2019   MPG 120 12/22/2015   TSH No results for input(s): TSH in the last 8760 hours.  PRN Meds:. There are no discontinued medications. Current Meds  Medication Sig  . aspirin 81 MG tablet Take 81 mg by mouth daily.  . Brimonidine Tartrate-Timolol (COMBIGAN OP) Apply to eye.  . cholecalciferol (VITAMIN D) 1000 units tablet Take 1,000 Units by mouth daily.  . clotrimazole (LOTRIMIN) 1 % cream Apply 1 application topically 2 (two) times daily. To skin between toes.  . Cyanocobalamin (VITAMIN B-12 CR PO) Take 500 mcg by mouth daily.   . fluticasone (FLONASE) 50 MCG/ACT nasal spray Place 1 spray into both nostrils daily. (Patient taking differently: Place 1 spray into both nostrils daily as needed. )  . fluticasone (FLOVENT HFA) 44 MCG/ACT inhaler Inhale 2 puffs into the lungs 2 (two) times daily. With spacer. (Patient taking differently: Inhale 2 puffs into the lungs as needed. With spacer.)  .  lisinopril-hydrochlorothiazide (ZESTORETIC) 10-12.5 MG tablet Take 1 tablet by mouth daily.  Marland Kitchen loratadine (CLARITIN) 10 MG tablet Take 1 tablet (10 mg total) by mouth daily.  . metFORMIN (GLUCOPHAGE) 500 MG tablet Take 1 tablet (500 mg total) by mouth daily with breakfast.  . metoprolol succinate (TOPROL-XL) 25 MG 24 hr tablet Take 1 tablet (25 mg total) by mouth daily.  . pravastatin (PRAVACHOL) 20 MG tablet  Take 1 tablet (20 mg total) by mouth every other day.  . tamoxifen (NOLVADEX) 10 MG tablet Take 1 tablet (10 mg total) by mouth daily.  . Turmeric Curcumin 500 MG CAPS Take by mouth.  . vitamin C (ASCORBIC ACID) 500 MG tablet Take 500 mg by mouth 2 (two) times daily.   . Zinc 50 MG TABS    Current Facility-Administered Medications for the 04/02/19 encounter (Office Visit) with Miquel Dunn, NP  Medication  . EPINEPHrine (Anaphylaxis) SOLN 0.3 mg    Cardiac Studies:   Event monitor-09/27/15-11-22-15-sinus rhythm, heart rate ranging between 59-138/m. PACs. One run of PSVT seen on 09/29/2015 at 2.26 AM at average heart rate-150/m.   Lexiscan myoview stress test 09/09/2017: 1. Lexiscan stress test was performed. Exercise capacity was not assessed. Stress symptoms included dyspnea, dizziness. Peak blood pressure was 158/56 mmHg. The stress electrocardiogram showed sinus tachycardia, normal stress conduction, no stress arrhythmias and normal stress repolarization. Stress EKG is non diagnostic for ischemia as it is a pharmacologic stress. 2. The overall quality of the study is excellent. There is no evidence of abnormal lung activity. Stress and rest SPECT images demonstrate homogeneous tracer distribution throughout the myocardium. Gated SPECT imaging reveals normal myocardial thickening and wall motion. The left ventricular ejection fraction was normal (76%). 3. Low risk study.   Echocardiogram 09/17/2017: Left ventricle cavity is normal in size. Mild concentric hypertrophy of the left ventricle. Normal global wall motion. Doppler evidence of grade I (impaired) diastolic dysfunction, normal LAP. Calculated EF 56%. Right ventricle cavity is mildly dilated. Normal right ventricular function. Mild tricuspid regurgitation. Mild pulmonary hypertension. Estimated pulmonary artery systolic pressure 32 mmHg IVC is dilated with respiratory variation. This may suggest elevated right heart pressure. Compared to  the store on 11/23/25, mild RV dilatation and mild pulmonary hypertension new.  Assessment:   PSVT (paroxysmal supraventricular tachycardia) ([place]) - Plan: EKG 12-Lead  Primary hypertension  Pure hypercholesterolemia  Morbid obesity ([place])  Claudication ([place])  EKG 04/02/2019: Normal sinus rhythm at 84 bpm, normal axis, nonspecific T wave abnormality.  Recommendations:   Patient is doing well since last seen by [place] 1 year ago.  She has not had any known recurrence of SVT.  Denies any heart racing.  Blood pressure remains stable, will continue with present medications.  She is on pravastatin for management of hyperlipidemia.  I reviewed her labs that were recently performed in January with her PCP, lipids are not very well controlled.  She admits that she can make changes to her diet.  Would recommend aggressive dietary changes and reevaluation of her lipids in the next 2 to 3 months and if no improvement, would make further changes to her statin.    She is complaining of symptoms of claudication particularly in her right leg.  Her symptoms could be related to chronic back pain; however, will obtain lower extremity duplex exclude PAD.  Vascular exam is also difficult to assess due to her bodily habitus. We will see her back after the test for further recommendations and reevaluation.  Miquel Dunn, MSN,  APRN, FNP-C [facility]. [place] Office: [phone] Fax: [phone]

## 2019-04-03 ENCOUNTER — Encounter: Payer: Self-pay | Admitting: Cardiology

## 2019-04-15 ENCOUNTER — Other Ambulatory Visit: Payer: Self-pay

## 2019-04-15 ENCOUNTER — Encounter: Payer: Self-pay | Admitting: Family Medicine

## 2019-04-15 ENCOUNTER — Ambulatory Visit (INDEPENDENT_AMBULATORY_CARE_PROVIDER_SITE_OTHER): Payer: Medicare Other | Admitting: Family Medicine

## 2019-04-15 VITALS — BP 140/79 | HR 84 | Temp 98.0°F | Ht 60.0 in | Wt 227.0 lb

## 2019-04-15 DIAGNOSIS — B353 Tinea pedis: Secondary | ICD-10-CM | POA: Diagnosis not present

## 2019-04-15 DIAGNOSIS — M25562 Pain in left knee: Secondary | ICD-10-CM

## 2019-04-15 DIAGNOSIS — M25561 Pain in right knee: Secondary | ICD-10-CM | POA: Diagnosis not present

## 2019-04-15 DIAGNOSIS — M48061 Spinal stenosis, lumbar region without neurogenic claudication: Secondary | ICD-10-CM | POA: Diagnosis not present

## 2019-04-15 DIAGNOSIS — R600 Localized edema: Secondary | ICD-10-CM | POA: Diagnosis not present

## 2019-04-15 NOTE — Progress Notes (Signed)
Subjective:  Patient ID: [name], female    DOB: 03-Aug-1945  Age: 74 y.o. MRN: [id number]  CC:  Chief Complaint  Patient presents with  . Follow-up    Pt is here to follow up on her leg pain and edema in both legs. and foot rash. pt has seen the cardiologist reguarding her legs. pt states she was scheduled for an ultra sound next thursday.    HPI [name] presents for   Follow-up from January 6 visit.  Pedal edema: Hyperlipidemia discussed last visit, found to have varicose veins, venous insufficiency component.  Previously treated by cardiology, compression stockings and leg elevation discussed.  Additionally has been taking tamoxifen with history of breast cancer.  Reported some aching in her legs with walking, hips to feet both sides, improving with rest.  Possible deconditioning with increased weight, increase in edema.  Compression stockings, salt avoidance, leg elevation discussed and follow-up with cardiology. CPK was normal, but option of temporary cessation of statin discussed for persistent arthralgias.  Cardiology note reviewed January 14.  Plan for repeat lipids in the next 2 to 3 months, then further changes to statin if needed.  Possible claudication in her right leg, lower extremity duplex was ordered to exclude PAD.  Follow-up to be determined.    Compression stockings - most of the time.  Still avoiding salt - no added salt.  Some chronic edema in past as well - worse with increased weight. Still some arthralgias in R leg, some days better than others, some soreness after day of activity. Whole leg at times. No focal weakness. Ultrasound planned next week. still on pravastatin BID. Some back pain, hip pain and R knee pain at times. Back seems to be main area when worse.   No bowel or new bladder incontinence (stress incontineince treated with PT in past), no saddle anesthesia, no lower extremity weakness. Back surgery in 1995- ruptured disk.  Prior  ortho - Dr. Lynann Bologna, few years ago - spinal stenosis.  Option of surgery/shots - declined. Had PT for back.    Tinea pedis Bilateral feet last visit, treated with clotrimazole, handout given. Using BID-  forgot past few days. Working well if using.    History Patient Active Problem List   Diagnosis Date Noted  . Macular degeneration 03/25/2019  . Glaucoma 03/25/2019  . History of food allergy 10/22/2017  . Anaphylaxis 10/22/2017  . Food allergy 09/03/2017  . Mild persistent asthma 09/03/2017  . Spinal stenosis of lumbar region with neurogenic claudication 08/01/2016  . Pure hypercholesterolemia 08/01/2016  . Osteopenia 10/18/2014  . Obesity (BMI 30-39.9) 06/22/2014  . Perennial allergic rhinitis 03/23/2013  . Cancer of central portion of left female breast ([place]) 08/25/2012  . Obstructive sleep apnea on CPAP 04/21/2012  . Diabetes ([place]) 12/28/2011  . HTN (hypertension) 12/28/2011  . Uterine bleeding 04/30/2011   Past Medical History:  Diagnosis Date  . Allergic rhinitis 03/23/2013  . Allergy   . Anemia    due to vaginal bleeding  . Arthritis    hands  . Asthmatic bronchitis 09/03/2017  . Breast cancer ([place]) 08/2012   left  . Dental crowns present   . Eczema   . Fibroids   . Headache(784.0)    tension  . History of endometriosis   . Hyperlipidemia   . Hypertension    under control with med., has been on med. x 3 yr.  . OSA (obstructive sleep apnea)   . Postmenopausal bleeding   .  Recurrent upper respiratory infection (URI)   . S/P radiation therapy 10/20/2012-12/05/2012   left breast 50.4 gray, lumpectomy cavity boosted to 62.4 gray  . Seasonal allergies   . Sleep apnea    uses CPAP nightly  . Urinary urgency   . White coat hypertension    Past Surgical History:  Procedure Laterality Date  . BACK SURGERY  1997   lumbar  . BREAST LUMPECTOMY WITH NEEDLE LOCALIZATION AND AXILLARY SENTINEL LYMPH NODE BX Left 09/03/2012   Procedure: BREAST LUMPECTOMY WITH NEEDLE  LOCALIZATION AND AXILLARY SENTINEL LYMPH NODE BX;  Surgeon: Edward Jolly, MD;  Location: [facility];  Service: General;  Laterality: Left;  . CHOLECYSTECTOMY  1995  . COLONOSCOPY W/ POLYPECTOMY  03/20/2007  . DILATION AND CURETTAGE OF UTERUS  2005  . DILATION AND CURETTAGE OF UTERUS  06/22/2011   Procedure: DILATATION AND CURETTAGE;  Surgeon: Alvino Chapel, MD;  Location[facility];  Service: Gynecology;  Laterality: N/A;  OK PER KEELA FOR 7:15 START  . DORSAL COMPARTMENT RELEASE Right 05/19/2013   Procedure: RELEASE 1ST  DORSAL COMPARTMENT RIGHT (DEQUERVAIN);  Surgeon: Cammie Sickle., MD;  Location: [facility];  Service: Orthopedics;  Laterality: Right;  . EXPLORATORY LAPAROTOMY  06-30-2007   ATTEMPTED HYSTERECTOMY ABORTED DUE TO EXTENSIVE ENDOMETRIOSIS W/ DENSE PELVIC ADHESIONS INVOLVING UTERUS AND LOWER RECTOSIGMOID  . HYSTEROSCOPY WITH D & C  05/06/2009   Allergies  Allergen Reactions  . Peanut (Diagnostic) Cough, Hypertension and Shortness Of Breath  . Adhesive [Tape] Rash  . Latex Itching and Other (See Comments)    Causes blisters  . Lovastatin Other (See Comments)    Arthralgias, hyperglycemia with lovastatin.   Prior to Admission medications   Medication Sig Start Date End Date Taking? Authorizing Provider  aspirin 81 MG tablet Take 81 mg by mouth daily.   Yes [provider]  Brimonidine Tartrate-Timolol (COMBIGAN OP) Apply to eye.   Yes [provider]  cholecalciferol (VITAMIN D) 1000 units tablet Take 1,000 Units by mouth daily.   Yes [provider]  clotrimazole (LOTRIMIN) 1 % cream Apply 1 application topically 2 (two) times daily. To skin between toes. 03/25/19  Yes Wendie Agreste, MD  Cyanocobalamin (VITAMIN B-12 CR PO) Take 500 mcg by mouth daily.    Yes [provider]  fluticasone (FLONASE) 50 MCG/ACT nasal spray Place 1 spray into both nostrils daily. Patient taking  differently: Place 1 spray into both nostrils daily as needed.  09/03/17  Yes Bobbitt, Sedalia Muta, MD  fluticasone (FLOVENT HFA) 44 MCG/ACT inhaler Inhale 2 puffs into the lungs 2 (two) times daily. With spacer. Patient taking differently: Inhale 2 puffs into the lungs as needed. With spacer. 10/22/17  Yes Bobbitt, Sedalia Muta, MD  lisinopril-hydrochlorothiazide (ZESTORETIC) 10-12.5 MG tablet Take 1 tablet by mouth daily. 03/25/19  Yes Wendie Agreste, MD  loratadine (CLARITIN) 10 MG tablet Take 1 tablet (10 mg total) by mouth daily. 05/12/18  Yes Nicholas Lose, MD  metFORMIN (GLUCOPHAGE) 500 MG tablet Take 1 tablet (500 mg total) by mouth daily with breakfast. 03/25/19  Yes Wendie Agreste, MD  metoprolol succinate (TOPROL-XL) 25 MG 24 hr tablet Take 1 tablet (25 mg total) by mouth daily. 04/02/19  Yes Miquel Dunn, NP  pravastatin (PRAVACHOL) 20 MG tablet Take 1 tablet (20 mg total) by mouth every other day. 03/25/19  Yes Wendie Agreste, MD  tamoxifen (NOLVADEX) 10 MG tablet Take 1 tablet (  10 mg total) by mouth daily. 11/10/18  Yes Nicholas Lose, MD  Turmeric Curcumin 500 MG CAPS Take by mouth.   Yes [provider]  vitamin C (ASCORBIC ACID) 500 MG tablet Take 500 mg by mouth 2 (two) times daily.    Yes [provider]  Zinc 50 MG TABS  12/17/17  Yes [provider]  LISINOPRIL PO Take 1 tablet by mouth daily.  06/13/11  [provider]   Social History   Socioeconomic History  . Marital status: Married    Spouse name: [name]  . Number of children: 4  . Years of education: College  . Highest education level: Not on file  Occupational History  . Occupation: retired  Tobacco Use  . Smoking status: Never Smoker  . Smokeless tobacco: Never Used  Substance and Sexual Activity  . Alcohol use: No    Alcohol/week: 0.0 standard drinks  . Drug use: No  . Sexual activity: Yes    Comment: menarche 51, P4, Premarin for short while, Provera x 3 years    Other Topics Concern  . Not on file  Social History Narrative   Marital status: married x 30  years; happily married; no abuse.      Children: 4 children (2 sons living; 1 son died in [id number] at age 68years old, 1 daughter); six grandchildren; no gg.      Lives:  With husband.      Employed: retired in 2011; [employer].      Tobacco: none       Alcohol:  None      Drugs: none      Exercise:  Rowing 3 days per week.      Seatbelt:  100%      Sunscreen:  Face SPF 15.      Guns:  Loaded mostly secured.      ADLs:  Drives minimally; no assistant devices; independent with all ADLs.       Advanced Directives: YES; FULL CODE but no prolonged measures.      Her nocturia is improved under CPAP use at 10 cm water- AHI is now 0.8 and from 30 at baseline. CMS compliance  6 hours 40 minutes - sleeps on the side, 08-10-11 .FFM - likes it.    . Flonase used prn.     Caffeine Use: 1 cup daily   Social Determinants of Health   Financial Resource Strain:   . Difficulty of Paying Living Expenses: Not on file  Food Insecurity:   . Worried About Charity fundraiser in the Last Year: Not on file  . Ran Out of Food in the Last Year: Not on file  Transportation Needs:   . Lack of Transportation (Medical): Not on file  . Lack of Transportation (Non-Medical): Not on file  Physical Activity:   . Days of Exercise per Week: Not on file  . Minutes of Exercise per Session: Not on file  Stress:   . Feeling of Stress : Not on file  Social Connections:   . Frequency of Communication with Friends and Family: Not on file  . Frequency of Social Gatherings with Friends and Family: Not on file  . Attends Religious Services: Not on file  . Active Member of Clubs or Organizations: Not on file  . Attends Archivist Meetings: Not on file  . Marital Status: Not on file  Intimate Partner Violence:   . Fear of Current or Ex-Partner: Not on  file  . Emotionally Abused: Not on file  .  Physically Abused: Not on file  . Sexually Abused: Not on file    Review of Systems   Objective:   Vitals:   04/15/19 1003  BP: 140/79  Pulse: 84  Temp: 98 F (36.7 C)  TempSrc: Temporal  SpO2: 99%  Weight: 227 lb (103 kg)  Height: 5' (1.524 m)     Physical Exam Vitals reviewed.  Constitutional:      General: She is not in acute distress.    Appearance: She is well-developed. She is obese.  HENT:     Head: Normocephalic and atraumatic.  Eyes:     Conjunctiva/sclera: Conjunctivae normal.     Pupils: Pupils are equal, round, and reactive to light.  Neck:     Vascular: No carotid bruit.  Cardiovascular:     Rate and Rhythm: Normal rate and regular rhythm.     Heart sounds: Normal heart sounds.  Pulmonary:     Effort: Pulmonary effort is normal.     Breath sounds: Normal breath sounds. No wheezing.  Abdominal:     Palpations: Abdomen is soft. There is no pulsatile mass.     Tenderness: There is no abdominal tenderness.  Musculoskeletal:     Right lower leg: Edema (1+ pedal edema without ulcerations.) present.     Left lower leg: Edema present.     Comments: Low back, flexion 90 degrees, intact but slightly decreased lateral flexion, rotation bilaterally.  Negative seated straight leg raise.  Able to toe and heel walk a few steps.  No midline bony tenderness/focal bony tenderness.  Skin:    General: Skin is warm and dry.     Comments: Slight wetness between toes bilateral feet.  No ulcerations, no erythema or discharge.  Neurological:     Mental Status: She is alert and oriented to person, place, and time.     Comments: 1+ patellar reflex, somewhat difficult with habitus.  Psychiatric:        Behavior: Behavior normal.     Assessment & Plan:  JA CARPENTIER is a 74 y.o. female . Tinea pedis of both feet  -Slight improvement, continue clotrimazole.  Arthralgia of both lower legs Spinal stenosis of lumbar region, unspecified whether neurogenic claudication  present  -Cardiology note reviewed, differential includes vascular versus neurogenic claudication with either history of spinal stenosis in the past.  Apparently had been recommended to have either surgery or injection but just had physical therapy.  Recommended meeting with orthopedic/spine specialist.  Can refer when she is ready.  Initial plan for ultrasound to rule out vascular claudication  unlikely statin arthralgia/myopathy, no changes for now.  Repeat lipids next few months as may need higher dosing.  Pedal edema  -Stable, chronic symptoms.  Continue compression stockings, weight loss should also help and follow-up with cardiology as planned.  No orders of the defined types were placed in this encounter.  Patient Instructions    I would recommend meeting with Dr. Ron Agee to discuss your back pain and leg pain as may be related to prior spinal stenosis. Let me know and I can send referral if needed.   Keep follow up for ultrasound of leg.   Continue clotrimazole athlete's foot until that area has resolved.  Continue compression stockings, but would consider fluid pill or can discuss fluid pills with cardiology if worsening swelling.   Recheck 3 months for repeat cholesterol testing, sooner if needed.  Return to the clinic or  go to the nearest emergency room if any of your symptoms worsen or new symptoms occur.    Acute Back Pain, Adult Acute back pain is sudden and usually short-lived. It is often caused by an injury to the muscles and tissues in the back. The injury may result from:  A muscle or ligament getting overstretched or torn (strained). Ligaments are tissues that connect bones to each other. Lifting something improperly can cause a back strain.  Wear and tear (degeneration) of the spinal disks. Spinal disks are circular tissue that provides cushioning between the bones of the spine (vertebrae).  Twisting motions, such as while playing sports or doing yard work.  A  hit to the back.  Arthritis. You may have a physical exam, lab tests, and imaging tests to find the cause of your pain. Acute back pain usually goes away with rest and home care. Follow these instructions at home: Managing pain, stiffness, and swelling  Take over-the-counter and prescription medicines only as told by your health care provider.  Your health care provider may recommend applying ice during the first 24-48 hours after your pain starts. To do this: ? Put ice in a plastic bag. ? Place a towel between your skin and the bag. ? Leave the ice on for 20 minutes, 2-3 times a day.  If directed, apply heat to the affected area as often as told by your health care provider. Use the heat source that your health care provider recommends, such as a moist heat pack or a heating pad. ? Place a towel between your skin and the heat source. ? Leave the heat on for 20-30 minutes. ? Remove the heat if your skin turns bright red. This is especially important if you are unable to feel pain, heat, or cold. You have a greater risk of getting burned. Activity   Do not stay in bed. Staying in bed for more than 1-2 days can delay your recovery.  Sit up and stand up straight. Avoid leaning forward when you sit, or hunching over when you stand. ? If you work at a desk, sit close to it so you do not need to lean over. Keep your chin tucked in. Keep your neck drawn back, and keep your elbows bent at a right angle. Your arms should look like the letter "L." ? Sit high and close to the steering wheel when you drive. Add lower back (lumbar) support to your car seat, if needed.  Take short walks on even surfaces as soon as you are able. Try to increase the length of time you walk each day.  Do not sit, drive, or stand in one place for more than 30 minutes at a time. Sitting or standing for long periods of time can put stress on your back.  Do not drive or use heavy machinery while taking prescription pain  medicine.  Use proper lifting techniques. When you bend and lift, use positions that put less stress on your back: ? Morse your knees. ? Keep the load close to your body. ? Avoid twisting.  Exercise regularly as told by your health care provider. Exercising helps your back heal faster and helps prevent back injuries by keeping muscles strong and flexible.  Work with a physical therapist to make a safe exercise program, as recommended by your health care provider. Do any exercises as told by your physical therapist. Lifestyle  Maintain a healthy weight. Extra weight puts stress on your back and makes it difficult to  have good posture.  Avoid activities or situations that make you feel anxious or stressed. Stress and anxiety increase muscle tension and can make back pain worse. Learn ways to manage anxiety and stress, such as through exercise. General instructions  Sleep on a firm mattress in a comfortable position. Try lying on your side with your knees slightly bent. If you lie on your back, put a pillow under your knees.  Follow your treatment plan as told by your health care provider. This may include: ? Cognitive or behavioral therapy. ? Acupuncture or massage therapy. ? Meditation or yoga. Contact a health care provider if:  You have pain that is not relieved with rest or medicine.  You have increasing pain going down into your legs or buttocks.  Your pain does not improve after 2 weeks.  You have pain at night.  You lose weight without trying.  You have a fever or chills. Get help right away if:  You develop new bowel or bladder control problems.  You have unusual weakness or numbness in your arms or legs.  You develop nausea or vomiting.  You develop abdominal pain.  You feel faint. Summary  Acute back pain is sudden and usually short-lived.  Use proper lifting techniques. When you bend and lift, use positions that put less stress on your back.  Take  over-the-counter and prescription medicines and apply heat or ice as directed by your health care provider. This information is not intended to replace advice given to you by your health care provider. Make sure you discuss any questions you have with your health care provider. Document Revised: 06/24/2018 Document Reviewed: 10/17/2016 Elsevier Patient Education  [place].  Peripheral Edema  Peripheral edema is swelling that is caused by a buildup of fluid. Peripheral edema most often affects the lower legs, ankles, and feet. It can also develop in the arms, hands, and face. The area of the body that has peripheral edema will look swollen. It may also feel heavy or warm. Your clothes may start to feel tight. Pressing on the area may make a temporary dent in your skin. You may not be able to move your swollen arm or leg as much as usual. There are many causes of peripheral edema. It can happen because of a complication of other conditions such as congestive heart failure, kidney disease, or a problem with your blood circulation. It also can be a side effect of certain medicines or because of an infection. It often happens to women during pregnancy. Sometimes, the cause is not known. Follow these instructions at home: Managing pain, stiffness, and swelling   Raise (elevate) your legs while you are sitting or lying down.  Move around often to prevent stiffness and to lessen swelling.  Do not sit or stand for long periods of time.  Wear support stockings as told by your health care provider. Medicines  Take over-the-counter and prescription medicines only as told by your health care provider.  Your health care provider may prescribe medicine to help your body get rid of excess water (diuretic). General instructions  Pay attention to any changes in your symptoms.  Follow instructions from your health care provider about limiting salt (sodium) in your diet. Sometimes, eating less  salt may reduce swelling.  Moisturize skin daily to help prevent skin from cracking and draining.  Keep all follow-up visits as told by your health care provider. This is important. Contact a health care provider if you have:  A  fever.  Edema that starts suddenly or is getting worse, especially if you are pregnant or have a medical condition.  Swelling in only one leg.  Increased swelling, redness, or pain in one or both of your legs.  Drainage or sores at the area where you have edema. Get help right away if you:  Develop shortness of breath, especially when you are lying down.  Have pain in your chest or abdomen.  Feel weak.  Feel faint. Summary  Peripheral edema is swelling that is caused by a buildup of fluid. Peripheral edema most often affects the lower legs, ankles, and feet.  Move around often to prevent stiffness and to lessen swelling. Do not sit or stand for long periods of time.  Pay attention to any changes in your symptoms.  Contact a health care provider if you have edema that starts suddenly or is getting worse, especially if you are pregnant or have a medical condition.  Get help right away if you develop shortness of breath, especially when lying down. This information is not intended to replace advice given to you by your health care provider. Make sure you discuss any questions you have with your health care provider. Document Revised: 11/27/2017 Document Reviewed: 11/27/2017 Elsevier Patient Education  [employer].      If you have lab work done today you will be contacted with your lab results within the next 2 weeks.  If you have not heard from [place] then please contact us. The fastest way to get your results is to register for My Chart.   IF you received an x-ray today, you will receive an invoice from [facility]. Please contact [facility] at [phone] with questions or concerns regarding your invoice.   IF you  received labwork today, you will receive an invoice from [facility]. Please contact [facility] at [phone] with questions or concerns regarding your invoice.   Our billing staff will not be able to assist you with questions regarding bills from these companies.  You will be contacted with the lab results as soon as they are available. The fastest way to get your results is to activate your My Chart account. Instructions are located on the last page of this paperwork. If you have not heard from [place] regarding the results in 2 weeks, please contact this office.         Signed, [name], MD [facility]

## 2019-04-15 NOTE — Patient Instructions (Addendum)
I would recommend meeting with Dr. Ron Agee to discuss your back pain and leg pain as may be related to prior spinal stenosis. Let me know and I can send referral if needed.   Keep follow up for ultrasound of leg.   Continue clotrimazole athlete's foot until that area has resolved.  Continue compression stockings, but would consider fluid pill or can discuss fluid pills with cardiology if worsening swelling.   Recheck 3 months for repeat cholesterol testing, sooner if needed.  Return to the clinic or go to the nearest emergency room if any of your symptoms worsen or new symptoms occur.    Acute Back Pain, Adult Acute back pain is sudden and usually short-lived. It is often caused by an injury to the muscles and tissues in the back. The injury may result from:  A muscle or ligament getting overstretched or torn (strained). Ligaments are tissues that connect bones to each other. Lifting something improperly can cause a back strain.  Wear and tear (degeneration) of the spinal disks. Spinal disks are circular tissue that provides cushioning between the bones of the spine (vertebrae).  Twisting motions, such as while playing sports or doing yard work.  A hit to the back.  Arthritis. You may have a physical exam, lab tests, and imaging tests to find the cause of your pain. Acute back pain usually goes away with rest and home care. Follow these instructions at home: Managing pain, stiffness, and swelling  Take over-the-counter and prescription medicines only as told by your health care provider.  Your health care provider may recommend applying ice during the first 24-48 hours after your pain starts. To do this: ? Put ice in a plastic bag. ? Place a towel between your skin and the bag. ? Leave the ice on for 20 minutes, 2-3 times a day.  If directed, apply heat to the affected area as often as told by your health care provider. Use the heat source that your health care provider  recommends, such as a moist heat pack or a heating pad. ? Place a towel between your skin and the heat source. ? Leave the heat on for 20-30 minutes. ? Remove the heat if your skin turns bright red. This is especially important if you are unable to feel pain, heat, or cold. You have a greater risk of getting burned. Activity   Do not stay in bed. Staying in bed for more than 1-2 days can delay your recovery.  Sit up and stand up straight. Avoid leaning forward when you sit, or hunching over when you stand. ? If you work at a desk, sit close to it so you do not need to lean over. Keep your chin tucked in. Keep your neck drawn back, and keep your elbows bent at a right angle. Your arms should look like the letter "L." ? Sit high and close to the steering wheel when you drive. Add lower back (lumbar) support to your car seat, if needed.  Take short walks on even surfaces as soon as you are able. Try to increase the length of time you walk each day.  Do not sit, drive, or stand in one place for more than 30 minutes at a time. Sitting or standing for long periods of time can put stress on your back.  Do not drive or use heavy machinery while taking prescription pain medicine.  Use proper lifting techniques. When you bend and lift, use positions that put less stress on your  back: ? [employer]. ? Keep the load close to your body. ? Avoid twisting.  Exercise regularly as told by your health care provider. Exercising helps your back heal faster and helps prevent back injuries by keeping muscles strong and flexible.  Work with a physical therapist to make a safe exercise program, as recommended by your health care provider. Do any exercises as told by your physical therapist. Lifestyle  Maintain a healthy weight. Extra weight puts stress on your back and makes it difficult to have good posture.  Avoid activities or situations that make you feel anxious or stressed. Stress and anxiety  increase muscle tension and can make back pain worse. Learn ways to manage anxiety and stress, such as through exercise. General instructions  Sleep on a firm mattress in a comfortable position. Try lying on your side with your knees slightly bent. If you lie on your back, put a pillow under your knees.  Follow your treatment plan as told by your health care provider. This may include: ? Cognitive or behavioral therapy. ? Acupuncture or massage therapy. ? Meditation or yoga. Contact a health care provider if:  You have pain that is not relieved with rest or medicine.  You have increasing pain going down into your legs or buttocks.  Your pain does not improve after 2 weeks.  You have pain at night.  You lose weight without trying.  You have a fever or chills. Get help right away if:  You develop new bowel or bladder control problems.  You have unusual weakness or numbness in your arms or legs.  You develop nausea or vomiting.  You develop abdominal pain.  You feel faint. Summary  Acute back pain is sudden and usually short-lived.  Use proper lifting techniques. When you bend and lift, use positions that put less stress on your back.  Take over-the-counter and prescription medicines and apply heat or ice as directed by your health care provider. This information is not intended to replace advice given to you by your health care provider. Make sure you discuss any questions you have with your health care provider. Document Revised: 06/24/2018 Document Reviewed: 10/17/2016 Elsevier Patient Education  [place].  Peripheral Edema  Peripheral edema is swelling that is caused by a buildup of fluid. Peripheral edema most often affects the lower legs, ankles, and feet. It can also develop in the arms, hands, and face. The area of the body that has peripheral edema will look swollen. It may also feel heavy or warm. Your clothes may start to feel tight. Pressing on the  area may make a temporary dent in your skin. You may not be able to move your swollen arm or leg as much as usual. There are many causes of peripheral edema. It can happen because of a complication of other conditions such as congestive heart failure, kidney disease, or a problem with your blood circulation. It also can be a side effect of certain medicines or because of an infection. It often happens to women during pregnancy. Sometimes, the cause is not known. Follow these instructions at home: Managing pain, stiffness, and swelling   Raise (elevate) your legs while you are sitting or lying down.  Move around often to prevent stiffness and to lessen swelling.  Do not sit or stand for long periods of time.  Wear support stockings as told by your health care provider. Medicines  Take over-the-counter and prescription medicines only as told by your health care  provider.  Your health care provider may prescribe medicine to help your body get rid of excess water (diuretic). General instructions  Pay attention to any changes in your symptoms.  Follow instructions from your health care provider about limiting salt (sodium) in your diet. Sometimes, eating less salt may reduce swelling.  Moisturize skin daily to help prevent skin from cracking and draining.  Keep all follow-up visits as told by your health care provider. This is important. Contact a health care provider if you have:  A fever.  Edema that starts suddenly or is getting worse, especially if you are pregnant or have a medical condition.  Swelling in only one leg.  Increased swelling, redness, or pain in one or both of your legs.  Drainage or sores at the area where you have edema. Get help right away if you:  Develop shortness of breath, especially when you are lying down.  Have pain in your chest or abdomen.  Feel weak.  Feel faint. Summary  Peripheral edema is swelling that is caused by a buildup of fluid.  Peripheral edema most often affects the lower legs, ankles, and feet.  Move around often to prevent stiffness and to lessen swelling. Do not sit or stand for long periods of time.  Pay attention to any changes in your symptoms.  Contact a health care provider if you have edema that starts suddenly or is getting worse, especially if you are pregnant or have a medical condition.  Get help right away if you develop shortness of breath, especially when lying down. This information is not intended to replace advice given to you by your health care provider. Make sure you discuss any questions you have with your health care provider. Document Revised: 11/27/2017 Document Reviewed: 11/27/2017 Elsevier Patient Education  [employer].      If you have lab work done today you will be contacted with your lab results within the next 2 weeks.  If you have not heard from [place] then please contact us. The fastest way to get your results is to register for My Chart.   IF you received an x-ray today, you will receive an invoice from [facility]. Please contact [facility] at [phone] with questions or concerns regarding your invoice.   IF you received labwork today, you will receive an invoice from [facility]. Please contact [facility] at [phone] with questions or concerns regarding your invoice.   Our billing staff will not be able to assist you with questions regarding bills from these companies.  You will be contacted with the lab results as soon as they are available. The fastest way to get your results is to activate your My Chart account. Instructions are located on the last page of this paperwork. If you have not heard from [place] regarding the results in 2 weeks, please contact this office.

## 2019-04-23 ENCOUNTER — Other Ambulatory Visit: Payer: Self-pay

## 2019-04-23 ENCOUNTER — Ambulatory Visit (INDEPENDENT_AMBULATORY_CARE_PROVIDER_SITE_OTHER): Payer: Medicare Other

## 2019-04-23 DIAGNOSIS — I739 Peripheral vascular disease, unspecified: Secondary | ICD-10-CM

## 2019-04-29 DIAGNOSIS — H2511 Age-related nuclear cataract, right eye: Secondary | ICD-10-CM | POA: Diagnosis not present

## 2019-04-29 DIAGNOSIS — H35722 Serous detachment of retinal pigment epithelium, left eye: Secondary | ICD-10-CM | POA: Diagnosis not present

## 2019-04-29 DIAGNOSIS — H353221 Exudative age-related macular degeneration, left eye, with active choroidal neovascularization: Secondary | ICD-10-CM | POA: Diagnosis not present

## 2019-04-29 DIAGNOSIS — H353132 Nonexudative age-related macular degeneration, bilateral, intermediate dry stage: Secondary | ICD-10-CM | POA: Diagnosis not present

## 2019-04-30 ENCOUNTER — Ambulatory Visit (INDEPENDENT_AMBULATORY_CARE_PROVIDER_SITE_OTHER): Payer: Medicare Other | Admitting: Cardiology

## 2019-04-30 ENCOUNTER — Encounter: Payer: Self-pay | Admitting: Cardiology

## 2019-04-30 ENCOUNTER — Other Ambulatory Visit: Payer: Self-pay

## 2019-04-30 VITALS — BP 149/78 | HR 80 | Temp 98.0°F | Ht 61.0 in | Wt 228.1 lb

## 2019-04-30 DIAGNOSIS — I739 Peripheral vascular disease, unspecified: Secondary | ICD-10-CM

## 2019-04-30 DIAGNOSIS — I10 Essential (primary) hypertension: Secondary | ICD-10-CM | POA: Diagnosis not present

## 2019-04-30 DIAGNOSIS — E7800 Pure hypercholesterolemia, unspecified: Secondary | ICD-10-CM | POA: Diagnosis not present

## 2019-04-30 MED ORDER — ROSUVASTATIN CALCIUM 20 MG PO TABS: 20.0000 mg | ORAL_TABLET | Freq: Every day | ORAL | 1 refills | Status: DC

## 2019-04-30 NOTE — Progress Notes (Signed)
Primary Physician:  Wendie Agreste, MD   Patient ID: [name], female    DOB: July 15, 1945, 74 y.o.   MRN: [id number]  Subjective:    Chief Complaint  Patient presents with  . Tachycardia  . Hypertension  . Hyperlipidemia  . Follow-up    testing    HPI: [name]  is a 74 y.o. female  with PSVT, hypertension, type 2 diabetes, hyperlipidemia, morbid obesity, OSA on CPAP. Recently seen for annual follow up, mentioned claudication symptoms, particularly in her right leg. She underwent lower extremity duplex and now presents for follow up.   She feels that her right leg pain has improved, trying to be more active. No ulcerations.   She not had any heart racing or syncopal episodes.  No history of thyroid problems. No history of TIA or CVA.  Past Medical History:  Diagnosis Date  . Allergic rhinitis 03/23/2013  . Allergy   . Anemia    due to vaginal bleeding  . Arthritis    hands  . Asthmatic bronchitis 09/03/2017  . Breast cancer ([place]) 08/2012   left  . Dental crowns present   . Eczema   . Fibroids   . Headache(784.0)    tension  . History of endometriosis   . Hyperlipidemia   . Hypertension    under control with med., has been on med. x 3 yr.  . OSA (obstructive sleep apnea)   . Postmenopausal bleeding   . Recurrent upper respiratory infection (URI)   . S/P radiation therapy 10/20/2012-12/05/2012   left breast 50.4 gray, lumpectomy cavity boosted to 62.4 gray  . Seasonal allergies   . Sleep apnea    uses CPAP nightly  . Urinary urgency   . White coat hypertension     Past Surgical History:  Procedure Laterality Date  . BACK SURGERY  1997   lumbar  . BREAST LUMPECTOMY WITH NEEDLE LOCALIZATION AND AXILLARY SENTINEL LYMPH NODE BX Left 09/03/2012   Procedure: BREAST LUMPECTOMY WITH NEEDLE LOCALIZATION AND AXILLARY SENTINEL LYMPH NODE BX;  Surgeon: Edward Jolly, MD;  Location: [place];  Service: General;  Laterality: Left;  .  CHOLECYSTECTOMY  1995  . COLONOSCOPY W/ POLYPECTOMY  03/20/2007  . DILATION AND CURETTAGE OF UTERUS  2005  . DILATION AND CURETTAGE OF UTERUS  06/22/2011   Procedure: DILATATION AND CURETTAGE;  Surgeon: Alvino Chapel, MD;  Location[facility];  Service: Gynecology;  Laterality: N/A;  OK PER KEELA FOR 7:15 START  . DORSAL COMPARTMENT RELEASE Right 05/19/2013   Procedure: RELEASE 1ST  DORSAL COMPARTMENT RIGHT (DEQUERVAIN);  Surgeon: Cammie Sickle., MD;  Location: [facility];  Service: Orthopedics;  Laterality: Right;  . EXPLORATORY LAPAROTOMY  06-30-2007   ATTEMPTED HYSTERECTOMY ABORTED DUE TO EXTENSIVE ENDOMETRIOSIS W/ DENSE PELVIC ADHESIONS INVOLVING UTERUS AND LOWER RECTOSIGMOID  . HYSTEROSCOPY WITH D & C  05/06/2009    Social History   Socioeconomic History  . Marital status: Married    Spouse name: [name]  . Number of children: 4  . Years of education: College  . Highest education level: Not on file  Occupational History  . Occupation: retired  Tobacco Use  . Smoking status: Never Smoker  . Smokeless tobacco: Never Used  Substance and Sexual Activity  . Alcohol use: No    Alcohol/week: 0.0 standard drinks  . Drug use: No  . Sexual activity: Yes    Comment: menarche 57, P4, Premarin for  short while, Provera x 3 years  Other Topics Concern  . Not on file  Social History Narrative   Marital status: married x 41  years; happily married; no abuse.      Children: 4 children (2 sons living; 1 son died in 6656 at age 28years old, 1 daughter); six grandchildren; no gg.      Lives:  With husband.      Employed: retired in 2011; [employer].      Tobacco: none       Alcohol:  None      Drugs: none      Exercise:  Rowing 3 days per week.      Seatbelt:  100%      Sunscreen:  Face SPF 15.      Guns:  Loaded mostly secured.      ADLs:  Drives minimally; no assistant devices; independent with all ADLs.       Advanced  Directives: YES; FULL CODE but no prolonged measures.      Her nocturia is improved under CPAP use at 10 cm water- AHI is now 0.8 and from 30 at baseline. CMS compliance  6 hours 40 minutes - sleeps on the side, 08-10-11 .FFM - likes it.    . Flonase used prn.     Caffeine Use: 1 cup daily   Social Determinants of Health   Financial Resource Strain:   . Difficulty of Paying Living Expenses: Not on file  Food Insecurity:   . Worried About Charity fundraiser in the Last Year: Not on file  . Ran Out of Food in the Last Year: Not on file  Transportation Needs:   . Lack of Transportation (Medical): Not on file  . Lack of Transportation (Non-Medical): Not on file  Physical Activity:   . Days of Exercise per Week: Not on file  . Minutes of Exercise per Session: Not on file  Stress:   . Feeling of Stress : Not on file  Social Connections:   . Frequency of Communication with Friends and Family: Not on file  . Frequency of Social Gatherings with Friends and Family: Not on file  . Attends Religious Services: Not on file  . Active Member of Clubs or Organizations: Not on file  . Attends Archivist Meetings: Not on file  . Marital Status: Not on file  Intimate Partner Violence:   . Fear of Current or Ex-Partner: Not on file  . Emotionally Abused: Not on file  . Physically Abused: Not on file  . Sexually Abused: Not on file    Review of Systems  Constitution: Negative for decreased appetite, malaise/fatigue, weight gain and weight loss.  Eyes: Negative for visual disturbance.  Cardiovascular: Positive for claudication. Negative for chest pain, dyspnea on exertion, leg swelling, orthopnea, palpitations and syncope.  Respiratory: Negative for hemoptysis and wheezing.   Endocrine: Negative for cold intolerance and heat intolerance.  Hematologic/Lymphatic: Does not bruise/bleed easily.  Skin: Negative for nail changes.  Musculoskeletal: Negative for muscle weakness and myalgias.    Gastrointestinal: Negative for abdominal pain, change in bowel habit, nausea and vomiting.  Neurological: Negative for difficulty with concentration, dizziness, focal weakness and headaches.  Psychiatric/Behavioral: Negative for altered mental status and suicidal ideas.  All other systems reviewed and are negative.     Objective:  Blood pressure (!) 149/78, pulse 80, temperature 98 F (36.7 C), height 5\' 1"  (1.549 m), weight 228 lb 1.6 oz (103.5 kg), SpO2  98 %. Body mass index is 43.1 kg/m.    Physical Exam  Constitutional: She is oriented to person, place, and time. Vital signs are normal. She appears well-developed and well-nourished.  HENT:  Head: Normocephalic and atraumatic.  Cardiovascular: Normal rate, regular rhythm, normal heart sounds and intact distal pulses.  Pulses:      Dorsalis pedis pulses are 0 on the right side and 0 on the left side.       Posterior tibial pulses are 0 on the right side and 0 on the left side.  Pulses difficult to feel due to bodily habitus  Pulmonary/Chest: Effort normal and breath sounds normal. No accessory muscle usage. No respiratory distress.  Abdominal: Soft. Bowel sounds are normal.  Musculoskeletal:        General: Normal range of motion.     Cervical back: Normal range of motion.  Neurological: She is alert and oriented to person, place, and time.  Skin: Skin is warm and dry.  Vitals reviewed.  Radiology: No results found.  Laboratory examination:    CMP Latest Ref Rng & Units 03/25/2019 09/09/2018 01/29/2018  Glucose 65 - 99 mg/dL 167(H) 120(H) 145(H)  BUN 8 - 27 mg/dL 15 16 12   Creatinine 0.57 - 1.00 mg/dL 0.66 0.71 0.72  Sodium 134 - 144 mmol/L 139 140 138  Potassium 3.5 - 5.2 mmol/L 4.7 4.0 3.9  Chloride 96 - 106 mmol/L 100 100 98  CO2 20 - 29 mmol/L 23 24 23   Calcium 8.7 - 10.3 mg/dL 10.4(H) 10.0 9.5  Total Protein 6.0 - 8.5 g/dL 6.9 7.0 6.6  Total Bilirubin 0.0 - 1.2 mg/dL 0.5 0.5 0.6  Alkaline Phos 39 - 117 IU/L 100 69  76  AST 0 - 40 IU/L 20 29 30   ALT 0 - 32 IU/L 11 17 16    CBC Latest Ref Rng & Units 07/29/2017 04/15/2017 12/11/2016  WBC 3.4 - 10.8 x10E3/uL 5.5 5.6 6.4  Hemoglobin 11.1 - 15.9 g/dL 13.0 12.4 12.8  Hematocrit 34.0 - 46.6 % 38.1 38.7 38.6  Platelets 150 - 379 x10E3/uL 228 234 263   Lipid Panel     Component Value Date/Time   CHOL 209 (H) 03/25/2019 1042   TRIG 140 03/25/2019 1042   HDL 49 03/25/2019 1042   CHOLHDL 4.3 03/25/2019 1042   CHOLHDL 3.7 12/22/2015 0940   VLDL 27 12/22/2015 0940   LDLCALC 135 (H) 03/25/2019 1042   HEMOGLOBIN A1C Lab Results  Component Value Date   HGBA1C 7.1 (H) 03/25/2019   MPG 120 12/22/2015   TSH No results for input(s): TSH in the last 8760 hours.  PRN Meds:. Medications Discontinued During This Encounter  Medication Reason  . pravastatin (PRAVACHOL) 20 MG tablet Discontinued by provider   Current Meds  Medication Sig  . aspirin 81 MG tablet Take 81 mg by mouth daily.  . Brimonidine Tartrate-Timolol (COMBIGAN OP) Apply to eye.  . cholecalciferol (VITAMIN D) 1000 units tablet Take 1,000 Units by mouth daily.  . clotrimazole (LOTRIMIN) 1 % cream Apply 1 application topically 2 (two) times daily. To skin between toes.  . Cyanocobalamin (VITAMIN B-12 CR PO) Take 500 mcg by mouth daily.   . fluticasone (FLONASE) 50 MCG/ACT nasal spray Place 1 spray into both nostrils daily. (Patient taking differently: Place 1 spray into both nostrils daily as needed. )  . fluticasone (FLOVENT HFA) 44 MCG/ACT inhaler Inhale 2 puffs into the lungs 2 (two) times daily. With spacer. (Patient taking differently: Inhale 2 puffs into  the lungs as needed. With spacer.)  . lisinopril-hydrochlorothiazide (ZESTORETIC) 10-12.5 MG tablet Take 1 tablet by mouth daily.  Marland Kitchen loratadine (CLARITIN) 10 MG tablet Take 1 tablet (10 mg total) by mouth daily.  . metFORMIN (GLUCOPHAGE) 500 MG tablet Take 1 tablet (500 mg total) by mouth daily with breakfast.  . metoprolol succinate  (TOPROL-XL) 25 MG 24 hr tablet Take 1 tablet (25 mg total) by mouth daily.  . tamoxifen (NOLVADEX) 10 MG tablet Take 1 tablet (10 mg total) by mouth daily.  . Turmeric Curcumin 500 MG CAPS Take by mouth.  . vitamin C (ASCORBIC ACID) 500 MG tablet Take 500 mg by mouth 2 (two) times daily.   . Zinc 50 MG TABS   . [DISCONTINUED] pravastatin (PRAVACHOL) 20 MG tablet Take 1 tablet (20 mg total) by mouth every other day.   Current Facility-Administered Medications for the 04/30/19 encounter (Office Visit) with Miquel Dunn, NP  Medication  . EPINEPHrine (Anaphylaxis) SOLN 0.3 mg    Cardiac Studies:   Event monitor-09/27/15-20-Nov-2015-sinus rhythm, heart rate ranging between 59-138/m. PACs. One run of PSVT seen on 09/29/2015 at 2.26 AM at average heart rate-150/m.   Lexiscan myoview stress test 09/09/2017: 1. Lexiscan stress test was performed. Exercise capacity was not assessed. Stress symptoms included dyspnea, dizziness. Peak blood pressure was 158/56 mmHg. The stress electrocardiogram showed sinus tachycardia, normal stress conduction, no stress arrhythmias and normal stress repolarization. Stress EKG is non diagnostic for ischemia as it is a pharmacologic stress. 2. The overall quality of the study is excellent. There is no evidence of abnormal lung activity. Stress and rest SPECT images demonstrate homogeneous tracer distribution throughout the myocardium. Gated SPECT imaging reveals normal myocardial thickening and wall motion. The left ventricular ejection fraction was normal (76%). 3. Low risk study.   Echocardiogram 09/17/2017: Left ventricle cavity is normal in size. Mild concentric hypertrophy of the left ventricle. Normal global wall motion. Doppler evidence of grade I (impaired) diastolic dysfunction, normal LAP. Calculated EF 56%. Right ventricle cavity is mildly dilated. Normal right ventricular function. Mild tricuspid regurgitation. Mild pulmonary hypertension. Estimated pulmonary  artery systolic pressure 32 mmHg IVC is dilated with respiratory variation. This may suggest elevated right heart pressure. Compared to the store on 11/23/25, mild RV dilatation and mild pulmonary hypertension new.  Lower Extremity Arterial Duplex 04/23/2019:  No hemodynamically significant stenoses are identified in the bilateral  lower extremity arterial system. Severely abnormal waveform at the right  PT suggests severe diffuse disease.  This exam reveals normal perfusion of the right and left lower extremity  (ABI 1.46). ABI may be falsely elevated in patients with DM.  Assessment:   PAD (peripheral artery disease) ([place])  Primary hypertension  Pure hypercholesterolemia  Morbid obesity ([place])  EKG 04/02/2019: Normal sinus rhythm at 84 bpm, normal axis, nonspecific T wave abnormality.  Recommendations:   I have discussed her recent lower extremity duplex results, likely has diffuse disease in her right leg which correlates with her symptoms. I have discussed starting medical therapy with Pletal; however, as her symptoms have improved with walking. We will continue with this and if she has worsening symptoms, will start Pletal at that time.   Her recent lipids with her PCP were not well controlled. She is on Pravastatin three times weekly, states she had shoulder pain with another previous statin. Does not appeared to have tried Crestor. She is willing to try. Will start Crestor 20 mg daily. If unable to tolerate, will consider addition of Nexlitol  or Zetia. She is seeing her PCP sometime in March, if she does not have lipids performed at that time, will order at her next office visit.   We have discussed ways to be more active and working towards walking for 30 minutes daily. Also making dietary changes to help with weight loss. I will see her back in 3 months for follow up or sooner if needed.  Miquel Dunn, MSN, APRN, FNP-C [facility]. [place] Office:  [phone] Fax: [phone]

## 2019-05-01 ENCOUNTER — Encounter: Payer: Self-pay | Admitting: Cardiology

## 2019-05-09 ENCOUNTER — Encounter: Payer: Self-pay | Admitting: Family Medicine

## 2019-05-12 NOTE — Progress Notes (Signed)
Patient Care Team: Wendie Agreste, MD as PCP - General (Family Medicine) [name], MD (Family Medicine)  DIAGNOSIS:    ICD-10-CM   1. Malignant neoplasm of central portion of left breast in female, estrogen receptor positive ([place])  C50.112    Z17.0     SUMMARY OF ONCOLOGIC HISTORY: Oncology History  Cancer of central portion of left female breast ([place])  08/13/2012 Mammogram   Questionable mass deep to the nipple superior periareolar region with calcifications.U/S 5 x 8 mm mass at 12:00, ovoid cystic lesion at 11:00 1.5X 0.4 cm   08/25/2012 Initial Diagnosis   Invasive mammary cancer with mammary cancer in situ grade 2 with lobular features ER 100%, PR 63%, Ki-67 58%, HER-2 positive ratio 2.48   08/26/2012 Breast MRI   Dominant, biopsied mass with small immediately adjacent satellite mass consistent with biopsy results indicating malignancy, measuring 17 x 11 x 62m   09/03/2012 Surgery   Left breast lumpectomy invasive lobular cancer with lymphovascular invasion and lobular carcinoma in situ positive margins one SLN negative, grade 2; 1.6 cm, ER 100%, PR 63%, HER-2 positive, Ki-67 58% T1 C. N0 M0 stage IA   10/21/2012 - 11/28/2012 Radiation Therapy   Radiation therapy to the lumpectomy site   12/01/2012 -  Anti-estrogen oral therapy   Arimidex 1 mg daily planned duration of treatment 5 years, switched to letrozole January 2016 for musculoskeletal aches and pains, switched to Tamoxifen 07/07/15     CHIEF COMPLIANT: Follow-up of left breast cancer on tamoxifen therapy  INTERVAL HISTORY: Diane KLAWITTERis a 74y.o. with above-mentioned history of left breast cancer treated with lumpectomy, radiation, and who is currently on tamoxifen. Mammogram on 12/29/18 showed no evidence of malignancy bilaterally. She presents to the clinic today for annual follow-up.   ALLERGIES:  is allergic to peanut (diagnostic); adhesive [tape]; latex; and lovastatin.  MEDICATIONS:  Current  Outpatient Medications  Medication Sig Dispense Refill  . aspirin 81 MG tablet Take 81 mg by mouth daily.    . Brimonidine Tartrate-Timolol (COMBIGAN OP) Apply to eye.    . cholecalciferol (VITAMIN D) 1000 units tablet Take 1,000 Units by mouth daily.    . clotrimazole (LOTRIMIN) 1 % cream Apply 1 application topically 2 (two) times daily. To skin between toes. 30 g 0  . Cyanocobalamin (VITAMIN B-12 CR PO) Take 500 mcg by mouth daily.     . fluticasone (FLONASE) 50 MCG/ACT nasal spray Place 1 spray into both nostrils daily. (Patient taking differently: Place 1 spray into both nostrils daily as needed. ) 16 g 5  . fluticasone (FLOVENT HFA) 44 MCG/ACT inhaler Inhale 2 puffs into the lungs 2 (two) times daily. With spacer. (Patient taking differently: Inhale 2 puffs into the lungs as needed. With spacer.) 1 Inhaler 5  . lisinopril-hydrochlorothiazide (ZESTORETIC) 10-12.5 MG tablet Take 1 tablet by mouth daily. 90 tablet 1  . loratadine (CLARITIN) 10 MG tablet Take 1 tablet (10 mg total) by mouth daily.    . metFORMIN (GLUCOPHAGE) 500 MG tablet Take 1 tablet (500 mg total) by mouth daily with breakfast. 90 tablet 1  . metoprolol succinate (TOPROL-XL) 25 MG 24 hr tablet Take 1 tablet (25 mg total) by mouth daily. 90 tablet 3  . rosuvastatin (CRESTOR) 20 MG tablet Take 1 tablet (20 mg total) by mouth daily. 30 tablet 1  . tamoxifen (NOLVADEX) 10 MG tablet Take 1 tablet (10 mg total) by mouth daily. 30 tablet 0  . Turmeric Curcumin  500 MG CAPS Take by mouth.    . vitamin C (ASCORBIC ACID) 500 MG tablet Take 500 mg by mouth 2 (two) times daily.     . Zinc 50 MG TABS      Current Facility-Administered Medications  Medication Dose Route Frequency Provider Last Rate Last Admin  . EPINEPHrine (Anaphylaxis) SOLN 0.3 mg  0.3 mg Intramuscular Once Bobbitt, [place] Muta, MD        PHYSICAL EXAMINATION: ECOG PERFORMANCE STATUS: 1 - Symptomatic but completely ambulatory  Vitals:   05/13/19 0848  BP: (!)  130/58  Pulse: 80  Resp: 18  Temp: 98.5 F (36.9 C)  SpO2: 99%   Filed Weights   05/13/19 0848  Weight: 228 lb 3.2 oz (103.5 kg)    BREAST: No palpable masses or nodules in either right or left breasts. No palpable axillary supraclavicular or infraclavicular adenopathy no breast tenderness or nipple discharge. (exam performed in the presence of a chaperone)  LABORATORY DATA:  I have reviewed the data as listed CMP Latest Ref Rng & Units 03/25/2019 09/09/2018 01/29/2018  Glucose 65 - 99 mg/dL 167(H) 120(H) 145(H)  BUN 8 - 27 mg/dL 15 16 12   Creatinine 0.57 - 1.00 mg/dL 0.66 0.71 0.72  Sodium 134 - 144 mmol/L 139 140 138  Potassium 3.5 - 5.2 mmol/L 4.7 4.0 3.9  Chloride 96 - 106 mmol/L 100 100 98  CO2 20 - 29 mmol/L 23 24 23   Calcium 8.7 - 10.3 mg/dL 10.4(H) 10.0 9.5  Total Protein 6.0 - 8.5 g/dL 6.9 7.0 6.6  Total Bilirubin 0.0 - 1.2 mg/dL 0.5 0.5 0.6  Alkaline Phos 39 - 117 IU/L 100 69 76  AST 0 - 40 IU/L 20 29 30   ALT 0 - 32 IU/L 11 17 16     Lab Results  Component Value Date   WBC 5.5 07/29/2017   HGB 13.0 07/29/2017   HCT 38.1 07/29/2017   MCV 87 07/29/2017   PLT 228 07/29/2017   NEUTROABS 3.3 07/29/2017    ASSESSMENT & PLAN:  Cancer of central portion of left female breast Left breast invasive lobular cancerER/PR positive HER-2 negative (initial biopsy showed HER-2 positive but final pathology on the lumpectomy did not) status post left breast lumpectomy and radiation and currently on antiestrogen therapy with Arimidex started 12/01/2012 switched to tamoxifen in 2017.  Tamoxifentoxicities:Marked improvement in her ability to tolerate the medication. Denies any significant hot flashes. She does have muscle aches especially in the lower back but they're not as bad as before.  We discussed about the duration of antiestrogen therapy.  She will continue tamoxifen for another year and then we can stop it.  Qsymia  Breast cancer surveillance: 1. Breast  exam2/24/2021is benign 2. Mammogram 10/12/2020is benign breast density category B 3. Bone density June 2016: T score -1.4  Return to clinic in 1 year for follow-up   No orders of the defined types were placed in this encounter.  The patient has a good understanding of the overall plan. she agrees with it. she will call with any problems that may develop before the next visit here.  Total time spent: 20 mins including face to face time and time spent for planning, charting and coordination of care  Nicholas Lose, MD 05/13/2019  I, Cloyde Reams Dorshimer, am acting as scribe for Dr. Nicholas Lose.  I have reviewed the above documentation for accuracy and completeness, and I agree with the above.

## 2019-05-13 ENCOUNTER — Inpatient Hospital Stay: Payer: Medicare Other | Attending: Hematology and Oncology | Admitting: Hematology and Oncology

## 2019-05-13 ENCOUNTER — Other Ambulatory Visit: Payer: Self-pay

## 2019-05-13 DIAGNOSIS — Z923 Personal history of irradiation: Secondary | ICD-10-CM | POA: Insufficient documentation

## 2019-05-13 DIAGNOSIS — Z170 Estrogen receptor positive status [ER+]: Secondary | ICD-10-CM | POA: Diagnosis not present

## 2019-05-13 DIAGNOSIS — Z79810 Long term (current) use of selective estrogen receptor modulators (SERMs): Secondary | ICD-10-CM | POA: Insufficient documentation

## 2019-05-13 DIAGNOSIS — C50112 Malignant neoplasm of central portion of left female breast: Secondary | ICD-10-CM | POA: Diagnosis not present

## 2019-05-13 MED ORDER — TAMOXIFEN CITRATE 10 MG PO TABS: 10.0000 mg | ORAL_TABLET | Freq: Every day | ORAL | 3 refills | Status: DC

## 2019-05-13 NOTE — Assessment & Plan Note (Signed)
Left breast invasive lobular cancerER/PR positive HER-2 negative (initial biopsy showed HER-2 positive but final pathology on the lumpectomy did not) status post left breast lumpectomy and radiation and currently on antiestrogen therapy with Arimidex started 12/01/2012.  Tamoxifentoxicities:Marked improvement in her ability to tolerate the medication. Denies any significant hot flashes. She does have muscle aches especially in the lower back but they're not as bad as before.  Right humerus fracture: after fall. Much improved.  Breast cancer surveillance: 1. Breast exam2/24/2021is benign 2. Mammogram 10/12/2020is benign breast density category B 3. Bone density June 2016: T score -1.4  Return to clinic in 1 year for follow-up

## 2019-05-14 ENCOUNTER — Telehealth: Payer: Self-pay | Admitting: Hematology and Oncology

## 2019-05-14 NOTE — Telephone Encounter (Signed)
I left a message regarding schedule  

## 2019-05-22 ENCOUNTER — Other Ambulatory Visit: Payer: Self-pay | Admitting: Cardiology

## 2019-05-22 DIAGNOSIS — E7800 Pure hypercholesterolemia, unspecified: Secondary | ICD-10-CM

## 2019-05-22 NOTE — Progress Notes (Signed)
Lab orders placed for first week of April

## 2019-06-03 DIAGNOSIS — H35722 Serous detachment of retinal pigment epithelium, left eye: Secondary | ICD-10-CM | POA: Diagnosis not present

## 2019-06-03 DIAGNOSIS — H353132 Nonexudative age-related macular degeneration, bilateral, intermediate dry stage: Secondary | ICD-10-CM | POA: Diagnosis not present

## 2019-06-03 DIAGNOSIS — H353221 Exudative age-related macular degeneration, left eye, with active choroidal neovascularization: Secondary | ICD-10-CM | POA: Diagnosis not present

## 2019-06-19 ENCOUNTER — Encounter: Payer: Self-pay | Admitting: Neurology

## 2019-07-01 DIAGNOSIS — E7800 Pure hypercholesterolemia, unspecified: Secondary | ICD-10-CM | POA: Diagnosis not present

## 2019-07-02 LAB — COMPREHENSIVE METABOLIC PANEL
ALT: 15 IU/L (ref 0–32)
AST: 26 IU/L (ref 0–40)
Albumin/Globulin Ratio: 1.3 (ref 1.2–2.2)
Albumin: 3.8 g/dL (ref 3.7–4.7)
Alkaline Phosphatase: 100 IU/L (ref 39–117)
BUN/Creatinine Ratio: 15 (ref 12–28)
BUN: 12 mg/dL (ref 8–27)
Bilirubin Total: 0.5 mg/dL (ref 0.0–1.2)
CO2: 25 mmol/L (ref 20–29)
Calcium: 9.3 mg/dL (ref 8.7–10.3)
Chloride: 99 mmol/L (ref 96–106)
Creatinine, Ser: 0.81 mg/dL (ref 0.57–1.00)
GFR calc Af Amer: 83 mL/min/{1.73_m2} (ref >59)
GFR calc non Af Amer: 72 mL/min/{1.73_m2} (ref >59)
Globulin, Total: 2.9 g/dL (ref 1.5–4.5)
Glucose: 180 mg/dL — ABNORMAL HIGH (ref 65–99)
Potassium: 4.7 mmol/L (ref 3.5–5.2)
Sodium: 137 mmol/L (ref 134–144)
Total Protein: 6.7 g/dL (ref 6.0–8.5)

## 2019-07-02 LAB — LIPID PANEL
Chol/HDL Ratio: 3.4 ratio (ref 0.0–4.4)
Cholesterol, Total: 130 mg/dL (ref 100–199)
HDL: 38 mg/dL — ABNORMAL LOW (ref >39)
LDL Chol Calc (NIH): 66 mg/dL (ref 0–99)
Triglycerides: 152 mg/dL — ABNORMAL HIGH (ref 0–149)
VLDL Cholesterol Cal: 26 mg/dL (ref 5–40)

## 2019-07-06 ENCOUNTER — Other Ambulatory Visit: Payer: Self-pay

## 2019-07-06 ENCOUNTER — Ambulatory Visit (INDEPENDENT_AMBULATORY_CARE_PROVIDER_SITE_OTHER): Payer: Medicare Other | Admitting: Ophthalmology

## 2019-07-06 ENCOUNTER — Encounter (INDEPENDENT_AMBULATORY_CARE_PROVIDER_SITE_OTHER): Payer: Self-pay | Admitting: Ophthalmology

## 2019-07-06 DIAGNOSIS — H35362 Drusen (degenerative) of macula, left eye: Secondary | ICD-10-CM | POA: Insufficient documentation

## 2019-07-06 DIAGNOSIS — H35722 Serous detachment of retinal pigment epithelium, left eye: Secondary | ICD-10-CM | POA: Diagnosis not present

## 2019-07-06 DIAGNOSIS — H353132 Nonexudative age-related macular degeneration, bilateral, intermediate dry stage: Secondary | ICD-10-CM

## 2019-07-06 DIAGNOSIS — H353221 Exudative age-related macular degeneration, left eye, with active choroidal neovascularization: Secondary | ICD-10-CM

## 2019-07-06 MED ORDER — AFLIBERCEPT 2MG/0.05ML IZ SOLN FOR KALEIDOSCOPE
2.0000 mg | INTRAVITREAL | Status: AC | PRN
  Administered 2019-07-06: 2 mg via INTRAVITREAL

## 2019-07-06 NOTE — Assessment & Plan Note (Signed)
The nature of age--related macular degeneration was discussed with the patient as well as the distinction between dry and wet types. Checking an Amsler Grid daily with advice to return immediately should a distortion develop, was given to the patient. The patient 's smoking status now and in the past was determined and advice based on the AREDS study was provided regarding the consumption of antioxidant supplements. AREDS 2 vitamin formulation was recommended. Consumption of dark leafy vegetables and fresh fruits of various colors was recommended. Treatment modalities for wet macular degeneration particularly the use of intravitreal injections of anti-blood vessel growth factors was discussed with the patient. Avastin, Lucentis, and Eylea are the available options. On occasion, therapy includes the use of photodynamic therapy and thermal laser. Stressed to the patient do not rub eyes. All patient questions were answered. 

## 2019-07-06 NOTE — Assessment & Plan Note (Signed)

## 2019-07-08 ENCOUNTER — Encounter: Payer: Self-pay | Admitting: Neurology

## 2019-07-09 ENCOUNTER — Ambulatory Visit (INDEPENDENT_AMBULATORY_CARE_PROVIDER_SITE_OTHER): Payer: Medicare Other | Admitting: Neurology

## 2019-07-09 ENCOUNTER — Encounter: Payer: Self-pay | Admitting: Neurology

## 2019-07-09 ENCOUNTER — Other Ambulatory Visit: Payer: Self-pay

## 2019-07-09 VITALS — BP 128/72 | HR 74 | Temp 97.1°F | Ht 61.0 in | Wt 230.0 lb

## 2019-07-09 DIAGNOSIS — Z6841 Body Mass Index (BMI) 40.0 and over, adult: Secondary | ICD-10-CM

## 2019-07-09 DIAGNOSIS — Z9989 Dependence on other enabling machines and devices: Secondary | ICD-10-CM | POA: Diagnosis not present

## 2019-07-09 DIAGNOSIS — I10 Essential (primary) hypertension: Secondary | ICD-10-CM | POA: Diagnosis not present

## 2019-07-09 DIAGNOSIS — E1139 Type 2 diabetes mellitus with other diabetic ophthalmic complication: Secondary | ICD-10-CM | POA: Diagnosis not present

## 2019-07-09 DIAGNOSIS — C50112 Malignant neoplasm of central portion of left female breast: Secondary | ICD-10-CM

## 2019-07-09 DIAGNOSIS — H353220 Exudative age-related macular degeneration, left eye, stage unspecified: Secondary | ICD-10-CM | POA: Diagnosis not present

## 2019-07-09 DIAGNOSIS — Z170 Estrogen receptor positive status [ER+]: Secondary | ICD-10-CM

## 2019-07-09 DIAGNOSIS — H409 Unspecified glaucoma: Secondary | ICD-10-CM

## 2019-07-09 DIAGNOSIS — G4733 Obstructive sleep apnea (adult) (pediatric): Secondary | ICD-10-CM

## 2019-07-09 NOTE — Progress Notes (Signed)
PATIENT: [name] DOB: [birth year]  REASON FOR VISIT: follow up- obstructive sleep apnea on CPAP HISTORY FROM: patient  HISTORY OF PRESENT ILLNESS:    I am meeting with [name] on 07/09/19 at  9:30 AM EDT , Chief Complaint: she states things are going well. She is a beast cancer survivor, she needs to take estrogen blockers for a total of 7 years- that will be October 2021.  She has a new phone system , blocking [name], and may have trouble to get info from DME [name].  Patient states most recent machine is >5 yrs old. she uses that one at home and its a Respironics- [name]. We were  able [place] get [place] ( [place] CPAP [facility] [place].   Her newer machine is now also 75 years old and she needs a new one. She desperately wants to change DMEs. Last sleep study 09/2009 but she got a new dream-station philips machine without retesting in July 2016 , provided without prescription by Diane home patient.   Interval history , left sided Wet Macular Degeneration and right sided Glaucoma,  [name], [name]. OSA certainly can influence the course of her eye disease.       07-08-2018 RV CD  We discussed the possible new issue of a new CPAP by 7 -2021, this time through [facility].  I will follow q 12 month with compliance and Epworth score. The patient had 100% compliance for the last 30 days with an average user time of 8 hours and 18 minutes and 5 seconds.  Each day the machine was used over 4 hours.  The 95th percentile pressure was 10.6 cmH2O the average residual AHI was 4.3/h of sleep, the mean pressure was 8.5 cm of water this current machine has an a flex setting of 2 cm and minimum pressure of 6 and a maximum pressure of 12 cmH2O.  The ramp time is 15 minutes long.  The humidifier is set at 2 and the heated tube is set at 3.  There needs to be no change in the patient's settings however she  would much rather use [place] then using the current machine a dream station auto CPAP from July 2016.  I will take care of that the next machine will be a ResMed machine again if possible.  It would likely be another auto titration device but I want to be able to tell if she has central or obstructive apneas and to find more out why her AHI is above 3/h.  She does not have significant air leaks by the way.   05-09-2017, CD [name] is a 74 year old caucasian, married female, here seen in the presence of her husband. She is a breast cancer survivor. She uses nasal pillows. 2 years ago she got a new machine after her first machine broke. At the time she switched from [facility] to nasal.    I received and reviewed the patient CPAP download from her DME company. It appears the patient has used her CPAP nightly for the last 30 days. Each night she uses her machine greater than 4 hours. On average she uses her machine 8 hours and 38 minutes. Her average pressure is 9.6 cm of water. Her residual AHI is 3.9. This is an excellent control of apnea per compliance download. Epworth 4,  FSS 28. She reports her hands get numb, and  when in bed her feet will get numb- she had back surgery and had symptoms before ( left sided sciatica- PAIN) .   [name] is a 73 year old female with a history of obstructive sleep apnea on CPAP. She returns today for a compliance download. Unfortunately we are unable to obtain a wireless download. We will have her DME company fax [place] over a 30 day download. The patient states that she continues to use her CPAP nightly. Denies any trouble falling asleep. She continues to feel that the machine works well. She does note that sometimes if she sleeps on her side she will notice that the mask leaks slightly. She does change out her supplies regularly. She reports that she lost her eldest son on February 5. This was unexpected. She denies any new  neurological symptoms. Returns today for an evaluation.  HISTORY  11/02/15 [facility]): Ms. [name] is a 74 year old female with a history of obstructive sleep apnea on CPAP. She returns today for a 30 day compliance download.   Her download today on 11/02/2015 documents an 87% compliance with an average user time of 6 hours and 37 minutes on all days. The patient's residual AHI has increased to 4.4 which is still good but used to be a little bit lower area to she uses an auto CPAP with a 90's percent pressure of 9.6 cm water the window of pressures between 6 and 12 cm water. She has a very long ramp time of 30 minutes. She has noticed that she has a little bit more trouble to initiate sleep on her new machine and I think this may be related to the very long ramp time. I will shorten the REM time to 12 minutes.  HISTORY 10/27/13:   [name] is 74 year old female with history of obstructive sleep Apnea on CPAP. She returns today for a 90 day compliance download. She brought her machine with her today and her reports shows an AHI of 2.2 on auto-titration at 6-12 cm of water. She uses her machine for an average of 6 hours and 22 minutes a night, with 97%compliance. His Epworth score is 4 points was previously 3 . His fatigue severity score is 8 was previously 16. Patient reports that she gets about 6.5 hours of sleep a night. She goes to bed around 11:30 pm and arises between 6:30-7:30am. She denies having trouble falling asleep or staying a sleep. States that she normally does not have to get up at night to urinate. Overall patient feels that CPAP has improved his sleepiness and fatigue. Since the last visit the Patient was diagnosed with breast cancer in June 2014. She is on an estrogen blocker now which has caused her to gain weight. She states she is now cancer free.   REVIEW OF SYSTEMS: Out of a complete 14 system review of symptoms, the patient complains only of the following symptoms, and all other  reviewed systems are negative.  How likely are you to doze in the following situations: 0 = not likely, 1 = slight chance, 2 = moderate chance, 3 = high chance  Sitting and Reading? Watching Television? Sitting inactive in a public place (theater or meeting)? Lying down in the afternoon when circumstances permit? Sitting and talking to someone? Sitting quietly after lunch without alcohol? In a car, while stopped for a few minutes in traffic? As a passenger in a car for an hour without a break?  Total = 2/24   I can't sleep without CPAP ! I sleep well.      ALLERGIES: Allergies  Allergen Reactions  . Peanut (Diagnostic) Cough, Hypertension and Shortness Of Breath  . Adhesive [Tape] Rash  . Latex Itching and Other (See Comments)    Causes blisters  . Lovastatin Other (See Comments)    Arthralgias, hyperglycemia with lovastatin.    HOME MEDICATIONS: Outpatient Medications Prior to Visit  Medication Sig Dispense Refill  . aspirin 81 MG tablet Take 81 mg by mouth daily.    . Brimonidine Tartrate-Timolol (COMBIGAN OP) Apply to eye.    . cholecalciferol (VITAMIN D) 1000 units tablet Take 1,000 Units by mouth daily.    . clotrimazole (LOTRIMIN) 1 % cream Apply 1 application topically 2 (two) times daily. To skin between toes. 30 g 0  . Cyanocobalamin (VITAMIN B-12 CR PO) Take 500 mcg by mouth daily.     . fluticasone (FLONASE) 50 MCG/ACT nasal spray Place 1 spray into both nostrils daily. (Patient taking differently: Place 1 spray into both nostrils daily as needed. ) 16 g 5  . fluticasone (FLOVENT HFA) 44 MCG/ACT inhaler Inhale 2 puffs into the lungs 2 (two) times daily. With spacer. (Patient taking differently: Inhale 2 puffs into the lungs as needed. With spacer.) 1 Inhaler 5  . lisinopril-hydrochlorothiazide (ZESTORETIC) 10-12.5 MG tablet Take 1 tablet by mouth daily. 90 tablet 1  . loratadine (CLARITIN) 10 MG tablet Take 1 tablet (10 mg total) by mouth daily.    . metFORMIN  (GLUCOPHAGE) 500 MG tablet Take 1 tablet (500 mg total) by mouth daily with breakfast. 90 tablet 1  . metoprolol succinate (TOPROL-XL) 25 MG 24 hr tablet Take 1 tablet (25 mg total) by mouth daily. 90 tablet 3  . rosuvastatin (CRESTOR) 20 MG tablet Take 1 tablet (20 mg total) by mouth daily. 30 tablet 1  . tamoxifen (NOLVADEX) 10 MG tablet Take 1 tablet (10 mg total) by mouth daily. 90 tablet 3  . Turmeric Curcumin 500 MG CAPS Take by mouth.    . vitamin C (ASCORBIC ACID) 500 MG tablet Take 500 mg by mouth 2 (two) times daily.     . Zinc 50 MG TABS      Facility-Administered Medications Prior to Visit  Medication Dose Route Frequency Provider Last Rate Last Admin  . EPINEPHrine (Anaphylaxis) SOLN 0.3 mg  0.3 mg Intramuscular Once Bobbitt, Sedalia Muta, MD        PAST MEDICAL HISTORY: Past Medical History:  Diagnosis Date  . Allergic rhinitis 03/23/2013  . Allergy   . Anemia    due to vaginal bleeding  . Arthritis    hands  . Asthmatic bronchitis 09/03/2017  . Breast cancer ([place]) 08/2012   left  . Dental crowns present   . Eczema   . Fibroids   . Headache(784.0)    tension  . History of endometriosis   . Hyperlipidemia   . Hypertension    under control with med., has been on med. x 3 yr.  . OSA (obstructive sleep apnea)   . Postmenopausal bleeding   . Recurrent upper respiratory infection (URI)   . S/P radiation therapy 10/20/2012-12/05/2012   left breast 50.4 gray, lumpectomy cavity boosted to 62.4 gray  . Seasonal allergies   . Sleep apnea    uses CPAP nightly  . Urinary urgency   . White coat hypertension     PAST SURGICAL HISTORY: Past Surgical History:  Procedure Laterality Date  .  BACK SURGERY  1997   lumbar  . BREAST LUMPECTOMY WITH NEEDLE LOCALIZATION AND AXILLARY SENTINEL LYMPH NODE BX Left 09/03/2012   Procedure: BREAST LUMPECTOMY WITH NEEDLE LOCALIZATION AND AXILLARY SENTINEL LYMPH NODE BX;  Surgeon: Edward Jolly, MD;  Location: [facility];   Service: General;  Laterality: Left;  . CHOLECYSTECTOMY  1995  . COLONOSCOPY W/ POLYPECTOMY  03/20/2007  . DILATION AND CURETTAGE OF UTERUS  2005  . DILATION AND CURETTAGE OF UTERUS  06/22/2011   Procedure: DILATATION AND CURETTAGE;  Surgeon: Alvino Chapel, MD;  Location: [facility];  Service: Gynecology;  Laterality: N/A;  OK PER KEELA FOR 7:15 START  . DORSAL COMPARTMENT RELEASE Right 05/19/2013   Procedure: RELEASE 1ST  DORSAL COMPARTMENT RIGHT (DEQUERVAIN);  Surgeon: Cammie Sickle., MD;  Location: [facility];  Service: Orthopedics;  Laterality: Right;  . EXPLORATORY LAPAROTOMY  06-30-2007   ATTEMPTED HYSTERECTOMY ABORTED DUE TO EXTENSIVE ENDOMETRIOSIS W/ DENSE PELVIC ADHESIONS INVOLVING UTERUS AND LOWER RECTOSIGMOID  . HYSTEROSCOPY WITH D & C  05/06/2009    FAMILY HISTORY: Family History  Problem Relation Age of Onset  . Vision loss Mother   . Hypertension Mother   . Anesthesia problems Mother        post-op N/V  . Heart disease Mother        carotid stenosis s/p CAE  . Osteoporosis Mother   . Eczema Mother   . Depression Father   . Heart disease Sister 18       arrhythmia  . Hypertension Sister   . Hyperlipidemia Sister   . Diabetes Brother   . Hypertension Brother   . Cancer Brother        skin  . Hyperlipidemia Brother   . Stroke Brother   . COPD Daughter   . Atopy Daughter   . Cancer Cousin   . Kidney failure Maternal Grandmother   . Heart disease Maternal Grandfather   . Hypertension Other   . Colon cancer Neg Hx   . Allergic rhinitis Neg Hx   . Asthma Neg Hx   . Urticaria Neg Hx     SOCIAL HISTORY: Social History   Socioeconomic History  . Marital status: Married    Spouse name: [name]  . Number of children: 4  . Years of education: College  . Highest education level: Not on file  Occupational History  . Occupation: retired  Tobacco Use  . Smoking status: Never Smoker  . Smokeless tobacco: Never Used    Substance and Sexual Activity  . Alcohol use: No    Alcohol/week: 0.0 standard drinks  . Drug use: No  . Sexual activity: Yes    Comment: menarche 6, P4, Premarin for short while, Provera x 3 years  Other Topics Concern  . Not on file  Social History Narrative   Marital status: married x 2  years; happily married; no abuse.      Children: 4 children (2 sons living; 1 son died in 1854 at age 74years old, 1 daughter); six grandchildren; no gg.      Lives:  With husband.      Employed: retired in 2011; [employer].      Tobacco: none       Alcohol:  None      Drugs: none      Exercise:  Rowing 3 days per week.      Seatbelt:  100%      Sunscreen:  Face SPF 15.      Guns:  Loaded mostly secured.      ADLs:  Drives minimally; no assistant devices; independent with all ADLs.       Advanced Directives: YES; FULL CODE but no prolonged measures.      Her nocturia is improved under CPAP use at 10 cm water- AHI is now 0.8 and from 30 at baseline. CMS compliance  6 hours 40 minutes - sleeps on the side, 08-10-11 .FFM - likes it.    . Flonase used prn.     Caffeine Use: 1 cup daily   Social Determinants of Health   Financial Resource Strain:   . Difficulty of Paying Living Expenses:   Food Insecurity:   . Worried About Charity fundraiser in the Last Year:   . Arboriculturist in the Last Year:   Transportation Needs:   . Film/video editor (Medical):   Marland Kitchen Lack of Transportation (Non-Medical):   Physical Activity:   . Days of Exercise per Week:   . Minutes of Exercise per Session:   Stress:   . Feeling of Stress :   Social Connections:   . Frequency of Communication with Friends and Family:   . Frequency of Social Gatherings with Friends and Family:   . Attends Religious Services:   . Active Member of Clubs or Organizations:   . Attends Archivist Meetings:   Marland Kitchen Marital Status:   Intimate Partner Violence:   . Fear of Current or Ex-Partner:   .  Emotionally Abused:   Marland Kitchen Physically Abused:   . Sexually Abused:    Peanut allergy  Vitals:   07/09/19 0916  BP: 128/72  Pulse: 74  Temp: (!) 97.1 F (36.2 C)  Weight: 230 lb (104.3 kg)  Height: 5\' 1"  (1.549 m)   Body mass index is 43.46 kg/m.    Neck Circumference : 17 inches, Mallampati 2-   Generalized: Well developed, in no acute distress, well groomed.   Neurological examination  Mentation: Alert oriented to time, place, history taking.  Follows all commands speech and language fluent Cranial nerve:  Taste and smell have recovered- impaired by chemotherapy. . Pupils are equally round and responsive.  Extraocular movements were fully intact.  Uvula and tongue move in midline.  Head turning and shoulder shrug  were normal and symmetric.  Motor: restricted  ROM in all proximal 4 extremities. Pain, myalgia -  Symmetric grip, no tremors, . Coordination:  finger-nose bilaterally intact, no tremor and no ataxia.   Gait and station: Gait is wide based.  Balance problems when getting up in AM.   DIAGNOSTIC DATA (LABS, IMAGING, TESTING) - I reviewed patient records, labs, notes, testing and imaging myself where available.  CPAP download:  She has a 75 year old machine, like a bedside radio. S 10 machine by [employer]. I could not get any report - we now wait ( as every year !) for a faxed report from [facility]. DME change planned with next CPAP.   S2 her older machine we could get the last 10 days of use his machine is located as happy child. Is an S9 AutoSet with a maximum pressure of 12 minimum pressure of 6 cmH2O with an EPR level of 2 cm average usage today is 7 hours and 50 minutes, and residual AHI is 2.4. 95th percentile leak is high which may be related to the age of the mask, the pressures at 95th percentile are 11.6.  I would think that the patient could benefit from an increase in pressure by 1 or 2 cm anyway but I will order a home sleep test to get her new baseline. If Medicare  insist on an in lab study I will change the order accordingly.      Lab Results  Component Value Date   CHOL 130 07/01/2019   HDL 38 (L) 07/01/2019   LDLCALC 66 07/01/2019   TRIG 152 (H) 07/01/2019   CHOLHDL 3.4 07/01/2019   Lab Results  Component Value Date   HGBA1C 7.1 (H) 03/25/2019    Lab Results  Component Value Date   TSH 1.570 12/11/2016     I discussed the assessment and treatment plan with the patient. The patient was provided an opportunity to ask questions and all were answered. The patient agreed with the plan and demonstrated an understanding of the instructions.    I provided 28 minutes of face-to-face time during this encounter.   Larey Seat, MD  1. Obstructive sleep apnea on CPAP. Machine is not longer functioning well, albeit the patient has h always ad a high compliance.   2.  Main risk factor - obesity.  3Left Breast cancer survivor, status post radiation.   Please download new CPAP compliance data from [facility] in [place] before next years visit. Marland Kitchen  Next year new sleep study-  establish new baseline AHI , new machine in July 2021.    Larey Seat, MD  07/09/2019, 9:53 AM [facility] [address] [place], [place] [address] [phone]

## 2019-07-09 NOTE — Patient Instructions (Signed)
We will obatin a new baseline by HST or PSG to order your next sleep machine , it will certainly be an autotitration device and pressure should exceed 13 cm water.    CPAP and BPAP Information CPAP and BPAP are methods of helping a person breathe with the use of air pressure. CPAP stands for "continuous positive airway pressure." BPAP stands for "bi-level positive airway pressure." In both methods, air is blown through your nose or mouth and into your air passages to help you breathe well. CPAP and BPAP use different amounts of pressure to blow air. With CPAP, the amount of pressure stays the same while you breathe in and out. With BPAP, the amount of pressure is increased when you breathe in (inhale) so that you can take larger breaths. Your health care provider will recommend whether CPAP or BPAP would be more helpful for you. Why are CPAP and BPAP treatments used? CPAP or BPAP can be helpful if you have:  Sleep apnea.  Chronic obstructive pulmonary disease (COPD).  Heart failure.  Medical conditions that weaken the muscles of the chest including muscular dystrophy, or neurological diseases such as amyotrophic lateral sclerosis (ALS).  Other problems that cause breathing to be weak, abnormal, or difficult. CPAP is most commonly used for obstructive sleep apnea (OSA) to keep the airways from collapsing when the muscles relax during sleep. How is CPAP or BPAP administered? Both CPAP and BPAP are provided by a small machine with a flexible plastic tube that attaches to a plastic mask. You wear the mask. Air is blown through the mask into your nose or mouth. The amount of pressure that is used to blow the air can be adjusted on the machine. Your health care provider will determine the pressure setting that should be used based on your individual needs. When should CPAP or BPAP be used? In most cases, the mask only needs to be worn during sleep. Generally, the mask needs to be worn throughout the  night and during any daytime naps. People with certain medical conditions may also need to wear the mask at other times when they are awake. Follow instructions from your health care provider about when to use the machine. What are some tips for using the mask?   Because the mask needs to be snug, some people feel trapped or closed-in (claustrophobic) when first using the mask. If you feel this way, you may need to get used to the mask. One way to do this is by holding the mask loosely over your nose or mouth and then gradually applying the mask more snugly. You can also gradually increase the amount of time that you use the mask.  Masks are available in various types and sizes. Some fit over your mouth and nose while others fit over just your nose. If your mask does not fit well, talk with your health care provider about getting a different one.  If you are using a mask that fits over your nose and you tend to breathe through your mouth, a chin strap may be applied to help keep your mouth closed.  The CPAP and BPAP machines have alarms that may sound if the mask comes off or develops a leak.  If you have trouble with the mask, it is very important that you talk with your health care provider about finding a way to make the mask easier to tolerate. Do not stop using the mask. Stopping the use of the mask could have a negative  impact on your health. What are some tips for using the machine?  Place your CPAP or BPAP machine on a secure table or stand near an electrical outlet.  Know where the on/off switch is located on the machine.  Follow instructions from your health care provider about how to set the pressure on your machine and when you should use it.  Do not eat or drink while the CPAP or BPAP machine is on. Food or fluids could get pushed into your lungs by the pressure of the CPAP or BPAP.  Do not smoke. Tobacco smoke residue can damage the machine.  For home use, CPAP and BPAP  machines can be rented or purchased through home health care companies. Many different brands of machines are available. Renting a machine before purchasing may help you find out which particular machine works well for you.  Keep the CPAP or BPAP machine and attachments clean. Ask your health care provider for specific instructions. Get help right away if:  You have redness or open areas around your nose or mouth where the mask fits.  You have trouble using the CPAP or BPAP machine.  You cannot tolerate wearing the CPAP or BPAP mask.  You have pain, discomfort, and bloating in your abdomen. Summary  CPAP and BPAP are methods of helping a person breathe with the use of air pressure.  Both CPAP and BPAP are provided by a small machine with a flexible plastic tube that attaches to a plastic mask.  If you have trouble with the mask, it is very important that you talk with your health care provider about finding a way to make the mask easier to tolerate. This information is not intended to replace advice given to you by your health care provider. Make sure you discuss any questions you have with your health care provider. Document Revised: 06/25/2018 Document Reviewed: 01/23/2016 Elsevier Patient Education  [place].

## 2019-07-16 ENCOUNTER — Telehealth: Payer: Self-pay

## 2019-07-16 NOTE — Telephone Encounter (Signed)
LVM for pt to call me back to schedule sleep study  

## 2019-07-21 DIAGNOSIS — H2513 Age-related nuclear cataract, bilateral: Secondary | ICD-10-CM | POA: Diagnosis not present

## 2019-07-21 DIAGNOSIS — H0102B Squamous blepharitis left eye, upper and lower eyelids: Secondary | ICD-10-CM | POA: Diagnosis not present

## 2019-07-21 DIAGNOSIS — H401111 Primary open-angle glaucoma, right eye, mild stage: Secondary | ICD-10-CM | POA: Diagnosis not present

## 2019-07-21 DIAGNOSIS — H353221 Exudative age-related macular degeneration, left eye, with active choroidal neovascularization: Secondary | ICD-10-CM | POA: Diagnosis not present

## 2019-07-21 DIAGNOSIS — H40012 Open angle with borderline findings, low risk, left eye: Secondary | ICD-10-CM | POA: Diagnosis not present

## 2019-07-21 DIAGNOSIS — H0102A Squamous blepharitis right eye, upper and lower eyelids: Secondary | ICD-10-CM | POA: Diagnosis not present

## 2019-07-21 DIAGNOSIS — E119 Type 2 diabetes mellitus without complications: Secondary | ICD-10-CM | POA: Diagnosis not present

## 2019-07-22 ENCOUNTER — Other Ambulatory Visit: Payer: Self-pay

## 2019-07-22 ENCOUNTER — Ambulatory Visit (INDEPENDENT_AMBULATORY_CARE_PROVIDER_SITE_OTHER): Payer: Medicare Other | Admitting: Neurology

## 2019-07-22 ENCOUNTER — Encounter: Payer: Self-pay | Admitting: Family Medicine

## 2019-07-22 ENCOUNTER — Ambulatory Visit (INDEPENDENT_AMBULATORY_CARE_PROVIDER_SITE_OTHER): Payer: Medicare Other | Admitting: Family Medicine

## 2019-07-22 ENCOUNTER — Ambulatory Visit (INDEPENDENT_AMBULATORY_CARE_PROVIDER_SITE_OTHER): Payer: Medicare Other

## 2019-07-22 VITALS — BP 133/80 | HR 89 | Temp 98.3°F | Ht 61.0 in | Wt 232.0 lb

## 2019-07-22 DIAGNOSIS — G4733 Obstructive sleep apnea (adult) (pediatric): Secondary | ICD-10-CM | POA: Diagnosis not present

## 2019-07-22 DIAGNOSIS — R600 Localized edema: Secondary | ICD-10-CM

## 2019-07-22 DIAGNOSIS — E119 Type 2 diabetes mellitus without complications: Secondary | ICD-10-CM | POA: Diagnosis not present

## 2019-07-22 DIAGNOSIS — H938X2 Other specified disorders of left ear: Secondary | ICD-10-CM

## 2019-07-22 DIAGNOSIS — I10 Essential (primary) hypertension: Secondary | ICD-10-CM

## 2019-07-22 DIAGNOSIS — Z6841 Body Mass Index (BMI) 40.0 and over, adult: Secondary | ICD-10-CM

## 2019-07-22 DIAGNOSIS — E1139 Type 2 diabetes mellitus with other diabetic ophthalmic complication: Secondary | ICD-10-CM

## 2019-07-22 DIAGNOSIS — Z170 Estrogen receptor positive status [ER+]: Secondary | ICD-10-CM

## 2019-07-22 DIAGNOSIS — M25561 Pain in right knee: Secondary | ICD-10-CM

## 2019-07-22 DIAGNOSIS — Z9989 Dependence on other enabling machines and devices: Secondary | ICD-10-CM

## 2019-07-22 DIAGNOSIS — H6122 Impacted cerumen, left ear: Secondary | ICD-10-CM

## 2019-07-22 MED ORDER — NEOMYCIN-POLYMYXIN-HC 3.5-10000-1 OT SOLN: 3.0000 [drp] | Freq: Three times a day (TID) | OTIC | 0 refills | Status: DC

## 2019-07-22 NOTE — Patient Instructions (Addendum)
Increase walking for exercise  - this should help with weight loss and leg swelling possibly as well.  Talk to Dr. Einar Gip about leg swelling, compression stockings and pletal if needed for arterial disease of legs.   I will check hemoglobin A1c and let you  know if any diabetes med changes needed.  I do not see any significant break in the bone of the kneecap, but will let you know if there are concerns from the report.  I expect that area to continue to improve.  See information below.  Follow-up if worsening.  Ear looks better.  If any pain or irritation, start antibiotic drops.  If hearing does not continue to improve through the weekend or any worsening symptoms, return for recheck as we discussed.   Contusion A contusion is a deep bruise. Contusions are the result of a blunt injury to tissues and muscle fibers under the skin. The injury causes bleeding under the skin. The skin overlying the contusion may turn blue, purple, or yellow. Minor injuries will give you a painless contusion, but more severe injuries cause contusions that may stay painful and swollen for a few weeks. Follow these instructions at home: Pay attention to any changes in your symptoms. Let your health care provider know about them. Take these actions to relieve your pain. Managing pain, stiffness, and swelling   Use resting, icing, applying pressure (compression), and raising (elevating) the injured area. This is often called the RICE strategy. ? Rest the injured area. Return to your normal activities as told by your health care provider. Ask your health care provider what activities are safe for you. ? If directed, put ice on the injured area:  Put ice in a plastic bag.  Place a towel between your skin and the bag.  Leave the ice on for 20 minutes, 2-3 times per day. ? If directed, apply light compression to the injured area using an elastic bandage. Make sure the bandage is not wrapped too tightly. Remove and  reapply the bandage as directed by your health care provider. ? If possible, raise (elevate) the injured area above the level of your heart while you are sitting or lying down. General instructions  Take over-the-counter and prescription medicines only as told by your health care provider.  Keep all follow-up visits as told by your health care provider. This is important. Contact a health care provider if:  Your symptoms do not improve after several days of treatment.  Your symptoms get worse.  You have difficulty moving the injured area. Get help right away if:  You have severe pain.  You have numbness in a hand or foot.  Your hand or foot turns pale or cold. Summary  A contusion is a deep bruise.  Contusions are the result of a blunt injury to tissues and muscle fibers under the skin.  It is treated with rest, ice, compression, and elevation. You may be given over-the-counter medicines for pain.  Contact a health care provider if your symptoms do not improve, or get worse.  Get help right away if you have severe pain, have numbness, or the area turns pale or cold. This information is not intended to replace advice given to you by your health care provider. Make sure you discuss any questions you have with your health care provider. Document Revised: 10/24/2017 Document Reviewed: 10/24/2017 Elsevier Patient Education  [place], Adult The ears produce a substance called earwax that helps keep bacteria out  of the ear and protects the skin in the ear canal. Occasionally, earwax can build up in the ear and cause discomfort or hearing loss. What increases the risk? This condition is more likely to develop in people who:  Are female.  Are elderly.  Naturally produce more earwax.  Clean their ears often with cotton swabs.  Use earplugs often.  Use in-ear headphones often.  Wear hearing aids.  Have narrow ear canals.  Have earwax that is  overly thick or sticky.  Have eczema.  Are dehydrated.  Have excess hair in the ear canal. What are the signs or symptoms? Symptoms of this condition include:  Reduced or muffled hearing.  A feeling of fullness in the ear or feeling that the ear is plugged.  Fluid coming from the ear.  Ear pain.  Ear itch.  Ringing in the ear.  Coughing.  An obvious piece of earwax that can be seen inside the ear canal. How is this diagnosed? This condition may be diagnosed based on:  Your symptoms.  Your medical history.  An ear exam. During the exam, your health care provider will look into your ear with an instrument called an otoscope. You may have tests, including a hearing test. How is this treated? This condition may be treated by:  Using ear drops to soften the earwax.  Having the earwax removed by a health care provider. The health care provider may: ? Flush the ear with water. ? Use an instrument that has a loop on the end (curette). ? Use a suction device.  Surgery to remove the wax buildup. This may be done in severe cases. Follow these instructions at home:   Take over-the-counter and prescription medicines only as told by your health care provider.  Do not put any objects, including cotton swabs, into your ear. You can clean the opening of your ear canal with a washcloth or facial tissue.  Follow instructions from your health care provider about cleaning your ears. Do not over-clean your ears.  Drink enough fluid to keep your urine clear or pale yellow. This will help to thin the earwax.  Keep all follow-up visits as told by your health care provider. If earwax builds up in your ears often or if you use hearing aids, consider seeing your health care provider for routine, preventive ear cleanings. Ask your health care provider how often you should schedule your cleanings.  If you have hearing aids, clean them according to instructions from the manufacturer and  your health care provider. Contact a health care provider if:  You have ear pain.  You develop a fever.  You have blood, pus, or other fluid coming from your ear.  You have hearing loss.  You have ringing in your ears that does not go away.  Your symptoms do not improve with treatment.  You feel like the room is spinning (vertigo). Summary  Earwax can build up in the ear and cause discomfort or hearing loss.  The most common symptoms of this condition include reduced or muffled hearing and a feeling of fullness in the ear or feeling that the ear is plugged.  This condition may be diagnosed based on your symptoms, your medical history, and an ear exam.  This condition may be treated by using ear drops to soften the earwax or by having the earwax removed by a health care provider.  Do not put any objects, including cotton swabs, into your ear. You can clean the opening of your  ear canal with a washcloth or facial tissue. This information is not intended to replace advice given to you by your health care provider. Make sure you discuss any questions you have with your health care provider. Document Revised: 02/15/2017 Document Reviewed: 05/16/2016 Elsevier Patient Education  [employer].    If you have lab work done today you will be contacted with your lab results within the next 2 weeks.  If you have not heard from [place] then please contact us. The fastest way to get your results is to register for My Chart.   IF you received an x-ray today, you will receive an invoice from [facility]. Please contact [facility] at [phone] with questions or concerns regarding your invoice.   IF you received labwork today, you will receive an invoice from [facility]. Please contact [facility] at [phone] with questions or concerns regarding your invoice.   Our billing staff will not be able to assist you with questions regarding bills from these companies.  You will  be contacted with the lab results as soon as they are available. The fastest way to get your results is to activate your My Chart account. Instructions are located on the last page of this paperwork. If you have not heard from [place] regarding the results in 2 weeks, please contact this office.

## 2019-07-22 NOTE — Progress Notes (Signed)
Subjective:  Patient ID: [name], female    DOB: October 27, 1945  Age: 74 y.o. MRN: [id number]  CC:  Chief Complaint  Patient presents with  . Follow-up    on type 2 diabetes, hypertension, and pedal edemia. pt reports no issues with her diabetes. pt checks her BS daily when she has test strips. pt states her BP has been well undercontrol. pt reports some vertigo like symptoms, but states her L ear has been 'stuffy' and thats why ther vertigo like symptoms. pt states she is still having swelling in her feet no pain at this time.    HPI [name] presents for   Diabetes: With obesity Metformin 500mg  qd. Tolerating ok without diarrhea.  On ACE-I, statin. Started on crestor - some joint aches. Back on pravastatin every other day. appt with cardiology Dr. Einar Gip next week.  Fasting 132 this am.  No symptomatic lows Microalbumin: nl ratio 03/25/19.  Has not restarted exercise, no change in diet since last visit.  Has used walking track at [facility] in the past.  Optho, foot exam, pneumovax: up to date.   Lab Results  Component Value Date   HGBA1C 7.1 (H) 03/25/2019   HGBA1C 6.9 (H) 09/09/2018   HGBA1C 7.2 (A) 01/29/2018   Lab Results  Component Value Date   MICROALBUR 0.2 12/06/2015   LDLCALC 66 07/01/2019   CREATININE 0.81 07/01/2019   Vertigo/L ear stuffiness:  at beach last week. Discomfort for a few days. Popped few days ago - less sore. Stuffy in the morning. No fever. Slight decreased hearing out of left ear.  No treatments.  Min dizziness with congestion in ear, better once ear popped. Still feels blocked.    Fall: Slipped on water last week at grocery store - DOI 07/16/19. Fell onto R knee.  Able to walk. Still sore to touch on front of R knee No bruising/bleeding. Stretched achilles? Sore in the morning. Better during day. No weakness.   Hypertension: With PAD, chronic edema. pletal as option, but not on at this time. Appt with cardiology next week.   Wears compression stockings at times. Sometimes slides down.  Home readings: 110/60 BP Readings from Last 3 Encounters:  07/22/19 133/80  07/09/19 128/72  05/13/19 (!) 130/58   Lab Results  Component Value Date   CREATININE 0.81 07/01/2019      History Patient Active Problem List   Diagnosis Date Noted  . Class 3 severe obesity due to excess calories with serious comorbidity and body mass index (BMI) of 40.0 to 44.9 in adult [facility]) 07/09/2019  . OSA on CPAP 07/09/2019  . Glaucoma of right eye 07/09/2019  . Exudative age-related macular degeneration of left eye ([place]) 07/09/2019  . Exudative age-related macular degeneration of left eye with active choroidal neovascularization ([place]) 07/06/2019  . Serous detachment of retinal pigment epithelium of left eye 07/06/2019  . Intermediate stage nonexudative age-related macular degeneration of both eyes 07/06/2019  . Degenerative retinal drusen of left eye 07/06/2019  . Macular degeneration 03/25/2019  . Glaucoma 03/25/2019  . History of food allergy 10/22/2017  . Anaphylaxis 10/22/2017  . Food allergy 09/03/2017  . Mild persistent asthma 09/03/2017  . Spinal stenosis of lumbar region with neurogenic claudication 08/01/2016  . Pure hypercholesterolemia 08/01/2016  . Osteopenia 10/18/2014  . Obesity (BMI 30-39.9) 06/22/2014  . Perennial allergic rhinitis 03/23/2013  . Cancer of central portion of left female breast ([place]) 08/25/2012  . Obstructive sleep apnea on CPAP 04/21/2012  .  Diabetes ([place]) 12/28/2011  . HTN (hypertension) 12/28/2011  . Uterine bleeding 04/30/2011   Past Medical History:  Diagnosis Date  . Allergic rhinitis 03/23/2013  . Allergy   . Anemia    due to vaginal bleeding  . Arthritis    hands  . Asthmatic bronchitis 09/03/2017  . Breast cancer ([place]) 08/2012   left  . Dental crowns present   . Eczema   . Fibroids   . Headache(784.0)    tension  . History of endometriosis   . Hyperlipidemia   . Hypertension     under control with med., has been on med. x 3 yr.  . OSA (obstructive sleep apnea)   . Postmenopausal bleeding   . Recurrent upper respiratory infection (URI)   . S/P radiation therapy 10/20/2012-12/05/2012   left breast 50.4 gray, lumpectomy cavity boosted to 62.4 gray  . Seasonal allergies   . Sleep apnea    uses CPAP nightly  . Urinary urgency   . White coat hypertension    Past Surgical History:  Procedure Laterality Date  . BACK SURGERY  1997   lumbar  . BREAST LUMPECTOMY WITH NEEDLE LOCALIZATION AND AXILLARY SENTINEL LYMPH NODE BX Left 09/03/2012   Procedure: BREAST LUMPECTOMY WITH NEEDLE LOCALIZATION AND AXILLARY SENTINEL LYMPH NODE BX;  Surgeon: Edward Jolly, MD;  Location: [place];  Service: General;  Laterality: Left;  . CHOLECYSTECTOMY  1995  . COLONOSCOPY W/ POLYPECTOMY  03/20/2007  . DILATION AND CURETTAGE OF UTERUS  2005  . DILATION AND CURETTAGE OF UTERUS  06/22/2011   Procedure: DILATATION AND CURETTAGE;  Surgeon: Alvino Chapel, MD;  Location[facility];  Service: Gynecology;  Laterality: N/A;  OK PER KEELA FOR 7:15 START  . DORSAL COMPARTMENT RELEASE Right 05/19/2013   Procedure: RELEASE 1ST  DORSAL COMPARTMENT RIGHT (DEQUERVAIN);  Surgeon: Cammie Sickle., MD;  Location: [facility];  Service: Orthopedics;  Laterality: Right;  . EXPLORATORY LAPAROTOMY  06-30-2007   ATTEMPTED HYSTERECTOMY ABORTED DUE TO EXTENSIVE ENDOMETRIOSIS W/ DENSE PELVIC ADHESIONS INVOLVING UTERUS AND LOWER RECTOSIGMOID  . HYSTEROSCOPY WITH D & C  05/06/2009   Allergies  Allergen Reactions  . Peanut (Diagnostic) Cough, Hypertension and Shortness Of Breath  . Adhesive [Tape] Rash  . Latex Itching and Other (See Comments)    Causes blisters  . Lovastatin Other (See Comments)    Arthralgias, hyperglycemia with lovastatin.   Prior to Admission medications   Medication Sig Start Date End Date Taking? Authorizing Provider  aspirin 81  MG tablet Take 81 mg by mouth daily.   Yes [provider]  Brimonidine Tartrate-Timolol (COMBIGAN OP) Apply to eye.   Yes [provider]  cholecalciferol (VITAMIN D) 1000 units tablet Take 1,000 Units by mouth daily.   Yes [provider]  clotrimazole (LOTRIMIN) 1 % cream Apply 1 application topically 2 (two) times daily. To skin between toes. 03/25/19  Yes Wendie Agreste, MD  Cyanocobalamin (VITAMIN B-12 CR PO) Take 500 mcg by mouth daily.    Yes [provider]  fluticasone (FLONASE) 50 MCG/ACT nasal spray Place 1 spray into both nostrils daily. Patient taking differently: Place 1 spray into both nostrils daily as needed.  09/03/17  Yes Bobbitt, [place] Muta, MD  fluticasone (FLOVENT HFA) 44 MCG/ACT inhaler Inhale 2 puffs into the lungs 2 (two) times daily. With spacer. Patient taking differently: Inhale 2 puffs into the lungs as needed. With spacer. 10/22/17  Yes Bobbitt, [place] Muta,  MD  lisinopril-hydrochlorothiazide (ZESTORETIC) 10-12.5 MG tablet Take 1 tablet by mouth daily. 03/25/19  Yes Wendie Agreste, MD  loratadine (CLARITIN) 10 MG tablet Take 1 tablet (10 mg total) by mouth daily. 05/12/18  Yes Nicholas Lose, MD  metFORMIN (GLUCOPHAGE) 500 MG tablet Take 1 tablet (500 mg total) by mouth daily with breakfast. 03/25/19  Yes Wendie Agreste, MD  metoprolol succinate (TOPROL-XL) 25 MG 24 hr tablet Take 1 tablet (25 mg total) by mouth daily. 04/02/19  Yes Miquel Dunn, NP  tamoxifen (NOLVADEX) 10 MG tablet Take 1 tablet (10 mg total) by mouth daily. 05/13/19  Yes Nicholas Lose, MD  Turmeric Curcumin 500 MG CAPS Take by mouth.   Yes [provider]  vitamin C (ASCORBIC ACID) 500 MG tablet Take 500 mg by mouth 2 (two) times daily.    Yes [provider]  Zinc 50 MG TABS  12/17/17  Yes [provider]  rosuvastatin (CRESTOR) 20 MG tablet Take 1 tablet (20 mg total) by mouth daily. 04/30/19 07/29/19  Miquel Dunn, NP    LISINOPRIL PO Take 1 tablet by mouth daily.  06/13/11  [provider]   Social History   Socioeconomic History  . Marital status: Married    Spouse name: [name]  . Number of children: 4  . Years of education: College  . Highest education level: Not on file  Occupational History  . Occupation: retired  Tobacco Use  . Smoking status: Never Smoker  . Smokeless tobacco: Never Used  Substance and Sexual Activity  . Alcohol use: No    Alcohol/week: 0.0 standard drinks  . Drug use: No  . Sexual activity: Yes    Comment: menarche 74, P4, Premarin for short while, Provera x 3 years  Other Topics Concern  . Not on file  Social History Narrative   Marital status: married x 72  years; happily married; no abuse.      Children: 4 children (2 sons living; 1 son died in 4478 at age 46years old, 1 daughter); six grandchildren; no gg.      Lives:  With husband.      Employed: retired in 2011; [employer].      Tobacco: none       Alcohol:  None      Drugs: none      Exercise:  Rowing 3 days per week.      Seatbelt:  100%      Sunscreen:  Face SPF 15.      Guns:  Loaded mostly secured.      ADLs:  Drives minimally; no assistant devices; independent with all ADLs.       Advanced Directives: YES; FULL CODE but no prolonged measures.      Her nocturia is improved under CPAP use at 10 cm water- AHI is now 0.8 and from 30 at baseline. CMS compliance  6 hours 40 minutes - sleeps on the side, 08-10-11 .FFM - likes it.    . Flonase used prn.     Caffeine Use: 1 cup daily   Social Determinants of Health   Financial Resource Strain:   . Difficulty of Paying Living Expenses:   Food Insecurity:   . Worried About Charity fundraiser in the Last Year:   . Arboriculturist in the Last Year:   Transportation Needs:   . Film/video editor (Medical):   Marland Kitchen Lack of Transportation (Non-Medical):   Physical Activity:   .  Days of Exercise per Week:   . Minutes of Exercise  per Session:   Stress:   . Feeling of Stress :   Social Connections:   . Frequency of Communication with Friends and Family:   . Frequency of Social Gatherings with Friends and Family:   . Attends Religious Services:   . Active Member of Clubs or Organizations:   . Attends Archivist Meetings:   Marland Kitchen Marital Status:   Intimate Partner Violence:   . Fear of Current or Ex-Partner:   . Emotionally Abused:   Marland Kitchen Physically Abused:   . Sexually Abused:     Review of Systems  Constitutional: Negative for fatigue and unexpected weight change.  Respiratory: Negative for chest tightness and shortness of breath.   Cardiovascular: Negative for chest pain, palpitations and leg swelling.  Gastrointestinal: Negative for abdominal pain and blood in stool.  Neurological: Negative for dizziness, syncope, light-headedness and headaches.     Objective:   Vitals:   07/22/19 0841  BP: 133/80  Pulse: 89  Temp: 98.3 F (36.8 C)  TempSrc: Temporal  SpO2: 97%  Weight: 232 lb (105.2 kg)  Height: 5\' 1"  (1.549 m)     Physical Exam Vitals reviewed.  Constitutional:      Appearance: She is well-developed. She is obese.  HENT:     Head: Normocephalic and atraumatic.     Left Ear: External ear normal. There is impacted cerumen.     Ears:     Comments: External ear nontender, pain-free with pinna traction, no strong lymphadenopathy.  Obstructed canal with dark yellow-brown cerumen.  No exudate or bleeding Eyes:     Extraocular Movements:     Right eye: No nystagmus.     Left eye: No nystagmus.     Conjunctiva/sclera: Conjunctivae normal.     Pupils: Pupils are equal, round, and reactive to light.  Neck:     Vascular: No carotid bruit.  Cardiovascular:     Rate and Rhythm: Normal rate and regular rhythm.     Heart sounds: Normal heart sounds.  Pulmonary:     Effort: Pulmonary effort is normal.     Breath sounds: Normal breath sounds.  Abdominal:     Palpations: Abdomen is soft.  There is no pulsatile mass.     Tenderness: There is no abdominal tenderness.  Musculoskeletal:     Right knee: Bony tenderness (Tender over anterior patella only, medial and lateral joint line is nontender, pain-free range of motion.) present. No swelling, deformity or effusion. Normal range of motion. Tenderness present.     Right lower leg: Edema (2+ bilateral.  Small healing wound on the right lateral calf without surrounding erythema or exudate.) present.     Left lower leg: Edema present.     Comments: Negative Thompson, Achilles nontender, no defect.  Skin:    General: Skin is warm and dry.  Neurological:     Mental Status: She is alert and oriented to person, place, and time.  Psychiatric:        Behavior: Behavior normal.    Lavage performed with cerumen disimpaction.  Able to hear better, still slight popping noted.  Slight erythema of canal, abraded appearing area at 3:00 without active bleeding.  Slightly dull TM without effusion noted.  No perforation.  DG Knee Complete 4 Views Right  Result Date: 07/22/2019 CLINICAL DATA:  Fall onto right knee 6 days ago. Right knee pain. Initial encounter. EXAM: RIGHT KNEE - COMPLETE 4+ VIEW COMPARISON:  10/04/2014 FINDINGS: No evidence of fracture, dislocation, or joint effusion. Moderate osteoarthritis is again seen involving the medial and patellofemoral compartments. No focal bone lesions identified. IMPRESSION: 1. No acute findings. 2. Moderate osteoarthritis involving medial and patellofemoral compartments. Electronically Signed   By: Marlaine Hind M.D.   On: 07/22/2019 10:34     Assessment & Plan:  ELIBETH CASTILLEJO is a 75 y.o. female . Type 2 diabetes mellitus without complication, without long-term current use of insulin ([place]) - Plan: Hemoglobin A1c  - check A1c. No med changes for now.   Acute pain of right knee - Plan: DG Knee Complete 4 Views Right  -Patella contusion. No sign of fracture seen. Continue symptomatic care with RTC  precautions  Pedal edema  -Chronic, component of PAD also now reported. Has close follow-up with cardiology plan. Would be cautious with high pressure compression stockings given PAD. Walking for exercise may be helpful as well as weight loss.  Impacted cerumen of left ear - Plan: Ear wax removal, neomycin-polymyxin-hydrocortisone (CORTISPORIN) OTIC solution Ear fullness, left - Plan: neomycin-polymyxin-hydrocortisone (CORTISPORIN) OTIC solution  -Improved after lavage. Cortisporin otic if any persistent irritation or soreness. RTC precautions if any persistent difficulty hearing out of that ear or acute worsening.  Essential hypertension  -Stable, continue same regimen.   Meds ordered this encounter  Medications  . neomycin-polymyxin-hydrocortisone (CORTISPORIN) OTIC solution    Sig: Place 3 drops into the left ear 3 (three) times daily. For 1 week.    Dispense:  10 mL    Refill:  0   Patient Instructions   Increase walking for exercise  - this should help with weight loss and leg swelling possibly as well.  Talk to Dr. Einar Gip about leg swelling, compression stockings and pletal if needed for arterial disease of legs.   I will check hemoglobin A1c and let you  know if any diabetes med changes needed.  I do not see any significant break in the bone of the kneecap, but will let you know if there are concerns from the report.  I expect that area to continue to improve.  See information below.  Follow-up if worsening.  Ear looks better.  If any pain or irritation, start antibiotic drops.  If hearing does not continue to improve through the weekend or any worsening symptoms, return for recheck as we discussed.   Contusion A contusion is a deep bruise. Contusions are the result of a blunt injury to tissues and muscle fibers under the skin. The injury causes bleeding under the skin. The skin overlying the contusion may turn blue, purple, or yellow. Minor injuries will give you a painless  contusion, but more severe injuries cause contusions that may stay painful and swollen for a few weeks. Follow these instructions at home: Pay attention to any changes in your symptoms. Let your health care provider know about them. Take these actions to relieve your pain. Managing pain, stiffness, and swelling   Use resting, icing, applying pressure (compression), and raising (elevating) the injured area. This is often called the RICE strategy. ? Rest the injured area. Return to your normal activities as told by your health care provider. Ask your health care provider what activities are safe for you. ? If directed, put ice on the injured area:  Put ice in a plastic bag.  Place a towel between your skin and the bag.  Leave the ice on for 20 minutes, 2-3 times per day. ? If directed, apply light compression to  the injured area using an elastic bandage. Make sure the bandage is not wrapped too tightly. Remove and reapply the bandage as directed by your health care provider. ? If possible, raise (elevate) the injured area above the level of your heart while you are sitting or lying down. General instructions  Take over-the-counter and prescription medicines only as told by your health care provider.  Keep all follow-up visits as told by your health care provider. This is important. Contact a health care provider if:  Your symptoms do not improve after several days of treatment.  Your symptoms get worse.  You have difficulty moving the injured area. Get help right away if:  You have severe pain.  You have numbness in a hand or foot.  Your hand or foot turns pale or cold. Summary  A contusion is a deep bruise.  Contusions are the result of a blunt injury to tissues and muscle fibers under the skin.  It is treated with rest, ice, compression, and elevation. You may be given over-the-counter medicines for pain.  Contact a health care provider if your symptoms do not improve, or  get worse.  Get help right away if you have severe pain, have numbness, or the area turns pale or cold. This information is not intended to replace advice given to you by your health care provider. Make sure you discuss any questions you have with your health care provider. Document Revised: 10/24/2017 Document Reviewed: 10/24/2017 Elsevier Patient Education  [place], Adult The ears produce a substance called earwax that helps keep bacteria out of the ear and protects the skin in the ear canal. Occasionally, earwax can build up in the ear and cause discomfort or hearing loss. What increases the risk? This condition is more likely to develop in people who:  Are female.  Are elderly.  Naturally produce more earwax.  Clean their ears often with cotton swabs.  Use earplugs often.  Use in-ear headphones often.  Wear hearing aids.  Have narrow ear canals.  Have earwax that is overly thick or sticky.  Have eczema.  Are dehydrated.  Have excess hair in the ear canal. What are the signs or symptoms? Symptoms of this condition include:  Reduced or muffled hearing.  A feeling of fullness in the ear or feeling that the ear is plugged.  Fluid coming from the ear.  Ear pain.  Ear itch.  Ringing in the ear.  Coughing.  An obvious piece of earwax that can be seen inside the ear canal. How is this diagnosed? This condition may be diagnosed based on:  Your symptoms.  Your medical history.  An ear exam. During the exam, your health care provider will look into your ear with an instrument called an otoscope. You may have tests, including a hearing test. How is this treated? This condition may be treated by:  Using ear drops to soften the earwax.  Having the earwax removed by a health care provider. The health care provider may: ? Flush the ear with water. ? Use an instrument that has a loop on the end (curette). ? Use a suction  device.  Surgery to remove the wax buildup. This may be done in severe cases. Follow these instructions at home:   Take over-the-counter and prescription medicines only as told by your health care provider.  Do not put any objects, including cotton swabs, into your ear. You can clean the opening of your ear canal with a washcloth  or facial tissue.  Follow instructions from your health care provider about cleaning your ears. Do not over-clean your ears.  Drink enough fluid to keep your urine clear or pale yellow. This will help to thin the earwax.  Keep all follow-up visits as told by your health care provider. If earwax builds up in your ears often or if you use hearing aids, consider seeing your health care provider for routine, preventive ear cleanings. Ask your health care provider how often you should schedule your cleanings.  If you have hearing aids, clean them according to instructions from the manufacturer and your health care provider. Contact a health care provider if:  You have ear pain.  You develop a fever.  You have blood, pus, or other fluid coming from your ear.  You have hearing loss.  You have ringing in your ears that does not go away.  Your symptoms do not improve with treatment.  You feel like the room is spinning (vertigo). Summary  Earwax can build up in the ear and cause discomfort or hearing loss.  The most common symptoms of this condition include reduced or muffled hearing and a feeling of fullness in the ear or feeling that the ear is plugged.  This condition may be diagnosed based on your symptoms, your medical history, and an ear exam.  This condition may be treated by using ear drops to soften the earwax or by having the earwax removed by a health care provider.  Do not put any objects, including cotton swabs, into your ear. You can clean the opening of your ear canal with a washcloth or facial tissue. This information is not intended to  replace advice given to you by your health care provider. Make sure you discuss any questions you have with your health care provider. Document Revised: 02/15/2017 Document Reviewed: 05/16/2016 Elsevier Patient Education  [employer].    If you have lab work done today you will be contacted with your lab results within the next 2 weeks.  If you have not heard from [place] then please contact us. The fastest way to get your results is to register for My Chart.   IF you received an x-ray today, you will receive an invoice from [facility]. Please contact [facility] at [phone] with questions or concerns regarding your invoice.   IF you received labwork today, you will receive an invoice from [facility]. Please contact [facility] at [phone] with questions or concerns regarding your invoice.   Our billing staff will not be able to assist you with questions regarding bills from these companies.  You will be contacted with the lab results as soon as they are available. The fastest way to get your results is to activate your My Chart account. Instructions are located on the last page of this paperwork. If you have not heard from [place] regarding the results in 2 weeks, please contact this office.         Signed, [name], MD [facility]

## 2019-07-23 LAB — HEMOGLOBIN A1C
Est. average glucose Bld gHb Est-mCnc: 180 mg/dL
Hgb A1c MFr Bld: 7.9 % — ABNORMAL HIGH (ref 4.8–5.6)

## 2019-07-28 NOTE — Progress Notes (Signed)
Primary Physician/Referring:  Wendie Agreste, MD  Patient ID: [name], female    DOB: 01-08-1946, 74 y.o.   MRN: [id number]  Chief Complaint  Patient presents with  . PAD  . Follow-up    3 month  . Hyperlipidemia  . Hypertension   HPI:    [name]  is a 74 y.o. female  with PSVT, hypertension, type 2 diabetes, hyperlipidemia, morbid obesity, OSA on CPAP, right leg claudication, presents here for follow-up of PAD and also hyperlipidemia. She has not had any chest pain, no recent palpitations or leg edema.  She remained stable, symptoms of claudication are remained stable, no rest pain.  No chest pain, no palpitations or dyspnea.  Past Medical History:  Diagnosis Date  . Allergic rhinitis 03/23/2013  . Allergy   . Anemia    due to vaginal bleeding  . Arthritis    hands  . Asthmatic bronchitis 09/03/2017  . Breast cancer ([place]) 08/2012   left  . Dental crowns present   . Eczema   . Fibroids   . Headache(784.0)    tension  . History of endometriosis   . Hyperlipidemia   . Hypertension    under control with med., has been on med. x 3 yr.  . OSA (obstructive sleep apnea)   . Postmenopausal bleeding   . Recurrent upper respiratory infection (URI)   . S/P radiation therapy 10/20/2012-12/05/2012   left breast 50.4 gray, lumpectomy cavity boosted to 62.4 gray  . Seasonal allergies   . Sleep apnea    uses CPAP nightly  . Urinary urgency   . White coat hypertension    Past Surgical History:  Procedure Laterality Date  . BACK SURGERY  1997   lumbar  . BREAST LUMPECTOMY WITH NEEDLE LOCALIZATION AND AXILLARY SENTINEL LYMPH NODE BX Left 09/03/2012   Procedure: BREAST LUMPECTOMY WITH NEEDLE LOCALIZATION AND AXILLARY SENTINEL LYMPH NODE BX;  Surgeon: Edward Jolly, MD;  Location: [place];  Service: General;  Laterality: Left;  . CHOLECYSTECTOMY  1995  . COLONOSCOPY W/ POLYPECTOMY  03/20/2007  . DILATION AND CURETTAGE OF UTERUS  2005  .  DILATION AND CURETTAGE OF UTERUS  06/22/2011   Procedure: DILATATION AND CURETTAGE;  Surgeon: Alvino Chapel, MD;  Location: [facility];  Service: Gynecology;  Laterality: N/A;  OK PER KEELA FOR 7:15 START  . DORSAL COMPARTMENT RELEASE Right 05/19/2013   Procedure: RELEASE 1ST  DORSAL COMPARTMENT RIGHT (DEQUERVAIN);  Surgeon: Cammie Sickle., MD;  [facility];  Service: Orthopedics;  Laterality: Right;  . EXPLORATORY LAPAROTOMY  06-30-2007   ATTEMPTED HYSTERECTOMY ABORTED DUE TO EXTENSIVE ENDOMETRIOSIS W/ DENSE PELVIC ADHESIONS INVOLVING UTERUS AND LOWER RECTOSIGMOID  . HYSTEROSCOPY WITH D & C  05/06/2009   Family History  Problem Relation Age of Onset  . Vision loss Mother   . Hypertension Mother   . Anesthesia problems Mother        post-op N/V  . Heart disease Mother        carotid stenosis s/p CAE  . Osteoporosis Mother   . Eczema Mother   . Depression Father   . Heart disease Sister 18       arrhythmia  . Hypertension Sister   . Hyperlipidemia Sister   . Diabetes Brother   . Hypertension Brother   . Cancer Brother        skin  . Hyperlipidemia Brother   . Stroke Brother   .  COPD Daughter   . Atopy Daughter   . Cancer Cousin   . Kidney failure Maternal Grandmother   . Heart disease Maternal Grandfather   . Hypertension Other   . Colon cancer Neg Hx   . Allergic rhinitis Neg Hx   . Asthma Neg Hx   . Urticaria Neg Hx     Social History   Tobacco Use  . Smoking status: Never Smoker  . Smokeless tobacco: Never Used  Substance Use Topics  . Alcohol use: No    Alcohol/week: 0.0 standard drinks   Marital Status: Married  ROS  Review of Systems  Cardiovascular: Positive for claudication. Negative for dyspnea on exertion, leg swelling and syncope.  Respiratory: Negative for shortness of breath.   Musculoskeletal: Negative for joint swelling.   Objective  Blood pressure 120/70, pulse 82, temperature 97.8 F (36.6 C),  temperature source Temporal, resp. rate 16, height 5\' 1"  (1.549 m), weight 245 lb (111.1 kg), SpO2 98 %.  Vitals with BMI 07/30/2019 07/22/2019 07/09/2019  Height 5\' 1"  5\' 1"  5\' 1"   Weight 245 lbs 232 lbs 230 lbs  BMI 46.32 [id number]  Systolic 123456 [id number] 0000000  Diastolic 70 80 72  Pulse 82 89 74     Physical Exam  Constitutional: She appears well-developed and well-nourished. No distress.  Cardiovascular: Normal rate, regular rhythm and intact distal pulses. Exam reveals no gallop.  No murmur heard. Pulses:      Dorsalis pedis pulses are 0 on the right side and 0 on the left side.       Posterior tibial pulses are 0 on the right side and 0 on the left side.  Pulses difficult to feel due to bodily habitus   Pulmonary/Chest: Effort normal and breath sounds normal. No accessory muscle usage. No respiratory distress.  Abdominal: Soft.   Laboratory examination:   Recent Labs    09/09/18 1441 03/25/19 1042 07/01/19 0949  NA 140 139 137  K 4.0 4.7 4.7  CL 100 100 99  CO2 24 23 25   GLUCOSE 120* 167* 180*  BUN 16 15 12   CREATININE 0.71 0.66 0.81  CALCIUM 10.0 10.4* 9.3  GFRNONAA 85 87 72  GFRAA 98 101 83   CrCl cannot be calculated (Patient's most recent lab result is older than the maximum 21 days allowed.).  CMP Latest Ref Rng & Units 07/01/2019 03/25/2019 09/09/2018  Glucose 65 - 99 mg/dL 180(H) 167(H) 120(H)  BUN 8 - 27 mg/dL 12 15 16   Creatinine 0.57 - 1.00 mg/dL 0.81 0.66 0.71  Sodium 134 - 144 mmol/L 137 139 140  Potassium 3.5 - 5.2 mmol/L 4.7 4.7 4.0  Chloride 96 - 106 mmol/L 99 100 100  CO2 20 - 29 mmol/L 25 23 24   Calcium 8.7 - 10.3 mg/dL 9.3 10.4(H) 10.0  Total Protein 6.0 - 8.5 g/dL 6.7 6.9 7.0  Total Bilirubin 0.0 - 1.2 mg/dL 0.5 0.5 0.5  Alkaline Phos 39 - 117 IU/L 100 100 69  AST 0 - 40 IU/L 26 20 29   ALT 0 - 32 IU/L 15 11 17    CBC Latest Ref Rng & Units 07/29/2017 04/15/2017 12/11/2016  WBC 3.4 - 10.8 x10E3/uL 5.5 5.6 6.4  Hemoglobin 11.1 - 15.9 g/dL 13.0 12.4 12.8    Hematocrit 34.0 - 46.6 % 38.1 38.7 38.6  Platelets 150 - 379 x10E3/uL 228 234 263   Lipid Panel     Component Value Date/Time   CHOL 130 07/01/2019 0948   TRIG 152 (  H) 07/01/2019 0948   HDL 38 (L) 07/01/2019 0948   CHOLHDL 3.4 07/01/2019 0948   CHOLHDL 3.7 12/22/2015 0940   VLDL 27 12/22/2015 0940   LDLCALC 66 07/01/2019 0948   HEMOGLOBIN A1C Lab Results  Component Value Date   HGBA1C 7.9 (H) 07/22/2019   MPG 120 12/22/2015   TSH No results for input(s): TSH in the last 8760 hours.  External labs:   TSH 1.570 12/11/2016  Medications and allergies   Allergies  Allergen Reactions  . Peanut (Diagnostic) Cough, Hypertension and Shortness Of Breath  . Adhesive [Tape] Rash  . Crestor [Rosuvastatin] Other (See Comments)    Severe arthralgia and myalgia  . Latex Itching and Other (See Comments)    Causes blisters  . Lovastatin Other (See Comments)    Arthralgias, hyperglycemia with lovastatin.     Current Outpatient Medications  Medication Instructions  . aspirin 81 mg, Daily  . Brimonidine Tartrate-Timolol (COMBIGAN OP) Ophthalmic  . cholecalciferol (VITAMIN D) 1,000 Units, Oral, Daily  . clotrimazole (LOTRIMIN) 1 % cream 1 application, Topical, 2 times daily, To skin between toes.  . Cyanocobalamin (VITAMIN B-12 CR PO) 500 mcg, Daily  . ezetimibe (ZETIA) 10 mg, Oral, Daily after supper  . fluticasone (FLONASE) 50 MCG/ACT nasal spray 1 spray, Each Nare, Daily  . fluticasone (FLOVENT HFA) 44 MCG/ACT inhaler 2 puffs, Inhalation, 2 times daily, With spacer.  Marland Kitchen lisinopril-hydrochlorothiazide (ZESTORETIC) 10-12.5 MG tablet 1 tablet, Oral, Daily  . loratadine (CLARITIN) 10 mg, Oral, Daily  . metFORMIN (GLUCOPHAGE) 500 mg, Oral, Daily with breakfast  . metoprolol succinate (TOPROL-XL) 25 mg, Oral, Daily  . pravastatin (PRAVACHOL) 20 mg, Oral, Every other day  . tamoxifen (NOLVADEX) 10 mg, Oral, Daily  . Turmeric Curcumin 500 MG CAPS Oral  . vitamin C (ASCORBIC ACID) 500  mg, Oral, 2 times daily  . Zinc 50 MG TABS No dose, route, or frequency recorded.   Radiology:   No results found.  Cardiac Studies:   Event monitor 09/27/15-2015/10/30: Sinus rhythm, heart rate ranging between 59-138/m. PACs. One run of PSVT seen on 09/29/2015 at 2.26 AM at average heart rate-150/m.   Lexiscan myoview stress test 09/09/2017: 1. Lexiscan stress test was performed. Exercise capacity was not assessed. Stress symptoms included dyspnea, dizziness. Peak blood pressure was 158/56 mmHg. The stress electrocardiogram showed sinus tachycardia, normal stress conduction, no stress arrhythmias and normal stress repolarization. Stress EKG is non diagnostic for ischemia as it is a pharmacologic stress. 2. The overall quality of the study is excellent. There is no evidence of abnormal lung activity. Stress and rest SPECT images demonstrate homogeneous tracer distribution throughout the myocardium. Gated SPECT imaging reveals normal myocardial thickening and wall motion. The left ventricular ejection fraction was normal (76%). 3. Low risk study.   Echocardiogram 09/17/2017: Left ventricle cavity is normal in size. Mild concentric hypertrophy of the left ventricle. Normal global wall motion. Doppler evidence of grade I (impaired) diastolic dysfunction, normal LAP. Calculated EF 56%. Right ventricle cavity is mildly dilated. Normal right ventricular function. Mild tricuspid regurgitation. Mild pulmonary hypertension. Estimated pulmonary artery systolic pressure 32 mmHg IVC is dilated with respiratory variation. This may suggest elevated right heart pressure. Compared to the store on 11/23/25, mild RV dilatation and mild pulmonary hypertension new.  Lower Extremity Arterial Duplex 04/23/2019:  No hemodynamically significant stenoses are identified in the bilateral lower extremity arterial system. Severely abnormal waveform at the right PT suggests severe diffuse disease.  This exam reveals normal perfusion  of the  right and left lower extremity (ABI 1.46). ABI may be falsely elevated in patients with DM.  EKG    04/02/2019: Normal sinus rhythm at 84 bpm, normal axis, nonspecific T wave abnormality.  Assessment     ICD-10-CM   1. PAD (peripheral artery disease) (HCC)  I73.9 ezetimibe (ZETIA) 10 MG tablet  2. Primary hypertension  I10   3. Pure hypercholesterolemia  E78.00 ezetimibe (ZETIA) 10 MG tablet    Lipid Panel With LDL/HDL Ratio  4. Morbid obesity ([place])  E66.01      Meds ordered this encounter  Medications  . ezetimibe (ZETIA) 10 MG tablet    Sig: Take 1 tablet (10 mg total) by mouth daily after supper.    Dispense:  30 tablet    Refill:  2    Medications Discontinued During This Encounter  Medication Reason  . neomycin-polymyxin-hydrocortisone (CORTISPORIN) OTIC solution Prescription never filled    Recommendations:   [name]  is a 74 y.o.  female  with PSVT, hypertension, type 2 diabetes, hyperlipidemia, morbid obesity, OSA on CPAP, right leg claudication, presents here for follow-up of PAD and also hyperlipidemia. She has not had any chest pain, no recent palpitations or leg edema.  Symptoms of peripheral arterial disease with right leg claudication are stable, advised her to continue to increase her physical activity which is certainly help in improving collateral circulation and small vessels.  With regard to hypertension, blood pressure is very well controlled.  With regard to hyperlipidemia, with Crestor her lipids had completely normalized, goal LDL is <70 in view of PAD, however she has not been able to tolerate this due to severe myalgias and arthralgias.  Only medication so far she is tolerating his pravastatin 20 mg every other day.  Baseline LDL on that regimen was 135, hence will need Zetia 10 mg daily.  If lipids which will be performed again in 2 months are not at goal, patient is willing to try 40 mg of pravastatin today.  I like to see her back in 3  months for continued risk modification.  Adrian Prows, MD, [facility] 07/30/2019, 9:42 AM [facility] [place] Pager: [phone] Office: [phone]

## 2019-07-30 ENCOUNTER — Other Ambulatory Visit: Payer: Self-pay

## 2019-07-30 ENCOUNTER — Ambulatory Visit: Payer: Medicare Other | Admitting: Cardiology

## 2019-07-30 ENCOUNTER — Encounter: Payer: Self-pay | Admitting: Cardiology

## 2019-07-30 VITALS — BP 120/70 | HR 82 | Temp 97.8°F | Resp 16 | Ht 61.0 in | Wt 245.0 lb

## 2019-07-30 DIAGNOSIS — E7800 Pure hypercholesterolemia, unspecified: Secondary | ICD-10-CM

## 2019-07-30 DIAGNOSIS — I739 Peripheral vascular disease, unspecified: Secondary | ICD-10-CM | POA: Diagnosis not present

## 2019-07-30 DIAGNOSIS — I10 Essential (primary) hypertension: Secondary | ICD-10-CM | POA: Diagnosis not present

## 2019-07-30 MED ORDER — EZETIMIBE 10 MG PO TABS: 10.0000 mg | ORAL_TABLET | Freq: Every day | ORAL | 2 refills | Status: DC

## 2019-07-30 NOTE — Patient Instructions (Signed)
Get blood work in second or third of J[place] 2021

## 2019-07-31 NOTE — Progress Notes (Signed)
  IMPRESSION: CPAP dependent patient retested for baseline.   1. Confirmed moderate severe Obstructive Sleep Apnea (OSA) at AHI of 27.4/h, strongly REM sleep dependent with a REM AHI of 75.9/h, and without prolonged hypoxemia.   RECOMMENDATIONS:  1. The patient will receive a new autotitration capable CPAP machine and mask of her choice, with heated humidity, and pressure settings from 6-16 cm water, 2 cm EPR.

## 2019-07-31 NOTE — Procedures (Signed)
PATIENT'S NAME:  [name], [name] DOB:      Aug 20, 1945      MR#:    [id number]     DATE OF RECORDING: 07/22/2019 REFERRING M.D.:  [name], MD Study Performed:   Baseline Polysomnogram HISTORY:  [name] was seen on 07/09/19  Last sleep study 09/2009 but she got a new dream-station [employer] machine without retesting in July 2016, provided without prescription by [place] Patient. Patient states most recent machine is >75 years old. she uses that one at home and it's a [employer]- [employer] with a 95% pressure of 10.6 cm water.  We were able to get download on her back up machine (older CPAP ResMed).  Her newer machine is now also 75 years old and she needs a new one. She desperately wants to change DMEs.  Interval history: left sided Wet Macular Degeneration and right sided Glaucoma, Dr Midge Aver, Dr. Zadie Rhine. OSA certainly can influence the course of her eye disease. Also, she is now 7 years into her breast cancer treatment.    The patient endorsed the Epworth Sleepiness Scale at 2 ( ON CPAP ) points.   The patient's weight 230 pounds with a height of 61 (inches), resulting in a BMI of 43.3 kg/m2. The patient's neck circumference measured 17 inches.  CURRENT MEDICATIONS: ASA 81mg , Combigan eye, Vit D, Vit b12, Flonase, Flovent, Zestoretic, Claritin, Glucophage, Toprol-XL, Crestor, Nolvadex   PROCEDURE:  This is a multichannel digital polysomnogram utilizing the Somnostar 11.2 system.  Electrodes and sensors were applied and monitored per AASM Specifications.   EEG, EOG, Chin and Limb EMG, were sampled at 200 Hz.  ECG, Snore and Nasal Pressure, Thermal Airflow, Respiratory Effort, CPAP Flow and Pressure, Oximetry was sampled at 50 Hz. Digital video and audio were recorded.      BASELINE STUDY: Lights Out was at 21:42 and Lights On at 04:34.  Total recording time (TRT) was 413 minutes, with a total sleep time (TST) of 243.5 minutes.  The patient's sleep latency was 2.5  minutes.  REM latency was 356.5 minutes.  The sleep efficiency was poor at 59. 0%.     SLEEP ARCHITECTURE: WASO (Wake after sleep onset) was 166.5 minutes.  There were 41.5 minutes in Stage N1, 121 minutes Stage N2, 56.5 minutes Stage N3 and 24.5 minutes in Stage REM.  The percentage of Stage N1 was 17.%, Stage N2 was 49.7%, Stage N3 was 23.2% and Stage R (REM sleep) was 10.1%.   RESPIRATORY ANALYSIS: There were a total of 111 respiratory events:  3 obstructive apneas, 0 central apneas and 108 hypopneas with a hypopnea index of 26.6 /hour. The patient also had 0 respiratory event related arousals (RERAs). The total APNEA/HYPOPNEA INDEX (AHI) was 27.4/hour and the total RESPIRATORY DISTURBANCE INDEX was 27.4 /hour.  31 events occurred in REM sleep and 154 events in NREM. The REM AHI was 75.9 /hour, versus a non-REM AHI of 21.9. The patient spent 243.5 minutes of total sleep time in the supine position and 0 minutes in non-supine. The supine AHI was 27.3 versus a non-supine AHI of 0.0.  OXYGEN SATURATION & C02:  The Wake baseline 02 saturation was 96%, with the lowest being 81%. Time spent below 89% saturation equaled 18 minutes.  The arousals were noted as: 79 were spontaneous, 0 were associated with PLMs, 71 were associated with respiratory events. The patient had a total of 0 Periodic Limb Movements.   Audio and video analysis did not show any  abnormal or unusual movements, behaviors, phonations or vocalizations.  Snoring was noted. EKG was in keeping with normal sinus rhythm (NSR).  IMPRESSION:  1. Confirmed moderate severe Obstructive Sleep Apnea (OSA), strongly REM sleep dependent, without prolonged hypoxemia.   RECOMMENDATIONS:  1. The patient will receive a new autotitration capable CPAP machine and mask of her choice, with heated humidity, and pressure settings from 6-16 cm water, 2 cm EPR.     I certify that I have reviewed the entire raw data recording prior to the issuance of this  report in accordance with the Standards of Accreditation of the American Academy of Sleep Medicine (AASM)   Larey Seat, MD Diplomat, American Board of Psychiatry and Neurology  Diplomat, American Board of Sleep Medicine Market researcher, [facility] [employer]

## 2019-07-31 NOTE — Addendum Note (Signed)
Addended by: Larey Seat on: 07/31/2019 12:39 PM   Modules accepted: Orders

## 2019-08-03 ENCOUNTER — Telehealth: Payer: Self-pay | Admitting: Neurology

## 2019-08-03 NOTE — Telephone Encounter (Signed)
I called pt. I advised pt that Dr. Brett Fairy reviewed their sleep study results and found that pt moderate to severe sleep apnea. Dr. Brett Fairy recommends that pt starts auto CPAP. I reviewed PAP compliance expectations with the pt. Pt is agreeable to starting a CPAP. I advised pt that an order will be sent to a DME, [facility], and [facility] will call the pt within about one week after they file with the pt's insurance. [facility] will show the pt how to use the machine, fit for masks, and troubleshoot the CPAP if needed. A follow up appt will need to be made for insurance purposes. It will need to be scheduled within 31-90 days after starting the new machine.  A letter with all of this information in it will be sent to the pt as a reminder on mychart. Pt verbalized understanding of results. Pt had no questions at this time but was encouraged to call back if questions arise. I have sent the order to [facility] and have received confirmation that they have received the order.

## 2019-08-03 NOTE — Telephone Encounter (Signed)
-----   Message from Larey Seat, MD sent at 07/31/2019 12:39 PM EDT -----  IMPRESSION:  1. Confirmed moderate severe Obstructive Sleep Apnea (OSA) at AHI of 27.4/h, strongly REM sleep dependent with a REM AHI of 75.9/h, and without prolonged hypoxemia.   RECOMMENDATIONS:  1. The patient will receive a new autotitration capable CPAP machine and mask of her choice, with heated humidity, and pressure settings from 6-16 cm water, 2 cm EPR.

## 2019-08-10 ENCOUNTER — Other Ambulatory Visit: Payer: Self-pay

## 2019-08-10 ENCOUNTER — Ambulatory Visit (INDEPENDENT_AMBULATORY_CARE_PROVIDER_SITE_OTHER): Payer: Medicare Other | Admitting: Ophthalmology

## 2019-08-10 DIAGNOSIS — H353221 Exudative age-related macular degeneration, left eye, with active choroidal neovascularization: Secondary | ICD-10-CM | POA: Diagnosis not present

## 2019-08-10 MED ORDER — AFLIBERCEPT 2MG/0.05ML IZ SOLN FOR KALEIDOSCOPE
2.0000 mg | INTRAVITREAL | Status: AC | PRN
  Administered 2019-08-10: 2 mg via INTRAVITREAL

## 2019-08-10 NOTE — Patient Instructions (Signed)
Patient denies confirmatory testing recently has severe obstructive sleep apnea and will continue on CPAP.

## 2019-08-10 NOTE — Assessment & Plan Note (Signed)
Commenced with intravitreal Eylea OS today.The nature of wet macular degeneration was discussed with the patient.  Forms of therapy reviewed include the use of Anti-VEGF medications injected painlessly into the eye, as well as other possible treatment modalities, including thermal laser therapy. Fellow eye involvement and risks were discussed with the patient. Upon the finding of wet age related macular degeneration, treatment will be offered. The treatment regimen is on a treat as needed basis with the intent to treat if necessary and extend interval of exams when possible. On average 1 out of 6 patients do not need lifetime therapy. However, the risk of recurrent disease is high for a lifetime.  Initially monthly, then periodic, examinations and evaluations will determine whether the next treatment is required on the day of the examination.

## 2019-08-10 NOTE — Telephone Encounter (Signed)
Received a message from [place] "pt got her last machine 11/12/14, so she will not be eligible for a new one until 11/13/19. I called and let the pt know. "

## 2019-08-10 NOTE — Progress Notes (Signed)
08/10/2019     CHIEF COMPLAINT Patient presents for Retina Follow Up   HISTORY OF PRESENT ILLNESS: [name] is a 74 y.o. female who presents to the clinic today for:   HPI    Retina Follow Up    Patient presents with  Wet AMD.  In left eye.  Duration of 5 weeks.  Since onset it is stable.          Comments    5 week follow up - OCT OU, Possible Eylea OS Patient states that she thinks there was an improvement in her vision, but states that lats few days she has had 'goop' in her eyes and its blurred her vision. LBS 132  A1C 7.26 June 2019       Last edited by Gerda Diss on 08/10/2019  8:25 AM. (History)      Referring physician: Wendie Agreste, MD [address] [place],  [place] [address]  HISTORICAL INFORMATION:   Selected notes from the MEDICAL RECORD NUMBER    Lab Results  Component Value Date   HGBA1C 7.9 (H) 07/22/2019     CURRENT MEDICATIONS: Current Outpatient Medications (Ophthalmic Drugs)  Medication Sig   Brimonidine Tartrate-Timolol (COMBIGAN OP) Apply to eye.   No current facility-administered medications for this visit. (Ophthalmic Drugs)   Current Outpatient Medications (Other)  Medication Sig   aspirin 81 MG tablet Take 81 mg by mouth daily.   cholecalciferol (VITAMIN D) 1000 units tablet Take 1,000 Units by mouth daily.   clotrimazole (LOTRIMIN) 1 % cream Apply 1 application topically 2 (two) times daily. To skin between toes. (Patient not taking: Reported on 07/30/2019)   Cyanocobalamin (VITAMIN B-12 CR PO) Take 500 mcg by mouth daily.    ezetimibe (ZETIA) 10 MG tablet Take 1 tablet (10 mg total) by mouth daily after supper.   fluticasone (FLONASE) 50 MCG/ACT nasal spray Place 1 spray into both nostrils daily. (Patient not taking: Reported on 07/30/2019)   fluticasone (FLOVENT HFA) 44 MCG/ACT inhaler Inhale 2 puffs into the lungs 2 (two) times daily. With spacer. (Patient not taking: Reported on 07/30/2019)    lisinopril-hydrochlorothiazide (ZESTORETIC) 10-12.5 MG tablet Take 1 tablet by mouth daily.   loratadine (CLARITIN) 10 MG tablet Take 1 tablet (10 mg total) by mouth daily.   metFORMIN (GLUCOPHAGE) 500 MG tablet Take 1 tablet (500 mg total) by mouth daily with breakfast.   metoprolol succinate (TOPROL-XL) 25 MG 24 hr tablet Take 1 tablet (25 mg total) by mouth daily.   pravastatin (PRAVACHOL) 20 MG tablet Take 20 mg by mouth every other day.   tamoxifen (NOLVADEX) 10 MG tablet Take 1 tablet (10 mg total) by mouth daily.   Turmeric Curcumin 500 MG CAPS Take by mouth.   vitamin C (ASCORBIC ACID) 500 MG tablet Take 500 mg by mouth 2 (two) times daily.    Zinc 50 MG TABS    Current Facility-Administered Medications (Other)  Medication Route   EPINEPHrine (Anaphylaxis) SOLN 0.3 mg Intramuscular      REVIEW OF SYSTEMS:    ALLERGIES Allergies  Allergen Reactions   Peanut (Diagnostic) Cough, Hypertension and Shortness Of Breath   Adhesive [Tape] Rash   Crestor [Rosuvastatin] Other (See Comments)    Severe arthralgia and myalgia   Latex Itching and Other (See Comments)    Causes blisters   Lovastatin Other (See Comments)    Arthralgias, hyperglycemia with lovastatin.    PAST MEDICAL HISTORY Past Medical History:  Diagnosis Date  Allergic rhinitis 03/23/2013   Allergy    Anemia    due to vaginal bleeding   Arthritis    hands   Asthmatic bronchitis 09/03/2017   Breast cancer ([facility]) 08/2012   left   Dental crowns present    Eczema    Fibroids    Headache(784.0)    tension   History of endometriosis    Hyperlipidemia    Hypertension    under control with med., has been on med. x 3 yr.   OSA (obstructive sleep apnea)    Postmenopausal bleeding    Recurrent upper respiratory infection (URI)    S/P radiation therapy 10/20/2012-12/05/2012   left breast 50.4 gray, lumpectomy cavity boosted to 62.4 gray   Seasonal allergies    Sleep apnea    uses  CPAP nightly   Urinary urgency    White coat hypertension    Past Surgical History:  Procedure Laterality Date   BACK SURGERY  1997   lumbar   BREAST LUMPECTOMY WITH NEEDLE LOCALIZATION AND AXILLARY SENTINEL LYMPH NODE BX Left 09/03/2012   Procedure: BREAST LUMPECTOMY WITH NEEDLE LOCALIZATION AND AXILLARY SENTINEL LYMPH NODE BX;  Surgeon: Edward Jolly, MD;  Location: [facility];  Service: General;  Laterality: Left;   CHOLECYSTECTOMY  1995   COLONOSCOPY W/ POLYPECTOMY  03/20/2007   DILATION AND CURETTAGE OF UTERUS  2005   DILATION AND CURETTAGE OF UTERUS  06/22/2011   Procedure: DILATATION AND CURETTAGE;  Surgeon: Alvino Chapel, MD;  Location: [facility];  Service: Gynecology;  Laterality: N/A;  OK PER KEELA FOR 7:15 START   DORSAL COMPARTMENT RELEASE Right 05/19/2013   Procedure: RELEASE 1ST  DORSAL COMPARTMENT RIGHT (DEQUERVAIN);  Surgeon: Cammie Sickle., MD;  Location: [facility];  Service: Orthopedics;  Laterality: Right;   EXPLORATORY LAPAROTOMY  06-30-2007   ATTEMPTED HYSTERECTOMY ABORTED DUE TO EXTENSIVE ENDOMETRIOSIS W/ DENSE PELVIC ADHESIONS INVOLVING UTERUS AND LOWER RECTOSIGMOID   HYSTEROSCOPY WITH D & C  05/06/2009    FAMILY HISTORY Family History  Problem Relation Age of Onset   Vision loss Mother    Hypertension Mother    Anesthesia problems Mother        post-op N/V   Heart disease Mother        carotid stenosis s/p CAE   Osteoporosis Mother    Eczema Mother    Depression Father    Heart disease Sister 38       arrhythmia   Hypertension Sister    Hyperlipidemia Sister    Diabetes Brother    Hypertension Brother    Cancer Brother        skin   Hyperlipidemia Brother    Stroke Brother    COPD Daughter    Atopy Daughter    Cancer Cousin    Kidney failure Maternal Grandmother    Heart disease Maternal Grandfather    Hypertension Other    Colon cancer Neg Hx     Allergic rhinitis Neg Hx    Asthma Neg Hx    Urticaria Neg Hx     SOCIAL HISTORY Social History   Tobacco Use   Smoking status: Never Smoker   Smokeless tobacco: Never Used  Substance Use Topics   Alcohol use: No    Alcohol/week: 0.0 standard drinks   Drug use: No         OPHTHALMIC EXAM:  Base Eye Exam    Visual Acuity (Snellen - Linear)  Right Left   Dist cc 20/30 20/30+1   Dist ph cc 20/25-2 20/25-2   Correction: Glasses       Tonometry (Tonopen, 8:31 AM)      Right Left   Pressure 15 13       Pupils      Pupils Dark Light Shape React APD   Right PERRL 4 3 Round Slow None   Left PERRL 4 3 Round Slow None       Visual Fields (Counting fingers)      Left Right    Full Full       Extraocular Movement      Right Left    Full Full       Neuro/Psych    Oriented x3: Yes   Mood/Affect: Normal       Dilation    Left eye: 1.0% Mydriacyl, 2.5% Phenylephrine @ 8:31 AM        Slit Lamp and Fundus Exam    External Exam      Right Left   External Normal Normal       Slit Lamp Exam      Right Left   Lids/Lashes Normal Normal   Conjunctiva/Sclera White and quiet White and quiet   Cornea Clear Clear   Anterior Chamber Deep and quiet Deep and quiet   Iris Round and reactive Round and reactive   Anterior Vitreous Normal Normal       Fundus Exam      Right Left   Posterior Vitreous  Normal   Disc  Normal   C/D Ratio  0.45   Macula  Retinal pigment epithelial mottling, no exudates, no hemorrhage, no macular thickening, Early age related macular degeneration, Pigmented atrophy   Vessels  Normal   Periphery  Normal          IMAGING AND PROCEDURES  Imaging and Procedures for 08/10/19  OCT, Retina - OU - Both Eyes       Right Eye Quality was good. Scan locations included subfoveal. Central Foveal Thickness: 238. Progression has been stable. Findings include no IRF, no SRF.   Left Eye Quality was good. Scan locations included  subfoveal. Central Foveal Thickness: 241. Progression has improved. Findings include abnormal foveal contour, pigment epithelial detachment, subretinal hyper-reflective material, subretinal fluid.   Notes OD, stable,    OS, subretinal fluid are still worse on Avastin.  This left eye is resistant to Avastin use.  We will commence with intravitreal Eylea OS today       Intravitreal Injection, Pharmacologic Agent - OS - Left Eye       Time Out 08/10/2019. 8:59 AM. Confirmed correct patient, procedure, site, and patient consented.   Anesthesia Topical anesthesia was used. Anesthetic medications included Akten 3.5%.   Procedure Preparation included Ofloxacin , 10% betadine to eyelids, 5% betadine to ocular surface. A 30 gauge needle was used.   Injection:  2 mg aflibercept Alfonse Flavors) SOLN   NDC: [id number], Lot: [id number]   Route: Intravitreal, Site: Left Eye, Waste: 0 mg  Post-op Post injection exam found visual acuity of at least counting fingers. The patient tolerated the procedure well. There were no complications. The patient received written and verbal post procedure care education. Post injection medications were not given.                 ASSESSMENT/PLAN:  Exudative age-related macular degeneration of left eye with active choroidal neovascularization ([place]) Commenced with intravitreal Eylea OS today.The nature of wet  macular degeneration was discussed with the patient.  Forms of therapy reviewed include the use of Anti-VEGF medications injected painlessly into the eye, as well as other possible treatment modalities, including thermal laser therapy. Fellow eye involvement and risks were discussed with the patient. Upon the finding of wet age related macular degeneration, treatment will be offered. The treatment regimen is on a treat as needed basis with the intent to treat if necessary and extend interval of exams when possible. On average 1 out of 6 patients do not need  lifetime therapy. However, the risk of recurrent disease is high for a lifetime.  Initially monthly, then periodic, examinations and evaluations will determine whether the next treatment is required on the day of the examination.      ICD-10-CM   1. Exudative age-related macular degeneration of left eye with active choroidal neovascularization (HCC)  H35.3221 OCT, Retina - OU - Both Eyes    Intravitreal Injection, Pharmacologic Agent - OS - Left Eye    aflibercept (EYLEA) SOLN 2 mg    1.OS, subretinal fluid are still worse on Avastin.  This left eye is resistant to Avastin use.  We will commence with intravitreal Eylea OS today  2.  Discussed the importance of continued CPAP use to maximize oxygenation to the macula OU.  3.  Ophthalmic Meds Ordered this visit:  Meds ordered this encounter  Medications   aflibercept (EYLEA) SOLN 2 mg       Return in about 5 weeks (around 09/14/2019) for EYLEA OCT, OS, OPTOS FFA L/R, COLOR FP.  Patient Instructions  Patient denies confirmatory testing recently has severe obstructive sleep apnea and will continue on CPAP.    Explained the diagnoses, plan, and follow up with the patient and they expressed understanding.  Patient expressed understanding of the importance of proper follow up care.   Clent Demark Sayer Masini M.D. Diseases & Surgery of the Retina and Vitreous Retina [facility] Diabetic [place] 08/10/19     Abbreviations: M myopia (nearsighted); A astigmatism; H hyperopia (farsighted); P presbyopia; Mrx spectacle prescription;  CTL contact lenses; OD right eye; OS left eye; OU both eyes  XT exotropia; ET esotropia; PEK punctate epithelial keratitis; PEE punctate epithelial erosions; DES dry eye syndrome; MGD meibomian gland dysfunction; ATs artificial tears; PFAT's preservative free artificial tears; [place] nuclear sclerotic cataract; PSC posterior subcapsular cataract; ERM epi-retinal membrane; PVD posterior vitreous detachment; RD retinal detachment;  DM diabetes mellitus; DR diabetic retinopathy; NPDR non-proliferative diabetic retinopathy; PDR proliferative diabetic retinopathy; CSME clinically significant macular edema; DME diabetic macular edema; dbh dot blot hemorrhages; CWS cotton wool spot; POAG primary open angle glaucoma; C/D cup-to-disc ratio; HVF humphrey visual field; GVF goldmann visual field; OCT optical coherence tomography; IOP intraocular pressure; BRVO Branch retinal vein occlusion; CRVO central retinal vein occlusion; CRAO central retinal artery occlusion; BRAO branch retinal artery occlusion; RT retinal tear; SB scleral buckle; PPV pars plana vitrectomy; VH Vitreous hemorrhage; PRP panretinal laser photocoagulation; IVK intravitreal kenalog; VMT vitreomacular traction; MH Macular hole;  NVD neovascularization of the disc; NVE neovascularization elsewhere; AREDS age related eye disease study; ARMD age related macular degeneration; POAG primary open angle glaucoma; EBMD epithelial/anterior basement membrane dystrophy; ACIOL anterior chamber intraocular lens; IOL intraocular lens; PCIOL posterior chamber intraocular lens; Phaco/IOL phacoemulsification with intraocular lens placement; [place] photorefractive keratectomy; LASIK laser assisted in situ keratomileusis; HTN hypertension; DM diabetes mellitus; COPD chronic obstructive pulmonary disease

## 2019-08-25 ENCOUNTER — Other Ambulatory Visit: Payer: Self-pay

## 2019-08-25 ENCOUNTER — Encounter: Payer: Self-pay | Admitting: Family Medicine

## 2019-08-25 MED ORDER — METFORMIN HCL 500 MG PO TABS: 500.0000 mg | ORAL_TABLET | Freq: Every day | ORAL | 1 refills | Status: DC

## 2019-09-07 ENCOUNTER — Telehealth: Payer: Self-pay | Admitting: *Deleted

## 2019-09-07 NOTE — Telephone Encounter (Signed)
Schedule AWV.  

## 2019-09-14 ENCOUNTER — Other Ambulatory Visit: Payer: Self-pay | Admitting: Family Medicine

## 2019-09-14 ENCOUNTER — Encounter (INDEPENDENT_AMBULATORY_CARE_PROVIDER_SITE_OTHER): Payer: Medicare Other | Admitting: Ophthalmology

## 2019-09-14 DIAGNOSIS — E119 Type 2 diabetes mellitus without complications: Secondary | ICD-10-CM

## 2019-09-14 MED ORDER — METFORMIN HCL 500 MG PO TABS: 500.0000 mg | ORAL_TABLET | Freq: Two times a day (BID) | ORAL | 1 refills | Status: DC

## 2019-09-14 NOTE — Telephone Encounter (Signed)
Per initial request, also see MyChart note dated 08/25/19; the pt sees Dr Nyoka Cowden, [place]; will route to office for final disposition:  Patient 2nd request for metFORMIN (GLUCOPHAGE) to reflect 2x daily (reference mychart message sent on 08/25/2019) patient would like a follow up call today when new script reflecting 2x daily is sent  [place], [place] Phone:  [phone]  Fax:  [phone]

## 2019-09-14 NOTE — Telephone Encounter (Signed)
Patient 2nd request for metFORMIN (GLUCOPHAGE) to reflect 2x daily (reference mychart message sent on 08/25/2019) patient would like a follow up call today when new script reflecting 2x daily is sent  [place], [place] Phone:  [phone]  Fax:  [phone]

## 2019-09-14 NOTE — Addendum Note (Signed)
Addended by: Anastasio Auerbach R on: 09/14/2019 09:53 AM   Modules accepted: Orders

## 2019-09-15 ENCOUNTER — Encounter (INDEPENDENT_AMBULATORY_CARE_PROVIDER_SITE_OTHER): Payer: Self-pay | Admitting: Ophthalmology

## 2019-09-15 ENCOUNTER — Ambulatory Visit (INDEPENDENT_AMBULATORY_CARE_PROVIDER_SITE_OTHER): Payer: Medicare Other | Admitting: Ophthalmology

## 2019-09-15 ENCOUNTER — Other Ambulatory Visit: Payer: Self-pay

## 2019-09-15 DIAGNOSIS — H353221 Exudative age-related macular degeneration, left eye, with active choroidal neovascularization: Secondary | ICD-10-CM

## 2019-09-15 MED ORDER — AFLIBERCEPT 2MG/0.05ML IZ SOLN FOR KALEIDOSCOPE
2.0000 mg | INTRAVITREAL | Status: AC | PRN
  Administered 2019-09-15: 2 mg via INTRAVITREAL

## 2019-09-15 NOTE — Assessment & Plan Note (Signed)
Subretinal fluid OS continues, on Eylea injection OS every 5 weeks, will repeat today  Additionally, stay on CPAP usage to maximize macular oxygenation

## 2019-09-15 NOTE — Progress Notes (Signed)
09/15/2019     CHIEF COMPLAINT Patient presents for Retina Follow Up   HISTORY OF PRESENT ILLNESS: [name] is a 74 y.o. female who presents to the clinic today for:   HPI    Retina Follow Up    Patient presents with  Wet AMD.  In both eyes.  Duration of 5 weeks.  Since onset it is stable.          Comments    5 week follow up -  OCT OU, Poss Eylea OS, FFA L/R, FP OU Patient denies change in vision and overall has no complaints.        Last edited by Gerda Diss on 09/15/2019  8:07 AM. (History)      Referring physician: Wendie Agreste, MD [address] [place],  [place] [address]  HISTORICAL INFORMATION:   Selected notes from the MEDICAL RECORD NUMBER    Lab Results  Component Value Date   HGBA1C 7.9 (H) 07/22/2019     CURRENT MEDICATIONS: Current Outpatient Medications (Ophthalmic Drugs)  Medication Sig  . Brimonidine Tartrate-Timolol (COMBIGAN OP) Apply to eye.   No current facility-administered medications for this visit. (Ophthalmic Drugs)   Current Outpatient Medications (Other)  Medication Sig  . aspirin 81 MG tablet Take 81 mg by mouth daily.  . cholecalciferol (VITAMIN D) 1000 units tablet Take 1,000 Units by mouth daily.  . clotrimazole (LOTRIMIN) 1 % cream Apply 1 application topically 2 (two) times daily. To skin between toes. (Patient not taking: Reported on 07/30/2019)  . Cyanocobalamin (VITAMIN B-12 CR PO) Take 500 mcg by mouth daily.   Marland Kitchen ezetimibe (ZETIA) 10 MG tablet Take 1 tablet (10 mg total) by mouth daily after supper.  . fluticasone (FLONASE) 50 MCG/ACT nasal spray Place 1 spray into both nostrils daily. (Patient not taking: Reported on 07/30/2019)  . fluticasone (FLOVENT HFA) 44 MCG/ACT inhaler Inhale 2 puffs into the lungs 2 (two) times daily. With spacer. (Patient not taking: Reported on 07/30/2019)  . lisinopril-hydrochlorothiazide (ZESTORETIC) 10-12.5 MG tablet Take 1 tablet by mouth daily.  Marland Kitchen loratadine (CLARITIN) 10 MG  tablet Take 1 tablet (10 mg total) by mouth daily.  . metFORMIN (GLUCOPHAGE) 500 MG tablet Take 1 tablet (500 mg total) by mouth in the morning and at bedtime.  . metoprolol succinate (TOPROL-XL) 25 MG 24 hr tablet Take 1 tablet (25 mg total) by mouth daily.  . pravastatin (PRAVACHOL) 20 MG tablet Take 20 mg by mouth every other day.  . tamoxifen (NOLVADEX) 10 MG tablet Take 1 tablet (10 mg total) by mouth daily.  . Turmeric Curcumin 500 MG CAPS Take by mouth.  . vitamin C (ASCORBIC ACID) 500 MG tablet Take 500 mg by mouth 2 (two) times daily.   . Zinc 50 MG TABS    Current Facility-Administered Medications (Other)  Medication Route  . EPINEPHrine (Anaphylaxis) SOLN 0.3 mg Intramuscular      REVIEW OF SYSTEMS:    ALLERGIES Allergies  Allergen Reactions  . Peanut (Diagnostic) Cough, Hypertension and Shortness Of Breath  . Adhesive [Tape] Rash  . Crestor [Rosuvastatin] Other (See Comments)    Severe arthralgia and myalgia  . Latex Itching and Other (See Comments)    Causes blisters  . Lovastatin Other (See Comments)    Arthralgias, hyperglycemia with lovastatin.    PAST MEDICAL HISTORY Past Medical History:  Diagnosis Date  . Allergic rhinitis 03/23/2013  . Allergy   . Anemia    due to vaginal bleeding  .  Arthritis    hands  . Asthmatic bronchitis 09/03/2017  . Breast cancer ([place]) 08/2012   left  . Dental crowns present   . Eczema   . Fibroids   . Headache(784.0)    tension  . History of endometriosis   . Hyperlipidemia   . Hypertension    under control with med., has been on med. x 3 yr.  . OSA (obstructive sleep apnea)   . Postmenopausal bleeding   . Recurrent upper respiratory infection (URI)   . S/P radiation therapy 10/20/2012-12/05/2012   left breast 50.4 gray, lumpectomy cavity boosted to 62.4 gray  . Seasonal allergies   . Sleep apnea    uses CPAP nightly  . Urinary urgency   . White coat hypertension    Past Surgical History:  Procedure Laterality  Date  . BACK SURGERY  1997   lumbar  . BREAST LUMPECTOMY WITH NEEDLE LOCALIZATION AND AXILLARY SENTINEL LYMPH NODE BX Left 09/03/2012   Procedure: BREAST LUMPECTOMY WITH NEEDLE LOCALIZATION AND AXILLARY SENTINEL LYMPH NODE BX;  Surgeon: Edward Jolly, MD;  Location: [place];  Service: General;  Laterality: Left;  . CHOLECYSTECTOMY  1995  . COLONOSCOPY W/ POLYPECTOMY  03/20/2007  . DILATION AND CURETTAGE OF UTERUS  2005  . DILATION AND CURETTAGE OF UTERUS  06/22/2011   Procedure: DILATATION AND CURETTAGE;  Surgeon: Alvino Chapel, MD;  [facility];  Service: Gynecology;  Laterality: N/A;  OK PER KEELA FOR 7:15 START  . DORSAL COMPARTMENT RELEASE Right 05/19/2013   Procedure: RELEASE 1ST  DORSAL COMPARTMENT RIGHT (DEQUERVAIN);  Surgeon: Cammie Sickle., MD;  Location[facility];  Service: Orthopedics;  Laterality: Right;  . EXPLORATORY LAPAROTOMY  06-30-2007   ATTEMPTED HYSTERECTOMY ABORTED DUE TO EXTENSIVE ENDOMETRIOSIS W/ DENSE PELVIC ADHESIONS INVOLVING UTERUS AND LOWER RECTOSIGMOID  . HYSTEROSCOPY WITH D & C  05/06/2009    FAMILY HISTORY Family History  Problem Relation Age of Onset  . Vision loss Mother   . Hypertension Mother   . Anesthesia problems Mother        post-op N/V  . Heart disease Mother        carotid stenosis s/p CAE  . Osteoporosis Mother   . Eczema Mother   . Depression Father   . Heart disease Sister 18       arrhythmia  . Hypertension Sister   . Hyperlipidemia Sister   . Diabetes Brother   . Hypertension Brother   . Cancer Brother        skin  . Hyperlipidemia Brother   . Stroke Brother   . COPD Daughter   . Atopy Daughter   . Cancer Cousin   . Kidney failure Maternal Grandmother   . Heart disease Maternal Grandfather   . Hypertension Other   . Colon cancer Neg Hx   . Allergic rhinitis Neg Hx   . Asthma Neg Hx   . Urticaria Neg Hx     SOCIAL HISTORY Social History   Tobacco  Use  . Smoking status: Never Smoker  . Smokeless tobacco: Never Used  Vaping Use  . Vaping Use: Never used  Substance Use Topics  . Alcohol use: No    Alcohol/week: 0.0 standard drinks  . Drug use: No         OPHTHALMIC EXAM:  Base Eye Exam    Visual Acuity (Snellen - Linear)      Right Left   Dist cc 20/25-2 20/30+2  Correction: Glasses       Tonometry (Tonopen, 8:14 AM)      Right Left   Pressure 13 13       Pupils      Pupils Dark Light Shape React APD   Right PERRL 4 3 Round Slow None   Left PERRL 4 3 Round Slow None       Visual Fields (Counting fingers)      Left Right    Full Full       Extraocular Movement      Right Left    Full Full       Neuro/Psych    Oriented x3: Yes   Mood/Affect: Normal       Dilation    Both eyes: 1.0% Mydriacyl, 2.5% Phenylephrine @ 8:14 AM        Slit Lamp and Fundus Exam    External Exam      Right Left   External Normal Normal       Slit Lamp Exam      Right Left   Lids/Lashes Normal Normal   Conjunctiva/Sclera White and quiet White and quiet   Cornea Clear Clear   Anterior Chamber Deep and quiet Deep and quiet   Iris Round and reactive Round and reactive   Anterior Vitreous Normal Normal       Fundus Exam      Right Left   Posterior Vitreous  Normal   Disc  Normal   C/D Ratio  0.25   Macula  Retinal pigment epithelial mottling, no exudates, no hemorrhage, no macular thickening, Early age related macular degeneration, Pigmented atrophy   Vessels  Normal   Periphery  Normal          IMAGING AND PROCEDURES  Imaging and Procedures for 09/15/19  OCT, Retina - OU - Both Eyes       Right Eye Quality was good. Scan locations included subfoveal. Central Foveal Thickness: 240. Progression has been stable.   Left Eye Quality was good. Scan locations included subfoveal. Central Foveal Thickness: 254. Progression has been stable. Findings include subretinal fluid.   Notes Subretinal fluid OS  continues, on Eylea injection OS every 5 weeks, will repeat today       Intravitreal Injection, Pharmacologic Agent - OS - Left Eye       Time Out 09/15/2019. 8:44 AM. Confirmed correct patient, procedure, site, and patient consented.   Anesthesia Topical anesthesia was used. Anesthetic medications included Akten 3.5%.   Procedure Preparation included Ofloxacin , 10% betadine to eyelids, 5% betadine to ocular surface. A 30 gauge needle was used.   Injection:  2 mg aflibercept Alfonse Flavors) SOLN   NDC: [id number], Lot: [id number]   Route: Intravitreal, Site: Left Eye, Waste: 0 mg  Post-op Post injection exam found visual acuity of at least counting fingers. The patient tolerated the procedure well. There were no complications. The patient received written and verbal post procedure care education. Post injection medications were not given.                 ASSESSMENT/PLAN:  Exudative age-related macular degeneration of left eye with active choroidal neovascularization (HCC) Subretinal fluid OS continues, on Eylea injection OS every 5 weeks, will repeat today  Additionally, stay on CPAP usage to maximize macular oxygenation      ICD-10-CM   1. Exudative age-related macular degeneration of left eye with active choroidal neovascularization (HCC)  H35.3221 OCT, Retina - OU - Both Eyes  Intravitreal Injection, Pharmacologic Agent - OS - Left Eye    aflibercept (EYLEA) SOLN 2 mg    1.  2.  3.  Ophthalmic Meds Ordered this visit:  Meds ordered this encounter  Medications  . aflibercept (EYLEA) SOLN 2 mg       Return in about 5 weeks (around 10/20/2019) for dilate, OS, EYLEA OCT.  There are no Patient Instructions on file for this visit.   Explained the diagnoses, plan, and follow up with the patient and they expressed understanding.  Patient expressed understanding of the importance of proper follow up care.   Clent Demark Jeilyn Reznik M.D. Diseases & Surgery of the Retina  and Vitreous Retina & Diabetic [place] 09/15/19     Abbreviations: M myopia (nearsighted); A astigmatism; H hyperopia (farsighted); P presbyopia; Mrx spectacle prescription;  CTL contact lenses; OD right eye; OS left eye; OU both eyes  XT exotropia; ET esotropia; PEK punctate epithelial keratitis; PEE punctate epithelial erosions; DES dry eye syndrome; MGD meibomian gland dysfunction; ATs artificial tears; PFAT's preservative free artificial tears; [place] nuclear sclerotic cataract; PSC posterior subcapsular cataract; ERM epi-retinal membrane; PVD posterior vitreous detachment; RD retinal detachment; DM diabetes mellitus; DR diabetic retinopathy; NPDR non-proliferative diabetic retinopathy; PDR proliferative diabetic retinopathy; CSME clinically significant macular edema; DME diabetic macular edema; dbh dot blot hemorrhages; CWS cotton wool spot; POAG primary open angle glaucoma; C/D cup-to-disc ratio; HVF humphrey visual field; GVF goldmann visual field; OCT optical coherence tomography; IOP intraocular pressure; BRVO Branch retinal vein occlusion; CRVO central retinal vein occlusion; CRAO central retinal artery occlusion; BRAO branch retinal artery occlusion; RT retinal tear; SB scleral buckle; PPV pars plana vitrectomy; VH Vitreous hemorrhage; PRP panretinal laser photocoagulation; IVK intravitreal kenalog; VMT vitreomacular traction; MH Macular hole;  NVD neovascularization of the disc; NVE neovascularization elsewhere; AREDS age related eye disease study; ARMD age related macular degeneration; POAG primary open angle glaucoma; EBMD epithelial/anterior basement membrane dystrophy; ACIOL anterior chamber intraocular lens; IOL intraocular lens; PCIOL posterior chamber intraocular lens; Phaco/IOL phacoemulsification with intraocular lens placement; [place] photorefractive keratectomy; LASIK laser assisted in situ keratomileusis; HTN hypertension; DM diabetes mellitus; COPD chronic obstructive pulmonary  disease

## 2019-10-19 ENCOUNTER — Ambulatory Visit (INDEPENDENT_AMBULATORY_CARE_PROVIDER_SITE_OTHER): Payer: Medicare Other | Admitting: Family Medicine

## 2019-10-19 VITALS — BP 120/70 | Ht 61.0 in | Wt 240.0 lb

## 2019-10-19 DIAGNOSIS — Z0000 Encounter for general adult medical examination without abnormal findings: Secondary | ICD-10-CM | POA: Diagnosis not present

## 2019-10-19 NOTE — Progress Notes (Signed)
Presents today for [employer] Visit   Date of last exam: 07-22-2019  Interpreter used for this visit? No  I connected with  [name] on 10/19/19 by a  Telephone and verified that I am speaking with the correct person using two identifiers.   I discussed the limitations of evaluation and management by telemedicine. The patient expressed understanding and agreed to proceed.  Patient location: home  Provider location: in office  I provided 20 minutes of non face - to - face time during this encounter.   Patient Care Team: Wendie Agreste, MD as PCP - General (Family Medicine) [name], MD (Family Medicine)   Other items to address today:  Discussed Eye/Dental Discussed Immunizations    Other Screening: Last screening for diabetes: 07/22/2019 Last lipid screening: 07/01/2019    Immunization status:  Immunization History  Administered Date(s) Administered  . Influenza Split 12/28/2011, 02/23/2013, 02/05/2014  . Influenza, High Dose Seasonal PF 01/29/2018  . Influenza,inj,Quad PF,6+ Mos 05/31/2015  . Influenza-Unspecified 02/12/2017, 12/23/2018  . PFIZER SARS-COV-2 Vaccination 05/21/2019, 06/09/2019  . Pneumococcal Conjugate-13 11/16/2013  . Pneumococcal Polysaccharide-23 02/20/2010, 04/15/2017  . Tdap 11/17/2005  . Zoster 02/16/2010     Health Maintenance Due  Topic Date Due  . INFLUENZA VACCINE  10/18/2019     Functional Status Survey: Is the patient deaf or have difficulty hearing?: No Does the patient have difficulty seeing, even when wearing glasses/contacts?: No Does the patient have difficulty concentrating, remembering, or making decisions?: No Does the patient have difficulty walking or climbing stairs?: Yes Does the patient have difficulty dressing or bathing?: No Does the patient have difficulty doing errands alone such as visiting a doctor's office or shopping?: No   6CIT Screen 10/19/2019 12/07/2016  What Year? 0  points 0 points  What month? 0 points 0 points  What time? 0 points 0 points  Count back from 20 0 points 0 points  Months in reverse 0 points 0 points  Repeat phrase 0 points 0 points  Total Score 0 0        Clinical Support from 10/19/2019 in [facility]  AUDIT-C Score 0       Home Environment:    Lives in a one story home Yes  Trouble climbing stairsknee pain  No grab bars Adequate lighting/ no clutter No scattered rugs  Patient Active Problem List   Diagnosis Date Noted  . Class 3 severe obesity due to excess calories with serious comorbidity and body mass index (BMI) of 40.0 to 44.9 in adult [facility]) 07/09/2019  . OSA on CPAP 07/09/2019  . Glaucoma of right eye 07/09/2019  . Exudative age-related macular degeneration of left eye with active choroidal neovascularization ([place]) 07/06/2019  . Serous detachment of retinal pigment epithelium of left eye 07/06/2019  . Intermediate stage nonexudative age-related macular degeneration of both eyes 07/06/2019  . Degenerative retinal drusen of left eye 07/06/2019  . Macular degeneration 03/25/2019  . Glaucoma 03/25/2019  . History of food allergy 10/22/2017  . Anaphylaxis 10/22/2017  . Food allergy 09/03/2017  . Mild persistent asthma 09/03/2017  . Spinal stenosis of lumbar region with neurogenic claudication 08/01/2016  . Pure hypercholesterolemia 08/01/2016  . Osteopenia 10/18/2014  . Obesity (BMI 30-39.9) 06/22/2014  . Perennial allergic rhinitis 03/23/2013  . Cancer of central portion of left female breast ([place]) 08/25/2012  . Obstructive sleep apnea on CPAP 04/21/2012  . Diabetes ([place]) 12/28/2011  . HTN (hypertension) 12/28/2011  .  Uterine bleeding 04/30/2011     Past Medical History:  Diagnosis Date  . Allergic rhinitis 03/23/2013  . Allergy   . Anemia    due to vaginal bleeding  . Arthritis    hands  . Asthmatic bronchitis 09/03/2017  . Breast cancer ([place]) 08/2012   left  . Dental crowns present   .  Eczema   . Fibroids   . Headache(784.0)    tension  . History of endometriosis   . Hyperlipidemia   . Hypertension    under control with med., has been on med. x 3 yr.  . OSA (obstructive sleep apnea)   . Postmenopausal bleeding   . Recurrent upper respiratory infection (URI)   . S/P radiation therapy 10/20/2012-12/05/2012   left breast 50.4 gray, lumpectomy cavity boosted to 62.4 gray  . Seasonal allergies   . Sleep apnea    uses CPAP nightly  . Urinary urgency   . White coat hypertension      Past Surgical History:  Procedure Laterality Date  . BACK SURGERY  1997   lumbar  . BREAST LUMPECTOMY WITH NEEDLE LOCALIZATION AND AXILLARY SENTINEL LYMPH NODE BX Left 09/03/2012   Procedure: BREAST LUMPECTOMY WITH NEEDLE LOCALIZATION AND AXILLARY SENTINEL LYMPH NODE BX;  Surgeon: Edward Jolly, MD;  Location: [place];  Service: General;  Laterality: Left;  . CHOLECYSTECTOMY  1995  . COLONOSCOPY W/ POLYPECTOMY  03/20/2007  . DILATION AND CURETTAGE OF UTERUS  2005  . DILATION AND CURETTAGE OF UTERUS  06/22/2011   Procedure: DILATATION AND CURETTAGE;  Surgeon: Alvino Chapel, MD;  [facility];  Service: Gynecology;  Laterality: N/A;  OK PER KEELA FOR 7:15 START  . DORSAL COMPARTMENT RELEASE Right 05/19/2013   Procedure: RELEASE 1ST  DORSAL COMPARTMENT RIGHT (DEQUERVAIN);  Surgeon: Cammie Sickle., MD;  Location[facility];  Service: Orthopedics;  Laterality: Right;  . EXPLORATORY LAPAROTOMY  06-30-2007   ATTEMPTED HYSTERECTOMY ABORTED DUE TO EXTENSIVE ENDOMETRIOSIS W/ DENSE PELVIC ADHESIONS INVOLVING UTERUS AND LOWER RECTOSIGMOID  . HYSTEROSCOPY WITH D & C  05/06/2009     Family History  Problem Relation Age of Onset  . Vision loss Mother   . Hypertension Mother   . Anesthesia problems Mother        post-op N/V  . Heart disease Mother        carotid stenosis s/p CAE  . Osteoporosis Mother   . Eczema Mother   .  Depression Father   . Heart disease Sister 18       arrhythmia  . Hypertension Sister   . Hyperlipidemia Sister   . Diabetes Brother   . Hypertension Brother   . Cancer Brother        skin  . Hyperlipidemia Brother   . Stroke Brother   . COPD Daughter   . Atopy Daughter   . Cancer Cousin   . Kidney failure Maternal Grandmother   . Heart disease Maternal Grandfather   . Hypertension Other   . Colon cancer Neg Hx   . Allergic rhinitis Neg Hx   . Asthma Neg Hx   . Urticaria Neg Hx      Social History   Socioeconomic History  . Marital status: Married    Spouse name: [name]  . Number of children: 4  . Years of education: College  . Highest education level: Not on file  Occupational History  . Occupation: retired  Tobacco Use  .  Smoking status: Never Smoker  . Smokeless tobacco: Never Used  Vaping Use  . Vaping Use: Never used  Substance and Sexual Activity  . Alcohol use: No    Alcohol/week: 0.0 standard drinks  . Drug use: No  . Sexual activity: Yes    Comment: menarche 67, P4, Premarin for short while, Provera x 3 years  Other Topics Concern  . Not on file  Social History Narrative   Marital status: married x 71  years; happily married; no abuse.      Children: 4 children (2 sons living; 1 son died in 2355 at age 1years old, 1 daughter); six grandchildren; no gg.      Lives:  With husband.      Employed: retired in 2011; [employer].      Tobacco: none       Alcohol:  None      Drugs: none      Exercise:  Rowing 3 days per week.      Seatbelt:  100%      Sunscreen:  Face SPF 15.      Guns:  Loaded mostly secured.      ADLs:  Drives minimally; no assistant devices; independent with all ADLs.       Advanced Directives: YES; FULL CODE but no prolonged measures.      Her nocturia is improved under CPAP use at 10 cm water- AHI is now 0.8 and from 30 at baseline. CMS compliance  6 hours 40 minutes - sleeps on the side, 08-10-11 .FFM - likes  it.    . Flonase used prn.     Caffeine Use: 1 cup daily   Social Determinants of Health   Financial Resource Strain:   . Difficulty of Paying Living Expenses:   Food Insecurity:   . Worried About Charity fundraiser in the Last Year:   . Arboriculturist in the Last Year:   Transportation Needs:   . Film/video editor (Medical):   Marland Kitchen Lack of Transportation (Non-Medical):   Physical Activity:   . Days of Exercise per Week:   . Minutes of Exercise per Session:   Stress:   . Feeling of Stress :   Social Connections:   . Frequency of Communication with Friends and Family:   . Frequency of Social Gatherings with Friends and Family:   . Attends Religious Services:   . Active Member of Clubs or Organizations:   . Attends Archivist Meetings:   Marland Kitchen Marital Status:   Intimate Partner Violence:   . Fear of Current or Ex-Partner:   . Emotionally Abused:   Marland Kitchen Physically Abused:   . Sexually Abused:      Allergies  Allergen Reactions  . Peanut (Diagnostic) Cough, Hypertension and Shortness Of Breath  . Adhesive [Tape] Rash  . Crestor [Rosuvastatin] Other (See Comments)    Severe arthralgia and myalgia  . Latex Itching and Other (See Comments)    Causes blisters  . Lovastatin Other (See Comments)    Arthralgias, hyperglycemia with lovastatin.     Prior to Admission medications   Medication Sig Start Date End Date Taking? Authorizing Provider  aspirin 81 MG tablet Take 81 mg by mouth daily.   Yes [provider]  Brimonidine Tartrate-Timolol (COMBIGAN OP) Apply to eye.   Yes [provider]  cholecalciferol (VITAMIN D) 1000 units tablet Take 1,000 Units by mouth daily.   Yes [provider]  Cyanocobalamin (VITAMIN  B-12 CR PO) Take 500 mcg by mouth daily.    Yes [provider]  ezetimibe (ZETIA) 10 MG tablet Take 1 tablet (10 mg total) by mouth daily after supper. 07/30/19 10/28/19 Yes Adrian Prows, MD  lisinopril-hydrochlorothiazide  (ZESTORETIC) 10-12.5 MG tablet Take 1 tablet by mouth daily. 03/25/19  Yes Wendie Agreste, MD  loratadine (CLARITIN) 10 MG tablet Take 1 tablet (10 mg total) by mouth daily. 05/12/18  Yes Nicholas Lose, MD  metFORMIN (GLUCOPHAGE) 500 MG tablet Take 1 tablet (500 mg total) by mouth in the morning and at bedtime. 09/14/19  Yes Wendie Agreste, MD  metoprolol succinate (TOPROL-XL) 25 MG 24 hr tablet Take 1 tablet (25 mg total) by mouth daily. 04/02/19  Yes Miquel Dunn, NP  pravastatin (PRAVACHOL) 20 MG tablet Take 20 mg by mouth every other day.   Yes [provider]  tamoxifen (NOLVADEX) 10 MG tablet Take 1 tablet (10 mg total) by mouth daily. 05/13/19  Yes Nicholas Lose, MD  Turmeric Curcumin 500 MG CAPS Take by mouth.   Yes [provider]  vitamin C (ASCORBIC ACID) 500 MG tablet Take 500 mg by mouth 2 (two) times daily.    Yes [provider]  Zinc 50 MG TABS  12/17/17  Yes [provider]  clotrimazole (LOTRIMIN) 1 % cream Apply 1 application topically 2 (two) times daily. To skin between toes. Patient not taking: Reported on 07/30/2019 03/25/19   Wendie Agreste, MD  fluticasone [facility]) 50 MCG/ACT nasal spray Place 1 spray into both nostrils daily. Patient not taking: Reported on 07/30/2019 09/03/17   Adelina Mings, MD  fluticasone (FLOVENT HFA) 44 MCG/ACT inhaler Inhale 2 puffs into the lungs 2 (two) times daily. With spacer. Patient not taking: Reported on 07/30/2019 10/22/17   Adelina Mings, MD  LISINOPRIL PO Take 1 tablet by mouth daily.  06/13/11  [provider]     Depression screen [facility] 2/9 10/19/2019 07/22/2019 04/15/2019 03/25/2019 09/09/2018  Decreased Interest 0 0 0 0 0  Down, Depressed, Hopeless 0 0 0 0 0  PHQ - 2 Score 0 0 0 0 0  Some recent data might be hidden     Fall Risk  10/19/2019 07/22/2019 04/15/2019 03/25/2019 09/09/2018  Falls in the past year? 0 1 0 0 0  Number falls in past yr: 0 0 0 0 0  Injury with Fall? 0 0  0 0 0  Follow up Falls evaluation completed;Education provided Falls evaluation completed - Falls evaluation completed Falls evaluation completed      PHYSICAL EXAM: BP 120/70 Comment: Taken from a previous visit/not in clinic  Ht 5\' 1"  (1.549 m)   Wt (!) 240 lb (108.9 kg)   BMI 45.35 kg/m    Wt Readings from Last 3 Encounters:  10/19/19 (!) 240 lb (108.9 kg)  07/30/19 245 lb (111.1 kg)  07/22/19 232 lb (105.2 kg)      Education/Counseling provided regarding diet and exercise, prevention of chronic diseases, smoking/tobacco cessation, if applicable, and reviewed "Covered Medicare Preventive Services."

## 2019-10-19 NOTE — Patient Instructions (Addendum)
Thank you for taking time to come for your Medicare Wellness Visit. I appreciate your ongoing commitment to your health goals. Please review the following plan we discussed and let me know if I can assist you in the future.  Chinenye Katzenberger LPN  Preventive Care 75 Years and Older, Female Preventive care refers to lifestyle choices and visits with your health care provider that can promote health and wellness. This includes:  A yearly physical exam. This is also called an annual well check.  Regular dental and eye exams.  Immunizations.  Screening for certain conditions.  Healthy lifestyle choices, such as diet and exercise. What can I expect for my preventive care visit? Physical exam Your health care provider will check:  Height and weight. These may be used to calculate body mass index (BMI), which is a measurement that tells if you are at a healthy weight.  Heart rate and blood pressure.  Your skin for abnormal spots. Counseling Your health care provider may ask you questions about:  Alcohol, tobacco, and drug use.  Emotional well-being.  Home and relationship well-being.  Sexual activity.  Eating habits.  History of falls.  Memory and ability to understand (cognition).  Work and work environment.  Pregnancy and menstrual history. What immunizations do I need?  Influenza (flu) vaccine  This is recommended every year. Tetanus, diphtheria, and pertussis (Tdap) vaccine  You may need a Td booster every 10 years. Varicella (chickenpox) vaccine  You may need this vaccine if you have not already been vaccinated. Zoster (shingles) vaccine  You may need this after age 75. Pneumococcal conjugate (PCV13) vaccine  One dose is recommended after age 65. Pneumococcal polysaccharide (PPSV23) vaccine  One dose is recommended after age 65. Measles, mumps, and rubella (MMR) vaccine  You may need at least one dose of MMR if you were born in 1957 or later. You may also  need a second dose. Meningococcal conjugate (MenACWY) vaccine  You may need this if you have certain conditions. Hepatitis A vaccine  You may need this if you have certain conditions or if you travel or work in places where you may be exposed to hepatitis A. Hepatitis B vaccine  You may need this if you have certain conditions or if you travel or work in places where you may be exposed to hepatitis B. Haemophilus influenzae type b (Hib) vaccine  You may need this if you have certain conditions. You may receive vaccines as individual doses or as more than one vaccine together in one shot (combination vaccines). Talk with your health care provider about the risks and benefits of combination vaccines. What tests do I need? Blood tests  Lipid and cholesterol levels. These may be checked every 5 years, or more frequently depending on your overall health.  Hepatitis C test.  Hepatitis B test. Screening  Lung cancer screening. You may have this screening every year starting at age 55 if you have a 30-pack-year history of smoking and currently smoke or have quit within the past 15 years.  Colorectal cancer screening. All adults should have this screening starting at age 50 and continuing until age 75. Your health care provider may recommend screening at age 45 if you are at increased risk. You will have tests every 1-10 years, depending on your results and the type of screening test.  Diabetes screening. This is done by checking your blood sugar (glucose) after you have not eaten for a while (fasting). You may have this done every 1-3   years.  Mammogram. This may be done every 1-2 years. Talk with your health care provider about how often you should have regular mammograms.  BRCA-related cancer screening. This may be done if you have a family history of breast, ovarian, tubal, or peritoneal cancers. Other tests  Sexually transmitted disease (STD) testing.  Bone density scan. This is done  to screen for osteoporosis. You may have this done starting at age 75. Follow these instructions at home: Eating and drinking  Eat a diet that includes fresh fruits and vegetables, whole grains, lean protein, and low-fat dairy products. Limit your intake of foods with high amounts of sugar, saturated fats, and salt.  Take vitamin and mineral supplements as recommended by your health care provider.  Do not drink alcohol if your health care provider tells you not to drink.  If you drink alcohol: ? Limit how much you have to 0-1 drink a day. ? Be aware of how much alcohol is in your drink. In the [place] one drink equals one 12 oz bottle of beer (355 mL), one 5 oz glass of wine (148 mL), or one 1 oz glass of hard liquor (44 mL). Lifestyle  Take daily care of your teeth and gums.  Stay active. Exercise for at least 30 minutes on 5 or more days each week.  Do not use any products that contain nicotine or tobacco, such as cigarettes, e-cigarettes, and chewing tobacco. If you need help quitting, ask your health care provider.  If you are sexually active, practice safe sex. Use a condom or other form of protection in order to prevent STIs (sexually transmitted infections).  Talk with your health care provider about taking a low-dose aspirin or statin. What's next?  Go to your health care provider once a year for a well check visit.  Ask your health care provider how often you should have your eyes and teeth checked.  Stay up to date on all vaccines. This information is not intended to replace advice given to you by your health care provider. Make sure you discuss any questions you have with your health care provider. Document Revised: 02/27/2018 Document Reviewed: 02/27/2018 Elsevier Patient Education  2020 [employer].

## 2019-10-21 ENCOUNTER — Other Ambulatory Visit: Payer: Self-pay

## 2019-10-21 ENCOUNTER — Encounter (INDEPENDENT_AMBULATORY_CARE_PROVIDER_SITE_OTHER): Payer: Self-pay | Admitting: Ophthalmology

## 2019-10-21 ENCOUNTER — Ambulatory Visit (INDEPENDENT_AMBULATORY_CARE_PROVIDER_SITE_OTHER): Payer: Medicare Other | Admitting: Ophthalmology

## 2019-10-21 DIAGNOSIS — H353221 Exudative age-related macular degeneration, left eye, with active choroidal neovascularization: Secondary | ICD-10-CM

## 2019-10-21 MED ORDER — AFLIBERCEPT 2MG/0.05ML IZ SOLN FOR KALEIDOSCOPE
2.0000 mg | INTRAVITREAL | Status: AC | PRN
  Administered 2019-10-21: 2 mg via INTRAVITREAL

## 2019-10-21 NOTE — Assessment & Plan Note (Signed)
OS, increased SRF, post change to [facility] April 2021, will repeat injection today and if increase fluid continues or persists, will upon follow-up in 5 weeks change agent back to Avastin

## 2019-10-21 NOTE — Progress Notes (Signed)
10/21/2019     CHIEF COMPLAINT Patient presents for Retina Follow Up   HISTORY OF PRESENT ILLNESS: [name] is a 74 y.o. female who presents to the clinic today for:   HPI    Retina Follow Up    Patient presents with  Wet AMD.  In left eye.  Severity is moderate.  Duration of 5 weeks.  Since onset it is stable.  I, the attending physician,  performed the HPI with the patient and updated documentation appropriately.          Comments    5 Week Wet AMD f\u OS. Possible Eylea OS. OCT  Pt states vision is about the same. Denies any complaints. BGL: did not check       Last edited by Tilda Franco on 10/21/2019  8:15 AM. (History)      Referring physician: Wendie Agreste, MD [address] [place],  [place] [address]  HISTORICAL INFORMATION:   Selected notes from the MEDICAL RECORD NUMBER    Lab Results  Component Value Date   HGBA1C 7.9 (H) 07/22/2019     CURRENT MEDICATIONS: Current Outpatient Medications (Ophthalmic Drugs)  Medication Sig  . Brimonidine Tartrate-Timolol (COMBIGAN OP) Apply to eye.   No current facility-administered medications for this visit. (Ophthalmic Drugs)   Current Outpatient Medications (Other)  Medication Sig  . aspirin 81 MG tablet Take 81 mg by mouth daily.  . cholecalciferol (VITAMIN D) 1000 units tablet Take 1,000 Units by mouth daily.  . clotrimazole (LOTRIMIN) 1 % cream Apply 1 application topically 2 (two) times daily. To skin between toes. (Patient not taking: Reported on 07/30/2019)  . Cyanocobalamin (VITAMIN B-12 CR PO) Take 500 mcg by mouth daily.   Marland Kitchen ezetimibe (ZETIA) 10 MG tablet Take 1 tablet (10 mg total) by mouth daily after supper.  . fluticasone (FLONASE) 50 MCG/ACT nasal spray Place 1 spray into both nostrils daily. (Patient not taking: Reported on 07/30/2019)  . fluticasone (FLOVENT HFA) 44 MCG/ACT inhaler Inhale 2 puffs into the lungs 2 (two) times daily. With spacer. (Patient not taking: Reported on  07/30/2019)  . lisinopril-hydrochlorothiazide (ZESTORETIC) 10-12.5 MG tablet Take 1 tablet by mouth daily.  Marland Kitchen loratadine (CLARITIN) 10 MG tablet Take 1 tablet (10 mg total) by mouth daily.  . metFORMIN (GLUCOPHAGE) 500 MG tablet Take 1 tablet (500 mg total) by mouth in the morning and at bedtime.  . metoprolol succinate (TOPROL-XL) 25 MG 24 hr tablet Take 1 tablet (25 mg total) by mouth daily.  . pravastatin (PRAVACHOL) 20 MG tablet Take 20 mg by mouth every other day.  . tamoxifen (NOLVADEX) 10 MG tablet Take 1 tablet (10 mg total) by mouth daily.  . Turmeric Curcumin 500 MG CAPS Take by mouth.  . vitamin C (ASCORBIC ACID) 500 MG tablet Take 500 mg by mouth 2 (two) times daily.   . Zinc 50 MG TABS    Current Facility-Administered Medications (Other)  Medication Route  . EPINEPHrine (Anaphylaxis) SOLN 0.3 mg Intramuscular      REVIEW OF SYSTEMS:    ALLERGIES Allergies  Allergen Reactions  . Peanut (Diagnostic) Cough, Hypertension and Shortness Of Breath  . Adhesive [Tape] Rash  . Crestor [Rosuvastatin] Other (See Comments)    Severe arthralgia and myalgia  . Latex Itching and Other (See Comments)    Causes blisters  . Lovastatin Other (See Comments)    Arthralgias, hyperglycemia with lovastatin.    PAST MEDICAL HISTORY Past Medical History:  Diagnosis Date  .  Allergic rhinitis 03/23/2013  . Allergy   . Anemia    due to vaginal bleeding  . Arthritis    hands  . Asthmatic bronchitis 09/03/2017  . Breast cancer ([place]) 08/2012   left  . Dental crowns present   . Eczema   . Fibroids   . Headache(784.0)    tension  . History of endometriosis   . Hyperlipidemia   . Hypertension    under control with med., has been on med. x 3 yr.  . OSA (obstructive sleep apnea)   . Postmenopausal bleeding   . Recurrent upper respiratory infection (URI)   . S/P radiation therapy 10/20/2012-12/05/2012   left breast 50.4 gray, lumpectomy cavity boosted to 62.4 gray  . Seasonal allergies     . Sleep apnea    uses CPAP nightly  . Urinary urgency   . White coat hypertension    Past Surgical History:  Procedure Laterality Date  . BACK SURGERY  1997   lumbar  . BREAST LUMPECTOMY WITH NEEDLE LOCALIZATION AND AXILLARY SENTINEL LYMPH NODE BX Left 09/03/2012   Procedure: BREAST LUMPECTOMY WITH NEEDLE LOCALIZATION AND AXILLARY SENTINEL LYMPH NODE BX;  Surgeon: Edward Jolly, MD;  Location: [facility];  Service: General;  Laterality: Left;  . CHOLECYSTECTOMY  1995  . COLONOSCOPY W/ POLYPECTOMY  03/20/2007  . DILATION AND CURETTAGE OF UTERUS  2005  . DILATION AND CURETTAGE OF UTERUS  06/22/2011   Procedure: DILATATION AND CURETTAGE;  Surgeon: Alvino Chapel, MD;  Location[facility];  Service: Gynecology;  Laterality: N/A;  OK PER KEELA FOR 7:15 START  . DORSAL COMPARTMENT RELEASE Right 05/19/2013   Procedure: RELEASE 1ST  DORSAL COMPARTMENT RIGHT (DEQUERVAIN);  Surgeon: Cammie Sickle., MD;  Location: [facility];  Service: Orthopedics;  Laterality: Right;  . EXPLORATORY LAPAROTOMY  06-30-2007   ATTEMPTED HYSTERECTOMY ABORTED DUE TO EXTENSIVE ENDOMETRIOSIS W/ DENSE PELVIC ADHESIONS INVOLVING UTERUS AND LOWER RECTOSIGMOID  . HYSTEROSCOPY WITH D & C  05/06/2009    FAMILY HISTORY Family History  Problem Relation Age of Onset  . Vision loss Mother   . Hypertension Mother   . Anesthesia problems Mother        post-op N/V  . Heart disease Mother        carotid stenosis s/p CAE  . Osteoporosis Mother   . Eczema Mother   . Depression Father   . Heart disease Sister 18       arrhythmia  . Hypertension Sister   . Hyperlipidemia Sister   . Diabetes Brother   . Hypertension Brother   . Cancer Brother        skin  . Hyperlipidemia Brother   . Stroke Brother   . COPD Daughter   . Atopy Daughter   . Cancer Cousin   . Kidney failure Maternal Grandmother   . Heart disease Maternal Grandfather   . Hypertension Other   .  Colon cancer Neg Hx   . Allergic rhinitis Neg Hx   . Asthma Neg Hx   . Urticaria Neg Hx     SOCIAL HISTORY Social History   Tobacco Use  . Smoking status: Never Smoker  . Smokeless tobacco: Never Used  Vaping Use  . Vaping Use: Never used  Substance Use Topics  . Alcohol use: No    Alcohol/week: 0.0 standard drinks  . Drug use: No         OPHTHALMIC EXAM:  Base Eye Exam  Visual Acuity (Snellen - Linear)      Right Left   Dist cc 20/25 -2 20/30 -1       Tonometry (Tonopen, 8:18 AM)      Right Left   Pressure 9 9       Pupils      Pupils Dark Light Shape React APD   Right PERRL 4 3 Round Slow None   Left PERRL 4 3 Round Slow None       Visual Fields (Counting fingers)      Left Right    Full Full       Neuro/Psych    Oriented x3: Yes   Mood/Affect: Normal       Dilation    Left eye: 1.0% Mydriacyl, 2.5% Phenylephrine @ 8:18 AM        Slit Lamp and Fundus Exam    External Exam      Right Left   External Normal Normal       Slit Lamp Exam      Right Left   Lids/Lashes Normal Normal   Conjunctiva/Sclera White and quiet White and quiet   Cornea Clear Clear   Anterior Chamber Deep and quiet Deep and quiet   Iris Round and reactive Round and reactive   Lens  2+ Nuclear sclerosis   Anterior Vitreous Normal Normal       Fundus Exam      Right Left   Posterior Vitreous  Normal   Disc  Normal   C/D Ratio  0.25   Macula  Retinal pigment epithelial mottling, no exudates, no hemorrhage, no macular thickening, Early age related macular degeneration, Pigmented atrophy   Vessels  Normal   Periphery  Normal          IMAGING AND PROCEDURES  Imaging and Procedures for 10/21/19  OCT, Retina - OU - Both Eyes       Right Eye Quality was good. Scan locations included subfoveal. Central Foveal Thickness: 238. Progression has been stable.   Left Eye Quality was good. Scan locations included subfoveal. Central Foveal Thickness: 258. Progression  has worsened. Findings include abnormal foveal contour, subretinal hyper-reflective material, pigment epithelial detachment, subretinal fluid.   Notes OS, increased SRF, post change to [facility] April 2021, will repeat injection today and if increase fluid continues or persists, will upon follow-up in 5 weeks change agent back to Avastin       Intravitreal Injection, Pharmacologic Agent - OS - Left Eye       Time Out 10/21/2019. 9:18 AM. Confirmed correct patient, procedure, site, and patient consented.   Anesthesia Topical anesthesia was used. Anesthetic medications included Akten 3.5%.   Procedure Preparation included Ofloxacin , 10% betadine to eyelids, 5% betadine to ocular surface. A 30 gauge needle was used.   Injection:  2 mg aflibercept Alfonse Flavors) SOLN   NDC: [id number], Lot: [id number]   Route: Intravitreal, Site: Left Eye, Waste: 0 mg  Post-op Post injection exam found visual acuity of at least counting fingers. The patient tolerated the procedure well. There were no complications. The patient received written and verbal post procedure care education. Post injection medications were not given.                 ASSESSMENT/PLAN:  Exudative age-related macular degeneration of left eye with active choroidal neovascularization (HCC) OS, increased SRF, post change to [facility] April 2021, will repeat injection today and if increase fluid continues or persists, will upon follow-up in 5 weeks change  agent back to Avastin      ICD-10-CM   1. Exudative age-related macular degeneration of left eye with active choroidal neovascularization (HCC)  H35.3221 OCT, Retina - OU - Both Eyes    Intravitreal Injection, Pharmacologic Agent - OS - Left Eye    aflibercept (EYLEA) SOLN 2 mg    1.  Repeat injection intravitreal Eylea OS today.  We will be watching closely clinically and on OCT next visit for subretinal fluid persistence and or increasing size, as this has worsened slightly since  our change from intravitreal Avastin to [facility]  2.  Next visit in 5 to 6 weeks depending on patient availability, dilate OU, planned injection Eylea OS and prepared to switch to intravitreal Avastin OS  3.  Ophthalmic Meds Ordered this visit:  Meds ordered this encounter  Medications  . aflibercept (EYLEA) SOLN 2 mg       Return in about 5 weeks (around 11/25/2019) for DILATE OU, EYLEA OCT. possible change to AVASTIN if condition worsens.  There are no Patient Instructions on file for this visit.   Explained the diagnoses, plan, and follow up with the patient and they expressed understanding.  Patient expressed understanding of the importance of proper follow up care.   Clent Demark Joline Encalada M.D. Diseases & Surgery of the Retina and Vitreous Retina & Diabetic [place] 10/21/19     Abbreviations: M myopia (nearsighted); A astigmatism; H hyperopia (farsighted); P presbyopia; Mrx spectacle prescription;  CTL contact lenses; OD right eye; OS left eye; OU both eyes  XT exotropia; ET esotropia; PEK punctate epithelial keratitis; PEE punctate epithelial erosions; DES dry eye syndrome; MGD meibomian gland dysfunction; ATs artificial tears; PFAT's preservative free artificial tears; [place] nuclear sclerotic cataract; PSC posterior subcapsular cataract; ERM epi-retinal membrane; PVD posterior vitreous detachment; RD retinal detachment; DM diabetes mellitus; DR diabetic retinopathy; NPDR non-proliferative diabetic retinopathy; PDR proliferative diabetic retinopathy; CSME clinically significant macular edema; DME diabetic macular edema; dbh dot blot hemorrhages; CWS cotton wool spot; POAG primary open angle glaucoma; C/D cup-to-disc ratio; HVF humphrey visual field; GVF goldmann visual field; OCT optical coherence tomography; IOP intraocular pressure; BRVO Branch retinal vein occlusion; CRVO central retinal vein occlusion; CRAO central retinal artery occlusion; BRAO branch retinal artery occlusion; RT retinal  tear; SB scleral buckle; PPV pars plana vitrectomy; VH Vitreous hemorrhage; PRP panretinal laser photocoagulation; IVK intravitreal kenalog; VMT vitreomacular traction; MH Macular hole;  NVD neovascularization of the disc; NVE neovascularization elsewhere; AREDS age related eye disease study; ARMD age related macular degeneration; POAG primary open angle glaucoma; EBMD epithelial/anterior basement membrane dystrophy; ACIOL anterior chamber intraocular lens; IOL intraocular lens; PCIOL posterior chamber intraocular lens; Phaco/IOL phacoemulsification with intraocular lens placement; [place] photorefractive keratectomy; LASIK laser assisted in situ keratomileusis; HTN hypertension; DM diabetes mellitus; COPD chronic obstructive pulmonary disease

## 2019-10-23 ENCOUNTER — Ambulatory Visit: Payer: Medicare Other | Admitting: Cardiology

## 2019-11-03 ENCOUNTER — Other Ambulatory Visit: Payer: Self-pay | Admitting: Cardiology

## 2019-11-03 DIAGNOSIS — E7800 Pure hypercholesterolemia, unspecified: Secondary | ICD-10-CM | POA: Diagnosis not present

## 2019-11-04 LAB — LIPID PANEL WITH LDL/HDL RATIO
Cholesterol, Total: 158 mg/dL (ref 100–199)
HDL: 37 mg/dL — ABNORMAL LOW (ref >39)
LDL Chol Calc (NIH): 83 mg/dL (ref 0–99)
LDL/HDL Ratio: 2.2 ratio (ref 0.0–3.2)
Triglycerides: 230 mg/dL — ABNORMAL HIGH (ref 0–149)
VLDL Cholesterol Cal: 38 mg/dL (ref 5–40)

## 2019-11-13 ENCOUNTER — Encounter: Payer: Self-pay | Admitting: Cardiology

## 2019-11-13 ENCOUNTER — Telehealth: Payer: Self-pay | Admitting: Cardiology

## 2019-11-13 ENCOUNTER — Other Ambulatory Visit: Payer: Self-pay

## 2019-11-13 ENCOUNTER — Ambulatory Visit: Payer: Medicare Other | Admitting: Cardiology

## 2019-11-13 VITALS — BP 169/81 | HR 90 | Resp 16 | Ht 61.0 in | Wt 230.4 lb

## 2019-11-13 DIAGNOSIS — I739 Peripheral vascular disease, unspecified: Secondary | ICD-10-CM

## 2019-11-13 DIAGNOSIS — E782 Mixed hyperlipidemia: Secondary | ICD-10-CM | POA: Diagnosis not present

## 2019-11-13 DIAGNOSIS — I10 Essential (primary) hypertension: Secondary | ICD-10-CM | POA: Diagnosis not present

## 2019-11-13 DIAGNOSIS — G4730 Sleep apnea, unspecified: Secondary | ICD-10-CM | POA: Diagnosis not present

## 2019-11-13 DIAGNOSIS — E669 Obesity, unspecified: Secondary | ICD-10-CM | POA: Diagnosis not present

## 2019-11-13 MED ORDER — LISINOPRIL-HYDROCHLOROTHIAZIDE 20-25 MG PO TABS: 1.0000 | ORAL_TABLET | Freq: Every day | ORAL | 3 refills | Status: DC

## 2019-11-13 MED ORDER — EZETIMIBE 10 MG PO TABS: 10.0000 mg | ORAL_TABLET | Freq: Every day | ORAL | 3 refills | Status: DC

## 2019-11-13 NOTE — Progress Notes (Signed)
Primary Physician/Referring:  Wendie Agreste, MD   Patient ID: [name], female    DOB: 1945-09-06, 74 y.o.   MRN: [id number]  Chief Complaint  Patient presents with   PAD   Follow-up    3 month   HPI:    [name]  is a 74 y.o. female  with PSVT, hypertension, type 2 diabetes, hyperlipidemia, morbid obesity, OSA on CPAP, right leg claudication, presents here for follow-up of PAD and also hyperlipidemia. She has not had any chest pain, no recent palpitations or leg edema.  She remained stable, symptoms of claudication are remained stable, no rest pain.  No chest pain, no palpitations or dyspnea.  Past Medical History:  Diagnosis Date   Allergic rhinitis 03/23/2013   Allergy    Anemia    due to vaginal bleeding   Arthritis    hands   Asthmatic bronchitis 09/03/2017   Breast cancer ([place]) 08/2012   left   Dental crowns present    Eczema    Fibroids    Headache(784.0)    tension   History of endometriosis    Hyperlipidemia    Hypertension    under control with med., has been on med. x 3 yr.   OSA (obstructive sleep apnea)    Postmenopausal bleeding    Recurrent upper respiratory infection (URI)    S/P radiation therapy 10/20/2012-12/05/2012   left breast 50.4 gray, lumpectomy cavity boosted to 62.4 gray   Seasonal allergies    Sleep apnea    uses CPAP nightly   Urinary urgency    White coat hypertension    Past Surgical History:  Procedure Laterality Date   BACK SURGERY  1997   lumbar   BREAST LUMPECTOMY WITH NEEDLE LOCALIZATION AND AXILLARY SENTINEL LYMPH NODE BX Left 09/03/2012   Procedure: BREAST LUMPECTOMY WITH NEEDLE LOCALIZATION AND AXILLARY SENTINEL LYMPH NODE BX;  Surgeon: Edward Jolly, MD;  Location: [facility];  Service: General;  Laterality: Left;   CHOLECYSTECTOMY  1995   COLONOSCOPY W/ POLYPECTOMY  03/20/2007   DILATION AND CURETTAGE OF UTERUS  2005   DILATION AND CURETTAGE OF UTERUS   06/22/2011   Procedure: DILATATION AND CURETTAGE;  Surgeon: Alvino Chapel, MD;  Location: [facility];  Service: Gynecology;  Laterality: N/A;  OK PER KEELA FOR 7:15 START   DORSAL COMPARTMENT RELEASE Right 05/19/2013   Procedure: RELEASE 1ST  DORSAL COMPARTMENT RIGHT (DEQUERVAIN);  Surgeon: Cammie Sickle., MD;  Location: [facility];  Service: Orthopedics;  Laterality: Right;   EXPLORATORY LAPAROTOMY  06-30-2007   ATTEMPTED HYSTERECTOMY ABORTED DUE TO EXTENSIVE ENDOMETRIOSIS W/ DENSE PELVIC ADHESIONS INVOLVING UTERUS AND LOWER RECTOSIGMOID   HYSTEROSCOPY WITH D & C  05/06/2009   Family History  Problem Relation Age of Onset   Vision loss Mother    Hypertension Mother    Anesthesia problems Mother        post-op N/V   Heart disease Mother        carotid stenosis s/p CAE   Osteoporosis Mother    Eczema Mother    Depression Father    Heart disease Sister 46       arrhythmia   Hypertension Sister    Hyperlipidemia Sister    Diabetes Brother    Hypertension Brother    Cancer Brother        skin   Hyperlipidemia Brother    Stroke Brother    COPD Daughter  Atopy Daughter    Cancer Cousin    Kidney failure Maternal Grandmother    Heart disease Maternal Grandfather    Hypertension Other    Colon cancer Neg Hx    Allergic rhinitis Neg Hx    Asthma Neg Hx    Urticaria Neg Hx     Social History   Tobacco Use   Smoking status: Never Smoker   Smokeless tobacco: Never Used  Substance Use Topics   Alcohol use: No    Alcohol/week: 0.0 standard drinks   Marital Status: Married   ROS  Review of Systems  Cardiovascular: Positive for claudication. Negative for dyspnea on exertion, leg swelling and syncope.  Respiratory: Negative for shortness of breath.   Musculoskeletal: Negative for joint swelling.   Objective  Blood pressure (!) 169/81, pulse 90, resp. rate 16, height 5\' 1"  (1.549 m), weight 230 lb 6.4 oz  (104.5 kg), SpO2 98 %.  Vitals with BMI 11/13/2019 11/13/2019 10/19/2019  Height - 5\' 1"  5\' 1"   Weight - 230 lbs 6 oz 240 lbs  BMI - 61.44 31.54  Systolic 008 676 195  Diastolic 81 93 70  Pulse 90 94 -     Physical Exam Constitutional:      General: She is not in acute distress.    Appearance: She is well-developed.  Cardiovascular:     Rate and Rhythm: Normal rate and regular rhythm.     Pulses: Intact distal pulses.          Dorsalis pedis pulses are 0 on the right side and 0 on the left side.       Posterior tibial pulses are 0 on the right side and 0 on the left side.     Heart sounds: No murmur heard.  No gallop.      Comments: Pulses difficult to feel due to bodily habitus  Pulmonary:     Effort: Pulmonary effort is normal. No accessory muscle usage or respiratory distress.     Breath sounds: Normal breath sounds.  Abdominal:     Palpations: Abdomen is soft.    Laboratory examination:   Recent Labs    03/25/19 1042 07/01/19 0949  NA 139 137  K 4.7 4.7  CL 100 99  CO2 23 25  GLUCOSE 167* 180*  BUN 15 12  CREATININE 0.66 0.81  CALCIUM 10.4* 9.3  GFRNONAA 87 72  GFRAA 101 83   CrCl cannot be calculated (Patient's most recent lab result is older than the maximum 21 days allowed.).  CMP Latest Ref Rng & Units 07/01/2019 03/25/2019 09/09/2018  Glucose 65 - 99 mg/dL 180(H) 167(H) 120(H)  BUN 8 - 27 mg/dL 12 15 16   Creatinine 0.57 - 1.00 mg/dL 0.81 0.66 0.71  Sodium 134 - 144 mmol/L 137 139 140  Potassium 3.5 - 5.2 mmol/L 4.7 4.7 4.0  Chloride 96 - 106 mmol/L 99 100 100  CO2 20 - 29 mmol/L 25 23 24   Calcium 8.7 - 10.3 mg/dL 9.3 10.4(H) 10.0  Total Protein 6.0 - 8.5 g/dL 6.7 6.9 7.0  Total Bilirubin 0.0 - 1.2 mg/dL 0.5 0.5 0.5  Alkaline Phos 39 - 117 IU/L 100 100 69  AST 0 - 40 IU/L 26 20 29   ALT 0 - 32 IU/L 15 11 17    CBC Latest Ref Rng & Units 07/29/2017 04/15/2017 12/11/2016  WBC 3.4 - 10.8 x10E3/uL 5.5 5.6 6.4  Hemoglobin 11.1 - 15.9 g/dL 13.0 12.4 12.8    Hematocrit 34.0 - 46.6 %  38.1 38.7 38.6  Platelets 150 - 379 x10E3/uL 228 234 263   Lipid Panel Recent Labs    03/25/19 1042 07/01/19 0948 11/03/19 1059  CHOL 209* 130 158  TRIG 140 152* 230*  LDLCALC 135* 66 83  HDL 49 38* 37*  CHOLHDL 4.3 3.4  --     HEMOGLOBIN A1C Lab Results  Component Value Date   HGBA1C 7.9 (H) 07/22/2019   MPG 120 12/22/2015   TSH No results for input(s): TSH in the last 8760 hours.  External labs:   TSH 1.570 12/11/2016  Medications and allergies   Allergies  Allergen Reactions   Peanut (Diagnostic) Cough, Hypertension and Shortness Of Breath   Adhesive [Tape] Rash   Crestor [Rosuvastatin] Other (See Comments)    Severe arthralgia and myalgia   Latex Itching and Other (See Comments)    Causes blisters   Lovastatin Other (See Comments)    Arthralgias, hyperglycemia with lovastatin.     Current Outpatient Medications  Medication Instructions   aspirin 81 mg, Daily   Brimonidine Tartrate-Timolol (COMBIGAN OP) Ophthalmic   cholecalciferol (VITAMIN D) 1,000 Units, Oral, Daily   clotrimazole (LOTRIMIN) 1 % cream 1 application, Topical, 2 times daily, To skin between toes.   Cyanocobalamin (VITAMIN B-12 CR PO) 500 mcg, Daily   ezetimibe (ZETIA) 10 mg, Oral, Daily after supper   fluticasone (FLONASE) 50 MCG/ACT nasal spray 1 spray, Each Nare, Daily   fluticasone (FLOVENT HFA) 44 MCG/ACT inhaler 2 puffs, Inhalation, 2 times daily, With spacer.   lisinopril-hydrochlorothiazide (ZESTORETIC) 20-25 MG tablet 1 tablet, Oral, Daily   loratadine (CLARITIN) 10 mg, Oral, Daily   metFORMIN (GLUCOPHAGE) 500 mg, Oral, 2 times daily   metoprolol succinate (TOPROL-XL) 25 mg, Oral, Daily   pravastatin (PRAVACHOL) 20 mg, Oral, Every other day   tamoxifen (NOLVADEX) 10 mg, Oral, Daily   Turmeric Curcumin 500 MG CAPS Oral   vitamin C (ASCORBIC ACID) 500 mg, Oral, 2 times daily   Zinc 50 MG TABS No dose, route, or frequency recorded.    Radiology:   No results found.  Cardiac Studies:   Event monitor 09/27/15-10-29-2015: Sinus rhythm, heart rate ranging between 59-138/m. PACs. One run of PSVT seen on 09/29/2015 at 2.26 AM at average heart rate-150/m.  Lexiscan myoview stress test 09/09/2017: 1. Lexiscan stress test was performed. Exercise capacity was not assessed. Stress symptoms included dyspnea, dizziness. Peak blood pressure was 158/56 mmHg. The stress electrocardiogram showed sinus tachycardia, normal stress conduction, no stress arrhythmias and normal stress repolarization. Stress EKG is non diagnostic for ischemia as it is a pharmacologic stress. 2. The overall quality of the study is excellent. There is no evidence of abnormal lung activity. Stress and rest SPECT images demonstrate homogeneous tracer distribution throughout the myocardium. Gated SPECT imaging reveals normal myocardial thickening and wall motion. The left ventricular ejection fraction was normal (76%). 3. Low risk study.   Echocardiogram 09/17/2017: Left ventricle cavity is normal in size. Mild concentric hypertrophy of the left ventricle. Normal global wall motion. Doppler evidence of grade I (impaired) diastolic dysfunction, normal LAP. Calculated EF 56%. Right ventricle cavity is mildly dilated. Normal right ventricular function. Mild tricuspid regurgitation. Mild pulmonary hypertension. Estimated pulmonary artery systolic pressure 32 mmHg IVC is dilated with respiratory variation. This may suggest elevated right heart pressure. Compared to the store on 11/23/25, mild RV dilatation and mild pulmonary hypertension new.  Lower Extremity Arterial Duplex 04/23/2019:  No hemodynamically significant stenoses are identified in the bilateral lower extremity arterial system.  Severely abnormal waveform at the right PT suggests severe diffuse disease.  This exam reveals normal perfusion of the right and left lower extremity (ABI 1.46). ABI may be falsely elevated in  patients with DM.  EKG    04/02/2019: Normal sinus rhythm at 84 bpm, normal axis, nonspecific T wave abnormality.  Assessment     ICD-10-CM   1. PAD (peripheral artery disease) (HCC)  I73.9 EKG 12-Lead    ezetimibe (ZETIA) 10 MG tablet  2. Primary hypertension  I10 lisinopril-hydrochlorothiazide (ZESTORETIC) 20-25 MG tablet  3. Mixed hyperlipidemia  E78.2      Meds ordered this encounter  Medications   ezetimibe (ZETIA) 10 MG tablet    Sig: Take 1 tablet (10 mg total) by mouth daily after supper.    Dispense:  90 tablet    Refill:  3   lisinopril-hydrochlorothiazide (ZESTORETIC) 20-25 MG tablet    Sig: Take 1 tablet by mouth daily.    Dispense:  90 tablet    Refill:  3    Medications Discontinued During This Encounter  Medication Reason   ezetimibe (ZETIA) 10 MG tablet Patient Preference   lisinopril-hydrochlorothiazide (ZESTORETIC) 10-12.5 MG tablet Dose change    Recommendations:   ESTEFANNY MOLER  is a 74 y.o.  female  with PSVT, hypertension, type 2 diabetes, hyperlipidemia, morbid obesity, OSA on CPAP, right leg claudication, presents here for follow-up of PAD and also hyperlipidemia. Symptoms of peripheral arterial disease with right leg claudication are stable, advised her to continue to increase her physical activity which is certainly help in improving collateral circulation and small vessels.   With regard to hypertension, blood pressure is elevated. Will increase Lisinopril HCT to 20/25 mg daily.  With regard to hyperlipidemia, with Crestor her lipids have improved. She does not want to increase the dose as she has severe myalgias with statins. COntinue present meds.  I discussed mixed hyperlipidemia and obesity. Diet discussed. I will see her back in 1 year. I have requested her PCP Dr. Janeann Forehand to see her in 2-3 months for f/u of hypertension   Adrian Prows, MD, [facility] 11/13/2019, 11:38 AM Office: [phone]

## 2019-11-16 NOTE — Telephone Encounter (Signed)
Called pt and sch 2 month f/u for hypertension for 01/13/20.

## 2019-11-30 ENCOUNTER — Telehealth: Payer: Self-pay | Admitting: Neurology

## 2019-11-30 NOTE — Telephone Encounter (Signed)
I returned the pt's call. She sts she received a certified letter over the weekend stating her phillips machine has been recalled.  Pt and I discussed the risk factors for machine including if the machine had been exposed to heat, treated with socleaner or ozone cleaners or if she has noticed any black particles floating in the water chamber. Pt answered no to these questions.  She is eligible to start a new machine and would like to keep her supplier as [place] home pt. I have sent order and office notes. Pt will continue using older machine until the new machine is approved.

## 2019-11-30 NOTE — Telephone Encounter (Signed)
Pt called CPAP machine has been recalled. Need to speak with nurse about the next course of action. Can my CPAP machine be replaced.

## 2019-12-02 ENCOUNTER — Ambulatory Visit (INDEPENDENT_AMBULATORY_CARE_PROVIDER_SITE_OTHER): Payer: Medicare Other | Admitting: Ophthalmology

## 2019-12-02 ENCOUNTER — Other Ambulatory Visit: Payer: Self-pay

## 2019-12-02 ENCOUNTER — Encounter (INDEPENDENT_AMBULATORY_CARE_PROVIDER_SITE_OTHER): Payer: Self-pay | Admitting: Ophthalmology

## 2019-12-02 DIAGNOSIS — H2511 Age-related nuclear cataract, right eye: Secondary | ICD-10-CM | POA: Insufficient documentation

## 2019-12-02 DIAGNOSIS — H2513 Age-related nuclear cataract, bilateral: Secondary | ICD-10-CM | POA: Diagnosis not present

## 2019-12-02 DIAGNOSIS — H353132 Nonexudative age-related macular degeneration, bilateral, intermediate dry stage: Secondary | ICD-10-CM

## 2019-12-02 DIAGNOSIS — H353221 Exudative age-related macular degeneration, left eye, with active choroidal neovascularization: Secondary | ICD-10-CM

## 2019-12-02 HISTORY — DX: Age-related nuclear cataract, right eye: H25.11

## 2019-12-02 MED ORDER — AFLIBERCEPT 2MG/0.05ML IZ SOLN FOR KALEIDOSCOPE
2.0000 mg | INTRAVITREAL | Status: AC | PRN
  Administered 2019-12-02: 2 mg via INTRAVITREAL

## 2019-12-02 NOTE — Patient Instructions (Signed)
Patient asked to notify the office promptly for should new difficulties in vision, distortions or decline in vision

## 2019-12-02 NOTE — Assessment & Plan Note (Signed)
The nature of cataract was discussed with the patient as well as the elective nature of surgery. The patient was reassured that surgery at a later date does not put the patient at risk for a worse outcome. It was emphasized that the need for surgery is dictated by the patient's quality of life as influenced by the cataract. Patient was instructed to maintain close follow up with their general eye care doctor.  I recommend follow-up with Dr. Katy Fitch to consider cataract traction with intraocular lens placement for these dense, dark yellow-green cataracts

## 2019-12-02 NOTE — Assessment & Plan Note (Signed)
OS, slightly improved with less subretinal fluid Now at 6 to 6 weeks post intravitreal Eylea, will repeat today

## 2019-12-02 NOTE — Assessment & Plan Note (Signed)

## 2019-12-02 NOTE — Progress Notes (Signed)
12/02/2019     CHIEF COMPLAINT Patient presents for Retina Follow Up   HISTORY OF PRESENT ILLNESS: [name] is a 74 y.o. female who presents to the clinic today for:   HPI    Retina Follow Up    Patient presents with  Wet AMD.  In left eye.  This started 6 weeks ago.  Severity is mild.  Duration of 6 weeks.  Since onset it is gradually worsening.          Comments    6 Week AMD F/U OU, poss Eylea OS  Pt sts, "I don't know, it just seems to keep getting a little bit worse" OS. Pt denies any new symptoms OU.       Last edited by Rockie Neighbours, [place] on 12/02/2019  8:05 AM. (History)      Referring physician: Wendie Agreste, MD [address] [place],  [place] [address]  HISTORICAL INFORMATION:   Selected notes from the MEDICAL RECORD NUMBER    Lab Results  Component Value Date   HGBA1C 7.9 (H) 07/22/2019     CURRENT MEDICATIONS: Current Outpatient Medications (Ophthalmic Drugs)  Medication Sig  . Brimonidine Tartrate-Timolol (COMBIGAN OP) Apply to eye.   No current facility-administered medications for this visit. (Ophthalmic Drugs)   Current Outpatient Medications (Other)  Medication Sig  . aspirin 81 MG tablet Take 81 mg by mouth daily.  . cholecalciferol (VITAMIN D) 1000 units tablet Take 1,000 Units by mouth daily.  . clotrimazole (LOTRIMIN) 1 % cream Apply 1 application topically 2 (two) times daily. To skin between toes.  . Cyanocobalamin (VITAMIN B-12 CR PO) Take 500 mcg by mouth daily.   Marland Kitchen ezetimibe (ZETIA) 10 MG tablet Take 1 tablet (10 mg total) by mouth daily after supper.  . fluticasone (FLONASE) 50 MCG/ACT nasal spray Place 1 spray into both nostrils daily.  . fluticasone (FLOVENT HFA) 44 MCG/ACT inhaler Inhale 2 puffs into the lungs 2 (two) times daily. With spacer.  Marland Kitchen lisinopril-hydrochlorothiazide (ZESTORETIC) 20-25 MG tablet Take 1 tablet by mouth daily.  Marland Kitchen loratadine (CLARITIN) 10 MG tablet Take 1 tablet (10 mg total) by mouth daily.    . metFORMIN (GLUCOPHAGE) 500 MG tablet Take 1 tablet (500 mg total) by mouth in the morning and at bedtime.  . metoprolol succinate (TOPROL-XL) 25 MG 24 hr tablet Take 1 tablet (25 mg total) by mouth daily.  . pravastatin (PRAVACHOL) 20 MG tablet Take 20 mg by mouth every other day.  . tamoxifen (NOLVADEX) 10 MG tablet Take 1 tablet (10 mg total) by mouth daily. (Patient taking differently: Take 10 mg by mouth every other day. )  . Turmeric Curcumin 500 MG CAPS Take by mouth.  . vitamin C (ASCORBIC ACID) 500 MG tablet Take 500 mg by mouth 2 (two) times daily.   . Zinc 50 MG TABS    Current Facility-Administered Medications (Other)  Medication Route  . EPINEPHrine (Anaphylaxis) SOLN 0.3 mg Intramuscular      REVIEW OF SYSTEMS:    ALLERGIES Allergies  Allergen Reactions  . Peanut (Diagnostic) Cough, Hypertension and Shortness Of Breath  . Adhesive [Tape] Rash  . Crestor [Rosuvastatin] Other (See Comments)    Severe arthralgia and myalgia  . Latex Itching and Other (See Comments)    Causes blisters  . Lovastatin Other (See Comments)    Arthralgias, hyperglycemia with lovastatin.    PAST MEDICAL HISTORY Past Medical History:  Diagnosis Date  . Allergic rhinitis 03/23/2013  . Allergy   .  Anemia    due to vaginal bleeding  . Arthritis    hands  . Asthmatic bronchitis 09/03/2017  . Breast cancer ([place]) 08/2012   left  . Dental crowns present   . Eczema   . Fibroids   . Headache(784.0)    tension  . History of endometriosis   . Hyperlipidemia   . Hypertension    under control with med., has been on med. x 3 yr.  . OSA (obstructive sleep apnea)   . Postmenopausal bleeding   . Recurrent upper respiratory infection (URI)   . S/P radiation therapy 10/20/2012-12/05/2012   left breast 50.4 gray, lumpectomy cavity boosted to 62.4 gray  . Seasonal allergies   . Sleep apnea    uses CPAP nightly  . Urinary urgency   . White coat hypertension    Past Surgical History:   Procedure Laterality Date  . BACK SURGERY  1997   lumbar  . BREAST LUMPECTOMY WITH NEEDLE LOCALIZATION AND AXILLARY SENTINEL LYMPH NODE BX Left 09/03/2012   Procedure: BREAST LUMPECTOMY WITH NEEDLE LOCALIZATION AND AXILLARY SENTINEL LYMPH NODE BX;  Surgeon: Edward Jolly, MD;  Location: [facility];  Service: General;  Laterality: Left;  . CHOLECYSTECTOMY  1995  . COLONOSCOPY W/ POLYPECTOMY  03/20/2007  . DILATION AND CURETTAGE OF UTERUS  2005  . DILATION AND CURETTAGE OF UTERUS  06/22/2011   Procedure: DILATATION AND CURETTAGE;  Surgeon: Alvino Chapel, MD;  Location: [facility];  Service: Gynecology;  Laterality: N/A;  OK PER KEELA FOR 7:15 START  . DORSAL COMPARTMENT RELEASE Right 05/19/2013   Procedure: RELEASE 1ST  DORSAL COMPARTMENT RIGHT (DEQUERVAIN);  Surgeon: Cammie Sickle., MD;  Location: [facility];  Service: Orthopedics;  Laterality: Right;  . EXPLORATORY LAPAROTOMY  06-30-2007   ATTEMPTED HYSTERECTOMY ABORTED DUE TO EXTENSIVE ENDOMETRIOSIS W/ DENSE PELVIC ADHESIONS INVOLVING UTERUS AND LOWER RECTOSIGMOID  . HYSTEROSCOPY WITH D & C  05/06/2009    FAMILY HISTORY Family History  Problem Relation Age of Onset  . Vision loss Mother   . Hypertension Mother   . Anesthesia problems Mother        post-op N/V  . Heart disease Mother        carotid stenosis s/p CAE  . Osteoporosis Mother   . Eczema Mother   . Depression Father   . Heart disease Sister 18       arrhythmia  . Hypertension Sister   . Hyperlipidemia Sister   . Diabetes Brother   . Hypertension Brother   . Cancer Brother        skin  . Hyperlipidemia Brother   . Stroke Brother   . COPD Daughter   . Atopy Daughter   . Cancer Cousin   . Kidney failure Maternal Grandmother   . Heart disease Maternal Grandfather   . Hypertension Other   . Colon cancer Neg Hx   . Allergic rhinitis Neg Hx   . Asthma Neg Hx   . Urticaria Neg Hx     SOCIAL  HISTORY Social History   Tobacco Use  . Smoking status: Never Smoker  . Smokeless tobacco: Never Used  Vaping Use  . Vaping Use: Never used  Substance Use Topics  . Alcohol use: No    Alcohol/week: 0.0 standard drinks  . Drug use: No         OPHTHALMIC EXAM:  Base Eye Exam    Visual Acuity (ETDRS)  Right Left   Dist cc 20/40 -1 20/40 -2   Dist ph cc 20/25 -1 NI   Correction: Glasses       Tonometry (Tonopen, 8:06 AM)      Right Left   Pressure 13 12       Pupils      Pupils Dark Light Shape React APD   Right PERRL 4 3 Round Slow None   Left PERRL 4 3 Round Slow None       Visual Fields (Counting fingers)      Left Right    Full Full       Extraocular Movement      Right Left    Full Full       Neuro/Psych    Oriented x3: Yes   Mood/Affect: Normal       Dilation    Both eyes: 1.0% Mydriacyl, 2.5% Phenylephrine @ 8:09 AM        Slit Lamp and Fundus Exam    External Exam      Right Left   External Normal Normal       Slit Lamp Exam      Right Left   Lids/Lashes Normal Normal   Conjunctiva/Sclera White and quiet White and quiet   Cornea Clear Clear   Anterior Chamber Deep and quiet Deep and quiet   Iris Round and reactive Round and reactive   Lens  2+ Nuclear sclerosis   Anterior Vitreous Normal Normal       Fundus Exam      Right Left   Posterior Vitreous Posterior vitreous detachment Posterior vitreous detachment   Disc Peripapillary atrophy Peripapillary atrophy   C/D Ratio 0.35 0.35   Macula Pigmented atrophy, Retinal pigment epithelial mottling, Hard drusen, no exudates Retinal pigment epithelial mottling, no exudates, no hemorrhage, no macular thickening, Early age related macular degeneration, Pigmented atrophy   Vessels Normal Normal   Periphery Normal Normal          IMAGING AND PROCEDURES  Imaging and Procedures for 12/02/19  OCT, Retina - OU - Both Eyes       Right Eye Quality was good. Scan locations included  subfoveal. Central Foveal Thickness: 233. Progression has been stable. Findings include retinal drusen , central retinal atrophy.   Left Eye Quality was good. Scan locations included subfoveal. Central Foveal Thickness: 243. Progression has improved. Findings include subretinal fluid, outer retinal atrophy.   Notes OS, slightly improved with less subretinal fluid Now at 6 to 6 weeks post intravitreal Eylea, will repeat today       Intravitreal Injection, Pharmacologic Agent - OS - Left Eye       Time Out 12/02/2019. 8:47 AM. Confirmed correct patient, procedure, site, and patient consented.   Anesthesia Topical anesthesia was used. Anesthetic medications included Akten 3.5%.   Procedure Preparation included Tobramycin 0.3%, 10% betadine to eyelids, 5% betadine to ocular surface. A 30 gauge needle was used.   Injection:  2 mg aflibercept Alfonse Flavors) SOLN   NDC: [id number], Lot: [id number]   Route: Intravitreal, Site: Left Eye, Waste: 0 mg  Post-op Post injection exam found visual acuity of at least counting fingers. The patient tolerated the procedure well. There were no complications. The patient received written and verbal post procedure care education.                 ASSESSMENT/PLAN:  Exudative age-related macular degeneration of left eye with active choroidal neovascularization (HCC) OS, slightly improved with less subretinal  fluid Now at 6 to 6 weeks post intravitreal Eylea, will repeat today  Nuclear sclerotic cataract of both eyes The nature of cataract was discussed with the patient as well as the elective nature of surgery. The patient was reassured that surgery at a later date does not put the patient at risk for a worse outcome. It was emphasized that the need for surgery is dictated by the patient's quality of life as influenced by the cataract. Patient was instructed to maintain close follow up with their general eye care doctor.  I recommend follow-up with  Dr. Katy Fitch to consider cataract traction with intraocular lens placement for these dense, dark yellow-green cataracts      ICD-10-CM   1. Exudative age-related macular degeneration of left eye with active choroidal neovascularization (HCC)  H35.3221 OCT, Retina - OU - Both Eyes    Intravitreal Injection, Pharmacologic Agent - OS - Left Eye    aflibercept (EYLEA) SOLN 2 mg  2. Nuclear sclerotic cataract of both eyes  H25.13     1.  Repeat injection intravitreal Eylea OS today as the amount of subretinal fluid continues to slowly decline, 6-week interval currently  2.  Dilate left eye in 6 weeks and possible injection Eylea  3.  Encourage patient to follow-up with Dr. Clent Jacks for consideration of cataract extraction in each eye in a sequential fashion to improve and maximize her visual acuity  Ophthalmic Meds Ordered this visit:  Meds ordered this encounter  Medications  . aflibercept (EYLEA) SOLN 2 mg       Return in about 6 weeks (around 01/13/2020) for dilate, OS, EYLEA OCT.  Patient Instructions  Patient asked to notify the office promptly for should new difficulties in vision, distortions or decline in vision    Explained the diagnoses, plan, and follow up with the patient and they expressed understanding.  Patient expressed understanding of the importance of proper follow up care.   Clent Demark Brandn Mcgath M.D. Diseases & Surgery of the Retina and Vitreous [facility]ina [facility] Di[facility] [place] 12/02/19     Abbreviations: M myopia (nearsighted); A astigmatism; H hyperopia (farsighted); P presbyopia; Mrx spectacle prescription;  CTL contact lenses; OD right eye; OS left eye; OU both eyes  XT exotropia; ET esotropia; PEK punctate epithelial keratitis; PEE punctate epithelial erosions; DES dry eye syndrome; MGD meibomian gland dysfunction; ATs artificial tears; PFAT's preservative free artificial tears; [place] nuclear sclerotic cataract; PSC posterior subcapsular cataract; ERM epi-retinal  membrane; PVD posterior vitreous detachment; RD retinal detachment; DM diabetes mellitus; DR diabetic retinopathy; NPDR non-proliferative diabetic retinopathy; PDR proliferative diabetic retinopathy; CSME clinically significant macular edema; DME diabetic macular edema; dbh dot blot hemorrhages; CWS cotton wool spot; POAG primary open angle glaucoma; C/D cup-to-disc ratio; HVF humphrey visual field; GVF goldmann visual field; OCT optical coherence tomography; IOP intraocular pressure; BRVO Branch retinal vein occlusion; CRVO central retinal vein occlusion; CRAO central retinal artery occlusion; BRAO branch retinal artery occlusion; RT retinal tear; SB scleral buckle; PPV pars plana vitrectomy; VH Vitreous hemorrhage; PRP panretinal laser photocoagulation; IVK intravitreal kenalog; VMT vitreomacular traction; MH Macular hole;  NVD neovascularization of the disc; NVE neovascularization elsewhere; AREDS age related eye disease study; ARMD age related macular degeneration; POAG primary open angle glaucoma; EBMD epithelial/anterior basement membrane dystrophy; ACIOL anterior chamber intraocular lens; IOL intraocular lens; PCIOL posterior chamber intraocular lens; Phaco/IOL phacoemulsification with intraocular lens placement; [place] photorefractive keratectomy; LASIK laser assisted in situ keratomileusis; HTN hypertension; DM diabetes mellitus; COPD chronic obstructive pulmonary disease

## 2019-12-10 ENCOUNTER — Encounter: Payer: Self-pay | Admitting: Neurology

## 2020-01-01 DIAGNOSIS — Z1231 Encounter for screening mammogram for malignant neoplasm of breast: Secondary | ICD-10-CM | POA: Diagnosis not present

## 2020-01-01 LAB — HM MAMMOGRAPHY

## 2020-01-12 DIAGNOSIS — L814 Other melanin hyperpigmentation: Secondary | ICD-10-CM | POA: Diagnosis not present

## 2020-01-12 DIAGNOSIS — Z85828 Personal history of other malignant neoplasm of skin: Secondary | ICD-10-CM | POA: Diagnosis not present

## 2020-01-12 DIAGNOSIS — D1801 Hemangioma of skin and subcutaneous tissue: Secondary | ICD-10-CM | POA: Diagnosis not present

## 2020-01-12 DIAGNOSIS — L821 Other seborrheic keratosis: Secondary | ICD-10-CM | POA: Diagnosis not present

## 2020-01-13 ENCOUNTER — Encounter: Payer: Self-pay | Admitting: Family Medicine

## 2020-01-13 ENCOUNTER — Ambulatory Visit (INDEPENDENT_AMBULATORY_CARE_PROVIDER_SITE_OTHER): Payer: Medicare Other | Admitting: Family Medicine

## 2020-01-13 ENCOUNTER — Ambulatory Visit (INDEPENDENT_AMBULATORY_CARE_PROVIDER_SITE_OTHER): Payer: Medicare Other

## 2020-01-13 ENCOUNTER — Other Ambulatory Visit: Payer: Self-pay

## 2020-01-13 VITALS — BP 138/79 | HR 90 | Temp 98.2°F | Ht 61.0 in | Wt 225.0 lb

## 2020-01-13 DIAGNOSIS — M25511 Pain in right shoulder: Secondary | ICD-10-CM | POA: Diagnosis not present

## 2020-01-13 DIAGNOSIS — M7580 Other shoulder lesions, unspecified shoulder: Secondary | ICD-10-CM | POA: Diagnosis not present

## 2020-01-13 DIAGNOSIS — E1165 Type 2 diabetes mellitus with hyperglycemia: Secondary | ICD-10-CM

## 2020-01-13 DIAGNOSIS — M25512 Pain in left shoulder: Secondary | ICD-10-CM

## 2020-01-13 DIAGNOSIS — R519 Headache, unspecified: Secondary | ICD-10-CM | POA: Diagnosis not present

## 2020-01-13 DIAGNOSIS — Z23 Encounter for immunization: Secondary | ICD-10-CM

## 2020-01-13 DIAGNOSIS — I10 Essential (primary) hypertension: Secondary | ICD-10-CM | POA: Diagnosis not present

## 2020-01-13 DIAGNOSIS — M19012 Primary osteoarthritis, left shoulder: Secondary | ICD-10-CM | POA: Diagnosis not present

## 2020-01-13 MED ORDER — GLUCOSE BLOOD VI STRP: ORAL_STRIP | 4 refills | Status: AC

## 2020-01-13 MED ORDER — MELOXICAM 7.5 MG PO TABS: 7.5000 mg | ORAL_TABLET | Freq: Every day | ORAL | 0 refills | Status: DC

## 2020-01-13 NOTE — Progress Notes (Signed)
Subjective:  Patient ID: Diane [name], female    DOB: December 17, 1945  Age: 74 y.o. MRN: [id number]  CC:  Chief Complaint  Patient presents with  . Hypertension    Pt reports no issues with BP at home since last OV. pt states she did have a high BP at her cardiologist appt, but other than that no issues. Pt reports some mild headaches at times, but no other physical symptoms of hypertension.   . Arm Pain    Pt reports pain in both arms. Pt reports she fell on the beach on 12/05/2019. Pt states she fell on the soft sand. states she felt finenever got checked out. Pt states her arm pain was bothering her some before the fall, but is unsure if it may have made it worse.    HPI Diane [name] presents for   Hypertension: With history of PAD, cardiologist Dr. Einar [name], appointment August 27.  Elevated reading at that time, occasional headache but no other new symptoms.  No missed doses, no new side effects of medications.  Also with history of OSA on CPAP.  New machine recently started. Home readings: none recently, but ok prior - 120/60-70.  No HA in past few days. Up to daily prior, BP ok at time of [email or web address].[email or web address] HA.  Has had some ongoing eye issues - macular degeneration, cataracts,  followed by ophthalmology - injections received. Dr. Zadie [name], Diane [name]. Using cpap nightly.  Lisinopril hct 20/25mg  qd - new dose after cardiology visit.  BP Readings from Last 3 Encounters:  01/13/20 138/79  11/13/19 (!) 169/81  10/19/19 120/70   Lab Results  Component Value Date   CREATININE 0.81 07/01/2019   Diabetes: Complicated by hyperglycemia.  Associated with obesity Metformin 500 mg twice daily started after elevated readings in May.  she is on statin, as well as Zetia and ACE inhibitor. No new side effects of meds - tolerating well now.  Microalbumin: Normal ratio January 6. Optho, foot exam, pneumovax: Up-to-date No home readings - needs tests strips.  Lab Results  Component Value Date   HGBA1C 7.9  (H) 07/22/2019   HGBA1C 7.1 (H) 03/25/2019   HGBA1C 6.9 (H) 09/09/2018   Lab Results  Component Value Date   MICROALBUR 0.2 12/06/2015   LDLCALC 83 11/03/2019   CREATININE 0.81 07/01/2019   Hyperlipidemia: Takes pravastatin and Zetia. No new side effects.  Lab Results  Component Value Date   CHOL 158 11/03/2019   HDL 37 (L) 11/03/2019   LDLCALC 83 11/03/2019   TRIG 230 (H) 11/03/2019   CHOLHDL 3.4 07/01/2019   Lab Results  Component Value Date   ALT 15 07/01/2019   AST 26 07/01/2019   ALKPHOS 100 07/01/2019   BILITOT 0.5 07/01/2019    Bilateral arm pain: Noted prior to fall at the beach September 18 fell onto soft sand.  Had PT after broken shoulder,  then pains in R upper arm restarted about a year ago.  Some soreness in shoulder increased in R shoulder few days after fall.  L shoulder/upper arm sore with lifting. Past few weeks. Injection in bursa over 10 years ago.  Min increase in soreness after fall in sand.  No recent ortho eval - prior Dr. Mardelle [name].  Tx: none.    History Patient Active Problem List   Diagnosis Date Noted  . Nuclear sclerotic cataract of both eyes 12/02/2019  . Class 3 severe obesity due to excess calories with serious comorbidity and body mass index (BMI) of  40.0 to 44.9 in adult Diane [name]) 07/09/2019  . OSA on CPAP 07/09/2019  . Glaucoma of right eye 07/09/2019  . Exudative age-related macular degeneration of left eye with active choroidal neovascularization ([name]) 07/06/2019  . Serous detachment of retinal pigment epithelium of left eye 07/06/2019  . Intermediate stage nonexudative age-related macular degeneration of both eyes 07/06/2019  . Degenerative retinal drusen of left eye 07/06/2019  . Macular degeneration 03/25/2019  . Glaucoma 03/25/2019  . History of food allergy 10/22/2017  . Anaphylaxis 10/22/2017  . Food allergy 09/03/2017  . Mild persistent asthma 09/03/2017  . Spinal stenosis of lumbar region with neurogenic claudication  08/01/2016  . Pure hypercholesterolemia 08/01/2016  . Osteopenia 10/18/2014  . Obesity (BMI 30-39.9) 06/22/2014  . Perennial allergic rhinitis 03/23/2013  . Cancer of central portion of left female breast ([name]) 08/25/2012  . Obstructive sleep apnea on CPAP 04/21/2012  . Diabetes ([name]) 12/28/2011  . HTN (hypertension) 12/28/2011  . Uterine bleeding 04/30/2011   Past Medical History:  Diagnosis Date  . Allergic rhinitis 03/23/2013  . Allergy   . Anemia    due to vaginal bleeding  . Arthritis    hands  . Asthmatic bronchitis 09/03/2017  . Breast cancer ([facility]) 08/2012   left  . Dental crowns present   . Eczema   . Fibroids   . Headache(784.0)    tension  . History of endometriosis   . Hyperlipidemia   . Hypertension    under control with med., has been on med. x 3 yr.  . OSA (obstructive sleep apnea)   . Postmenopausal bleeding   . Recurrent upper respiratory infection (URI)   . S/P radiation therapy 10/20/2012-12/05/2012   left breast 50.4 gray, lumpectomy cavity boosted to 62.4 gray  . Seasonal allergies   . Sleep apnea    uses CPAP nightly  . Urinary urgency   . White coat hypertension    Past Surgical History:  Procedure Laterality Date  . BACK SURGERY  1997   lumbar  . BREAST LUMPECTOMY WITH NEEDLE LOCALIZATION AND AXILLARY SENTINEL LYMPH NODE BX Left 09/03/2012   Procedure: BREAST LUMPECTOMY WITH NEEDLE LOCALIZATION AND AXILLARY SENTINEL LYMPH NODE BX;  Surgeon: Diane Jolly, MD;  Location: [name];  Service: General;  Laterality: Left;  . CHOLECYSTECTOMY  1995  . COLONOSCOPY W/ POLYPECTOMY  03/20/2007  . DILATION AND CURETTAGE OF UTERUS  2005  . DILATION AND CURETTAGE OF UTERUS  06/22/2011   Procedure: DILATATION AND CURETTAGE;  Surgeon: Diane Chapel, MD;  Location: [name];  Service: Gynecology;  Laterality: N/A;  OK PER KEELA FOR 7:15 START  . DORSAL COMPARTMENT RELEASE Right 05/19/2013   Procedure: RELEASE 1ST  DORSAL  COMPARTMENT RIGHT (DEQUERVAIN);  Surgeon: Diane [name]., MD;  Location: [name];  Service: Orthopedics;  Laterality: Right;  . EXPLORATORY LAPAROTOMY  06-30-2007   ATTEMPTED HYSTERECTOMY ABORTED DUE TO EXTENSIVE ENDOMETRIOSIS W/ DENSE PELVIC ADHESIONS INVOLVING UTERUS AND LOWER RECTOSIGMOID  . HYSTEROSCOPY WITH D & C  05/06/2009   Allergies  Allergen Reactions  . Peanut (Diagnostic) Cough, Hypertension and Shortness Of Breath  . Adhesive [Tape] Rash  . Crestor [Rosuvastatin] Other (See Comments)    Severe arthralgia and myalgia  . Latex Itching and Other (See Comments)    Causes blisters  . Lovastatin Other (See Comments)    Arthralgias, hyperglycemia with lovastatin.   Prior to Admission medications   Medication Sig Start Date End Date Taking? Authorizing  Provider  aspirin 81 MG tablet Take 81 mg by mouth daily.   Yes [provider]  Brimonidine Tartrate-Timolol (COMBIGAN OP) Apply to eye.   Yes [provider]  cholecalciferol (VITAMIN D) 1000 units tablet Take 1,000 Units by mouth daily.   Yes [provider]  Cyanocobalamin (VITAMIN B-12 CR PO) Take 500 mcg by mouth daily.    Yes [provider]  ezetimibe (ZETIA) 10 MG tablet Take 1 tablet (10 mg total) by mouth daily after supper. 11/13/19 11/07/20 Yes Adrian Prows, MD  fluticasone (FLONASE) 50 MCG/ACT nasal spray Place 1 spray into both nostrils daily. 09/03/17  Yes Bobbitt, [place] Muta, MD  fluticasone (FLOVENT HFA) 44 MCG/ACT inhaler Inhale 2 puffs into the lungs 2 (two) times daily. With spacer. 10/22/17  Yes Bobbitt, [place] Muta, MD  lisinopril-hydrochlorothiazide (ZESTORETIC) 20-25 MG tablet Take 1 tablet by mouth daily. 11/13/19  Yes Adrian Prows, MD  loratadine (CLARITIN) 10 MG tablet Take 1 tablet (10 mg total) by mouth daily. 05/12/18  Yes Nicholas Lose, MD  metFORMIN (GLUCOPHAGE) 500 MG tablet Take 1 tablet (500 mg total) by mouth in the morning and at bedtime. 09/14/19   Yes Wendie Agreste, MD  metoprolol succinate (TOPROL-XL) 25 MG 24 hr tablet Take 1 tablet (25 mg total) by mouth daily. 04/02/19  Yes Miquel Dunn, NP  pravastatin (PRAVACHOL) 20 MG tablet Take 20 mg by mouth every other day.   Yes [provider]  tamoxifen (NOLVADEX) 10 MG tablet Take 1 tablet (10 mg total) by mouth daily. Patient taking differently: Take 10 mg by mouth every other day.  05/13/19  Yes Nicholas Lose, MD  Turmeric Curcumin 500 MG CAPS Take by mouth.   Yes [provider]  vitamin C (ASCORBIC ACID) 500 MG tablet Take 500 mg by mouth 2 (two) times daily.    Yes [provider]  Zinc 50 MG TABS  12/17/17  Yes [provider]  LISINOPRIL PO Take 1 tablet by mouth daily.  06/13/11  [provider]   Social History   Socioeconomic History  . Marital status: Married    Spouse name: [name]  . Number of children: 4  . Years of education: College  . Highest education level: Not on file  Occupational History  . Occupation: retired  Tobacco Use  . Smoking status: Never Smoker  . Smokeless tobacco: Never Used  Vaping Use  . Vaping Use: Never used  Substance and Sexual Activity  . Alcohol use: No    Alcohol/week: 0.0 standard drinks  . Drug use: No  . Sexual activity: Yes    Comment: menarche 32, P4, Premarin for short while, Provera x 3 years  Other Topics Concern  . Not on file  Social History Narrative   Marital status: married x 73  years; happily married; no abuse.      Children: 4 children (2 sons living; 1 son died in 3725 at age 73years old, 1 daughter); six grandchildren; no gg.      Lives:  With husband.      Employed: retired in 2011; [employer].      Tobacco: none       Alcohol:  None      Drugs: none      Exercise:  Rowing 3 days per week.      Seatbelt:  100%      Sunscreen:  Face SPF 15.      Guns:  Loaded mostly secured.  ADLs:  Drives minimally; no assistant devices;  independent with all ADLs.       Advanced Directives: YES; FULL CODE but no prolonged measures.      Her nocturia is improved under CPAP use at 10 cm water- AHI is now 0.8 and from 30 at baseline. CMS compliance  6 hours 40 minutes - sleeps on the side, 08-10-11 .FFM - likes it.    . Flonase used prn.     Caffeine Use: 1 cup daily   Social Determinants of Health   Financial Resource Strain:   . Difficulty of Paying Living Expenses: Not on file  Food Insecurity:   . Worried About Charity fundraiser in the Last Year: Not on file  . Ran Out of Food in the Last Year: Not on file  Transportation Needs:   . Lack of Transportation (Medical): Not on file  . Lack of Transportation (Non-Medical): Not on file  Physical Activity:   . Days of Exercise per Week: Not on file  . Minutes of Exercise per Session: Not on file  Stress:   . Feeling of Stress : Not on file  Social Connections:   . Frequency of Communication with Friends and Family: Not on file  . Frequency of Social Gatherings with Friends and Family: Not on file  . Attends Religious Services: Not on file  . Active Member of Clubs or Organizations: Not on file  . Attends Archivist Meetings: Not on file  . Marital Status: Not on file  Intimate Partner Violence:   . Fear of Current or Ex-Partner: Not on file  . Emotionally Abused: Not on file  . Physically Abused: Not on file  . Sexually Abused: Not on file    Review of Systems   Objective:   Vitals:   01/13/20 0945  BP: 138/79  Pulse: 90  Temp: 98.2 F (36.8 C)  TempSrc: Temporal  SpO2: 95%  Weight: 225 lb (102.1 kg)  Height: 5\' 1"  (1.549 m)     Physical Exam Vitals reviewed.  Constitutional:      Appearance: She is well-developed. She is obese.  HENT:     Head: Normocephalic and atraumatic.  Eyes:     Conjunctiva/sclera: Conjunctivae normal.     Pupils: Pupils are equal, round, and reactive to light.  Neck:     Vascular: No carotid bruit.    Cardiovascular:     Rate and Rhythm: Normal rate and regular rhythm.     Heart sounds: Normal heart sounds.  Pulmonary:     Effort: Pulmonary effort is normal.     Breath sounds: Normal breath sounds.  Abdominal:     Palpations: Abdomen is soft. There is no pulsatile mass.     Tenderness: There is no abdominal tenderness.  Musculoskeletal:     Comments: C-spine Pain-free range of motion, does not reproduce shoulder symptoms, no midline bony tenderness  Right shoulder full range of motion, no focal bony tenderness, negative empty can, negative Neer, slight discomfort with Hawkins, slight discomfort with resisted internal rotation, none with external rotation.  Left shoulder full range of motion, negative empty can, pain with resisted external rotation, internal rotation okay, negative Neer, minimal discomfort with Hawkins  nvi distally.   Skin:    General: Skin is warm and dry.  Neurological:     Mental Status: She is alert and oriented to person, place, and time.  Psychiatric:        Behavior: Behavior normal.  34 minutes spent during visit, greater than 50% counseling and assimilation of information, chart review, and discussion of plan.    DG Shoulder Right  Result Date: 01/13/2020 CLINICAL DATA:  Pain.  Recent fall EXAM: RIGHT SHOULDER - 2+ VIEW COMPARISON:  October 04, 2014 FINDINGS: Oblique, Y scapular, and axillary images were obtained. A prior fracture of the proximal humerus has healed with alignment anatomic. No acute fracture or dislocation. There is moderately severe generalized joint space narrowing in the glenohumeral and acromioclavicular joint regions. There is mild bony overgrowth along the inferior aspect of the lateral clavicle. No erosions. Visualized right lung clear. IMPRESSION: Interval healing of old trauma involving the proximal humerus. No acute fracture or dislocation. Moderately severe generalized joint space narrowing without erosion. Bony overgrowth along  the inferior aspect of the lateral clavicle potentially places patient at increased risk for impingement syndrome. Electronically Signed   By: Lowella Grip III M.D.   On: 01/13/2020 11:08   DG Shoulder Left  Result Date: 01/13/2020 CLINICAL DATA:  Pain.  Fall 1 month prior EXAM: LEFT SHOULDER - 2+ VIEW COMPARISON:  None. FINDINGS: Oblique, Y scapular, and axillary images were obtained. No acute fracture or dislocation. There is moderately severe narrowing of the acromioclavicular and glenohumeral joints. There is bony overgrowth along the inferior aspect of the lateral left clavicle. No erosive change or intra-articular calcification. Visualized left lung clear. IMPRESSION: Generalized osteoarthritic change, moderately severe. Bony overgrowth along the inferior aspect of the lateral clavicle places patient at potential increased risk for impingement syndrome. No fracture or dislocation. Electronically Signed   By: Lowella Grip III M.D.   On: 01/13/2020 11:06     Assessment & Plan:  Diane [name] is a 74 y.o. female . Essential hypertension - Plan: Comprehensive metabolic panel Nonintractable episodic headache, unspecified headache type  -Prior headache now resolved.  No med changes at this time, RTC precautions if headaches return.  Need for vaccination - Plan: Flu Vaccine QUAD High Dose(Fluad)  Type 2 diabetes mellitus with hyperglycemia, without long-term current use of insulin (HCC) - Plan: Hemoglobin A1c, glucose blood test strip  -Tolerating current regimen, check A1c.  84-month follow-up.  Bilateral shoulder pain, unspecified chronicity - Plan: meloxicam (MOBIC) 7.5 MG tablet, DG Shoulder Left, DG Shoulder Right Rotator cuff tendinitis, unspecified laterality - Plan: meloxicam (MOBIC) 7.5 MG tablet  -Bilateral shoulder pain, ongoing symptoms, including prior to fall.  No acute injury seen on imaging but significant degenerative changes.  Likely component of arthritis plus or  minus component of rotator cuff tendinosis based on testing, but also at risk for impingement based on imaging as above.  Initial trial of meloxicam, but would have low threshold for follow-up with orthopedics.  RTC precautions.  Meds ordered this encounter  Medications  . glucose blood test strip    Sig: Use as instructed    Dispense:  100 each    Refill:  4    One touch verio - check once per day. E11.65.  . meloxicam (MOBIC) 7.5 MG tablet    Sig: Take 1 tablet (7.5 mg total) by mouth daily.    Dispense:  15 tablet    Refill:  0   Patient Instructions    No change in blood pressure meds for now. If headaches return, please follow up to discuss further.   Continue Metformin for diabetes at same dose for now, I will check A1c, recheck 3 months.  Shoulder pain could be combination of arthritis or bursitis,  as well as some irritation of the rotator cuff tendons.  I will check x-ray today with your fall in September but if there are any concerns we will let you know.  Okay to try meloxicam once per day for the next week or 2 but if that has not improved your symptoms would recommend follow-up with Dr. Mardelle [name] again.  Let me know if a referral is needed.  Return to the clinic or go to the nearest emergency room if any of your symptoms worsen or new symptoms occur.    If you have lab work done today you will be contacted with your lab results within the next 2 weeks.  If you have not heard from [place] then please contact us. The fastest way to get your results is to register for My Chart.   IF you received an x-ray today, you will receive an invoice from [facility]. Please contact [facility] at [phone] with questions or concerns regarding your invoice.   IF you received labwork today, you will receive an invoice from [place]. Please contact [facility] at [phone] with questions or concerns regarding your invoice.   Our billing staff will not be able to assist you  with questions regarding bills from these companies.  You will be contacted with the lab results as soon as they are available. The fastest way to get your results is to activate your My Chart account. Instructions are located on the last page of this paperwork. If you have not heard from [place] regarding the results in 2 weeks, please contact this office.         Signed, [name], MD [facility]

## 2020-01-13 NOTE — Patient Instructions (Addendum)
  No change in blood pressure meds for now. If headaches return, please follow up to discuss further.   Continue Metformin for diabetes at same dose for now, I will check A1c, recheck 3 months.  Shoulder pain could be combination of arthritis or bursitis, as well as some irritation of the rotator cuff tendons.  I will check x-ray today with your fall in September but if there are any concerns we will let you know.  Okay to try meloxicam once per day for the next week or 2 but if that has not improved your symptoms would recommend follow-up with Dr. Mardelle Matte again.  Let me know if a referral is needed.  Return to the clinic or go to the nearest emergency room if any of your symptoms worsen or new symptoms occur.    If you have lab work done today you will be contacted with your lab results within the next 2 weeks.  If you have not heard from [place] then please contact us. The fastest way to get your results is to register for My Chart.   IF you received an x-ray today, you will receive an invoice from [facility]. Please contact [facility] at [phone] with questions or concerns regarding your invoice.   IF you received labwork today, you will receive an invoice from [place]. Please contact [facility] at [phone] with questions or concerns regarding your invoice.   Our billing staff will not be able to assist you with questions regarding bills from these companies.  You will be contacted with the lab results as soon as they are available. The fastest way to get your results is to activate your My Chart account. Instructions are located on the last page of this paperwork. If you have not heard from [place] regarding the results in 2 weeks, please contact this office.

## 2020-01-14 ENCOUNTER — Encounter (INDEPENDENT_AMBULATORY_CARE_PROVIDER_SITE_OTHER): Payer: Self-pay | Admitting: Ophthalmology

## 2020-01-14 ENCOUNTER — Ambulatory Visit (INDEPENDENT_AMBULATORY_CARE_PROVIDER_SITE_OTHER): Payer: Medicare Other | Admitting: Ophthalmology

## 2020-01-14 DIAGNOSIS — H353221 Exudative age-related macular degeneration, left eye, with active choroidal neovascularization: Secondary | ICD-10-CM

## 2020-01-14 DIAGNOSIS — H35722 Serous detachment of retinal pigment epithelium, left eye: Secondary | ICD-10-CM

## 2020-01-14 LAB — COMPREHENSIVE METABOLIC PANEL
ALT: 14 IU/L (ref 0–32)
AST: 19 IU/L (ref 0–40)
Albumin/Globulin Ratio: 1.3 (ref 1.2–2.2)
Albumin: 3.9 g/dL (ref 3.7–4.7)
Alkaline Phosphatase: 91 IU/L (ref 44–121)
BUN/Creatinine Ratio: 29 — ABNORMAL HIGH (ref 12–28)
BUN: 23 mg/dL (ref 8–27)
Bilirubin Total: 0.5 mg/dL (ref 0.0–1.2)
CO2: 27 mmol/L (ref 20–29)
Calcium: 10 mg/dL (ref 8.7–10.3)
Chloride: 96 mmol/L (ref 96–106)
Creatinine, Ser: 0.8 mg/dL (ref 0.57–1.00)
GFR calc Af Amer: 83 mL/min/{1.73_m2} (ref >59)
GFR calc non Af Amer: 72 mL/min/{1.73_m2} (ref >59)
Globulin, Total: 2.9 g/dL (ref 1.5–4.5)
Glucose: 157 mg/dL — ABNORMAL HIGH (ref 65–99)
Potassium: 4.5 mmol/L (ref 3.5–5.2)
Sodium: 137 mmol/L (ref 134–144)
Total Protein: 6.8 g/dL (ref 6.0–8.5)

## 2020-01-14 LAB — HEMOGLOBIN A1C
Est. average glucose Bld gHb Est-mCnc: 169 mg/dL
Hgb A1c MFr Bld: 7.5 % — ABNORMAL HIGH (ref 4.8–5.6)

## 2020-01-14 MED ORDER — AFLIBERCEPT 2MG/0.05ML IZ SOLN FOR KALEIDOSCOPE
2.0000 mg | INTRAVITREAL | Status: AC | PRN
  Administered 2020-01-14: 2 mg via INTRAVITREAL

## 2020-01-14 NOTE — Progress Notes (Signed)
01/14/2020     CHIEF COMPLAINT Patient presents for Retina Follow Up   HISTORY OF PRESENT ILLNESS: [name] is a 74 y.o. female who presents to the clinic today for:   HPI    Retina Follow Up    Patient presents with  Wet AMD.  In left eye.  This started 6 weeks ago.  Severity is mild.  Duration of 6 weeks.  Since onset it is stable.          Comments    6 Week AMD F/U OS, poss Eylea OS  Pt denies noticeable changes to [place] OU since last visit. Pt denies ocular pain, flashes of light, or floaters OU.  LBS: 191 this AM       Last edited by Rockie Neighbours, [place] on 01/14/2020  8:14 AM. (History)      Referring physician: Wendie Agreste, MD [address] [place],  [place] [address]  HISTORICAL INFORMATION:   Selected notes from the MEDICAL RECORD NUMBER    Lab Results  Component Value Date   HGBA1C 7.5 (H) 01/13/2020     CURRENT MEDICATIONS: Current Outpatient Medications (Ophthalmic Drugs)  Medication Sig  . Brimonidine Tartrate-Timolol (COMBIGAN OP) Apply to eye.   No current facility-administered medications for this visit. (Ophthalmic Drugs)   Current Outpatient Medications (Other)  Medication Sig  . aspirin 81 MG tablet Take 81 mg by mouth daily.  . cholecalciferol (VITAMIN D) 1000 units tablet Take 1,000 Units by mouth daily.  . Cyanocobalamin (VITAMIN B-12 CR PO) Take 500 mcg by mouth daily.   Marland Kitchen ezetimibe (ZETIA) 10 MG tablet Take 1 tablet (10 mg total) by mouth daily after supper.  . fluticasone (FLONASE) 50 MCG/ACT nasal spray Place 1 spray into both nostrils daily.  . fluticasone (FLOVENT HFA) 44 MCG/ACT inhaler Inhale 2 puffs into the lungs 2 (two) times daily. With spacer.  Marland Kitchen glucose blood test strip Use as instructed  . lisinopril-hydrochlorothiazide (ZESTORETIC) 20-25 MG tablet Take 1 tablet by mouth daily.  Marland Kitchen loratadine (CLARITIN) 10 MG tablet Take 1 tablet (10 mg total) by mouth daily.  . meloxicam (MOBIC) 7.5 MG tablet Take 1 tablet  (7.5 mg total) by mouth daily.  . metFORMIN (GLUCOPHAGE) 500 MG tablet Take 1 tablet (500 mg total) by mouth in the morning and at bedtime.  . metoprolol succinate (TOPROL-XL) 25 MG 24 hr tablet Take 1 tablet (25 mg total) by mouth daily.  . pravastatin (PRAVACHOL) 20 MG tablet Take 20 mg by mouth every other day.  . tamoxifen (NOLVADEX) 10 MG tablet Take 1 tablet (10 mg total) by mouth daily. (Patient taking differently: Take 10 mg by mouth every other day. )  . Turmeric Curcumin 500 MG CAPS Take by mouth.  . vitamin C (ASCORBIC ACID) 500 MG tablet Take 500 mg by mouth 2 (two) times daily.   . Zinc 50 MG TABS    Current Facility-Administered Medications (Other)  Medication Route  . EPINEPHrine (Anaphylaxis) SOLN 0.3 mg Intramuscular      REVIEW OF SYSTEMS:    ALLERGIES Allergies  Allergen Reactions  . Peanut (Diagnostic) Cough, Hypertension and Shortness Of Breath  . Adhesive [Tape] Rash  . Crestor [Rosuvastatin] Other (See Comments)    Severe arthralgia and myalgia  . Latex Itching and Other (See Comments)    Causes blisters  . Lovastatin Other (See Comments)    Arthralgias, hyperglycemia with lovastatin.    PAST MEDICAL HISTORY Past Medical History:  Diagnosis Date  .  Allergic rhinitis 03/23/2013  . Allergy   . Anemia    due to vaginal bleeding  . Arthritis    hands  . Asthmatic bronchitis 09/03/2017  . Breast cancer ([place]) 08/2012   left  . Dental crowns present   . Eczema   . Fibroids   . Headache(784.0)    tension  . History of endometriosis   . Hyperlipidemia   . Hypertension    under control with med., has been on med. x 3 yr.  . OSA (obstructive sleep apnea)   . Postmenopausal bleeding   . Recurrent upper respiratory infection (URI)   . S/P radiation therapy 10/20/2012-12/05/2012   left breast 50.4 gray, lumpectomy cavity boosted to 62.4 gray  . Seasonal allergies   . Sleep apnea    uses CPAP nightly  . Urinary urgency   . White coat hypertension     Past Surgical History:  Procedure Laterality Date  . BACK SURGERY  1997   lumbar  . BREAST LUMPECTOMY WITH NEEDLE LOCALIZATION AND AXILLARY SENTINEL LYMPH NODE BX Left 09/03/2012   Procedure: BREAST LUMPECTOMY WITH NEEDLE LOCALIZATION AND AXILLARY SENTINEL LYMPH NODE BX;  Surgeon: Edward Jolly, MD;  Location: [facility];  Service: General;  Laterality: Left;  . CHOLECYSTECTOMY  1995  . COLONOSCOPY W/ POLYPECTOMY  03/20/2007  . DILATION AND CURETTAGE OF UTERUS  2005  . DILATION AND CURETTAGE OF UTERUS  06/22/2011   Procedure: DILATATION AND CURETTAGE;  Surgeon: Alvino Chapel, MD;  Location: [facility];  Service: Gynecology;  Laterality: N/A;  OK PER KEELA FOR 7:15 START  . DORSAL COMPARTMENT RELEASE Right 05/19/2013   Procedure: RELEASE 1ST  DORSAL COMPARTMENT RIGHT (DEQUERVAIN);  Surgeon: Cammie Sickle., MD;  Location: [facility];  Service: Orthopedics;  Laterality: Right;  . EXPLORATORY LAPAROTOMY  06-30-2007   ATTEMPTED HYSTERECTOMY ABORTED DUE TO EXTENSIVE ENDOMETRIOSIS W/ DENSE PELVIC ADHESIONS INVOLVING UTERUS AND LOWER RECTOSIGMOID  . HYSTEROSCOPY WITH D & C  05/06/2009    FAMILY HISTORY Family History  Problem Relation Age of Onset  . Vision loss Mother   . Hypertension Mother   . Anesthesia problems Mother        post-op N/V  . Heart disease Mother        carotid stenosis s/p CAE  . Osteoporosis Mother   . Eczema Mother   . Depression Father   . Heart disease Sister 18       arrhythmia  . Hypertension Sister   . Hyperlipidemia Sister   . Diabetes Brother   . Hypertension Brother   . Cancer Brother        skin  . Hyperlipidemia Brother   . Stroke Brother   . COPD Daughter   . Atopy Daughter   . Cancer Cousin   . Kidney failure Maternal Grandmother   . Heart disease Maternal Grandfather   . Hypertension Other   . Colon cancer Neg Hx   . Allergic rhinitis Neg Hx   . Asthma Neg Hx   . Urticaria Neg Hx      SOCIAL HISTORY Social History   Tobacco Use  . Smoking status: Never Smoker  . Smokeless tobacco: Never Used  Vaping Use  . Vaping Use: Never used  Substance Use Topics  . Alcohol use: No    Alcohol/week: 0.0 standard drinks  . Drug use: No         OPHTHALMIC EXAM:  Base Eye Exam  Visual Acuity (ETDRS)      Right Left   Dist cc 20/30 -2 20/50 +2   Dist ph cc 20/25 -2 20/25 -2   Correction: Glasses       Tonometry (Tonopen, 8:15 AM)      Right Left   Pressure 17 16       Pupils      Pupils Dark Light Shape React APD   Right PERRL 4 3 Round Slow None   Left PERRL 4 3 Round Slow None       Visual Fields (Counting fingers)      Left Right    Full Full       Extraocular Movement      Right Left    Full Full       Neuro/Psych    Oriented x3: Yes   Mood/Affect: Normal       Dilation    Left eye: 1.0% Mydriacyl, 2.5% Phenylephrine @ 8:18 AM        Slit Lamp and Fundus Exam    External Exam      Right Left   External Normal Normal       Slit Lamp Exam      Right Left   Lids/Lashes Normal Normal   Conjunctiva/Sclera White and quiet White and quiet   Cornea Clear Clear   Anterior Chamber Deep and quiet Deep and quiet   Iris Round and reactive Round and reactive   Lens 2+ Nuclear sclerosis 2+ Nuclear sclerosis   Anterior Vitreous Normal Normal       Fundus Exam      Right Left   Posterior Vitreous  Posterior vitreous detachment   Disc  Peripapillary atrophy   C/D Ratio  0.35   Macula  Retinal pigment epithelial mottling, no exudates, no hemorrhage, no macular thickening, Early age related macular degeneration, Pigmented atrophy   Vessels  Normal   Periphery  Normal          IMAGING AND PROCEDURES  Imaging and Procedures for 01/14/20  OCT, Retina - OU - Both Eyes       Right Eye Quality was good. Scan locations included subfoveal. Central Foveal Thickness: 230. Progression has been stable. Findings include retinal drusen .    Left Eye Quality was good. Scan locations included subfoveal. Central Foveal Thickness: 249. Progression has been stable. Findings include subretinal fluid, abnormal foveal contour, retinal drusen .   Notes OD with no signs of active CNVM or complications of AMD  Subretinal fluid which is increasing on intravitreal Eylea  Since April 2021.  Repeat injection today and will change to intravitreal Avastin OS next visit       Intravitreal Injection, Pharmacologic Agent - OS - Left Eye       Time Out 01/14/2020. 9:08 AM. Confirmed correct patient, procedure, site, and patient consented.   Anesthesia Topical anesthesia was used. Anesthetic medications included [place] 3.5%.   Procedure Preparation included Tobramycin 0.3%, 10% betadine to eyelids, 5% betadine to ocular surface. A 30 gauge needle was used.   Injection:  2 mg aflibercept Alfonse Flavors) SOLN   NDC: [id number], Lot: [id number]   Route: Intravitreal, Site: Left Eye, Waste: 0 mg  Post-op Post injection exam found visual acuity of at least counting fingers. The patient tolerated the procedure well. There were no complications. The patient received written and verbal post procedure care education.                 ASSESSMENT/PLAN:  Exudative age-related macular degeneration of left eye with active choroidal neovascularization (HCC) Persistent subretinal fluid increasing now on repeated usage of intravitreal Eylea.  Likely resistant.  We will repeat today to maintain and stabilize and will change to intravitreal Avastin OS next  Serous detachment of retinal pigment epithelium of left eye Component wet AMD, stable, not resolving on repeated Eylea will need to change medications next      ICD-10-CM   1. Exudative age-related macular degeneration of left eye with active choroidal neovascularization (HCC)  H35.3221 OCT, Retina - OU - Both Eyes    Intravitreal Injection, Pharmacologic Agent - OS - Left Eye    aflibercept  (EYLEA) SOLN 2 mg  2. Serous detachment of retinal pigment epithelium of left eye  H35.722     1.  2.  3.  Ophthalmic Meds Ordered this visit:  Meds ordered this encounter  Medications  . aflibercept (EYLEA) SOLN 2 mg       Return in about 6 weeks (around 02/25/2020) for dilate, OS, AVASTIN OCT, change of medication.  There are no Patient Instructions on file for this visit.   Explained the diagnoses, plan, and follow up with the patient and they expressed understanding.  Patient expressed understanding of the importance of proper follow up care.   Clent Demark Jcion Buddenhagen M.D. Diseases & Surgery of the Retina and Vitreous [facility]ina [facility] Diabetic [place] 01/14/20     Abbreviations: M myopia (nearsighted); A astigmatism; H hyperopia (farsighted); P presbyopia; Mrx spectacle prescription;  CTL contact lenses; OD right eye; OS left eye; OU both eyes  XT exotropia; ET esotropia; PEK punctate epithelial keratitis; PEE punctate epithelial erosions; DES dry eye syndrome; MGD meibomian gland dysfunction; ATs artificial tears; PFAT's preservative free artificial tears; [place] nuclear sclerotic cataract; PSC posterior subcapsular cataract; ERM epi-retinal membrane; PVD posterior vitreous detachment; RD retinal detachment; DM diabetes mellitus; DR diabetic retinopathy; NPDR non-proliferative diabetic retinopathy; PDR proliferative diabetic retinopathy; CSME clinically significant macular edema; DME diabetic macular edema; dbh dot blot hemorrhages; CWS cotton wool spot; POAG primary open angle glaucoma; C/D cup-to-disc ratio; HVF humphrey visual field; GVF goldmann visual field; OCT optical coherence tomography; IOP intraocular pressure; BRVO Branch retinal vein occlusion; CRVO central retinal vein occlusion; CRAO central retinal artery occlusion; BRAO branch retinal artery occlusion; RT retinal tear; SB scleral buckle; PPV pars plana vitrectomy; VH Vitreous hemorrhage; PRP panretinal laser photocoagulation;  IVK intravitreal kenalog; VMT vitreomacular traction; MH Macular hole;  NVD neovascularization of the disc; NVE neovascularization elsewhere; AREDS age related eye disease study; ARMD age related macular degeneration; POAG primary open angle glaucoma; EBMD epithelial/anterior basement membrane dystrophy; ACIOL anterior chamber intraocular lens; IOL intraocular lens; PCIOL posterior chamber intraocular lens; Phaco/IOL phacoemulsification with intraocular lens placement; [place] photorefractive keratectomy; LASIK laser assisted in situ keratomileusis; HTN hypertension; DM diabetes mellitus; COPD chronic obstructive pulmonary disease

## 2020-01-14 NOTE — Assessment & Plan Note (Signed)
Component wet AMD, stable, not resolving on repeated Eylea will need to change medications next

## 2020-01-14 NOTE — Assessment & Plan Note (Signed)
Persistent subretinal fluid increasing now on repeated usage of intravitreal Eylea.  Likely resistant.  We will repeat today to maintain and stabilize and will change to intravitreal Avastin OS next

## 2020-01-15 DIAGNOSIS — Z6841 Body Mass Index (BMI) 40.0 and over, adult: Secondary | ICD-10-CM | POA: Diagnosis not present

## 2020-01-15 DIAGNOSIS — Z01419 Encounter for gynecological examination (general) (routine) without abnormal findings: Secondary | ICD-10-CM | POA: Diagnosis not present

## 2020-01-18 ENCOUNTER — Encounter (INDEPENDENT_AMBULATORY_CARE_PROVIDER_SITE_OTHER): Payer: Self-pay

## 2020-01-18 DIAGNOSIS — H401111 Primary open-angle glaucoma, right eye, mild stage: Secondary | ICD-10-CM | POA: Diagnosis not present

## 2020-01-18 DIAGNOSIS — H40012 Open angle with borderline findings, low risk, left eye: Secondary | ICD-10-CM | POA: Diagnosis not present

## 2020-01-18 DIAGNOSIS — H353221 Exudative age-related macular degeneration, left eye, with active choroidal neovascularization: Secondary | ICD-10-CM | POA: Diagnosis not present

## 2020-01-18 DIAGNOSIS — E119 Type 2 diabetes mellitus without complications: Secondary | ICD-10-CM | POA: Diagnosis not present

## 2020-01-18 DIAGNOSIS — H2513 Age-related nuclear cataract, bilateral: Secondary | ICD-10-CM | POA: Diagnosis not present

## 2020-01-18 DIAGNOSIS — H0102B Squamous blepharitis left eye, upper and lower eyelids: Secondary | ICD-10-CM | POA: Diagnosis not present

## 2020-01-18 DIAGNOSIS — H0102A Squamous blepharitis right eye, upper and lower eyelids: Secondary | ICD-10-CM | POA: Diagnosis not present

## 2020-01-19 ENCOUNTER — Encounter: Payer: Self-pay | Admitting: Family Medicine

## 2020-01-19 ENCOUNTER — Encounter: Payer: Self-pay | Admitting: Hematology and Oncology

## 2020-01-20 NOTE — Telephone Encounter (Signed)
FYI: Pt reports she would like to try staying on the same dose of metformin and changing diet as you suggested.

## 2020-02-04 ENCOUNTER — Ambulatory Visit (INDEPENDENT_AMBULATORY_CARE_PROVIDER_SITE_OTHER): Payer: Medicare Other | Admitting: Neurology

## 2020-02-04 ENCOUNTER — Encounter: Payer: Self-pay | Admitting: Neurology

## 2020-02-04 VITALS — BP 140/81 | HR 86 | Ht 61.0 in | Wt 226.0 lb

## 2020-02-04 DIAGNOSIS — G4733 Obstructive sleep apnea (adult) (pediatric): Secondary | ICD-10-CM

## 2020-02-04 DIAGNOSIS — J4530 Mild persistent asthma, uncomplicated: Secondary | ICD-10-CM | POA: Diagnosis not present

## 2020-02-04 DIAGNOSIS — H353221 Exudative age-related macular degeneration, left eye, with active choroidal neovascularization: Secondary | ICD-10-CM | POA: Diagnosis not present

## 2020-02-04 DIAGNOSIS — Z9989 Dependence on other enabling machines and devices: Secondary | ICD-10-CM

## 2020-02-04 DIAGNOSIS — I10 Essential (primary) hypertension: Secondary | ICD-10-CM

## 2020-02-04 DIAGNOSIS — Z6841 Body Mass Index (BMI) 40.0 and over, adult: Secondary | ICD-10-CM | POA: Diagnosis not present

## 2020-02-04 NOTE — Progress Notes (Signed)
PATIENT: [name] DOB: June 06, 1945  REASON FOR VISIT: follow up- obstructive sleep apnea on CPAP HISTORY FROM: patient-pt here for the initial cpap compliance for her new machine. DME [facility] states working well  INTERVAL HISTORY : I am meeting with [name] on 02/04/20 at  3:30 PM EST , I have the pleasure of meeting with [name] today this is her first visit with me after she received a new machine.  In May of this year on 07/22/2019 she underwent a baseline polysomnogram again and this showed that the patient still had apnea her AHI was 27.4 for the total night and her REM sleep AHI was 5075.9/h so this is a strong REM dependent sleep apnea form all night was spent in supine position so I cannot judge if another position would have increased or decreased her apnea count.  She spent 18 minutes total at below 89% oxygen saturation.  She had no periodic limb movements, her EKG was in normal sinus rhythm.  And she was asked to resume CPAP therapy with a new machine that she was doing for.  The machine was provided by [facility] in [place] after she had a bad experience with [facility].   Her compliance report speaks for 100% compliance by days and hours, with average use at time of 7 hours and 32 minutes nightly. Tthe machine is set for minimum pressure of 6 maximum pressure of 12 to centimeter EPR and an AHI of 1.3/h there are no central apneas arising and no evidence of Cheyne-Stokes respiration has been found.  The 97th percentile pressure is 10.7 cm so we could even increase her maximum pressure a little bit, air leaks are mild to moderate.. She is using the wisp nasal mask and is very happy with it.       Chief Complaint: she states things are going well. She is a beast cancer survivor, she needs to take estrogen blockers for a total of 7 years- that will be October 2021.  She has a new phone system , blocking Robocalls, and may have trouble to get info  from [facility].  Patient states most recent machine is >5 yrs old. she uses that one at home and its a [employer]- [employer]. We were  able [place] get [place] CPAP ResMed [place] med[place]   Her newer machine is now also 75 years old and she needs a new one. She desperately wants to change DMEs. Last sleep study 09/2009 but she got a new dream-station philips machine without retesting in July 2016 , provided without prescription by [facility].   Interval history , left sided Wet Macular Degeneration and right sided Glaucoma,  Dr Midge Aver, Dr. Zadie Rhine. OSA certainly can influence the course of her eye disease.       07-08-2018 RV CD  We discussed the possible new issue of a new CPAP by 7 -2021, this time through [place].  I will follow q 12 month with compliance and Epworth score. The patient had 100% compliance for the last 30 days with an average user time of 8 hours and 18 minutes and 5 seconds.  Each day the machine was used over 4 hours.  The 95th percentile pressure was 10.6 cmH2O the average residual AHI was 4.3/h of sleep, the mean pressure was 8.5 cm of water this current machine has an  a flex setting of 2 cm and minimum pressure of 6 and a maximum pressure of 12 cmH2O.  The ramp time is 15 minutes long.  The humidifier is set at 2 and the heated tube is set at 3.  There needs to be no change in the patient's settings however she would much [place] use [place] then using the current machine a dream station auto CPAP from July 2016.  I will take care of that the next machine will be a ResMed machine again if possible.  It would likely be another auto titration device but I want to be able to tell if she has central or obstructive apneas and to find more out why her AHI is above 3/h.  She does not have significant air leaks by the way.   05-09-2017, CD [name] is  a 74 year old caucasian, married female, here seen in the presence of her husband. She is a breast cancer survivor. She uses nasal pillows. 2 years ago she got a new machine after her first machine broke. At the time she switched from [facility] to nasal.    I received and reviewed the patient CPAP download from her DME company. It appears the patient has used her CPAP nightly for the last 30 days. Each night she uses her machine greater than 4 hours. On average she uses her machine 8 hours and 38 minutes. Her average pressure is 9.6 cm of water. Her residual AHI is 3.9. This is an excellent control of apnea per compliance download. Epworth 4, FSS 28. She reports her hands get numb, and  when in bed her feet will get numb- she had back surgery and had symptoms before ( left sided sciatica- PAIN) .   [name] is a 74 year old female with a history of obstructive sleep apnea on CPAP. She returns today for a compliance download. Unfortunately we are unable to obtain a wireless download. We will have her DME company fax [place] over a 30 day download. The patient states that she continues to use her CPAP nightly. Denies any trouble falling asleep. She continues to feel that the machine works well. She does note that sometimes if she sleeps on her side she will notice that the mask leaks slightly. She does change out her supplies regularly. She reports that she lost her eldest son on February 5. This was unexpected. She denies any new neurological symptoms. Returns today for an evaluation.  HISTORY  11/02/15 [facility]): [name] is a 74 year old female with a history of obstructive sleep apnea on CPAP. She returns today for a 30 day compliance download.   Her download today on 11/02/2015 documents an 87% compliance with an average user time of 6 hours and 37 minutes on all days. The patient's residual AHI has increased to 4.4 which is still good but used to be a little bit lower area to she uses an auto CPAP with a 90's  percent pressure of 9.6 cm water the window of pressures between 6 and 12 cm water. She has a very long ramp time of 30 minutes. She has noticed that she has a little bit more trouble to initiate sleep on her new machine and I think this may be related to the very long ramp time. I will shorten the REM time to 12 minutes.  HISTORY 10/27/13:   [name] is 74 year old female with history of obstructive sleep Apnea on CPAP.  She returns today for a 90 day compliance download. She brought her machine with her today and her reports shows an AHI of 2.2 on auto-titration at 6-12 cm of water. She uses her machine for an average of 6 hours and 22 minutes a night, with 97%compliance. His Epworth score is 4 points was previously 3 . His fatigue severity score is 8 was previously 16. Patient reports that she gets about 6.5 hours of sleep a night. She goes to bed around 11:30 pm and arises between 6:30-7:30am. She denies having trouble falling asleep or staying a sleep. States that she normally does not have to get up at night to urinate. Overall patient feels that CPAP has improved his sleepiness and fatigue. Since the last visit the Patient was diagnosed with breast cancer in June 2014. She is on an estrogen blocker now which has caused her to gain weight. She states she is now cancer free.   REVIEW OF SYSTEMS: Out of a complete 14 system review of symptoms, the patient complains only of the following symptoms, and all other reviewed systems are negative.  How likely are you to doze in the following situations: 0 = not likely, 1 = slight chance, 2 = moderate chance, 3 = high chance  Sitting and Reading? Watching Television? Sitting inactive in a public place (theater or meeting)? Lying down in the afternoon when circumstances permit? Sitting and talking to someone? Sitting quietly after lunch without alcohol? In a car, while stopped for a few minutes in traffic? As a passenger in a car for an hour without  a break?  Total = 2/24   I can't sleep without CPAP ! I sleep well.      ALLERGIES: Allergies  Allergen Reactions   Peanut (Diagnostic) Cough, Hypertension and Shortness Of Breath   Adhesive [Tape] Rash   Crestor [Rosuvastatin] Other (See Comments)    Severe arthralgia and myalgia   Latex Itching and Other (See Comments)    Causes blisters   Lovastatin Other (See Comments)    Arthralgias, hyperglycemia with lovastatin.    HOME MEDICATIONS: Outpatient Medications Prior to Visit  Medication Sig Dispense Refill   aspirin 81 MG tablet Take 81 mg by mouth daily.     Brimonidine Tartrate-Timolol (COMBIGAN OP) Apply to eye.     cholecalciferol (VITAMIN D) 1000 units tablet Take 1,000 Units by mouth daily.     Cyanocobalamin (VITAMIN B-12 CR PO) Take 500 mcg by mouth daily.      ezetimibe (ZETIA) 10 MG tablet Take 1 tablet (10 mg total) by mouth daily after supper. 90 tablet 3   fluticasone (FLONASE) 50 MCG/ACT nasal spray Place 1 spray into both nostrils daily. 16 g 5   fluticasone (FLOVENT HFA) 44 MCG/ACT inhaler Inhale 2 puffs into the lungs 2 (two) times daily. With spacer. 1 Inhaler 5   glucose blood test strip Use as instructed 100 each 4   lisinopril-hydrochlorothiazide (ZESTORETIC) 20-25 MG tablet Take 1 tablet by mouth daily. 90 tablet 3   loratadine (CLARITIN) 10 MG tablet Take 1 tablet (10 mg total) by mouth daily.     metFORMIN (GLUCOPHAGE) 500 MG tablet Take 1 tablet (500 mg total) by mouth in the morning and at bedtime. 180 tablet 1   metoprolol succinate (TOPROL-XL) 25 MG 24 hr tablet Take 1 tablet (25 mg total) by mouth daily. 90 tablet 3   pravastatin (PRAVACHOL) 20 MG tablet Take 20 mg by mouth every other day.     tamoxifen (  NOLVADEX) 10 MG tablet Take 1 tablet (10 mg total) by mouth daily. (Patient taking differently: Take 10 mg by mouth every other day. ) 90 tablet 3   Turmeric Curcumin 500 MG CAPS Take by mouth.     vitamin C (ASCORBIC ACID)  500 MG tablet Take 500 mg by mouth 2 (two) times daily.      Zinc 50 MG TABS      meloxicam (MOBIC) 7.5 MG tablet Take 1 tablet (7.5 mg total) by mouth daily. 15 tablet 0   Facility-Administered Medications Prior to Visit  Medication Dose Route Frequency Provider Last Rate Last Admin   EPINEPHrine (Anaphylaxis) SOLN 0.3 mg  0.3 mg Intramuscular Once Bobbitt, Sedalia Muta, MD        PAST MEDICAL HISTORY: Past Medical History:  Diagnosis Date   Allergic rhinitis 03/23/2013   Allergy    Anemia    due to vaginal bleeding   Arthritis    hands   Asthmatic bronchitis 09/03/2017   Breast cancer ([place]) 08/2012   left   Dental crowns present    Eczema    Fibroids    Headache(784.0)    tension   History of endometriosis    Hyperlipidemia    Hypertension    under control with med., has been on med. x 3 yr.   OSA (obstructive sleep apnea)    Postmenopausal bleeding    Recurrent upper respiratory infection (URI)    S/P radiation therapy 10/20/2012-12/05/2012   left breast 50.4 gray, lumpectomy cavity boosted to 62.4 gray   Seasonal allergies    Sleep apnea    uses CPAP nightly   Urinary urgency    White coat hypertension     PAST SURGICAL HISTORY: Past Surgical History:  Procedure Laterality Date   BACK SURGERY  1997   lumbar   BREAST LUMPECTOMY WITH NEEDLE LOCALIZATION AND AXILLARY SENTINEL LYMPH NODE BX Left 09/03/2012   Procedure: BREAST LUMPECTOMY WITH NEEDLE LOCALIZATION AND AXILLARY SENTINEL LYMPH NODE BX;  Surgeon: Edward Jolly, MD;  Location: [facility];  Service: General;  Laterality: Left;   CHOLECYSTECTOMY  1995   COLONOSCOPY W/ POLYPECTOMY  03/20/2007   DILATION AND CURETTAGE OF UTERUS  2005   DILATION AND CURETTAGE OF UTERUS  06/22/2011   Procedure: DILATATION AND CURETTAGE;  Surgeon: Alvino Chapel, MD;  Location: [facility];  Service: Gynecology;  Laterality: N/A;  OK PER KEELA FOR 7:15 START    DORSAL COMPARTMENT RELEASE Right 05/19/2013   Procedure: RELEASE 1ST  DORSAL COMPARTMENT RIGHT (DEQUERVAIN);  Surgeon: Cammie Sickle., MD;  Location: [facility];  Service: Orthopedics;  Laterality: Right;   EXPLORATORY LAPAROTOMY  06-30-2007   ATTEMPTED HYSTERECTOMY ABORTED DUE TO EXTENSIVE ENDOMETRIOSIS W/ DENSE PELVIC ADHESIONS INVOLVING UTERUS AND LOWER RECTOSIGMOID   HYSTEROSCOPY WITH D & C  05/06/2009    FAMILY HISTORY: Family History  Problem Relation Age of Onset   Vision loss Mother    Hypertension Mother    Anesthesia problems Mother        post-op N/V   Heart disease Mother        carotid stenosis s/p CAE   Osteoporosis Mother    Eczema Mother    Depression Father    Heart disease Sister 73       arrhythmia   Hypertension Sister    Hyperlipidemia Sister    Diabetes Brother    Hypertension Brother    Cancer Brother  skin   Hyperlipidemia Brother    Stroke Brother    COPD Daughter    Atopy Daughter    Cancer Cousin    Kidney failure Maternal Grandmother    Heart disease Maternal Grandfather    Hypertension Other    Colon cancer Neg Hx    Allergic rhinitis Neg Hx    Asthma Neg Hx    Urticaria Neg Hx     SOCIAL HISTORY: Social History   Socioeconomic History   Marital status: Married    Spouse name: [name]   Number of children: 4   Years of education: [employer] education level: Not on file  Occupational History   Occupation: retired  Tobacco Use   Smoking status: Never Smoker   Smokeless tobacco: Never Used  Scientific laboratory technician Use: Never used  Substance and Sexual Activity   Alcohol use: No    Alcohol/week: 0.0 standard drinks   Drug use: No   Sexual activity: Yes    Comment: menarche 54, P4, Premarin for short while, Provera x 3 years  Other Topics Concern   Not on file  Social History Narrative   Marital status: married x 28  years; happily married; no abuse.       Children: 4 children (2 sons living; 1 son died in [id number] at age 34years old, 1 daughter); six grandchildren; no gg.      Lives:  With husband.      Employed: retired in 2011; [employer].      Tobacco: none       Alcohol:  None      Drugs: none      Exercise:  Rowing 3 days per week.      Seatbelt:  100%      Sunscreen:  Face SPF 15.      Guns:  Loaded mostly secured.      ADLs:  Drives minimally; no assistant devices; independent with all ADLs.       Advanced Directives: YES; FULL CODE but no prolonged measures.      Her nocturia is improved under CPAP use at 10 cm water- AHI is now 0.8 and from 30 at baseline. CMS compliance  6 hours 40 minutes - sleeps on the side, 08-10-11 .FFM - likes it.    . Flonase used prn.     Caffeine Use: 1 cup daily   Social Determinants of Health   Financial Resource Strain:    Difficulty of Paying Living Expenses: Not on file  Food Insecurity:    Worried About Charity fundraiser in the Last Year: Not on file   [employer] of Food in the Last Year: Not on file  Transportation Needs:    Lack of Transportation (Medical): Not on file   Lack of Transportation (Non-Medical): Not on file  Physical Activity:    Days of Exercise per Week: Not on file   Minutes of Exercise per Session: Not on file  Stress:    Feeling of Stress : Not on file  Social Connections:    Frequency of Communication with Friends and Family: Not on file   Frequency of Social Gatherings with Friends and Family: Not on file   Attends Religious Services: Not on file   Active Member of Clubs or Organizations: Not on file   Attends Archivist Meetings: Not on file   Marital Status: Not on file  Intimate Partner Violence:    Fear of Current  or Ex-Partner: Not on file   Emotionally Abused: Not on file   Physically Abused: Not on file   Sexually Abused: Not on file   Peanut allergy- latex allergy.    Vitals:   02/04/20 1542  BP: 140/81    Pulse: 86  Weight: 226 lb (102.5 kg)  Height: 5\' 1"  (1.549 m)   Body mass index is 42.7 kg/m.  Left breast mastectomy.  Lymphedema, in left arm.  Ankle edema noted.  Neck Circumference : 17 inches, Mallampati 2-   Generalized: Well developed, in no acute distress, well groomed.   Neurological examination  Mentation: Alert oriented to time, place, history taking.  Follows all commands speech and language fluent Cranial nerve:  Taste and smell have recovered- impaired by chemotherapy. . Pupils are equally round and responsive.  Extraocular movements were fully intact.  Uvula and tongue move in midline.  Head turning and shoulder shrug  were normal and symmetric.  Motor: restricted  ROM in all proximal 4 extremities. Pain, myalgia -  Symmetric grip, no tremors, . Coordination:  finger-nose bilaterally intact, no tremor and no ataxia.   Gait and station: Gait is wide based. This is obesity related.  Balance problems when getting up in AM.   DIAGNOSTIC DATA (LABS, IMAGING, TESTING) - I reviewed patient records, labs, notes, testing and imaging myself where available.   Lab Results  Component Value Date   CHOL 158 11/03/2019   HDL 37 (L) 11/03/2019   LDLCALC 83 11/03/2019   TRIG 230 (H) 11/03/2019   CHOLHDL 3.4 07/01/2019   Lab Results  Component Value Date   HGBA1C 7.5 (H) 01/13/2020    Lab Results  Component Value Date   TSH 1.570 12/11/2016     I discussed the assessment and treatment plan with the patient. The patient was provided an opportunity to ask questions and all were answered. The patient agreed with the plan and demonstrated an understanding of the instructions. She is ready for the third covid shot.     I provided 28 minutes of face-to-face time during this encounter.   Larey Seat, MD  1. Obstructive sleep apnea on CPAP. Machine is not longer functioning well, albeit the patient has h always ad a high compliance.   2.  Main risk factor -  obesity.  3 Left Breast cancer/ survivor, status post radiation. Cold sensitive , gaining weight. Lymphedema in lef upper extremity.   DME is  [facility] in [place]  RV in 12 month   Larey Seat, MD  02/04/2020, 3:59 PM [facility] [address] [place], [place] [address] [phone]

## 2020-02-25 ENCOUNTER — Encounter (INDEPENDENT_AMBULATORY_CARE_PROVIDER_SITE_OTHER): Payer: Self-pay | Admitting: Ophthalmology

## 2020-02-25 ENCOUNTER — Other Ambulatory Visit: Payer: Self-pay

## 2020-02-25 ENCOUNTER — Ambulatory Visit (INDEPENDENT_AMBULATORY_CARE_PROVIDER_SITE_OTHER): Payer: Medicare Other | Admitting: Ophthalmology

## 2020-02-25 DIAGNOSIS — H353221 Exudative age-related macular degeneration, left eye, with active choroidal neovascularization: Secondary | ICD-10-CM | POA: Diagnosis not present

## 2020-02-25 DIAGNOSIS — H353132 Nonexudative age-related macular degeneration, bilateral, intermediate dry stage: Secondary | ICD-10-CM | POA: Diagnosis not present

## 2020-02-25 MED ORDER — BEVACIZUMAB 2.5 MG/0.1ML IZ SOSY
2.5000 mg | PREFILLED_SYRINGE | INTRAVITREAL | Status: AC | PRN
  Administered 2020-02-25: 2.5 mg via INTRAVITREAL

## 2020-02-25 NOTE — Assessment & Plan Note (Signed)
OD with no signs of CNVM

## 2020-02-25 NOTE — Patient Instructions (Signed)
Patient instructed to contact the office promptly for new onset visual acuity declines or distortions 

## 2020-02-25 NOTE — Progress Notes (Signed)
02/25/2020     CHIEF COMPLAINT Patient presents for Retina Follow Up   HISTORY OF PRESENT ILLNESS: [name] is a 74 y.o. female who presents to the clinic today for:   HPI    Retina Follow Up    Patient presents with  Wet AMD.  In left eye.  Severity is moderate.  Duration of 6 weeks.  Since onset it is stable.  I, the attending physician,  performed the HPI with the patient and updated documentation appropriately.          Comments    6 Week Wet AMD f\u OS. Possible Avastin OS. OCT  Pt states vision is not good. Dr. Midge Aver will be performing cataract sx on OS next Thursday. Using gtts as directed. BGL: 181 this AM A1C: 7.5       Last edited by Tilda Franco on 02/25/2020  9:25 AM. (History)      Referring physician: Wendie Agreste, MD [address] [place],  [place] [address]  HISTORICAL INFORMATION:   Selected notes from the MEDICAL RECORD NUMBER    Lab Results  Component Value Date   HGBA1C 7.5 (H) 01/13/2020     CURRENT MEDICATIONS: Current Outpatient Medications (Ophthalmic Drugs)  Medication Sig  . Brimonidine Tartrate-Timolol (COMBIGAN OP) Apply to eye.   No current facility-administered medications for this visit. (Ophthalmic Drugs)   Current Outpatient Medications (Other)  Medication Sig  . aspirin 81 MG tablet Take 81 mg by mouth daily.  . cholecalciferol (VITAMIN D) 1000 units tablet Take 1,000 Units by mouth daily.  . Cyanocobalamin (VITAMIN B-12 CR PO) Take 500 mcg by mouth daily.   Marland Kitchen ezetimibe (ZETIA) 10 MG tablet Take 1 tablet (10 mg total) by mouth daily after supper.  . fluticasone (FLONASE) 50 MCG/ACT nasal spray Place 1 spray into both nostrils daily.  . fluticasone (FLOVENT HFA) 44 MCG/ACT inhaler Inhale 2 puffs into the lungs 2 (two) times daily. With spacer.  Marland Kitchen glucose blood test strip Use as instructed  . lisinopril-hydrochlorothiazide (ZESTORETIC) 20-25 MG tablet Take 1 tablet by mouth daily.  Marland Kitchen loratadine  (CLARITIN) 10 MG tablet Take 1 tablet (10 mg total) by mouth daily.  . metFORMIN (GLUCOPHAGE) 500 MG tablet Take 1 tablet (500 mg total) by mouth in the morning and at bedtime.  . metoprolol succinate (TOPROL-XL) 25 MG 24 hr tablet Take 1 tablet (25 mg total) by mouth daily.  . pravastatin (PRAVACHOL) 20 MG tablet Take 20 mg by mouth every other day.  . tamoxifen (NOLVADEX) 10 MG tablet Take 1 tablet (10 mg total) by mouth daily. (Patient taking differently: Take 10 mg by mouth every other day. )  . Turmeric Curcumin 500 MG CAPS Take by mouth.  . vitamin C (ASCORBIC ACID) 500 MG tablet Take 500 mg by mouth 2 (two) times daily.   . Zinc 50 MG TABS    Current Facility-Administered Medications (Other)  Medication Route  . EPINEPHrine (Anaphylaxis) SOLN 0.3 mg Intramuscular      REVIEW OF SYSTEMS: ROS    Positive for: Endocrine   Last edited by Tilda Franco on 02/25/2020  9:17 AM. (History)       ALLERGIES Allergies  Allergen Reactions  . Peanut (Diagnostic) Cough, Hypertension and Shortness Of Breath  . Adhesive [Tape] Rash  . Crestor [Rosuvastatin] Other (See Comments)    Severe arthralgia and myalgia  . Latex Itching and Other (See Comments)    Causes blisters  . Lovastatin  Other (See Comments)    Arthralgias, hyperglycemia with lovastatin.    PAST MEDICAL HISTORY Past Medical History:  Diagnosis Date  . Allergic rhinitis 03/23/2013  . Allergy   . Anemia    due to vaginal bleeding  . Arthritis    hands  . Asthmatic bronchitis 09/03/2017  . Breast cancer ([place]) 08/2012   left  . Dental crowns present   . Eczema   . Fibroids   . Headache(784.0)    tension  . History of endometriosis   . Hyperlipidemia   . Hypertension    under control with med., has been on med. x 3 yr.  . OSA (obstructive sleep apnea)   . Postmenopausal bleeding   . Recurrent upper respiratory infection (URI)   . S/P radiation therapy 10/20/2012-12/05/2012   left breast 50.4 gray, lumpectomy  cavity boosted to 62.4 gray  . Seasonal allergies   . Sleep apnea    uses CPAP nightly  . Urinary urgency   . White coat hypertension    Past Surgical History:  Procedure Laterality Date  . BACK SURGERY  1997   lumbar  . BREAST LUMPECTOMY WITH NEEDLE LOCALIZATION AND AXILLARY SENTINEL LYMPH NODE BX Left 09/03/2012   Procedure: BREAST LUMPECTOMY WITH NEEDLE LOCALIZATION AND AXILLARY SENTINEL LYMPH NODE BX;  Surgeon: Edward Jolly, MD;  Location: [facility];  Service: General;  Laterality: Left;  . CHOLECYSTECTOMY  1995  . COLONOSCOPY W/ POLYPECTOMY  03/20/2007  . DILATION AND CURETTAGE OF UTERUS  2005  . DILATION AND CURETTAGE OF UTERUS  06/22/2011   Procedure: DILATATION AND CURETTAGE;  Surgeon: Alvino Chapel, MD;  Location: [facility];  Service: Gynecology;  Laterality: N/A;  OK PER KEELA FOR 7:15 START  . DORSAL COMPARTMENT RELEASE Right 05/19/2013   Procedure: RELEASE 1ST  DORSAL COMPARTMENT RIGHT (DEQUERVAIN);  Surgeon: Cammie Sickle., MD;  Location: [facility];  Service: Orthopedics;  Laterality: Right;  . EXPLORATORY LAPAROTOMY  06-30-2007   ATTEMPTED HYSTERECTOMY ABORTED DUE TO EXTENSIVE ENDOMETRIOSIS W/ DENSE PELVIC ADHESIONS INVOLVING UTERUS AND LOWER RECTOSIGMOID  . HYSTEROSCOPY WITH D & C  05/06/2009    FAMILY HISTORY Family History  Problem Relation Age of Onset  . Vision loss Mother   . Hypertension Mother   . Anesthesia problems Mother        post-op N/V  . Heart disease Mother        carotid stenosis s/p CAE  . Osteoporosis Mother   . Eczema Mother   . Depression Father   . Heart disease Sister 18       arrhythmia  . Hypertension Sister   . Hyperlipidemia Sister   . Diabetes Brother   . Hypertension Brother   . Cancer Brother        skin  . Hyperlipidemia Brother   . Stroke Brother   . COPD Daughter   . Atopy Daughter   . Cancer Cousin   . Kidney failure Maternal Grandmother   . Heart disease  Maternal Grandfather   . Hypertension Other   . Colon cancer Neg Hx   . Allergic rhinitis Neg Hx   . Asthma Neg Hx   . Urticaria Neg Hx     SOCIAL HISTORY Social History   Tobacco Use  . Smoking status: Never Smoker  . Smokeless tobacco: Never Used  Vaping Use  . Vaping Use: Never used  Substance Use Topics  . Alcohol use: No    Alcohol/week:  0.0 standard drinks  . Drug use: No         OPHTHALMIC EXAM:  Base Eye Exam    Visual Acuity (Snellen - Linear)      Right Left   Dist cc 20/30 + 20/40 -1   Dist ph cc  20/25 -1   Correction: Glasses       Tonometry (Tonopen, 9:29 AM)      Right Left   Pressure 17 16       Pupils      Pupils Dark Light Shape React APD   Right PERRL 4 3 Round Slow None   Left PERRL 4 3 Round Slow None       Visual Fields (Counting fingers)      Left Right    Full Full       Neuro/Psych    Oriented x3: Yes   Mood/Affect: Normal       Dilation    Left eye: 1.0% Mydriacyl, 2.5% Phenylephrine @ 9:29 AM        Slit Lamp and Fundus Exam    External Exam      Right Left   External Normal Normal       Slit Lamp Exam      Right Left   Lids/Lashes Normal Normal   Conjunctiva/Sclera White and quiet White and quiet   Cornea Clear Clear   Anterior Chamber Deep and quiet Deep and quiet   Iris Round and reactive Round and reactive   Lens 2+ Nuclear sclerosis 2+ Nuclear sclerosis   Anterior Vitreous Normal Normal       Fundus Exam      Right Left   Posterior Vitreous  Posterior vitreous detachment   Disc  Peripapillary atrophy   C/D Ratio  0.35   Macula  Retinal pigment epithelial mottling, no exudates, no hemorrhage,  macular thickening, Early age related macular degeneration, Pigmented atrophy, nonturbid subretinal fluid   Vessels  Normal   Periphery  Normal          IMAGING AND PROCEDURES  Imaging and Procedures for 02/25/20  OCT, Retina - OU - Both Eyes       Right Eye Quality was good. Scan locations included  subfoveal. Central Foveal Thickness: 237. Progression has been stable. Findings include normal foveal contour.   Left Eye Quality was good. Scan locations included subfoveal. Central Foveal Thickness: 273. Findings include abnormal foveal contour, subretinal fluid.   Notes Chronic subretinal fluid OS on intravitreal Avastin, currently 6-week interval, with good acuity overall stable       Intravitreal Injection, Pharmacologic Agent - OS - Left Eye       Time Out 02/25/2020. 10:04 AM. Confirmed correct patient, procedure, site, and patient consented.   Anesthesia Topical anesthesia was used. Anesthetic medications included Akten 3.5%.   Procedure Preparation included Tobramycin 0.3%, 10% betadine to eyelids, 5% betadine to ocular surface. A 30 gauge needle was used.   Injection:  2.5 mg Bevacizumab (AVASTIN) 2.5mg /0.62mL SOSY   NDC: 27253-664-40, Lot: [id number]   Route: Intravitreal, Site: Left Eye  Post-op Post injection exam found visual acuity of at least counting fingers. The patient tolerated the procedure well. There were no complications. The patient received written and verbal post procedure care education.                 ASSESSMENT/PLAN:  Exudative age-related macular degeneration of left eye with active choroidal neovascularization (HCC) Activity denoted by chronic active subretinal fluid, no intraretinal fluid,  preservation of good acuity.  Stable overall at 6-week interval.  Repeat intravitreal Avastin today  1 day could consider fluorescein guided micropulse subthreshold laser to sick RPE or focal lesions  Intermediate stage nonexudative age-related macular degeneration of both eyes OD with no signs of CNVM      ICD-10-CM   1. Exudative age-related macular degeneration of left eye with active choroidal neovascularization (HCC)  H35.3221 OCT, Retina - OU - Both Eyes    Intravitreal Injection, Pharmacologic Agent - OS - Left Eye    bevacizumab (AVASTIN)  SOSY 2.5 mg  2. Intermediate stage nonexudative age-related macular degeneration of both eyes  H35.3132     1.  Repeat intravitreal Avastin OS today, examination again in 6 weeks  2.  3.  Ophthalmic Meds Ordered this visit:  Meds ordered this encounter  Medications  . bevacizumab (AVASTIN) SOSY 2.5 mg       Return in about 6 weeks (around 04/07/2020) for dilate, OS, AVASTIN OCT.  Patient Instructions  Patient instructed to contact the office promptly for new onset visual acuity declines or distortions    Explained the diagnoses, plan, and follow up with the patient and they expressed understanding.  Patient expressed understanding of the importance of proper follow up care.   Clent Demark Olga Seyler M.D. Diseases & Surgery of the Retina and Vitreous [facility] [place] 02/25/20     Abbreviations: M myopia (nearsighted); A astigmatism; H hyperopia (farsighted); P presbyopia; Mrx spectacle prescription;  CTL contact lenses; OD right eye; OS left eye; OU both eyes  XT exotropia; ET esotropia; PEK punctate epithelial keratitis; PEE punctate epithelial erosions; DES dry eye syndrome; MGD meibomian gland dysfunction; ATs artificial tears; PFAT's preservative free artificial tears; [place] nuclear sclerotic cataract; PSC posterior subcapsular cataract; ERM epi-retinal membrane; PVD posterior vitreous detachment; RD retinal detachment; DM diabetes mellitus; DR diabetic retinopathy; NPDR non-proliferative diabetic retinopathy; PDR proliferative diabetic retinopathy; CSME clinically significant macular edema; DME diabetic macular edema; dbh dot blot hemorrhages; CWS cotton wool spot; POAG primary open angle glaucoma; C/D cup-to-disc ratio; HVF humphrey visual field; GVF goldmann visual field; OCT optical coherence tomography; IOP intraocular pressure; BRVO Branch retinal vein occlusion; CRVO central retinal vein occlusion; CRAO central retinal artery occlusion; BRAO branch retinal artery  occlusion; RT retinal tear; SB scleral buckle; PPV pars plana vitrectomy; VH Vitreous hemorrhage; PRP panretinal laser photocoagulation; IVK intravitreal kenalog; VMT vitreomacular traction; MH Macular hole;  NVD neovascularization of the disc; NVE neovascularization elsewhere; AREDS age related eye disease study; ARMD age related macular degeneration; POAG primary open angle glaucoma; EBMD epithelial/anterior basement membrane dystrophy; ACIOL anterior chamber intraocular lens; IOL intraocular lens; PCIOL posterior chamber intraocular lens; Phaco/IOL phacoemulsification with intraocular lens placement; [place] photorefractive keratectomy; LASIK laser assisted in situ keratomileusis; HTN hypertension; DM diabetes mellitus; COPD chronic obstructive pulmonary disease

## 2020-02-25 NOTE — Assessment & Plan Note (Signed)
Activity denoted by chronic active subretinal fluid, no intraretinal fluid, preservation of good acuity.  Stable overall at 6-week interval.  Repeat intravitreal Avastin today  1 day could consider fluorescein guided micropulse subthreshold laser to sick RPE or focal lesions

## 2020-02-29 ENCOUNTER — Other Ambulatory Visit: Payer: Self-pay

## 2020-02-29 ENCOUNTER — Other Ambulatory Visit: Payer: Medicare Other

## 2020-02-29 DIAGNOSIS — Z20822 Contact with and (suspected) exposure to covid-19: Secondary | ICD-10-CM | POA: Diagnosis not present

## 2020-03-01 ENCOUNTER — Encounter: Payer: Self-pay | Admitting: Family Medicine

## 2020-03-01 LAB — NOVEL CORONAVIRUS, NAA: SARS-CoV-2, NAA: DETECTED — AB

## 2020-03-01 LAB — SARS-COV-2, NAA 2 DAY TAT

## 2020-03-02 ENCOUNTER — Other Ambulatory Visit: Payer: Self-pay

## 2020-03-02 ENCOUNTER — Other Ambulatory Visit: Payer: Self-pay | Admitting: Nurse Practitioner

## 2020-03-02 ENCOUNTER — Encounter: Payer: Self-pay | Admitting: Family Medicine

## 2020-03-02 ENCOUNTER — Telehealth (INDEPENDENT_AMBULATORY_CARE_PROVIDER_SITE_OTHER): Payer: Medicare Other | Admitting: Family Medicine

## 2020-03-02 ENCOUNTER — Telehealth: Payer: Self-pay | Admitting: Nurse Practitioner

## 2020-03-02 VITALS — Temp 99.8°F | Ht 61.0 in | Wt 223.0 lb

## 2020-03-02 DIAGNOSIS — E1165 Type 2 diabetes mellitus with hyperglycemia: Secondary | ICD-10-CM

## 2020-03-02 DIAGNOSIS — Z170 Estrogen receptor positive status [ER+]: Secondary | ICD-10-CM

## 2020-03-02 DIAGNOSIS — E7800 Pure hypercholesterolemia, unspecified: Secondary | ICD-10-CM

## 2020-03-02 DIAGNOSIS — Z9989 Dependence on other enabling machines and devices: Secondary | ICD-10-CM

## 2020-03-02 DIAGNOSIS — U071 COVID-19: Secondary | ICD-10-CM

## 2020-03-02 DIAGNOSIS — I10 Essential (primary) hypertension: Secondary | ICD-10-CM

## 2020-03-02 DIAGNOSIS — J4530 Mild persistent asthma, uncomplicated: Secondary | ICD-10-CM

## 2020-03-02 MED ORDER — BENZONATATE 100 MG PO CAPS: 100.0000 mg | ORAL_CAPSULE | Freq: Three times a day (TID) | ORAL | 0 refills | Status: DC | PRN

## 2020-03-02 NOTE — Progress Notes (Signed)
Virtual Visit via Telephone Note  I connected with [name] on 03/02/20 at 6:13 PM by telephone and verified that I am speaking with the correct person using two identifiers. Patient location: home  My location: office   I discussed the limitations, risks, security and privacy concerns of performing an evaluation and management service by telephone and the availability of in person appointments. I also discussed with the patient that there may be a patient responsible charge related to this service. The patient expressed understanding and agreed to proceed, consent obtained  Chief complaint: Chief Complaint  Patient presents with   Covid Positive    Pt got her positive results as of yesterday. Pt reports here current symptoms are cough, body aches, brain fog, and some SOB. Pt would like to be put on the infusion for a treatment.     History of Present Illness: [name] is a 74 y.o. female  Covid 54 infection: Started with symptoms 12/11- general malaise overnight. Proceeded to fever, cough next day. tmax 101.8 on first day. Soreness all over with cough. Cough felt like it was getting a little bit better past few days, comes and goes. persistent headache - head doesn't feel right, but no confusion.  Had covid test 2 days ago. Called with positive results last night. Took leftover tessalon 2 nights ago. Some improvement.  dyspnea with coughing fits, not at rest or with activity.  Drinking hot cider, warm drinks help. Drinking some water. No vomiting. No diarrhea. Has been contacted about monoclonal antibody infusion - scheduled for 2:30pm tomorrow with Will have transportation.   Has asthma - not wheezing with current illness. Has flovent and albuterol. inhaer if needed.   Covid vaccine 3/4 and 3/23. Has not had booster. covid risk of complication score 7.     Patient Active Problem List   Diagnosis Date Noted   Nuclear sclerotic cataract of both eyes 12/02/2019    Class 3 severe obesity due to excess calories with serious comorbidity and body mass index (BMI) of 40.0 to 44.9 in adult ([place]) 07/09/2019   OSA on CPAP 07/09/2019   Glaucoma of right eye 07/09/2019   Exudative age-related macular degeneration of left eye with active choroidal neovascularization ([place]) 07/06/2019   Serous detachment of retinal pigment epithelium of left eye 07/06/2019   Intermediate stage nonexudative age-related macular degeneration of both eyes 07/06/2019   Degenerative retinal drusen of left eye 07/06/2019   Macular degeneration 03/25/2019   Glaucoma 03/25/2019   History of food allergy 10/22/2017   Anaphylaxis 10/22/2017   Food allergy 09/03/2017   Mild persistent asthma 09/03/2017   Spinal stenosis of lumbar region with neurogenic claudication 08/01/2016   Pure hypercholesterolemia 08/01/2016   Osteopenia 10/18/2014   Obesity (BMI 30-39.9) 06/22/2014   Perennial allergic rhinitis 03/23/2013   Cancer of central portion of left female breast ([place]) 08/25/2012   Obstructive sleep apnea on CPAP 04/21/2012   Diabetes ([place]) 12/28/2011   HTN (hypertension) 12/28/2011   Uterine bleeding 04/30/2011   Past Medical History:  Diagnosis Date   Allergic rhinitis 03/23/2013   Allergy    Anemia    due to vaginal bleeding   Arthritis    hands   Asthmatic bronchitis 09/03/2017   Breast cancer ([place]) 08/2012   left   Dental crowns present    Eczema    Fibroids    Headache(784.0)    tension   History of endometriosis    Hyperlipidemia    Hypertension  under control with med., has been on med. x 3 yr.   OSA (obstructive sleep apnea)    Postmenopausal bleeding    Recurrent upper respiratory infection (URI)    S/P radiation therapy 10/20/2012-12/05/2012   left breast 50.4 gray, lumpectomy cavity boosted to 62.4 gray   Seasonal allergies    Sleep apnea    uses CPAP nightly   Urinary urgency    White coat hypertension    Past  Surgical History:  Procedure Laterality Date   BACK SURGERY  1997   lumbar   BREAST LUMPECTOMY WITH NEEDLE LOCALIZATION AND AXILLARY SENTINEL LYMPH NODE BX Left 09/03/2012   Procedure: BREAST LUMPECTOMY WITH NEEDLE LOCALIZATION AND AXILLARY SENTINEL LYMPH NODE BX;  Surgeon: Edward Jolly, MD;  Location: [facility];  Service: General;  Laterality: Left;   CHOLECYSTECTOMY  1995   COLONOSCOPY W/ POLYPECTOMY  03/20/2007   DILATION AND CURETTAGE OF UTERUS  2005   DILATION AND CURETTAGE OF UTERUS  06/22/2011   Procedure: DILATATION AND CURETTAGE;  Surgeon: Alvino Chapel, MD;  Location: [facility];  Service: Gynecology;  Laterality: N/A;  OK PER KEELA FOR 7:15 START   DORSAL COMPARTMENT RELEASE Right 05/19/2013   Procedure: RELEASE 1ST  DORSAL COMPARTMENT RIGHT (DEQUERVAIN);  Surgeon: Cammie Sickle., MD;  Location: [facility];  Service: Orthopedics;  Laterality: Right;   EXPLORATORY LAPAROTOMY  06-30-2007   ATTEMPTED HYSTERECTOMY ABORTED DUE TO EXTENSIVE ENDOMETRIOSIS W/ DENSE PELVIC ADHESIONS INVOLVING UTERUS AND LOWER RECTOSIGMOID   HYSTEROSCOPY WITH D & C  05/06/2009   Allergies  Allergen Reactions   Peanut (Diagnostic) Cough, Hypertension and Shortness Of Breath   Adhesive [Tape] Rash   Crestor [Rosuvastatin] Other (See Comments)    Severe arthralgia and myalgia   Latex Itching and Other (See Comments)    Causes blisters   Lovastatin Other (See Comments)    Arthralgias, hyperglycemia with lovastatin.   Prior to Admission medications   Medication Sig Start Date End Date Taking? Authorizing Provider  aspirin 81 MG tablet Take 81 mg by mouth daily.   Yes [provider]  Brimonidine Tartrate-Timolol (COMBIGAN OP) Apply to eye.   Yes [provider]  cholecalciferol (VITAMIN D) 1000 units tablet Take 1,000 Units by mouth daily.   Yes [provider]  Cyanocobalamin (VITAMIN B-12 CR PO) Take  500 mcg by mouth daily.   Yes [provider]  ezetimibe (ZETIA) 10 MG tablet Take 1 tablet (10 mg total) by mouth daily after supper. 11/13/19 11/07/20 Yes Adrian Prows, MD  fluticasone (FLONASE) 50 MCG/ACT nasal spray Place 1 spray into both nostrils daily. 09/03/17  Yes Bobbitt, [place] Muta, MD  fluticasone (FLOVENT HFA) 44 MCG/ACT inhaler Inhale 2 puffs into the lungs 2 (two) times daily. With spacer. 10/22/17  Yes Bobbitt, [place] Muta, MD  glucose blood test strip Use as instructed 01/13/20  Yes Wendie Agreste, MD  lisinopril-hydrochlorothiazide (ZESTORETIC) 20-25 MG tablet Take 1 tablet by mouth daily. 11/13/19  Yes Adrian Prows, MD  loratadine (CLARITIN) 10 MG tablet Take 1 tablet (10 mg total) by mouth daily. 05/12/18  Yes Nicholas Lose, MD  metFORMIN (GLUCOPHAGE) 500 MG tablet Take 1 tablet (500 mg total) by mouth in the morning and at bedtime. 09/14/19  Yes Wendie Agreste, MD  metoprolol succinate (TOPROL-XL) 25 MG 24 hr tablet Take 1 tablet (25 mg total) by mouth daily. 04/02/19  Yes Miquel Dunn, NP  pravastatin (PRAVACHOL) 20 MG tablet  Take 20 mg by mouth every other day.   Yes [provider]  tamoxifen (NOLVADEX) 10 MG tablet Take 1 tablet (10 mg total) by mouth daily. Patient taking differently: Take 10 mg by mouth every other day. 05/13/19  Yes Nicholas Lose, MD  Turmeric Curcumin 500 MG CAPS Take by mouth.   Yes [provider]  vitamin C (ASCORBIC ACID) 500 MG tablet Take 500 mg by mouth 2 (two) times daily.    Yes [provider]  Zinc 50 MG TABS  12/17/17  Yes [provider]  LISINOPRIL PO Take 1 tablet by mouth daily.  06/13/11  [provider]   Social History   Socioeconomic History   Marital status: Married    Spouse name: [name]   Number of children: 4   Years of education: [employer] education level: Not on file  Occupational History   Occupation: retired  Tobacco Use   Smoking status: Never  Smoker   Smokeless tobacco: Never Used  Scientific laboratory technician Use: Never used  Substance and Sexual Activity   Alcohol use: No    Alcohol/week: 0.0 standard drinks   Drug use: No   Sexual activity: Yes    Comment: menarche 54, P4, Premarin for short while, Provera x 3 years  Other Topics Concern   Not on file  Social History Narrative   Marital status: married x 37  years; happily married; no abuse.      Children: 4 children (2 sons living; 1 son died in 3522 at age 59years old, 1 daughter); six grandchildren; no gg.      Lives:  With husband.      Employed: retired in 2011; [employer].      Tobacco: none       Alcohol:  None      Drugs: none      Exercise:  Rowing 3 days per week.      Seatbelt:  100%      Sunscreen:  Face SPF 15.      Guns:  Loaded mostly secured.      ADLs:  Drives minimally; no assistant devices; independent with all ADLs.       Advanced Directives: YES; FULL CODE but no prolonged measures.      Her nocturia is improved under CPAP use at 10 cm water- AHI is now 0.8 and from 30 at baseline. CMS compliance  6 hours 40 minutes - sleeps on the side, 08-10-11 .FFM - likes it.    . Flonase used prn.     Caffeine Use: 1 cup daily   Social Determinants of Health   Financial Resource Strain: Not on file  Food Insecurity: Not on file  Transportation Needs: Not on file  Physical Activity: Not on file  Stress: Not on file  Social Connections: Not on file  Intimate Partner Violence: Not on file     Observations/Objective: Vitals:   03/02/20 1248  Temp: 99.8 F (37.7 C)  TempSrc: Tympanic  Weight: 223 lb (101.2 kg)  Height: 5\' 1"  (1.549 m)   Speaking in full sentences on the phone without respiratory distress.  Appropriate responses.  All questions were answered with understanding of plan expressed including ER precautions.  Assessment and Plan: COVID-19 virus infection Breakthrough infection with previous vaccination.  Plan for  monoclonal antibody tomorrow.  No concerning symptoms at this time over phone but symptomatic care was discussed, Tessalon Perles prescribed, ER/urgent care precautions given.  Follow Up Instructions: As needed.  ER/urgent care as above    I discussed the assessment and treatment plan with the patient. The patient was provided an opportunity to ask questions and all were answered. The patient agreed with the plan and demonstrated an understanding of the instructions.   The patient was advised to call back or seek an in-person evaluation if the symptoms worsen or if the condition fails to improve as anticipated.  I provided 18 minutes of non-face-to-face time during this encounter.  Signed,   [name], MD [facility].  03/02/20

## 2020-03-02 NOTE — Telephone Encounter (Signed)
I connected by phone with [name] on 03/02/2020 at 3:58 PM to discuss the potential use of a new treatment for mild to moderate COVID-19 viral infection in non-hospitalized patients.  This patient is a 74 y.o. female that meets the FDA criteria for Emergency Use Authorization of COVID monoclonal antibody casirivimab/imdevimab, bamlanivimab/etesevimab, or sotrovimab.  Has a (+) direct SARS-CoV-2 viral test result  Has mild or moderate COVID-19   Is NOT hospitalized due to COVID-19  Is within 10 days of symptom onset  Has at least one of the high risk factor(s) for progression to severe COVID-19 and/or hospitalization as defined in EUA.  Specific high risk criteria : Older age (>/= 75 yo), BMI > 25, Diabetes, Cardiovascular disease or hypertension and Chronic Lung Disease   Symptom onset: 12/11  Symptoms: cough, fever, fatigue, congestion, mental fogginess, decreased appetite  I have spoken and communicated the following to the patient or parent/caregiver regarding COVID monoclonal antibody treatment:  1. FDA has authorized the emergency use for the treatment of mild to moderate COVID-19 in adults and pediatric patients with positive results of direct SARS-CoV-2 viral testing who are 10 years of age and older weighing at least 40 kg, and who are at high risk for progressing to severe COVID-19 and/or hospitalization.  2. The significant known and potential risks and benefits of COVID monoclonal antibody, and the extent to which such potential risks and benefits are unknown.  3. Information on available alternative treatments and the risks and benefits of those alternatives, including clinical trials.  4. Patients treated with COVID monoclonal antibody should continue to self-isolate and use infection control measures (e.g., wear mask, isolate, social distance, avoid sharing personal items, clean and disinfect "high touch" surfaces, and frequent handwashing) according to CDC  guidelines.   5. The patient or parent/caregiver has the option to accept or refuse COVID monoclonal antibody treatment.  After reviewing this information with the patient, the patient has agreed to receive one of the available covid 19 monoclonal antibodies and will be provided an appropriate fact sheet prior to infusion.    Orma Render, NP 03/02/2020 3:58 PM

## 2020-03-02 NOTE — Patient Instructions (Addendum)
COVID-19 COVID-19 is a respiratory infection that is caused by a virus called severe acute respiratory syndrome coronavirus 2 (SARS-CoV-2). The disease is also known as coronavirus disease or novel coronavirus. In some people, the virus may not cause any symptoms. In others, it may cause a serious infection. The infection can get worse quickly and can lead to complications, such as:  Pneumonia, or infection of the lungs.  Acute respiratory distress syndrome or ARDS. This is a condition in which fluid build-up in the lungs prevents the lungs from filling with air and passing oxygen into the blood.  Acute respiratory failure. This is a condition in which there is not enough oxygen passing from the lungs to the body or when carbon dioxide is not passing from the lungs out of the body.  Sepsis or septic shock. This is a serious bodily reaction to an infection.  Blood clotting problems.  Secondary infections due to bacteria or fungus.  Organ failure. This is when your body's organs stop working. The virus that causes COVID-19 is contagious. This means that it can spread from person to person through droplets from coughs and sneezes (respiratory secretions). What are the causes? This illness is caused by a virus. You may catch the virus by:  Breathing in droplets from an infected person. Droplets can be spread by a person breathing, speaking, singing, coughing, or sneezing.  Touching something, like a table or a doorknob, that was exposed to the virus (contaminated) and then touching your mouth, nose, or eyes. What increases the risk? Risk for infection You are more likely to be infected with this virus if you:  Are within 6 feet (2 meters) of a person with COVID-19.  Provide care for or live with a person who is infected with COVID-19.  Spend time in crowded indoor spaces or live in shared housing. Risk for serious illness You are more likely to become seriously ill from the virus  if you:  Are 71 years of age or older. The higher your age, the more you are at risk for serious illness.  Live in a nursing home or long-term care facility.  Have cancer.  Have a long-term (chronic) disease such as: ? Chronic lung disease, including chronic obstructive pulmonary disease or asthma. ? A long-term disease that lowers your body's ability to fight infection (immunocompromised). ? Heart disease, including heart failure, a condition in which the arteries that lead to the heart become narrow or blocked (coronary artery disease), a disease which makes the heart muscle thick, weak, or stiff (cardiomyopathy). ? Diabetes. ? Chronic kidney disease. ? Sickle cell disease, a condition in which red blood cells have an abnormal "sickle" shape. ? Liver disease.  Are obese. What are the signs or symptoms? Symptoms of this condition can range from mild to severe. Symptoms may appear any time from 2 to 14 days after being exposed to the virus. They include:  A fever or chills.  A cough.  Difficulty breathing.  Headaches, body aches, or muscle aches.  Runny or stuffy (congested) nose.  A sore throat.  New loss of taste or smell. Some people may also have stomach problems, such as nausea, vomiting, or diarrhea. Other people may not have any symptoms of COVID-19. How is this diagnosed? This condition may be diagnosed based on:  Your signs and symptoms, especially if: ? You live in an area with a COVID-19 outbreak. ? You recently traveled to or from an area where the virus is common. ?  You provide care for or live with a person who was diagnosed with COVID-19. ? You were exposed to a person who was diagnosed with COVID-19.  A physical exam.  Lab tests, which may include: ? Taking a sample of fluid from the back of your nose and throat (nasopharyngeal fluid), your nose, or your throat using a swab. ? A sample of mucus from your lungs (sputum). ? Blood tests.  Imaging  tests, which may include, X-rays, CT scan, or ultrasound. How is this treated? At present, there is no medicine to treat COVID-19. Medicines that treat other diseases are being used on a trial basis to see if they are effective against COVID-19. Your health care provider will talk with you about ways to treat your symptoms. For most people, the infection is mild and can be managed at home with rest, fluids, and over-the-counter medicines. Treatment for a serious infection usually takes places in a hospital intensive care unit (ICU). It may include one or more of the following treatments. These treatments are given until your symptoms improve.  Receiving fluids and medicines through an IV.  Supplemental oxygen. Extra oxygen is given through a tube in the nose, a face mask, or a hood.  Positioning you to lie on your stomach (prone position). This makes it easier for oxygen to get into the lungs.  Continuous positive airway pressure (CPAP) or bi-level positive airway pressure (BPAP) machine. This treatment uses mild air pressure to keep the airways open. A tube that is connected to a motor delivers oxygen to the body.  Ventilator. This treatment moves air into and out of the lungs by using a tube that is placed in your windpipe.  Tracheostomy. This is a procedure to create a hole in the neck so that a breathing tube can be inserted.  Extracorporeal membrane oxygenation (ECMO). This procedure gives the lungs a chance to recover by taking over the functions of the heart and lungs. It supplies oxygen to the body and removes carbon dioxide. Follow these instructions at home: Lifestyle  If you are sick, stay home except to get medical care. Your health care provider will tell you how long to stay home. Call your health care provider before you go for medical care.  Rest at home as told by your health care provider.  Do not use any products that contain nicotine or tobacco, such as cigarettes,  e-cigarettes, and chewing tobacco. If you need help quitting, ask your health care provider.  Return to your normal activities as told by your health care provider. Ask your health care provider what activities are safe for you. General instructions  Take over-the-counter and prescription medicines only as told by your health care provider.  Drink enough fluid to keep your urine pale yellow.  Keep all follow-up visits as told by your health care provider. This is important. How is this prevented?  There is no vaccine to help prevent COVID-19 infection. However, there are steps you can take to protect yourself and others from this virus. To protect yourself:   Do not travel to areas where COVID-19 is a risk. The areas where COVID-19 is reported change often. To identify high-risk areas and travel restrictions, check the CDC travel website: FatFares.com.br  If you live in, or must travel to, an area where COVID-19 is a risk, take precautions to avoid infection. ? Stay away from people who are sick. ? Wash your hands often with soap and water for 20 seconds. If soap and  water are not available, use an alcohol-based hand sanitizer. ? Avoid touching your mouth, face, eyes, or nose. ? Avoid going out in public, follow guidance from your state and local health authorities. ? If you must go out in public, wear a cloth face covering or face mask. Make sure your mask covers your nose and mouth. ? Avoid crowded indoor spaces. Stay at least 6 feet (2 meters) away from others. ? Disinfect objects and surfaces that are frequently touched every day. This may include:  Counters and tables.  Doorknobs and light switches.  Sinks and faucets.  Electronics, such as phones, remote controls, keyboards, computers, and tablets. To protect others: If you have symptoms of COVID-19, take steps to prevent the virus from spreading to others.  If you think you have a COVID-19 infection, contact  your health care provider right away. Tell your health care team that you think you may have a COVID-19 infection.  Stay home. Leave your house only to seek medical care. Do not use public transport.  Do not travel while you are sick.  Wash your hands often with soap and water for 20 seconds. If soap and water are not available, use alcohol-based hand sanitizer.  Stay away from other members of your household. Let healthy household members care for children and pets, if possible. If you have to care for children or pets, wash your hands often and wear a mask. If possible, stay in your own room, separate from others. Use a different bathroom.  Make sure that all people in your household wash their hands well and often.  Cough or sneeze into a tissue or your sleeve or elbow. Do not cough or sneeze into your hand or into the air.  Wear a cloth face covering or face mask. Make sure your mask covers your nose and mouth. Where to find more information  Centers for Disease Control and Prevention: www.cdc.gov/coronavirus/2019-ncov/index.html  World Health Organization: www.who.int/health-topics/coronavirus Contact a health care provider if:  You live in or have traveled to an area where COVID-19 is a risk and you have symptoms of the infection.  You have had contact with someone who has COVID-19 and you have symptoms of the infection. Get help right away if:  You have trouble breathing.  You have pain or pressure in your chest.  You have confusion.  You have bluish lips and fingernails.  You have difficulty waking from sleep.  You have symptoms that get worse. These symptoms may represent a serious problem that is an emergency. Do not wait to see if the symptoms will go away. Get medical help right away. Call your local emergency services (911 in the [place] Do not drive yourself to the hospital. Let the emergency medical personnel know if you think you have  COVID-19. Summary  COVID-19 is a respiratory infection that is caused by a virus. It is also known as coronavirus disease or novel coronavirus. It can cause serious infections, such as pneumonia, acute respiratory distress syndrome, acute respiratory failure, or sepsis.  The virus that causes COVID-19 is contagious. This means that it can spread from person to person through droplets from breathing, speaking, singing, coughing, or sneezing.  You are more likely to develop a serious illness if you are 50 years of age or older, have a weak immune system, live in a nursing home, or have chronic disease.  There is no medicine to treat COVID-19. Your health care provider will talk with you about ways to treat your   symptoms.  Take steps to protect yourself and others from infection. Wash your hands often and disinfect objects and surfaces that are frequently touched every day. Stay away from people who are sick and wear a mask if you are sick. This information is not intended to replace advice given to you by your health care provider. Make sure you discuss any questions you have with your health care provider. Document Revised: 01/02/2019 Document Reviewed: 04/10/2018 Elsevier Patient Education  [employer].   If you have lab work done today you will be contacted with your lab results within the next 2 weeks.  If you have not heard from [place] then please contact us. The fastest way to get your results is to register for My Chart.   IF you received an x-ray today, you will receive an invoice from [facility]. Please contact [facility] at [phone] with questions or concerns regarding your invoice.   IF you received labwork today, you will receive an invoice from [facility]. Please contact [facility] at [phone] with questions or concerns regarding your invoice.   Our billing staff will not be able to assist you with questions regarding bills from these companies.  You  will be contacted with the lab results as soon as they are available. The fastest way to get your results is to activate your My Chart account. Instructions are located on the last page of this paperwork. If you have not heard from [place] regarding the results in 2 weeks, please contact this office.   I am sorry to hear that you were infected with COVID-19, but anticipate your previous vaccination and the monoclonal antibody infusion tomorrow should help prevent severe illness.  Tessalon Perles as needed for cough, or Mucinex DM as we discussed.  Continue to drink plenty of fluids, rest.  If any acute worsening symptoms including shortness of breath at rest, confusion or disorientation, or are unable to drink fluids, I do recommend evaluation at the urgent care or emergency room.  Please let me know if there are questions and I hope you feel better.    10 Things You Can Do to Manage Your COVID-19 Symptoms at Home If you have possible or confirmed COVID-19: 1. Stay home from work and school. And stay away from other public places. If you must go out, avoid using any kind of public transportation, ridesharing, or taxis. 2. Monitor your symptoms carefully. If your symptoms get worse, call your healthcare provider immediately. 3. Get rest and stay hydrated. 4. If you have a medical appointment, call the healthcare provider ahead of time and tell them that you have or may have COVID-19. 5. For medical emergencies, call 911 and notify the dispatch personnel that you have or may have COVID-19. 6. Cover your cough and sneezes with a tissue or use the inside of your elbow. 7. Wash your hands often with soap and water for at least 20 seconds or clean your hands with an alcohol-based hand sanitizer that contains at least 60% alcohol. 8. As much as possible, stay in a specific room and away from other people in your home. Also, you should use a separate bathroom, if available. If you need to be around other people in  or outside of the home, wear a mask. 9. Avoid sharing personal items with other people in your household, like dishes, towels, and bedding. 10. Clean all surfaces that are touched often, like counters, tabletops, and doorknobs. Use household cleaning sprays or wipes according to the  label instructions. [email or web address] 09/17/2018 This information is not intended to replace advice given to you by your health care provider. Make sure you discuss any questions you have with your health care provider. Document Revised: 02/19/2019 Document Reviewed: 02/19/2019 Elsevier Patient Education  [employer].

## 2020-03-03 ENCOUNTER — Ambulatory Visit (HOSPITAL_COMMUNITY)
Admission: RE | Admit: 2020-03-03 | Discharge: 2020-03-03 | Disposition: A | Discharge status: Home / Self Care | Payer: Medicare Other | Source: Ambulatory Visit | Attending: Pulmonary Disease | Admitting: Pulmonary Disease

## 2020-03-03 DIAGNOSIS — Z170 Estrogen receptor positive status [ER+]: Secondary | ICD-10-CM | POA: Diagnosis not present

## 2020-03-03 DIAGNOSIS — G4733 Obstructive sleep apnea (adult) (pediatric): Secondary | ICD-10-CM | POA: Diagnosis not present

## 2020-03-03 DIAGNOSIS — U071 COVID-19: Secondary | ICD-10-CM | POA: Insufficient documentation

## 2020-03-03 DIAGNOSIS — Z6841 Body Mass Index (BMI) 40.0 and over, adult: Secondary | ICD-10-CM | POA: Insufficient documentation

## 2020-03-03 DIAGNOSIS — Z23 Encounter for immunization: Secondary | ICD-10-CM | POA: Insufficient documentation

## 2020-03-03 DIAGNOSIS — C50112 Malignant neoplasm of central portion of left female breast: Secondary | ICD-10-CM | POA: Insufficient documentation

## 2020-03-03 DIAGNOSIS — J4530 Mild persistent asthma, uncomplicated: Secondary | ICD-10-CM | POA: Insufficient documentation

## 2020-03-03 DIAGNOSIS — Z9989 Dependence on other enabling machines and devices: Secondary | ICD-10-CM | POA: Diagnosis not present

## 2020-03-03 DIAGNOSIS — I10 Essential (primary) hypertension: Secondary | ICD-10-CM | POA: Insufficient documentation

## 2020-03-03 DIAGNOSIS — E7800 Pure hypercholesterolemia, unspecified: Secondary | ICD-10-CM | POA: Diagnosis not present

## 2020-03-03 DIAGNOSIS — E1165 Type 2 diabetes mellitus with hyperglycemia: Secondary | ICD-10-CM | POA: Insufficient documentation

## 2020-03-03 MED ORDER — METHYLPREDNISOLONE SODIUM SUCC 125 MG IJ SOLR: 125.0000 mg | Freq: Once | INTRAMUSCULAR | Status: DC | PRN

## 2020-03-03 MED ORDER — SODIUM CHLORIDE 0.9 % IV SOLN: Freq: Once | INTRAVENOUS | Status: AC

## 2020-03-03 MED ORDER — DIPHENHYDRAMINE HCL 50 MG/ML IJ SOLN: 50.0000 mg | Freq: Once | INTRAMUSCULAR | Status: DC | PRN

## 2020-03-03 MED ORDER — ALBUTEROL SULFATE HFA 108 (90 BASE) MCG/ACT IN AERS: 2.0000 | INHALATION_SPRAY | Freq: Once | RESPIRATORY_TRACT | Status: DC | PRN

## 2020-03-03 MED ORDER — FAMOTIDINE IN NACL 20-0.9 MG/50ML-% IV SOLN: 20.0000 mg | Freq: Once | INTRAVENOUS | Status: DC | PRN

## 2020-03-03 MED ORDER — SODIUM CHLORIDE 0.9 % IV SOLN: INTRAVENOUS | Status: DC | PRN

## 2020-03-03 MED ORDER — EPINEPHRINE 0.3 MG/0.3ML IJ SOAJ: 0.3000 mg | Freq: Once | INTRAMUSCULAR | Status: DC | PRN

## 2020-03-03 NOTE — Progress Notes (Signed)
Patient reviewed Fact Sheet for Patients, Parents, and Caregivers for Emergency Use Authorization (EUA) of bamlanivimab and etesevimab for the Treatment of Coronavirus. Patient also reviewed and is agreeable to the estimated cost of treatment. Patient is agreeable to proceed.   

## 2020-03-03 NOTE — Progress Notes (Signed)
  Diagnosis: COVID-19  Physician: Dr. Wright   Procedure: [facility] Med: bamlanivimab\etesevimab infusion - Provided patient with bamlanimivab\etesevimab fact sheet for patients, parents and caregivers prior to infusion.  Complications: No immediate complications noted.  Discharge: Discharged home   [name] 03/03/2020   

## 2020-03-03 NOTE — Discharge Instructions (Signed)
10 Things You Can Do to Manage Your COVID-19 Symptoms at Home If you have possible or confirmed COVID-19: 1. Stay home from work and school. And stay away from other public places. If you must go out, avoid using any kind of public transportation, ridesharing, or taxis. 2. Monitor your symptoms carefully. If your symptoms get worse, call your healthcare provider immediately. 3. Get rest and stay hydrated. 4. If you have a medical appointment, call the healthcare provider ahead of time and tell them that you have or may have COVID-19. 5. For medical emergencies, call 911 and notify the dispatch personnel that you have or may have COVID-19. 6. Cover your cough and sneezes with a tissue or use the inside of your elbow. 7. Wash your hands often with soap and water for at least 20 seconds or clean your hands with an alcohol-based hand sanitizer that contains at least 60% alcohol. 8. As much as possible, stay in a specific room and away from other people in your home. Also, you should use a separate bathroom, if available. If you need to be around other people in or outside of the home, wear a mask. 9. Avoid sharing personal items with other people in your household, like dishes, towels, and bedding. 10. Clean all surfaces that are touched often, like counters, tabletops, and doorknobs. Use household cleaning sprays or wipes according to the label instructions. cdc.gov/coronavirus 09/17/2018 This information is not intended to replace advice given to you by your health care provider. Make sure you discuss any questions you have with your health care provider. Document Revised: 02/19/2019 Document Reviewed: 02/19/2019 Elsevier Patient Education  2020 [employer] Inc. What types of side effects do monoclonal antibody drugs cause?  Common side effects  In general, the more common side effects caused by monoclonal antibody drugs include: . Allergic reactions, such as hives or itching . Flu-like signs and  symptoms, including chills, fatigue, fever, and muscle aches and pains . Nausea, vomiting . Diarrhea . Skin rashes . Low blood pressure   The CDC is recommending patients who receive monoclonal antibody treatments wait at least 90 days before being vaccinated.  Currently, there are no data on the safety and efficacy of mRNA COVID-19 vaccines in persons who received monoclonal antibodies or convalescent plasma as part of COVID-19 treatment. Based on the estimated half-life of such therapies as well as evidence suggesting that reinfection is uncommon in the 90 days after initial infection, vaccination should be deferred for at least 90 days, as a precautionary measure until additional information becomes available, to avoid interference of the antibody treatment with vaccine-induced immune responses. If you have any questions or concerns after the infusion please call the Advanced Practice Provider on call at [phone]. This number is ONLY intended for your use regarding questions or concerns about the infusion post-treatment side-effects.  Please do not provide this number to others for use. For return to work notes please contact your primary care provider.   If someone you know is interested in receiving treatment please have them call the COVID hotline at [phone].   

## 2020-03-19 HISTORY — PX: EYE SURGERY: SHX253

## 2020-04-07 ENCOUNTER — Encounter (INDEPENDENT_AMBULATORY_CARE_PROVIDER_SITE_OTHER): Payer: Self-pay | Admitting: Ophthalmology

## 2020-04-07 ENCOUNTER — Ambulatory Visit (INDEPENDENT_AMBULATORY_CARE_PROVIDER_SITE_OTHER): Payer: Medicare Other | Admitting: Ophthalmology

## 2020-04-07 ENCOUNTER — Other Ambulatory Visit: Payer: Self-pay

## 2020-04-07 DIAGNOSIS — Z9989 Dependence on other enabling machines and devices: Secondary | ICD-10-CM | POA: Diagnosis not present

## 2020-04-07 DIAGNOSIS — H353221 Exudative age-related macular degeneration, left eye, with active choroidal neovascularization: Secondary | ICD-10-CM

## 2020-04-07 DIAGNOSIS — G4733 Obstructive sleep apnea (adult) (pediatric): Secondary | ICD-10-CM | POA: Diagnosis not present

## 2020-04-07 DIAGNOSIS — H2513 Age-related nuclear cataract, bilateral: Secondary | ICD-10-CM

## 2020-04-07 MED ORDER — BEVACIZUMAB 2.5 MG/0.1ML IZ SOSY
2.5000 mg | PREFILLED_SYRINGE | INTRAVITREAL | Status: AC | PRN
  Administered 2020-04-07: 2.5 mg via INTRAVITREAL

## 2020-04-07 NOTE — Progress Notes (Signed)
04/07/2020     CHIEF COMPLAINT Patient presents for Retina Follow Up (6 Wk FU OU, POSS AVASTIN OS///Pt reports stable vision OU, pt denies any new F/F, pain or pressure. ////Last A1C: around 7 taken 12/2019///Last BS: 137 this AM )   HISTORY OF PRESENT ILLNESS: [name] is a 75 y.o. female who presents to the clinic today for:   HPI    Retina Follow Up    Patient presents with  Wet AMD.  In left eye.  This started 6 weeks ago.  Duration of 6 weeks.  Since onset it is stable. Additional comments: 6 Wk FU OU, POSS AVASTIN OS   Pt reports stable vision OU, pt denies any new F/F, pain or pressure.     Last A1C: around 7 taken 12/2019   Last BS: 137 this AM        Last edited by Varney Biles D on 04/07/2020  9:00 AM. (History)      Referring physician: Shade Flood, MD [address] [place],  [place] [address]  HISTORICAL INFORMATION:   Selected notes from the MEDICAL RECORD NUMBER    Lab Results  Component Value Date   HGBA1C 7.5 (H) 01/13/2020     CURRENT MEDICATIONS: Current Outpatient Medications (Ophthalmic Drugs)  Medication Sig  . Brimonidine Tartrate-Timolol (COMBIGAN OP) Apply 1 drop to eye 2 (two) times daily. In both eyes.   No current facility-administered medications for this visit. (Ophthalmic Drugs)   Current Outpatient Medications (Other)  Medication Sig  . aspirin 81 MG tablet Take 81 mg by mouth daily.  . benzonatate (TESSALON) 100 MG capsule Take 1-2 capsules (100-200 mg total) by mouth 3 (three) times daily as needed for cough.  . cholecalciferol (VITAMIN D) 1000 units tablet Take 1,000 Units by mouth daily.  . Cyanocobalamin (VITAMIN B-12 CR PO) Take 500 mcg by mouth daily.  Marland Kitchen ezetimibe (ZETIA) 10 MG tablet Take 1 tablet (10 mg total) by mouth daily after supper.  . fluticasone (FLONASE) 50 MCG/ACT nasal spray Place 1 spray into both nostrils daily.  . fluticasone (FLOVENT HFA) 44 MCG/ACT inhaler Inhale 2 puffs into the lungs  2 (two) times daily. With spacer.  Marland Kitchen glucose blood test strip Use as instructed  . lisinopril-hydrochlorothiazide (ZESTORETIC) 20-25 MG tablet Take 1 tablet by mouth daily.  Marland Kitchen loratadine (CLARITIN) 10 MG tablet Take 1 tablet (10 mg total) by mouth daily.  . metFORMIN (GLUCOPHAGE) 500 MG tablet Take 1 tablet (500 mg total) by mouth in the morning and at bedtime.  . metoprolol succinate (TOPROL-XL) 25 MG 24 hr tablet Take 1 tablet (25 mg total) by mouth daily.  . pravastatin (PRAVACHOL) 20 MG tablet Take 20 mg by mouth every other day.  . tamoxifen (NOLVADEX) 10 MG tablet Take 1 tablet (10 mg total) by mouth daily. (Patient taking differently: Take 10 mg by mouth every other day.)  . Turmeric Curcumin 500 MG CAPS Take by mouth.  . vitamin C (ASCORBIC ACID) 500 MG tablet Take 500 mg by mouth 2 (two) times daily.   . Zinc 50 MG TABS    Current Facility-Administered Medications (Other)  Medication Route  . EPINEPHrine (Anaphylaxis) SOLN 0.3 mg Intramuscular      REVIEW OF SYSTEMS:    ALLERGIES Allergies  Allergen Reactions  . Peanut (Diagnostic) Cough, Hypertension and Shortness Of Breath  . Adhesive [Tape] Rash  . Crestor [Rosuvastatin] Other (See Comments)    Severe arthralgia and myalgia  . Latex Itching  and Other (See Comments)    Causes blisters  . Lovastatin Other (See Comments)    Arthralgias, hyperglycemia with lovastatin.    PAST MEDICAL HISTORY Past Medical History:  Diagnosis Date  . Allergic rhinitis 03/23/2013  . Allergy   . Anemia    due to vaginal bleeding  . Arthritis    hands  . Asthmatic bronchitis 09/03/2017  . Breast cancer ([place]) 08/2012   left  . Dental crowns present   . Eczema   . Fibroids   . Headache(784.0)    tension  . History of endometriosis   . Hyperlipidemia   . Hypertension    under control with med., has been on med. x 3 yr.  . OSA (obstructive sleep apnea)   . Postmenopausal bleeding   . Recurrent upper respiratory infection (URI)    . S/P radiation therapy 10/20/2012-12/05/2012   left breast 50.4 gray, lumpectomy cavity boosted to 62.4 gray  . Seasonal allergies   . Sleep apnea    uses CPAP nightly  . Urinary urgency   . White coat hypertension    Past Surgical History:  Procedure Laterality Date  . BACK SURGERY  1997   lumbar  . BREAST LUMPECTOMY WITH NEEDLE LOCALIZATION AND AXILLARY SENTINEL LYMPH NODE BX Left 09/03/2012   Procedure: BREAST LUMPECTOMY WITH NEEDLE LOCALIZATION AND AXILLARY SENTINEL LYMPH NODE BX;  Surgeon: Edward Jolly, MD;  Location: [place];  Service: General;  Laterality: Left;  . CHOLECYSTECTOMY  1995  . COLONOSCOPY W/ POLYPECTOMY  03/20/2007  . DILATION AND CURETTAGE OF UTERUS  2005  . DILATION AND CURETTAGE OF UTERUS  06/22/2011   Procedure: DILATATION AND CURETTAGE;  Surgeon: Alvino Chapel, MD;  Location: [facility];  Service: Gynecology;  Laterality: N/A;  OK PER KEELA FOR 7:15 START  . DORSAL COMPARTMENT RELEASE Right 05/19/2013   Procedure: RELEASE 1ST  DORSAL COMPARTMENT RIGHT (DEQUERVAIN);  Surgeon: Cammie Sickle., MD;  Location: [facility];  Service: Orthopedics;  Laterality: Right;  . EXPLORATORY LAPAROTOMY  06-30-2007   ATTEMPTED HYSTERECTOMY ABORTED DUE TO EXTENSIVE ENDOMETRIOSIS W/ DENSE PELVIC ADHESIONS INVOLVING UTERUS AND LOWER RECTOSIGMOID  . HYSTEROSCOPY WITH D & C  05/06/2009    FAMILY HISTORY Family History  Problem Relation Age of Onset  . Vision loss Mother   . Hypertension Mother   . Anesthesia problems Mother        post-op N/V  . Heart disease Mother        carotid stenosis s/p CAE  . Osteoporosis Mother   . Eczema Mother   . Depression Father   . Heart disease Sister 18       arrhythmia  . Hypertension Sister   . Hyperlipidemia Sister   . Diabetes Brother   . Hypertension Brother   . Cancer Brother        skin  . Hyperlipidemia Brother   . Stroke Brother   . COPD Daughter   . Atopy  Daughter   . Cancer Cousin   . Kidney failure Maternal Grandmother   . Heart disease Maternal Grandfather   . Hypertension Other   . Colon cancer Neg Hx   . Allergic rhinitis Neg Hx   . Asthma Neg Hx   . Urticaria Neg Hx     SOCIAL HISTORY Social History   Tobacco Use  . Smoking status: Never Smoker  . Smokeless tobacco: Never Used  Vaping Use  . Vaping Use: Never used  Substance Use Topics  . Alcohol use: No    Alcohol/week: 0.0 standard drinks  . Drug use: No         OPHTHALMIC EXAM: Base Eye Exam    Visual Acuity (ETDRS)      Right Left   Dist cc 20/25 -2 20/30 +1   Dist ph cc  NI   Correction: Glasses       Tonometry (Tonopen, 9:07 AM)      Right Left   Pressure 14 15       Pupils      Pupils Dark Light Shape React APD   Right PERRL 4 3 Round Slow None   Left PERRL 4 3 Round Slow None       Visual Fields (Counting fingers)      Left Right    Full Full       Extraocular Movement      Right Left    Full Full       Neuro/Psych    Oriented x3: Yes   Mood/Affect: Normal       Dilation    Left eye: 1.0% Mydriacyl, 2.5% Phenylephrine @ 9:07 AM        Slit Lamp and Fundus Exam    External Exam      Right Left   External Normal Normal       Slit Lamp Exam      Right Left   Lids/Lashes Normal Normal   Conjunctiva/Sclera White and quiet White and quiet   Cornea Clear Clear   Anterior Chamber Deep and quiet Deep and quiet   Iris Round and reactive Round and reactive   Lens 2+ Nuclear sclerosis 2+ Nuclear sclerosis   Anterior Vitreous Normal Normal       Fundus Exam      Right Left   Posterior Vitreous  Posterior vitreous detachment   Disc  Peripapillary atrophy   C/D Ratio  0.5   Macula  Retinal pigment epithelial mottling, no exudates, no hemorrhage,  macular thickening, Early age related macular degeneration, Pigmented atrophy, nonturbid subretinal fluid   Vessels  Normal   Periphery  Normal          IMAGING AND PROCEDURES   Imaging and Procedures for 04/07/20  OCT, Retina - OU - Both Eyes       Right Eye Quality was good. Scan locations included subfoveal. Central Foveal Thickness: 235. Progression has been stable. Findings include normal foveal contour.   Left Eye Quality was good. Scan locations included subfoveal. Central Foveal Thickness: 249. Findings include subretinal fluid.   Notes Persistent serous subretinal fluid subfoveal left eye, currently at 6-week follow-up.  We will repeat injection today and examination in 5 weeks.         Intravitreal Injection, Pharmacologic Agent - OS - Left Eye       Time Out 04/07/2020. 10:13 AM. Confirmed correct patient, procedure, site, and patient consented.   Anesthesia Topical anesthesia was used. Anesthetic medications included Akten 3.5%.   Procedure Preparation included Tobramycin 0.3%, 10% betadine to eyelids, 5% betadine to ocular surface. A 30 gauge needle was used.   Injection:  2.5 mg Bevacizumab (AVASTIN) 2.5mg /0.63mL SOSY   NDC: 99833-825-05, Lot: [id number]   Route: Intravitreal, Site: Left Eye  Post-op Post injection exam found visual acuity of at least counting fingers. The patient tolerated the procedure well. There were no complications. The patient received written and verbal post procedure care education.  ASSESSMENT/PLAN:  Nuclear sclerotic cataract of both eyes Okay to proceed with cataract extraction with intraocular displacement in each eye to maximize visual potential.  Will continue to treat the left eye for wet ARMD with serous retinal detachment  Exudative age-related macular degeneration of left eye with active choroidal neovascularization (HCC) Resistant subretinal fluid at 6-week follow-up.  Repeat injection Avastin today and follow-up in left eye in 5 weeks      ICD-10-CM   1. Exudative age-related macular degeneration of left eye with active choroidal neovascularization (HCC)  H35.3221  OCT, Retina - OU - Both Eyes    Intravitreal Injection, Pharmacologic Agent - OS - Left Eye    bevacizumab (AVASTIN) SOSY 2.5 mg  2. Nuclear sclerotic cataract of both eyes  H25.13   3. OSA on CPAP  G47.33    Z99.89     1.  Repeat injection intravitreal Avastin today, will shorten interval examination to 5 weeks next  2.  3.  Ophthalmic Meds Ordered this visit:  Meds ordered this encounter  Medications  . bevacizumab (AVASTIN) SOSY 2.5 mg       Return in about 5 weeks (around 05/12/2020) for dilate, OS, AVASTIN OCT.  There are no Patient Instructions on file for this visit.   Explained the diagnoses, plan, and follow up with the patient and they expressed understanding.  Patient expressed understanding of the importance of proper follow up care.   Clent Demark Artha Stavros M.D. Diseases & Surgery of the Retina and Vitreous [facility] [place] 04/07/20     Abbreviations: M myopia (nearsighted); A astigmatism; H hyperopia (farsighted); P presbyopia; Mrx spectacle prescription;  CTL contact lenses; OD right eye; OS left eye; OU both eyes  XT exotropia; ET esotropia; PEK punctate epithelial keratitis; PEE punctate epithelial erosions; DES dry eye syndrome; MGD meibomian gland dysfunction; ATs artificial tears; PFAT's preservative free artificial tears; [place] nuclear sclerotic cataract; PSC posterior subcapsular cataract; ERM epi-retinal membrane; PVD posterior vitreous detachment; RD retinal detachment; DM diabetes mellitus; DR diabetic retinopathy; NPDR non-proliferative diabetic retinopathy; PDR proliferative diabetic retinopathy; CSME clinically significant macular edema; DME diabetic macular edema; dbh dot blot hemorrhages; CWS cotton wool spot; POAG primary open angle glaucoma; C/D cup-to-disc ratio; HVF humphrey visual field; GVF goldmann visual field; OCT optical coherence tomography; IOP intraocular pressure; BRVO Branch retinal vein occlusion; CRVO central retinal vein  occlusion; CRAO central retinal artery occlusion; BRAO branch retinal artery occlusion; RT retinal tear; SB scleral buckle; PPV pars plana vitrectomy; VH Vitreous hemorrhage; PRP panretinal laser photocoagulation; IVK intravitreal kenalog; VMT vitreomacular traction; MH Macular hole;  NVD neovascularization of the disc; NVE neovascularization elsewhere; AREDS age related eye disease study; ARMD age related macular degeneration; POAG primary open angle glaucoma; EBMD epithelial/anterior basement membrane dystrophy; ACIOL anterior chamber intraocular lens; IOL intraocular lens; PCIOL posterior chamber intraocular lens; Phaco/IOL phacoemulsification with intraocular lens placement; [place] photorefractive keratectomy; LASIK laser assisted in situ keratomileusis; HTN hypertension; DM diabetes mellitus; COPD chronic obstructive pulmonary disease

## 2020-04-07 NOTE — Assessment & Plan Note (Signed)
Resistant subretinal fluid at 6-week follow-up.  Repeat injection Avastin today and follow-up in left eye in 5 weeks

## 2020-04-07 NOTE — Assessment & Plan Note (Signed)
Okay to proceed with cataract extraction with intraocular displacement in each eye to maximize visual potential.  Will continue to treat the left eye for wet ARMD with serous retinal detachment

## 2020-04-14 ENCOUNTER — Ambulatory Visit (INDEPENDENT_AMBULATORY_CARE_PROVIDER_SITE_OTHER): Payer: Medicare Other | Admitting: Family Medicine

## 2020-04-14 ENCOUNTER — Other Ambulatory Visit: Payer: Self-pay

## 2020-04-14 ENCOUNTER — Ambulatory Visit (INDEPENDENT_AMBULATORY_CARE_PROVIDER_SITE_OTHER): Payer: Medicare Other

## 2020-04-14 ENCOUNTER — Encounter: Payer: Self-pay | Admitting: Family Medicine

## 2020-04-14 VITALS — BP 129/75 | HR 96 | Temp 98.5°F | Ht 61.0 in | Wt 212.0 lb

## 2020-04-14 DIAGNOSIS — E119 Type 2 diabetes mellitus without complications: Secondary | ICD-10-CM

## 2020-04-14 DIAGNOSIS — M533 Sacrococcygeal disorders, not elsewhere classified: Secondary | ICD-10-CM

## 2020-04-14 DIAGNOSIS — I10 Essential (primary) hypertension: Secondary | ICD-10-CM | POA: Diagnosis not present

## 2020-04-14 DIAGNOSIS — R000 Tachycardia, unspecified: Secondary | ICD-10-CM

## 2020-04-14 DIAGNOSIS — M5136 Other intervertebral disc degeneration, lumbar region: Secondary | ICD-10-CM | POA: Diagnosis not present

## 2020-04-14 DIAGNOSIS — E1165 Type 2 diabetes mellitus with hyperglycemia: Secondary | ICD-10-CM

## 2020-04-14 DIAGNOSIS — E7800 Pure hypercholesterolemia, unspecified: Secondary | ICD-10-CM

## 2020-04-14 DIAGNOSIS — Z1329 Encounter for screening for other suspected endocrine disorder: Secondary | ICD-10-CM

## 2020-04-14 MED ORDER — METFORMIN HCL 500 MG PO TABS: 500.0000 mg | ORAL_TABLET | Freq: Every day | ORAL | 1 refills | Status: AC

## 2020-04-14 NOTE — Progress Notes (Signed)
Subjective:  Patient ID: [name], female    DOB: 03-07-1946  Age: 75 y.o. MRN: [id number]  CC:  Chief Complaint  Patient presents with  . Follow-up    On hypertension,hyperlipidemia,and diabetes. Pt reports no issues with these conditions since last OV. Pt hasn't had any physical symptoms from the hypertension. Pt reports doing her best to avoid sugary foods.    HPI [name] presents for   Diabetes: With hyperglycemia, slight improvement in A1c in October. Home readings - 100-140. 132 yesterday.  Off meds during covid infection - few days, then 1 per day. Still on 1 per day.  option of higher dose Metformin discussed last year but she preferred to stay on current dose.  She is on ACE inhibitor, statin, and Zetia.  Microalbumin: normal ratio in 1/21.  Optho, foot exam, pneumovax: up to date.   Lab Results  Component Value Date   HGBA1C 7.0 (H) 04/14/2020   HGBA1C 7.5 (H) 01/13/2020   HGBA1C 7.9 (H) 07/22/2019   Lab Results  Component Value Date   MICROALBUR 0.2 12/06/2015   LDLCALC 157 (H) 04/14/2020   CREATININE 0.78 04/14/2020   COVID-19 infection Received vaccine in March.  Infection last month.  Unfortunately her husband also had infection and passed away in 03/19/23.  Has been doing ok with prayer, support with family and church.  Breathing better. Had monoclonal AB - plan on booster in April.   Hypertension: Lisinopril HCTZ 20/25 mg daily, Toprol-XL 25 mg daily Home readings: low readings at times during past month - 120/54 this am. As low as 80/40 during covid infection. Not low recently.  Pulse has run high since covid - 80-90's.  No palpitations/chest pains typically.   BP Readings from Last 3 Encounters:  04/14/20 129/75  03/03/20 (!) 151/88  02/04/20 140/81   Lab Results  Component Value Date   CREATININE 0.78 04/14/2020   Hyperlipidemia: Zetia 10 mg daily, pravastatin 20 mg daily prior. Stopped pravastatin in March 19, 2023 with covid.  Did not restart as shoulder pain improved off pravastatin. Bursitis still issue at times on left. R better.   Lab Results  Component Value Date   CHOL 228 (H) 04/14/2020   HDL 42 04/14/2020   LDLCALC 157 (H) 04/14/2020   TRIG 157 (H) 04/14/2020   CHOLHDL 5.4 (H) 04/14/2020   Lab Results  Component Value Date   ALT 16 04/14/2020   AST 26 04/14/2020   ALKPHOS 93 04/14/2020   BILITOT 0.6 04/14/2020   Needs disability placard. Trouble walking 200 feet due to pain in back/upper hips, arthritis. Has cane if needed to walk.   Tailbone pain Fell in 2013 - thought to have coccyx injury, always some pain, more sore in evening past 2-3 years, more sore past few weeks. No recent new injury.  Sore in tailbone area to sit.   History Patient Active Problem List   Diagnosis Date Noted  . Nuclear sclerotic cataract of both eyes 12/02/2019  . Class 3 severe obesity due to excess calories with serious comorbidity and body mass index (BMI) of 40.0 to 44.9 in adult [facility]) 07/09/2019  . OSA on CPAP 07/09/2019  . Glaucoma of right eye 07/09/2019  . Exudative age-related macular degeneration of left eye with active choroidal neovascularization ([place]) 07/06/2019  . Serous detachment of retinal pigment epithelium of left eye 07/06/2019  . Intermediate stage nonexudative age-related macular degeneration of both eyes 07/06/2019  . Degenerative retinal drusen of left eye 07/06/2019  .  Macular degeneration 03/25/2019  . Glaucoma 03/25/2019  . History of food allergy 10/22/2017  . Anaphylaxis 10/22/2017  . Food allergy 09/03/2017  . Mild persistent asthma 09/03/2017  . Spinal stenosis of lumbar region with neurogenic claudication 08/01/2016  . Pure hypercholesterolemia 08/01/2016  . Osteopenia 10/18/2014  . Obesity (BMI 30-39.9) 06/22/2014  . Perennial allergic rhinitis 03/23/2013  . Cancer of central portion of left female breast ([place]) 08/25/2012  . Obstructive sleep apnea on CPAP 04/21/2012  .  Diabetes ([place]) 12/28/2011  . HTN (hypertension) 12/28/2011  . Uterine bleeding 04/30/2011   Past Medical History:  Diagnosis Date  . Allergic rhinitis 03/23/2013  . Allergy   . Anemia    due to vaginal bleeding  . Arthritis    hands  . Asthmatic bronchitis 09/03/2017  . Breast cancer ([place]) 08/2012   left  . Dental crowns present   . Eczema   . Fibroids   . Headache(784.0)    tension  . History of endometriosis   . Hyperlipidemia   . Hypertension    under control with med., has been on med. x 3 yr.  . OSA (obstructive sleep apnea)   . Postmenopausal bleeding   . Recurrent upper respiratory infection (URI)   . S/P radiation therapy 10/20/2012-12/05/2012   left breast 50.4 gray, lumpectomy cavity boosted to 62.4 gray  . Seasonal allergies   . Sleep apnea    uses CPAP nightly  . Urinary urgency   . White coat hypertension    Past Surgical History:  Procedure Laterality Date  . BACK SURGERY  1997   lumbar  . BREAST LUMPECTOMY WITH NEEDLE LOCALIZATION AND AXILLARY SENTINEL LYMPH NODE BX Left 09/03/2012   Procedure: BREAST LUMPECTOMY WITH NEEDLE LOCALIZATION AND AXILLARY SENTINEL LYMPH NODE BX;  Surgeon: Edward Jolly, MD;  Location: [facility];  Service: General;  Laterality: Left;  . CHOLECYSTECTOMY  1995  . COLONOSCOPY W/ POLYPECTOMY  03/20/2007  . DILATION AND CURETTAGE OF UTERUS  2005  . DILATION AND CURETTAGE OF UTERUS  06/22/2011   Procedure: DILATATION AND CURETTAGE;  Surgeon: Alvino Chapel, MD;  Location: [facility];  Service: Gynecology;  Laterality: N/A;  OK PER KEELA FOR 7:15 START  . DORSAL COMPARTMENT RELEASE Right 05/19/2013   Procedure: RELEASE 1ST  DORSAL COMPARTMENT RIGHT (DEQUERVAIN);  Surgeon: Cammie Sickle., MD;  Location: [facility];  Service: Orthopedics;  Laterality: Right;  . EXPLORATORY LAPAROTOMY  06-30-2007   ATTEMPTED HYSTERECTOMY ABORTED DUE TO EXTENSIVE ENDOMETRIOSIS W/ DENSE PELVIC ADHESIONS  INVOLVING UTERUS AND LOWER RECTOSIGMOID  . HYSTEROSCOPY WITH D & C  05/06/2009   Allergies  Allergen Reactions  . Peanut (Diagnostic) Cough, Hypertension and Shortness Of Breath  . Adhesive [Tape] Rash  . Crestor [Rosuvastatin] Other (See Comments)    Severe arthralgia and myalgia  . Latex Itching and Other (See Comments)    Causes blisters  . Lovastatin Other (See Comments)    Arthralgias, hyperglycemia with lovastatin.   Prior to Admission medications   Medication Sig Start Date End Date Taking? Authorizing Provider  aspirin 81 MG tablet Take 81 mg by mouth daily.    [provider]  benzonatate (TESSALON) 100 MG capsule Take 1-2 capsules (100-200 mg total) by mouth 3 (three) times daily as needed for cough. 03/02/20   Wendie Agreste, MD  Brimonidine Tartrate-Timolol (COMBIGAN OP) Apply 1 drop to eye 2 (two) times daily. In both eyes.    [provider]  cholecalciferol (VITAMIN D) 1000 units tablet Take 1,000 Units by mouth daily.    [provider]  Cyanocobalamin (VITAMIN B-12 CR PO) Take 500 mcg by mouth daily.    [provider]  ezetimibe (ZETIA) 10 MG tablet Take 1 tablet (10 mg total) by mouth daily after supper. 11/13/19 11/07/20  Adrian Prows, MD  fluticasone (FLONASE) 50 MCG/ACT nasal spray Place 1 spray into both nostrils daily. 09/03/17   Bobbitt, Sedalia Muta, MD  fluticasone (FLOVENT HFA) 44 MCG/ACT inhaler Inhale 2 puffs into the lungs 2 (two) times daily. With spacer. 10/22/17   Bobbitt, Sedalia Muta, MD  glucose blood test strip Use as instructed 01/13/20   Wendie Agreste, MD  lisinopril-hydrochlorothiazide (ZESTORETIC) 20-25 MG tablet Take 1 tablet by mouth daily. 11/13/19   Adrian Prows, MD  loratadine (CLARITIN) 10 MG tablet Take 1 tablet (10 mg total) by mouth daily. 05/12/18   Nicholas Lose, MD  metFORMIN (GLUCOPHAGE) 500 MG tablet Take 1 tablet (500 mg total) by mouth in the morning and at bedtime. Patient taking differently: Take  500 mg by mouth 1 day or 1 dose. 09/14/19   Wendie Agreste, MD  metoprolol succinate (TOPROL-XL) 25 MG 24 hr tablet Take 1 tablet (25 mg total) by mouth daily. 04/02/19   Miquel Dunn, NP  pravastatin (PRAVACHOL) 20 MG tablet Take 20 mg by mouth every other day.    [provider]  tamoxifen (NOLVADEX) 10 MG tablet Take 1 tablet (10 mg total) by mouth daily. Patient taking differently: Take 10 mg by mouth every other day. 05/13/19   Nicholas Lose, MD  Turmeric Curcumin 500 MG CAPS Take by mouth.    [provider]  vitamin C (ASCORBIC ACID) 500 MG tablet Take 500 mg by mouth 2 (two) times daily.     [provider]  Zinc 50 MG TABS  12/17/17   [provider]  LISINOPRIL PO Take 1 tablet by mouth daily.  06/13/11  [provider]   Social History   Socioeconomic History  . Marital status: Married    Spouse name: [name]  . Number of children: 4  . Years of education: College  . Highest education level: Not on file  Occupational History  . Occupation: retired  Tobacco Use  . Smoking status: Never Smoker  . Smokeless tobacco: Never Used  Vaping Use  . Vaping Use: Never used  Substance and Sexual Activity  . Alcohol use: No    Alcohol/week: 0.0 standard drinks  . Drug use: No  . Sexual activity: Yes    Comment: menarche 20, P4, Premarin for short while, Provera x 3 years  Other Topics Concern  . Not on file  Social History Narrative   Marital status: married x 40  years; happily married; no abuse.      Children: 4 children (2 sons living; 1 son died in [id number] at age 57years old, 1 daughter); six grandchildren; no gg.      Lives:  With husband.      Employed: retired in 2011; [employer].      Tobacco: none       Alcohol:  None      Drugs: none      Exercise:  Rowing 3 days per week.      Seatbelt:  100%      Sunscreen:  Face SPF 15.      Guns:  Loaded mostly secured.      ADLs:  Drives minimally; no  assistant devices; independent with all ADLs.       Advanced Directives: YES; FULL CODE but no prolonged measures.      Her nocturia is improved under CPAP use at 10 cm water- AHI is now 0.8 and from 30 at baseline. CMS compliance  6 hours 40 minutes - sleeps on the side, 08-10-11 .FFM - likes it.    . Flonase used prn.     Caffeine Use: 1 cup daily   Social Determinants of Health   Financial Resource Strain: Not on file  Food Insecurity: Not on file  Transportation Needs: Not on file  Physical Activity: Not on file  Stress: Not on file  Social Connections: Not on file  Intimate Partner Violence: Not on file    Review of Systems Per hpi.   Objective:   Vitals:   04/14/20 0940  BP: 129/75  Pulse: 96  Temp: 98.5 F (36.9 C)  TempSrc: Temporal  SpO2: 97%  Weight: 212 lb (96.2 kg)  Height: 5\' 1"  (1.549 m)     Physical Exam Vitals reviewed.  Constitutional:      Appearance: She is well-developed and well-nourished.  HENT:     Head: Normocephalic and atraumatic.  Eyes:     Extraocular Movements: EOM normal.     Conjunctiva/sclera: Conjunctivae normal.     Pupils: Pupils are equal, round, and reactive to light.  Neck:     Vascular: No carotid bruit.  Cardiovascular:     Rate and Rhythm: Normal rate and regular rhythm.     Pulses: Intact distal pulses.     Heart sounds: Normal heart sounds.  Pulmonary:     Effort: Pulmonary effort is normal.     Breath sounds: Normal breath sounds.  Abdominal:     Palpations: Abdomen is soft. There is no pulsatile mass.     Tenderness: There is no abdominal tenderness.  Musculoskeletal:     Comments: No focal tenderness in the lower lumbar spine, locates area of discomfort at coccyx, lower sacrum  Skin:    General: Skin is warm and dry.  Neurological:     Mental Status: She is alert and oriented to person, place, and time.  Psychiatric:        Mood and Affect: Mood and affect normal.        Behavior: Behavior normal.    38  minutes spent during visit, greater than 50% counseling and assimilation of information, chart review, and discussion of plan.    Assessment & Plan:  FINLEY DINKEL is a 75 y.o. female . Primary hypertension - Plan: Comprehensive metabolic panel, Lipid panel  -Monitor home readings with previous lows.  If return of the readings, will need med adjustment.  RTC precautions as may need to verify meter accuracy as well.  Type 2 diabetes mellitus with hyperglycemia, without long-term current use of insulin (HCC) - Plan: Hemoglobin A1c, Microalbumin / creatinine urine ratio  -Check labs, no changes for now  Pure hypercholesterolemia - Plan: Comprehensive metabolic panel, Lipid panel  -Recommended restart of statin once per week, slowly increasing dosing as tolerated.  Tachycardia - Plan: TSH Screening for thyroid disorder - Plan: TSH  Coccydynia - Plan: DG Sacrum/Coccyx  -Check x-ray, consider PT versus Ortho eval if persistent, depending on imaging.  Tylenol for pain for now.  Type 2 diabetes mellitus without complication, without long-term current use of insulin ([place]) - Plan: metFORMIN (GLUCOPHAGE) 500 MG tablet  -As above  Meds ordered  this encounter  Medications  . metFORMIN (GLUCOPHAGE) 500 MG tablet    Sig: Take 1 tablet (500 mg total) by mouth daily with breakfast.    Dispense:  90 tablet    Refill:  1   Patient Instructions    Try restarting pravastain once per week and increase by one dose every 2 weeks if no return of pains.   Depending on labs, may need to adjust meds.   If low blood pressures return, we need to decrease your meds. Let me know. May need nurse visit to evaluate further with your meter. Make sure to drink plenty of fluids.   I will check coccyx xray, tylenol for now for tailbone pain. Recheck in next month.   Return to the clinic or go to the nearest emergency room if any of your symptoms worsen or new symptoms occur.   If you have lab work done today  you will be contacted with your lab results within the next 2 weeks.  If you have not heard from [place] then please contact us. The fastest way to get your results is to register for My Chart.   IF you received an x-ray today, you will receive an invoice from [facility]. Please contact [facility] at [phone] with questions or concerns regarding your invoice.   IF you received labwork today, you will receive an invoice from [facility]. Please contact [facility] at [phone] with questions or concerns regarding your invoice.   Our billing staff will not be able to assist you with questions regarding bills from these companies.  You will be contacted with the lab results as soon as they are available. The fastest way to get your results is to activate your My Chart account. Instructions are located on the last page of this paperwork. If you have not heard from [place] regarding the results in 2 weeks, please contact this office.         Signed, [name], MD [facility]

## 2020-04-14 NOTE — Patient Instructions (Addendum)
  Try restarting pravastain once per week and increase by one dose every 2 weeks if no return of pains.   Depending on labs, may need to adjust meds.   If low blood pressures return, we need to decrease your meds. Let me know. May need nurse visit to evaluate further with your meter. Make sure to drink plenty of fluids.   I will check coccyx xray, tylenol for now for tailbone pain. Recheck in next month.   Return to the clinic or go to the nearest emergency room if any of your symptoms worsen or new symptoms occur.   If you have lab work done today you will be contacted with your lab results within the next 2 weeks.  If you have not heard from [place] then please contact us. The fastest way to get your results is to register for My Chart.   IF you received an x-ray today, you will receive an invoice from [facility]. Please contact [facility] at [phone] with questions or concerns regarding your invoice.   IF you received labwork today, you will receive an invoice from [place]. Please contact [facility] at [phone] with questions or concerns regarding your invoice.   Our billing staff will not be able to assist you with questions regarding bills from these companies.  You will be contacted with the lab results as soon as they are available. The fastest way to get your results is to activate your My Chart account. Instructions are located on the last page of this paperwork. If you have not heard from [place] regarding the results in 2 weeks, please contact this office.

## 2020-04-15 LAB — COMPREHENSIVE METABOLIC PANEL
ALT: 16 IU/L (ref 0–32)
AST: 26 IU/L (ref 0–40)
Albumin/Globulin Ratio: 1.4 (ref 1.2–2.2)
Albumin: 4.1 g/dL (ref 3.7–4.7)
Alkaline Phosphatase: 93 IU/L (ref 44–121)
BUN/Creatinine Ratio: 22 (ref 12–28)
BUN: 17 mg/dL (ref 8–27)
Bilirubin Total: 0.6 mg/dL (ref 0.0–1.2)
CO2: 25 mmol/L (ref 20–29)
Calcium: 9.9 mg/dL (ref 8.7–10.3)
Chloride: 98 mmol/L (ref 96–106)
Creatinine, Ser: 0.78 mg/dL (ref 0.57–1.00)
GFR calc Af Amer: 86 mL/min/{1.73_m2} (ref >59)
GFR calc non Af Amer: 75 mL/min/{1.73_m2} (ref >59)
Globulin, Total: 2.9 g/dL (ref 1.5–4.5)
Glucose: 165 mg/dL — ABNORMAL HIGH (ref 65–99)
Potassium: 4.5 mmol/L (ref 3.5–5.2)
Sodium: 138 mmol/L (ref 134–144)
Total Protein: 7 g/dL (ref 6.0–8.5)

## 2020-04-15 LAB — HEMOGLOBIN A1C
Est. average glucose Bld gHb Est-mCnc: 154 mg/dL
Hgb A1c MFr Bld: 7 % — ABNORMAL HIGH (ref 4.8–5.6)

## 2020-04-15 LAB — LIPID PANEL
Chol/HDL Ratio: 5.4 ratio — ABNORMAL HIGH (ref 0.0–4.4)
Cholesterol, Total: 228 mg/dL — ABNORMAL HIGH (ref 100–199)
HDL: 42 mg/dL (ref >39)
LDL Chol Calc (NIH): 157 mg/dL — ABNORMAL HIGH (ref 0–99)
Triglycerides: 157 mg/dL — ABNORMAL HIGH (ref 0–149)
VLDL Cholesterol Cal: 29 mg/dL (ref 5–40)

## 2020-04-15 LAB — TSH: TSH: 2.01 u[IU]/mL (ref 0.450–4.500)

## 2020-04-15 LAB — MICROALBUMIN / CREATININE URINE RATIO
Creatinine, Urine: 133.2 mg/dL
Microalb/Creat Ratio: 9 mg/g creat (ref 0–29)
Microalbumin, Urine: 11.6 ug/mL

## 2020-04-16 ENCOUNTER — Encounter: Payer: Self-pay | Admitting: Family Medicine

## 2020-05-11 NOTE — Progress Notes (Signed)
Patient Care Team: Wendie Agreste, MD as PCP - General (Family Medicine) [name], MD (Family Medicine) Warden Fillers, MD as Consulting Physician (Ophthalmology) Zadie Rhine Clent Demark, MD as Consulting Physician ([facility])  DIAGNOSIS:    ICD-10-CM   1. Malignant neoplasm of central portion of left breast in female, estrogen receptor positive ([place])  C50.112    Z17.0     SUMMARY OF ONCOLOGIC HISTORY: Oncology History  Cancer of central portion of left female breast ([place])  08/13/2012 Mammogram   Questionable mass deep to the nipple superior periareolar region with calcifications.U/S 5 x 8 mm mass at 12:00, ovoid cystic lesion at 11:00 1.5X 0.4 cm   08/25/2012 Initial Diagnosis   Invasive mammary cancer with mammary cancer in situ grade 2 with lobular features ER 100%, PR 63%, Ki-67 58%, HER-2 positive ratio 2.48   08/26/2012 Breast MRI   Dominant, biopsied mass with small immediately adjacent satellite mass consistent with biopsy results indicating malignancy, measuring 17 x 11 x 68m   09/03/2012 Surgery   Left breast lumpectomy invasive lobular cancer with lymphovascular invasion and lobular carcinoma in situ positive margins one SLN negative, grade 2; 1.6 cm, ER 100%, PR 63%, HER-2 positive, Ki-67 58% T1 C. N0 M0 stage IA   10/21/2012 - 11/28/2012 Radiation Therapy   Radiation therapy to the lumpectomy site   12/01/2012 -  Anti-estrogen oral therapy   Arimidex 1 mg daily planned duration of treatment 5 years, switched to letrozole January 2016 for musculoskeletal aches and pains, switched to Tamoxifen 07/07/15     CHIEF COMPLIANT: Follow-up of left breast cancer    INTERVAL HISTORY: Diane BUEGEis a 75y.o. with above-mentioned history of left breast cancer treated with lumpectomy, radiation, and who is currently on tamoxifen. Mammogram on 01/01/20 showed no evidence of malignancy bilaterally. She presents to the clinic today for annual follow-up.  She was  diagnosed with COVID in D01/06/25and she stopped tamoxifen at that time. Her husband unfortunately died from Covid infection in D01/06/25 She tells me that since she stopped tamoxifen her right shoulder pain has gone away. She is accompanied today by her daughter.  ALLERGIES:  is allergic to peanut (diagnostic), adhesive [tape], crestor [rosuvastatin], latex, and lovastatin.  MEDICATIONS:  Current Outpatient Medications  Medication Sig Dispense Refill   aspirin 81 MG tablet Take 81 mg by mouth daily.     benzonatate (TESSALON) 100 MG capsule Take 1-2 capsules (100-200 mg total) by mouth 3 (three) times daily as needed for cough. 20 capsule 0   Brimonidine Tartrate-Timolol (COMBIGAN OP) Apply 1 drop to eye 2 (two) times daily. In both eyes.     cholecalciferol (VITAMIN D) 1000 units tablet Take 1,000 Units by mouth daily.     Cyanocobalamin (VITAMIN B-12 CR PO) Take 500 mcg by mouth daily.     ezetimibe (ZETIA) 10 MG tablet Take 1 tablet (10 mg total) by mouth daily after supper. 90 tablet 3   fluticasone (FLONASE) 50 MCG/ACT nasal spray Place 1 spray into both nostrils daily. 16 g 5   fluticasone (FLOVENT HFA) 44 MCG/ACT inhaler Inhale 2 puffs into the lungs 2 (two) times daily. With spacer. 1 Inhaler 5   glucose blood test strip Use as instructed 100 each 4   lisinopril-hydrochlorothiazide (ZESTORETIC) 20-25 MG tablet Take 1 tablet by mouth daily. 90 tablet 3   loratadine (CLARITIN) 10 MG tablet Take 1 tablet (10 mg total) by mouth daily.     metFORMIN (GLUCOPHAGE)  500 MG tablet Take 1 tablet (500 mg total) by mouth daily with breakfast. 90 tablet 1   metoprolol succinate (TOPROL-XL) 25 MG 24 hr tablet Take 1 tablet (25 mg total) by mouth daily. 90 tablet 3   pravastatin (PRAVACHOL) 20 MG tablet Take 20 mg by mouth every other day.     tamoxifen (NOLVADEX) 10 MG tablet Take 1 tablet (10 mg total) by mouth daily. (Patient taking differently: Take 10 mg by mouth every other day.)  90 tablet 3   Turmeric Curcumin 500 MG CAPS Take by mouth.     vitamin C (ASCORBIC ACID) 500 MG tablet Take 500 mg by mouth 2 (two) times daily.      Zinc 50 MG TABS      Current Facility-Administered Medications  Medication Dose Route Frequency Provider Last Rate Last Admin   EPINEPHrine (Anaphylaxis) SOLN 0.3 mg  0.3 mg Intramuscular Once Bobbitt, Sedalia Muta, MD        PHYSICAL EXAMINATION: ECOG PERFORMANCE STATUS: 1 - Symptomatic but completely ambulatory  Vitals:   05/12/20 0937  BP: (!) 134/55  Pulse: 78  Resp: 18  Temp: (!) 97.5 F (36.4 C)  SpO2: 100%   Filed Weights   05/12/20 0937  Weight: 215 lb 4.8 oz (97.7 kg)      LABORATORY DATA:  I have reviewed the data as listed CMP Latest Ref Rng & Units 04/14/2020 01/13/2020 07/01/2019  Glucose 65 - 99 mg/dL 165(H) 157(H) 180(H)  BUN 8 - 27 mg/dL _0 Creatinine 0.57 - 1.00 mg/dL 0.78 0.80 0.81  Sodium 134 - 144 mmol/L 138 137 137  Potassium 3.5 - 5.2 mmol/L 4.5 4.5 4.7  Chloride 96 - 106 mmol/L 98 96 99  CO2 20 - 29 mmol/L _1 Calcium 8.7 - 10.3 mg/dL 9.9 10.0 9.3  Total Protein 6.0 - 8.5 g/dL 7.0 6.8 6.7  Total Bilirubin 0.0 - 1.2 mg/dL 0.6 0.5 0.5  Alkaline Phos 44 - 121 IU/L 93 91 100  AST 0 - 40 IU/L _2 ALT 0 - 32 IU/L _3 Lab Results  Component Value Date   WBC 5.5 07/29/2017   HGB 13.0 07/29/2017   HCT 38.1 07/29/2017   MCV 87 07/29/2017   PLT 228 07/29/2017   NEUTROABS 3.3 07/29/2017    ASSESSMENT & PLAN:  Cancer of central portion of left female breast Left breast invasive lobular cancerER/PR positive HER-2 negative (initial biopsy showed HER-2 positive but final pathology on the lumpectomy did not) status post left breast lumpectomy and radiation and currently on antiestrogen therapy with Arimidex started 12/01/2012 switched to tamoxifen in 2017.  Tamoxifentoxicities:We discontinued tamoxifen therapy since she completed total of 7 years of treatment.   Breast  cancer surveillance: 1. Mammogram10/15/2021isbenignbreast density category B 2. Bone density June 2016: T score -1.4  Return to clinic on an as-needed basis.    No orders of the defined types were placed in this encounter.  The patient has a good understanding of the overall plan. she agrees with it. she will call with any problems that may develop before the next visit here.  Total time spent: 20 mins including face to face time and time spent for planning, charting and coordination of care  Rulon Eisenmenger, MD, MPH 05/12/2020  I, Cloyde Reams Dorshimer, am acting as scribe for Dr. Nicholas Lose.  I have reviewed the above documentation for accuracy and completeness, and I agree with the  above. ° ° ° ° ° ° °

## 2020-05-11 NOTE — Assessment & Plan Note (Signed)
Left breast invasive lobular cancerER/PR positive HER-2 negative (initial biopsy showed HER-2 positive but final pathology on the lumpectomy did not) status post left breast lumpectomy and radiation and currently on antiestrogen therapy with Arimidex started 12/01/2012 switched to tamoxifen in 2017.  Tamoxifentoxicities:Marked improvement in her ability to tolerate the medication. Denies any significant hot flashes. She does have muscle aches especially in the lower back but they're not as bad as before.  We discussed about the duration of antiestrogen therapy.  She will continue tamoxifen for another year and then we can stop it.  Qsymia  Breast cancer surveillance: 1. Breast exam2/24/2022isbenign 2. Mammogram10/15/2021isbenignbreast density category B 3. Bone density June 2016: T score -1.4  Return to clinic in 1 year for follow-up

## 2020-05-12 ENCOUNTER — Other Ambulatory Visit: Payer: Self-pay

## 2020-05-12 ENCOUNTER — Inpatient Hospital Stay: Payer: Medicare Other | Attending: Hematology and Oncology | Admitting: Hematology and Oncology

## 2020-05-12 DIAGNOSIS — Z8616 Personal history of COVID-19: Secondary | ICD-10-CM | POA: Insufficient documentation

## 2020-05-12 DIAGNOSIS — C50112 Malignant neoplasm of central portion of left female breast: Secondary | ICD-10-CM | POA: Insufficient documentation

## 2020-05-12 DIAGNOSIS — Z79810 Long term (current) use of selective estrogen receptor modulators (SERMs): Secondary | ICD-10-CM | POA: Insufficient documentation

## 2020-05-12 DIAGNOSIS — Z170 Estrogen receptor positive status [ER+]: Secondary | ICD-10-CM | POA: Diagnosis not present

## 2020-05-12 DIAGNOSIS — Z7982 Long term (current) use of aspirin: Secondary | ICD-10-CM | POA: Diagnosis not present

## 2020-05-12 DIAGNOSIS — Z7951 Long term (current) use of inhaled steroids: Secondary | ICD-10-CM | POA: Diagnosis not present

## 2020-05-12 DIAGNOSIS — Z7984 Long term (current) use of oral hypoglycemic drugs: Secondary | ICD-10-CM | POA: Insufficient documentation

## 2020-05-12 DIAGNOSIS — Z79899 Other long term (current) drug therapy: Secondary | ICD-10-CM | POA: Diagnosis not present

## 2020-05-12 DIAGNOSIS — Z923 Personal history of irradiation: Secondary | ICD-10-CM | POA: Insufficient documentation

## 2020-05-16 ENCOUNTER — Ambulatory Visit (INDEPENDENT_AMBULATORY_CARE_PROVIDER_SITE_OTHER): Payer: Medicare Other | Admitting: Ophthalmology

## 2020-05-16 ENCOUNTER — Encounter (INDEPENDENT_AMBULATORY_CARE_PROVIDER_SITE_OTHER): Payer: Self-pay | Admitting: Ophthalmology

## 2020-05-16 ENCOUNTER — Other Ambulatory Visit: Payer: Self-pay

## 2020-05-16 DIAGNOSIS — H2513 Age-related nuclear cataract, bilateral: Secondary | ICD-10-CM | POA: Diagnosis not present

## 2020-05-16 DIAGNOSIS — H35722 Serous detachment of retinal pigment epithelium, left eye: Secondary | ICD-10-CM

## 2020-05-16 DIAGNOSIS — H353221 Exudative age-related macular degeneration, left eye, with active choroidal neovascularization: Secondary | ICD-10-CM | POA: Diagnosis not present

## 2020-05-16 MED ORDER — BEVACIZUMAB 2.5 MG/0.1ML IZ SOSY
2.5000 mg | PREFILLED_SYRINGE | INTRAVITREAL | Status: AC | PRN
Start: 2020-05-16 — End: 2020-05-16
  Administered 2020-05-16: 2.5 mg via INTRAVITREAL

## 2020-05-16 NOTE — Assessment & Plan Note (Signed)
Patient deserves follow-up with [facility] regarding possible catheter extraction with intraocular lens placement

## 2020-05-16 NOTE — Progress Notes (Signed)
05/16/2020     CHIEF COMPLAINT Patient presents for Retina Follow Up (5 Week F/U OS, poss Avastin OS//Pt denies noticeable changes to [place] OU since last visit. Pt denies ocular pain, flashes of light, or floaters OU. //)   HISTORY OF PRESENT ILLNESS: [name] is a 75 y.o. female who presents to the clinic today for:   HPI    Retina Follow Up    Patient presents with  Wet AMD.  In left eye.  This started 5 weeks ago.  Severity is mild.  Duration of 5 weeks.  Since onset it is stable. Additional comments: 5 Week F/U OS, poss Avastin OS  Pt denies noticeable changes to [place] OU since last visit. Pt denies ocular pain, flashes of light, or floaters OU.          Last edited by Rockie Neighbours, [place] on 05/16/2020  9:02 AM. (History)      Referring physician: Wendie Agreste, MD [address] [place],  [place] [address]  HISTORICAL INFORMATION:   Selected notes from the MEDICAL RECORD NUMBER    Lab Results  Component Value Date   HGBA1C 7.0 (H) 04/14/2020     CURRENT MEDICATIONS: Current Outpatient Medications (Ophthalmic Drugs)  Medication Sig  . Brimonidine Tartrate-Timolol (COMBIGAN OP) Apply 1 drop to eye 2 (two) times daily. In both eyes.   No current facility-administered medications for this visit. (Ophthalmic Drugs)   Current Outpatient Medications (Other)  Medication Sig  . aspirin 81 MG tablet Take 81 mg by mouth daily.  . benzonatate (TESSALON) 100 MG capsule Take 1-2 capsules (100-200 mg total) by mouth 3 (three) times daily as needed for cough.  . cholecalciferol (VITAMIN D) 1000 units tablet Take 1,000 Units by mouth daily.  . Cyanocobalamin (VITAMIN B-12 CR PO) Take 500 mcg by mouth daily.  Marland Kitchen ezetimibe (ZETIA) 10 MG tablet Take 1 tablet (10 mg total) by mouth daily after supper.  . fluticasone (FLONASE) 50 MCG/ACT nasal spray Place 1 spray into both nostrils daily.  . fluticasone (FLOVENT HFA) 44 MCG/ACT inhaler Inhale 2 puffs into the lungs 2 (two) times  daily. With spacer.  Marland Kitchen glucose blood test strip Use as instructed  . lisinopril-hydrochlorothiazide (ZESTORETIC) 20-25 MG tablet Take 1 tablet by mouth daily.  Marland Kitchen loratadine (CLARITIN) 10 MG tablet Take 1 tablet (10 mg total) by mouth daily.  . metFORMIN (GLUCOPHAGE) 500 MG tablet Take 1 tablet (500 mg total) by mouth daily with breakfast.  . metoprolol succinate (TOPROL-XL) 25 MG 24 hr tablet Take 1 tablet (25 mg total) by mouth daily.  . Turmeric Curcumin 500 MG CAPS Take by mouth.  . vitamin C (ASCORBIC ACID) 500 MG tablet Take 500 mg by mouth 2 (two) times daily.   . Zinc 50 MG TABS    Current Facility-Administered Medications (Other)  Medication Route  . EPINEPHrine (Anaphylaxis) SOLN 0.3 mg Intramuscular      REVIEW OF SYSTEMS:    ALLERGIES Allergies  Allergen Reactions  . Peanut (Diagnostic) Cough, Hypertension and Shortness Of Breath  . Adhesive [Tape] Rash  . Crestor [Rosuvastatin] Other (See Comments)    Severe arthralgia and myalgia  . Latex Itching and Other (See Comments)    Causes blisters  . Lovastatin Other (See Comments)    Arthralgias, hyperglycemia with lovastatin.    PAST MEDICAL HISTORY Past Medical History:  Diagnosis Date  . Allergic rhinitis 03/23/2013  . Allergy   . Anemia    due to vaginal bleeding  .  Arthritis    hands  . Asthmatic bronchitis 09/03/2017  . Breast cancer ([place]) 08/2012   left  . Dental crowns present   . Eczema   . Fibroids   . Headache(784.0)    tension  . History of endometriosis   . Hyperlipidemia   . Hypertension    under control with med., has been on med. x 3 yr.  . OSA (obstructive sleep apnea)   . Postmenopausal bleeding   . Recurrent upper respiratory infection (URI)   . S/P radiation therapy 10/20/2012-12/05/2012   left breast 50.4 gray, lumpectomy cavity boosted to 62.4 gray  . Seasonal allergies   . Sleep apnea    uses CPAP nightly  . Urinary urgency   . White coat hypertension    Past Surgical History:   Procedure Laterality Date  . BACK SURGERY  1997   lumbar  . BREAST LUMPECTOMY WITH NEEDLE LOCALIZATION AND AXILLARY SENTINEL LYMPH NODE BX Left 09/03/2012   Procedure: BREAST LUMPECTOMY WITH NEEDLE LOCALIZATION AND AXILLARY SENTINEL LYMPH NODE BX;  Surgeon: Edward Jolly, MD;  Location: [facility];  Service: General;  Laterality: Left;  . CHOLECYSTECTOMY  1995  . COLONOSCOPY W/ POLYPECTOMY  03/20/2007  . DILATION AND CURETTAGE OF UTERUS  2005  . DILATION AND CURETTAGE OF UTERUS  06/22/2011   Procedure: DILATATION AND CURETTAGE;  Surgeon: Alvino Chapel, MD;  Location: [facility];  Service: Gynecology;  Laterality: N/A;  OK PER KEELA FOR 7:15 START  . DORSAL COMPARTMENT RELEASE Right 05/19/2013   Procedure: RELEASE 1ST  DORSAL COMPARTMENT RIGHT (DEQUERVAIN);  Surgeon: Cammie Sickle., MD;  Location: [facility];  Service: Orthopedics;  Laterality: Right;  . EXPLORATORY LAPAROTOMY  06-30-2007   ATTEMPTED HYSTERECTOMY ABORTED DUE TO EXTENSIVE ENDOMETRIOSIS W/ DENSE PELVIC ADHESIONS INVOLVING UTERUS AND LOWER RECTOSIGMOID  . HYSTEROSCOPY WITH D & C  05/06/2009    FAMILY HISTORY Family History  Problem Relation Age of Onset  . Vision loss Mother   . Hypertension Mother   . Anesthesia problems Mother        post-op N/V  . Heart disease Mother        carotid stenosis s/p CAE  . Osteoporosis Mother   . Eczema Mother   . Depression Father   . Heart disease Sister 18       arrhythmia  . Hypertension Sister   . Hyperlipidemia Sister   . Diabetes Brother   . Hypertension Brother   . Cancer Brother        skin  . Hyperlipidemia Brother   . Stroke Brother   . COPD Daughter   . Atopy Daughter   . Cancer Cousin   . Kidney failure Maternal Grandmother   . Heart disease Maternal Grandfather   . Hypertension Other   . Colon cancer Neg Hx   . Allergic rhinitis Neg Hx   . Asthma Neg Hx   . Urticaria Neg Hx     SOCIAL  HISTORY Social History   Tobacco Use  . Smoking status: Never Smoker  . Smokeless tobacco: Never Used  Vaping Use  . Vaping Use: Never used  Substance Use Topics  . Alcohol use: No    Alcohol/week: 0.0 standard drinks  . Drug use: No         OPHTHALMIC EXAM:  Base Eye Exam    Visual Acuity (ETDRS)      Right Left   Dist cc 20/60 +2 20/50  Dist ph cc 20/30 +2 20/40   Correction: Glasses  Pt sts [facility] is blurry with glasses today bc she just recently took her CLs out       Tonometry (Tonopen, 9:03 AM)      Right Left   Pressure 14 13       Pupils      Pupils Dark Light Shape React APD   Right PERRL 4 3 Round Brisk None   Left PERRL 4 3 Round Brisk None       Visual Fields (Counting fingers)      Left Right    Full Full       Extraocular Movement      Right Left    Full Full       Neuro/Psych    Oriented x3: Yes   Mood/Affect: Normal       Dilation    Left eye: 1.0% Mydriacyl, 2.5% Phenylephrine @ 9:06 AM        Slit Lamp and Fundus Exam    External Exam      Right Left   External Normal Normal       Slit Lamp Exam      Right Left   Lids/Lashes Normal Normal   Conjunctiva/Sclera White and quiet White and quiet   Cornea Clear Clear   Anterior Chamber Deep and quiet Deep and quiet   Iris Round and reactive Round and reactive   Lens 2+ Nuclear sclerosis 2+ Nuclear sclerosis   Anterior Vitreous Normal Normal       Fundus Exam      Right Left   Posterior Vitreous  Posterior vitreous detachment   Disc  Peripapillary atrophy   C/D Ratio  0.5   Macula  Retinal pigment epithelial mottling, no exudates, no hemorrhage,  macular thickening, Early age related macular degeneration, Pigmented atrophy, nonturbid subretinal fluid   Vessels  Normal   Periphery  Normal          IMAGING AND PROCEDURES  Imaging and Procedures for 05/16/20  OCT, Retina - OU - Both Eyes       Right Eye Quality was good. Scan locations included subfoveal. Central  Foveal Thickness: 238. Findings include abnormal foveal contour, no SRF, no IRF.   Left Eye Quality was good. Scan locations included subfoveal. Central Foveal Thickness: 259. Progression has been stable. Findings include abnormal foveal contour, subretinal fluid.   Notes Persistent subretinal fluid left eye, preserved good acuity left eye secondary to wet ARMD.  We will repeat injection today  OD with no signs of CNVM by OCT  Please note poor IV access for FFA in the past       Intravitreal Injection, Pharmacologic Agent - OS - Left Eye       Time Out 05/16/2020. 9:24 AM. Confirmed correct patient, procedure, site, and patient consented.   Anesthesia Topical anesthesia was used. Anesthetic medications included [place] 3.5%.   Procedure Preparation included Tobramycin 0.3%, 10% betadine to eyelids, 5% betadine to ocular surface. A 30 gauge needle was used.   Injection:  2.5 mg Bevacizumab (AVASTIN) 2.5mg /0.1mL SOSY   NDC: 09323-557-32, Lot: [id number]   Route: Intravitreal, Site: Left Eye  Post-op Post injection exam found visual acuity of at least counting fingers. The patient tolerated the procedure well. There were no complications. The patient received written and verbal post procedure care education.                 ASSESSMENT/PLAN:  Serous detachment of  retinal pigment epithelium of left eye Persistent subretinal fluid left eye in a foveal location.  Previous attempts at fluorescein angiography have been not successful due to poor IV access.  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 Exudative age-related macular degeneration of left eye with active choroidal neovascularization (HCC)  H35.3221 OCT, Retina - OU - Both Eyes    Intravitreal Injection, Pharmacologic Agent - OS - Left Eye    bevacizumab (AVASTIN) SOSY 2.5 mg  2. Serous detachment of retinal pigment epithelium of left eye  H35.722   3. Nuclear sclerotic cataract of both eyes  H25.13     1.  Repeat injection intravitreal Avastin OS to maintain visual function, yet with chronic subretinal fluid from wet AMD  2.  Patient does have medial opacity consistent with 20/40 20/50 vision in each eye and deserves evaluation by [facility] for consideration of cataract extraction with intraocular lens placement  3.  Ophthalmic Meds Ordered this visit:  Meds ordered this encounter  Medications  . bevacizumab (AVASTIN) SOSY 2.5 mg       Return in about 5 weeks (around 06/20/2020) for DILATE OU, AVASTIN OCT, OS.  There are no Patient Instructions on file for this visit.   Explained the diagnoses, plan, and follow up with the patient and they expressed understanding.  Patient expressed understanding of the importance of proper follow up care.   Clent Demark Quanika Solem M.D. Diseases & Surgery of the Retina and Vitreous [facility] [place] 05/16/20     Abbreviations: M myopia (nearsighted); A astigmatism; H hyperopia (farsighted); P presbyopia; Mrx spectacle prescription;  CTL contact lenses; OD right eye; OS left eye; OU both eyes  XT exotropia; ET esotropia; PEK punctate epithelial keratitis; PEE punctate epithelial erosions; DES dry eye syndrome; MGD meibomian gland dysfunction; ATs artificial tears; PFAT's preservative free artificial tears; [place] nuclear sclerotic cataract; PSC posterior subcapsular cataract; ERM epi-retinal membrane; PVD posterior vitreous detachment; RD retinal detachment; DM diabetes mellitus; DR diabetic retinopathy; NPDR non-proliferative diabetic  retinopathy; PDR proliferative diabetic retinopathy; CSME clinically significant macular edema; DME diabetic macular edema; dbh dot blot hemorrhages; CWS cotton wool spot; POAG primary open angle glaucoma; C/D cup-to-disc ratio; HVF humphrey visual field; GVF goldmann visual field; OCT optical coherence tomography; IOP intraocular pressure; BRVO Branch retinal vein occlusion; CRVO central retinal vein occlusion; CRAO central retinal artery occlusion; BRAO branch retinal artery occlusion; RT retinal tear; SB scleral buckle; PPV pars plana vitrectomy; VH Vitreous hemorrhage; PRP panretinal laser photocoagulation; IVK intravitreal kenalog; VMT vitreomacular traction; MH Macular hole;  NVD neovascularization of the disc; NVE neovascularization elsewhere; AREDS age related eye disease study; ARMD age related macular degeneration; POAG primary open angle glaucoma; EBMD epithelial/anterior basement membrane dystrophy; ACIOL anterior chamber intraocular lens; IOL intraocular lens; PCIOL posterior chamber intraocular lens; Phaco/IOL phacoemulsification with intraocular lens placement; [place] photorefractive keratectomy; LASIK laser assisted in situ keratomileusis; HTN hypertension; DM diabetes mellitus; COPD chronic obstructive pulmonary disease

## 2020-05-16 NOTE — Assessment & Plan Note (Signed)
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

## 2020-05-16 NOTE — Assessment & Plan Note (Signed)
Persistent subretinal fluid left eye in a foveal location.  Previous attempts at fluorescein angiography have been not successful due to poor IV access.

## 2020-05-19 ENCOUNTER — Other Ambulatory Visit: Payer: Self-pay

## 2020-05-19 ENCOUNTER — Encounter: Payer: Self-pay | Admitting: Family Medicine

## 2020-05-19 ENCOUNTER — Ambulatory Visit (INDEPENDENT_AMBULATORY_CARE_PROVIDER_SITE_OTHER): Payer: Medicare Other | Admitting: Family Medicine

## 2020-05-19 VITALS — BP 120/78 | HR 85 | Temp 97.6°F | Ht 61.0 in | Wt 213.0 lb

## 2020-05-19 DIAGNOSIS — L03012 Cellulitis of left finger: Secondary | ICD-10-CM

## 2020-05-19 DIAGNOSIS — I10 Essential (primary) hypertension: Secondary | ICD-10-CM | POA: Diagnosis not present

## 2020-05-19 DIAGNOSIS — M533 Sacrococcygeal disorders, not elsewhere classified: Secondary | ICD-10-CM | POA: Diagnosis not present

## 2020-05-19 DIAGNOSIS — E7800 Pure hypercholesterolemia, unspecified: Secondary | ICD-10-CM | POA: Diagnosis not present

## 2020-05-19 DIAGNOSIS — E1165 Type 2 diabetes mellitus with hyperglycemia: Secondary | ICD-10-CM

## 2020-05-19 MED ORDER — DOXYCYCLINE HYCLATE 100 MG PO TABS: 100.0000 mg | ORAL_TABLET | Freq: Two times a day (BID) | ORAL | 0 refills | Status: DC

## 2020-05-19 NOTE — Progress Notes (Signed)
Subjective:  Patient ID: [name], female    DOB: Sep 03, 1945  Age: 75 y.o. MRN: [id number]  CC:  Chief Complaint  Patient presents with  . Follow-up    On recent labs. Pt wants to know if she need to be taking the Flovent inhaler. Pt also wants to know if shee need to keep taking turmeric.  . Tailbone Pain    PT reports pain started 9 years ago after a fall on to her tail bone. PT had had a x-ray about this before. Pt reports the tylenol that was recommend hasn't helped pt has started she stopped taking it a week ago.   . possible infection    Pt has a possible infection on her L middle finger. Pt states a knife got her right on the knuckle. PT reports the area is tender and states the cut was white and black. Pt reports she has lost sleep because of the pain. Pt has soaked it in episome salt and was able to push the puss out.    HPI [name] presents for   Multiple concerns above.  Coccydynia Ongoing pain for years, reports fall about 9 years ago.  Symptoms were discussed at her last visit January 27.  Tried Tylenol with minimal relief.  Stopped taking it.  Sacrum/coccyx x-ray on 04/14/2020 with degenerative disc and facet disease changes at L4-5 but no sacrococcygeal fracture identified. No fever.  Pain had improved for awhile after first few years, then returned over past 2 years, noted at ngiht. More sore over time. Feels like a knot or swelling. No drainage/discharge. No visible swelling.     Hyperlipidemia: Recommended to restart statin at last visit.  Initially intermittent dosing then up to once daily as tolerated. Has not yet restarted.  Lab Results  Component Value Date   CHOL 228 (H) 04/14/2020   HDL 42 04/14/2020   LDLCALC 157 (H) 04/14/2020   TRIG 157 (H) 04/14/2020   CHOLHDL 5.4 (H) 04/14/2020   Lab Results  Component Value Date   ALT 16 04/14/2020   AST 26 04/14/2020   ALKPHOS 93 04/14/2020   BILITOT 0.6 04/14/2020    Left middle finger  infection Had an injury with a knife - stuck left middle finger with kitchen knife tip under nail/cuticle about 2 weeks ago.  2 nights ago was sore, yesterday am - noted pus under skin, some drainage today- started to drain this am. Used some Epsom salts.  No measured fevers. Feels fine today.  Tx: vaseline   Question about BP.  Low blood pressures at times.  Only on occasion.  Diastolic 00-93.  Head feels funny at times.  Lightheaded this morning, occasionally gets lightheaded. No syncope. On lisinopril hct 20/25 qd. Dose increased in 10/2019 by cardiology.  Stopped metoprolol temporarily for a few days, but HR increased. Restarted metoprolol Due to cost, wants to split current combo.  BP Readings from Last 3 Encounters:  05/19/20 120/78  05/12/20 (!) 134/55  04/14/20 129/75        History Patient Active Problem List   Diagnosis Date Noted  . Nuclear sclerotic cataract of both eyes 12/02/2019  . Class 3 severe obesity due to excess calories with serious comorbidity and body mass index (BMI) of 40.0 to 44.9 in adult [facility]) 07/09/2019  . OSA on CPAP 07/09/2019  . Glaucoma of right eye 07/09/2019  . Exudative age-related macular degeneration of left eye with active choroidal neovascularization ([place]) 07/06/2019  . Serous  detachment of retinal pigment epithelium of left eye 07/06/2019  . Intermediate stage nonexudative age-related macular degeneration of both eyes 07/06/2019  . Degenerative retinal drusen of left eye 07/06/2019  . Macular degeneration 03/25/2019  . Glaucoma 03/25/2019  . History of food allergy 10/22/2017  . Anaphylaxis 10/22/2017  . Food allergy 09/03/2017  . Mild persistent asthma 09/03/2017  . Spinal stenosis of lumbar region with neurogenic claudication 08/01/2016  . Pure hypercholesterolemia 08/01/2016  . Osteopenia 10/18/2014  . Obesity (BMI 30-39.9) 06/22/2014  . Perennial allergic rhinitis 03/23/2013  . Cancer of central portion of left female breast  ([place]) 08/25/2012  . Obstructive sleep apnea on CPAP 04/21/2012  . Diabetes ([place]) 12/28/2011  . HTN (hypertension) 12/28/2011  . Uterine bleeding 04/30/2011   Past Medical History:  Diagnosis Date  . Allergic rhinitis 03/23/2013  . Allergy   . Anemia    due to vaginal bleeding  . Arthritis    hands  . Asthmatic bronchitis 09/03/2017  . Breast cancer ([place]) 08/2012   left  . Dental crowns present   . Eczema   . Fibroids   . Headache(784.0)    tension  . History of endometriosis   . Hyperlipidemia   . Hypertension    under control with med., has been on med. x 3 yr.  . OSA (obstructive sleep apnea)   . Postmenopausal bleeding   . Recurrent upper respiratory infection (URI)   . S/P radiation therapy 10/20/2012-12/05/2012   left breast 50.4 gray, lumpectomy cavity boosted to 62.4 gray  . Seasonal allergies   . Sleep apnea    uses CPAP nightly  . Urinary urgency   . White coat hypertension    Past Surgical History:  Procedure Laterality Date  . BACK SURGERY  1997   lumbar  . BREAST LUMPECTOMY WITH NEEDLE LOCALIZATION AND AXILLARY SENTINEL LYMPH NODE BX Left 09/03/2012   Procedure: BREAST LUMPECTOMY WITH NEEDLE LOCALIZATION AND AXILLARY SENTINEL LYMPH NODE BX;  Surgeon: Edward Jolly, MD;  Location: [facility];  Service: General;  Laterality: Left;  . CHOLECYSTECTOMY  1995  . COLONOSCOPY W/ POLYPECTOMY  03/20/2007  . DILATION AND CURETTAGE OF UTERUS  2005  . DILATION AND CURETTAGE OF UTERUS  06/22/2011   Procedure: DILATATION AND CURETTAGE;  Surgeon: Alvino Chapel, MD;  Location: [facility];  Service: Gynecology;  Laterality: N/A;  OK PER KEELA FOR 7:15 START  . DORSAL COMPARTMENT RELEASE Right 05/19/2013   Procedure: RELEASE 1ST  DORSAL COMPARTMENT RIGHT (DEQUERVAIN);  Surgeon: Cammie Sickle., MD;  Location: [facility];  Service: Orthopedics;  Laterality: Right;  . EXPLORATORY LAPAROTOMY  06-30-2007   ATTEMPTED  HYSTERECTOMY ABORTED DUE TO EXTENSIVE ENDOMETRIOSIS W/ DENSE PELVIC ADHESIONS INVOLVING UTERUS AND LOWER RECTOSIGMOID  . HYSTEROSCOPY WITH D & C  05/06/2009   Allergies  Allergen Reactions  . Peanut (Diagnostic) Cough, Hypertension and Shortness Of Breath  . Adhesive [Tape] Rash  . Crestor [Rosuvastatin] Other (See Comments)    Severe arthralgia and myalgia  . Latex Itching and Other (See Comments)    Causes blisters  . Lovastatin Other (See Comments)    Arthralgias, hyperglycemia with lovastatin.   Prior to Admission medications   Medication Sig Start Date End Date Taking? Authorizing Provider  aspirin 81 MG tablet Take 81 mg by mouth daily.   Yes [provider]  benzonatate (TESSALON) 100 MG capsule Take 1-2 capsules (100-200 mg total) by mouth 3 (three) times daily as needed  for cough. 03/02/20  Yes Wendie Agreste, MD  Brimonidine Tartrate-Timolol (COMBIGAN OP) Apply 1 drop to eye 2 (two) times daily. In both eyes.   Yes [provider]  cholecalciferol (VITAMIN D) 1000 units tablet Take 1,000 Units by mouth daily.   Yes [provider]  Cyanocobalamin (VITAMIN B-12 CR PO) Take 500 mcg by mouth daily.   Yes [provider]  ezetimibe (ZETIA) 10 MG tablet Take 1 tablet (10 mg total) by mouth daily after supper. 11/13/19 11/07/20 Yes Adrian Prows, MD  fluticasone (FLONASE) 50 MCG/ACT nasal spray Place 1 spray into both nostrils daily. 09/03/17  Yes Bobbitt, [place] Muta, MD  fluticasone (FLOVENT HFA) 44 MCG/ACT inhaler Inhale 2 puffs into the lungs 2 (two) times daily. With spacer. 10/22/17  Yes Bobbitt, [place] Muta, MD  glucose blood test strip Use as instructed 01/13/20  Yes Wendie Agreste, MD  lisinopril-hydrochlorothiazide (ZESTORETIC) 20-25 MG tablet Take 1 tablet by mouth daily. 11/13/19  Yes Adrian Prows, MD  loratadine (CLARITIN) 10 MG tablet Take 1 tablet (10 mg total) by mouth daily. 05/12/18  Yes Nicholas Lose, MD  metFORMIN (GLUCOPHAGE) 500 MG  tablet Take 1 tablet (500 mg total) by mouth daily with breakfast. 04/14/20  Yes Wendie Agreste, MD  metoprolol succinate (TOPROL-XL) 25 MG 24 hr tablet Take 1 tablet (25 mg total) by mouth daily. 04/02/19  Yes Miquel Dunn, NP  Turmeric Curcumin 500 MG CAPS Take by mouth.   Yes [provider]  vitamin C (ASCORBIC ACID) 500 MG tablet Take 500 mg by mouth 2 (two) times daily.    Yes [provider]  Zinc 50 MG TABS  12/17/17  Yes [provider]  LISINOPRIL PO Take 1 tablet by mouth daily.  06/13/11  [provider]   Social History   Socioeconomic History  . Marital status: Married    Spouse name: [name]  . Number of children: 4  . Years of education: College  . Highest education level: Not on file  Occupational History  . Occupation: retired  Tobacco Use  . Smoking status: Never Smoker  . Smokeless tobacco: Never Used  Vaping Use  . Vaping Use: Never used  Substance and Sexual Activity  . Alcohol use: No    Alcohol/week: 0.0 standard drinks  . Drug use: No  . Sexual activity: Yes    Comment: menarche 35, P4, Premarin for short while, Provera x 3 years  Other Topics Concern  . Not on file  Social History Narrative   Marital status: married x 13  years; happily married; no abuse.      Children: 4 children (2 sons living; 1 son died in 2622 at age 30years old, 1 daughter); six grandchildren; no gg.      Lives:  With husband.      Employed: retired in 2011; [employer].      Tobacco: none       Alcohol:  None      Drugs: none      Exercise:  Rowing 3 days per week.      Seatbelt:  100%      Sunscreen:  Face SPF 15.      Guns:  Loaded mostly secured.      ADLs:  Drives minimally; no assistant devices; independent with all ADLs.       Advanced Directives: YES; FULL CODE but no prolonged measures.      Her nocturia is improved under CPAP  use at 10 cm water- AHI is now 0.8 and from 30 at baseline. CMS compliance  6  hours 40 minutes - sleeps on the side, 08-10-11 .FFM - likes it.    . Flonase used prn.     Caffeine Use: 1 cup daily   Social Determinants of Health   Financial Resource Strain: Not on file  Food Insecurity: Not on file  Transportation Needs: Not on file  Physical Activity: Not on file  Stress: Not on file  Social Connections: Not on file  Intimate Partner Violence: Not on file    Review of Systems Per ROS.   Objective:   Vitals:   05/19/20 1050  BP: 120/78  Pulse: 85  Temp: 97.6 F (36.4 C)  TempSrc: Temporal  SpO2: 100%  Weight: 213 lb (96.6 kg)  Height: 5\' 1"  (1.549 m)     Physical Exam Vitals reviewed. Exam conducted with a chaperone present Noah Delaine).  Constitutional:      General: She is not in acute distress.    Appearance: She is well-developed and well-nourished.  HENT:     Head: Normocephalic and atraumatic.  Cardiovascular:     Rate and Rhythm: Normal rate.  Pulmonary:     Effort: Pulmonary effort is normal.  Genitourinary:   Skin:    Comments: L 3rd phalynx - see photo. ttp with small white d/c from lateral nail fold.  Pad uninvolved.     Neurological:     Mental Status: She is alert and oriented to person, place, and time.  Psychiatric:        Mood and Affect: Mood and affect normal.         Assessment & Plan:  JACKSON FETTERS is a 75 y.o. female . Paronychia of finger of left hand - Plan: doxycycline (VIBRA-TABS) 100 MG tablet, WOUND CULTURE  -Check wound culture, start doxycycline.  Potential side effects and risks of meds discussed.  Encouraged as drained on own without significant fluctuance on exam.  Continue wound care, soaks, gentle pressure with urgent care/ER precautions given if worsening symptoms over the weekend.  RTC precautions.  Primary hypertension  -Possibly overtreated, will decrease lisinopril HCTZ to 1/2 pill for now, may be most cost effective.  Option to change to 20/12.5 mg dosing.  Home monitoring.   Type 2  diabetes mellitus with hyperglycemia, without long-term current use of insulin ([place]) Pure hypercholesterolemia  -Recommend to restart statin given history of diabetes although that was stable in January.  Intermittent dosing with increase as tolerated, stop if any side effects and can look at alternatives.  Coccydynia  -Possible lumbar source.  No sign of pilonidal abscess or acute skin findings seen on exam.  Asymptomatic on exam.  If symptoms return or recurrent, would recommend follow-up with spine specialist.  Meds ordered this encounter  Medications  . doxycycline (VIBRA-TABS) 100 MG tablet    Sig: Take 1 tablet (100 mg total) by mouth 2 (two) times daily.    Dispense:  20 tablet    Refill:  0   Patient Instructions   You can try restarting statin once per week, let me know if you have side effects and we can meet to discuss alternatives.   Try decreasing the lisinopril hct to 1/2 per day. Keep a record of your blood pressures outside of the office and bring them to the next office visit. If BP starts going over 140/90 - let me know.   Finger does appear to be infected from  previous wound.  See information below.  Start doxycycline 1 pill twice per day.  Soak hand a few times per day with gentle pressure to express pus from the finger.  If you notice any increase swelling, redness, redness spreading up the finger, or worsening symptoms be seen at the urgent care or ER this weekend.  Tailbone pain may be from your lower back. If that pain continues I would recommend meeting with specialist. Let me know and I am happy to place a referral.   Return to the clinic or go to the nearest emergency room if any of your symptoms worsen or new symptoms occur.   Paronychia Paronychia is an infection of the skin. It happens near a fingernail or toenail. It may cause pain and swelling around the nail. In some cases, a fluid-filled bump (abscess) can form near or under the nail. Usually, this  condition is not serious, and it clears up with treatment. Follow these instructions at home: Wound care  Keep the affected area clean.  Soak the fingers or toes in warm water as told by your doctor. You may be told to do this for 20 minutes, 2-3 times a day.  Keep the area dry when you are not soaking it.  Do not try to drain a fluid-filled bump on your own.  Follow instructions from your doctor about how to take care of the affected area. Make sure you: ? Wash your hands with soap and water before you change your bandage (dressing). If you cannot use soap and water, use hand sanitizer. ? Change your bandage as told by your doctor.  If you had a fluid-filled bump and your doctor drained it, check the area every day for signs of infection. Check for: ? Redness, swelling, or pain. ? Fluid or blood. ? Warmth. ? Pus or a bad smell. Medicines  Take over-the-counter and prescription medicines only as told by your doctor.  If you were prescribed an antibiotic medicine, take it as told by your doctor. Do not stop taking it even if you start to feel better.   General instructions  Avoid touching any chemicals.  Do not pick at the affected area. Prevention  To prevent this condition from happening again: ? Wear rubber gloves when putting your hands in water for washing dishes or other tasks. ? Wear gloves if your hands might touch cleaners or chemicals. ? Avoid injuring your nails or fingertips. ? Do not bite your nails or tear hangnails. ? Do not cut your nails very short. ? Do not cut the skin at the base and sides of the nail (cuticles). ? Use clean nail clippers or scissors when trimming nails. Contact a doctor if:  You feel worse.  You do not get better.  You have more fluid, blood, or pus coming from the affected area.  Your finger or knuckle is swollen or is hard to move. Get help right away if you have:  A fever or chills.  Redness spreading from the affected  area.  Pain in a joint or muscle. Summary  Paronychia is an infection of the skin. It happens near a fingernail or toenail.  This condition may cause pain and swelling around the nail.  Soak the fingers or toes in warm water as told by your doctor.  Usually, this condition is not serious, and it clears up with treatment. This information is not intended to replace advice given to you by your health care provider. Make sure you discuss  any questions you have with your health care provider. Document Revised: 12/30/2019 Document Reviewed: 12/30/2019 Elsevier Patient Education  2021 [employer]. If you have lab work done today you will be contacted with your lab results within the next 2 weeks.  If you have not heard from [place] then please contact us. The fastest way to get your results is to register for My Chart.   IF you received an x-ray today, you will receive an invoice from [facility]. Please contact [facility] at [phone] with questions or concerns regarding your invoice.   IF you received labwork today, you will receive an invoice from [place]. Please contact [facility] at [phone] with questions or concerns regarding your invoice.   Our billing staff will not be able to assist you with questions regarding bills from these companies.  You will be contacted with the lab results as soon as they are available. The fastest way to get your results is to activate your My Chart account. Instructions are located on the last page of this paperwork. If you have not heard from [place] regarding the results in 2 weeks, please contact this office.         Signed, [name], MD [facility]

## 2020-05-19 NOTE — Patient Instructions (Addendum)
You can try restarting statin once per week, let me know if you have side effects and we can meet to discuss alternatives.   Try decreasing the lisinopril hct to 1/2 per day. Keep a record of your blood pressures outside of the office and bring them to the next office visit. If BP starts going over 140/90 - let me know.   Finger does appear to be infected from previous wound.  See information below.  Start doxycycline 1 pill twice per day.  Soak hand a few times per day with gentle pressure to express pus from the finger.  If you notice any increase swelling, redness, redness spreading up the finger, or worsening symptoms be seen at the urgent care or ER this weekend.  Tailbone pain may be from your lower back. If that pain continues I would recommend meeting with specialist. Let me know and I am happy to place a referral.   Return to the clinic or go to the nearest emergency room if any of your symptoms worsen or new symptoms occur.   Paronychia Paronychia is an infection of the skin. It happens near a fingernail or toenail. It may cause pain and swelling around the nail. In some cases, a fluid-filled bump (abscess) can form near or under the nail. Usually, this condition is not serious, and it clears up with treatment. Follow these instructions at home: Wound care  Keep the affected area clean.  Soak the fingers or toes in warm water as told by your doctor. You may be told to do this for 20 minutes, 2-3 times a day.  Keep the area dry when you are not soaking it.  Do not try to drain a fluid-filled bump on your own.  Follow instructions from your doctor about how to take care of the affected area. Make sure you: ? Wash your hands with soap and water before you change your bandage (dressing). If you cannot use soap and water, use hand sanitizer. ? Change your bandage as told by your doctor.  If you had a fluid-filled bump and your doctor drained it, check the area every day for signs of  infection. Check for: ? Redness, swelling, or pain. ? Fluid or blood. ? Warmth. ? Pus or a bad smell. Medicines  Take over-the-counter and prescription medicines only as told by your doctor.  If you were prescribed an antibiotic medicine, take it as told by your doctor. Do not stop taking it even if you start to feel better.   General instructions  Avoid touching any chemicals.  Do not pick at the affected area. Prevention  To prevent this condition from happening again: ? Wear rubber gloves when putting your hands in water for washing dishes or other tasks. ? Wear gloves if your hands might touch cleaners or chemicals. ? Avoid injuring your nails or fingertips. ? Do not bite your nails or tear hangnails. ? Do not cut your nails very short. ? Do not cut the skin at the base and sides of the nail (cuticles). ? Use clean nail clippers or scissors when trimming nails. Contact a doctor if:  You feel worse.  You do not get better.  You have more fluid, blood, or pus coming from the affected area.  Your finger or knuckle is swollen or is hard to move. Get help right away if you have:  A fever or chills.  Redness spreading from the affected area.  Pain in a joint or muscle. Summary  Paronychia  is an infection of the skin. It happens near a fingernail or toenail.  This condition may cause pain and swelling around the nail.  Soak the fingers or toes in warm water as told by your doctor.  Usually, this condition is not serious, and it clears up with treatment. This information is not intended to replace advice given to you by your health care provider. Make sure you discuss any questions you have with your health care provider. Document Revised: 12/30/2019 Document Reviewed: 12/30/2019 Elsevier Patient Education  2021 [employer]. If you have lab work done today you will be contacted with your lab results within the next 2 weeks.  If you have not heard from [place] then please  contact us. The fastest way to get your results is to register for My Chart.   IF you received an x-ray today, you will receive an invoice from [facility]. Please contact [facility] at [phone] with questions or concerns regarding your invoice.   IF you received labwork today, you will receive an invoice from [place]. Please contact [facility] at [phone] with questions or concerns regarding your invoice.   Our billing staff will not be able to assist you with questions regarding bills from these companies.  You will be contacted with the lab results as soon as they are available. The fastest way to get your results is to activate your My Chart account. Instructions are located on the last page of this paperwork. If you have not heard from [place] regarding the results in 2 weeks, please contact this office.

## 2020-05-22 ENCOUNTER — Encounter: Payer: Self-pay | Admitting: Family Medicine

## 2020-05-22 LAB — WOUND CULTURE: Organism ID, Bacteria: NONE SEEN

## 2020-06-07 DIAGNOSIS — H2513 Age-related nuclear cataract, bilateral: Secondary | ICD-10-CM | POA: Diagnosis not present

## 2020-06-07 DIAGNOSIS — H353221 Exudative age-related macular degeneration, left eye, with active choroidal neovascularization: Secondary | ICD-10-CM | POA: Diagnosis not present

## 2020-06-07 DIAGNOSIS — H401111 Primary open-angle glaucoma, right eye, mild stage: Secondary | ICD-10-CM | POA: Diagnosis not present

## 2020-06-07 DIAGNOSIS — H0102A Squamous blepharitis right eye, upper and lower eyelids: Secondary | ICD-10-CM | POA: Diagnosis not present

## 2020-06-07 DIAGNOSIS — H0102B Squamous blepharitis left eye, upper and lower eyelids: Secondary | ICD-10-CM | POA: Diagnosis not present

## 2020-06-07 DIAGNOSIS — E119 Type 2 diabetes mellitus without complications: Secondary | ICD-10-CM | POA: Diagnosis not present

## 2020-06-07 DIAGNOSIS — H40012 Open angle with borderline findings, low risk, left eye: Secondary | ICD-10-CM | POA: Diagnosis not present

## 2020-06-20 ENCOUNTER — Other Ambulatory Visit: Payer: Self-pay

## 2020-06-20 ENCOUNTER — Encounter (INDEPENDENT_AMBULATORY_CARE_PROVIDER_SITE_OTHER): Payer: Self-pay | Admitting: Ophthalmology

## 2020-06-20 ENCOUNTER — Ambulatory Visit (INDEPENDENT_AMBULATORY_CARE_PROVIDER_SITE_OTHER): Payer: Medicare Other | Admitting: Ophthalmology

## 2020-06-20 DIAGNOSIS — E1165 Type 2 diabetes mellitus with hyperglycemia: Secondary | ICD-10-CM | POA: Diagnosis not present

## 2020-06-20 DIAGNOSIS — H2513 Age-related nuclear cataract, bilateral: Secondary | ICD-10-CM | POA: Diagnosis not present

## 2020-06-20 DIAGNOSIS — H353221 Exudative age-related macular degeneration, left eye, with active choroidal neovascularization: Secondary | ICD-10-CM

## 2020-06-20 MED ORDER — BEVACIZUMAB 2.5 MG/0.1ML IZ SOSY
2.5000 mg | PREFILLED_SYRINGE | INTRAVITREAL | Status: AC | PRN
Start: 2020-06-20 — End: 2020-06-20
  Administered 2020-06-20: 2.5 mg via INTRAVITREAL

## 2020-06-20 NOTE — Assessment & Plan Note (Signed)
Cataract surgery as scheduled

## 2020-06-20 NOTE — Assessment & Plan Note (Signed)
Patient encouraged to maintain excellent A1c no detectable diabetic retinopathy

## 2020-06-20 NOTE — Assessment & Plan Note (Signed)
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

## 2020-06-20 NOTE — Progress Notes (Signed)
06/20/2020     CHIEF COMPLAINT Patient presents for Retina Follow Up (5 Week Wet AMD f\u. Possible Avastin OS. OCT/Pt c/o vision being worse. Pt states letters were doubled when she had an exam with Dr. Katy Fitch.)   HISTORY OF PRESENT ILLNESS: [name] is a 75 y.o. female who presents to the clinic today for:   HPI    Retina Follow Up    Patient presents with  Diabetic Retinopathy.  In left eye.  Severity is moderate.  Duration of 5 weeks.  Since onset it is stable.  I, the attending physician,  performed the HPI with the patient and updated documentation appropriately. Additional comments: 5 Week Wet AMD f\u. Possible Avastin OS. OCT Pt c/o vision being worse. Pt states letters were doubled when she had an exam with Dr. Katy Fitch.       Last edited by Tilda Franco on 06/20/2020  8:25 AM. (History)      Referring physician: Wendie Agreste, MD [address] [place],  [place] [address]  HISTORICAL INFORMATION:   Selected notes from the MEDICAL RECORD NUMBER    Lab Results  Component Value Date   HGBA1C 7.0 (H) 04/14/2020     CURRENT MEDICATIONS: Current Outpatient Medications (Ophthalmic Drugs)  Medication Sig  . Brimonidine Tartrate-Timolol (COMBIGAN OP) Apply 1 drop to eye 2 (two) times daily. In both eyes.   No current facility-administered medications for this visit. (Ophthalmic Drugs)   Current Outpatient Medications (Other)  Medication Sig  . aspirin 81 MG tablet Take 81 mg by mouth daily.  . benzonatate (TESSALON) 100 MG capsule Take 1-2 capsules (100-200 mg total) by mouth 3 (three) times daily as needed for cough.  . cholecalciferol (VITAMIN D) 1000 units tablet Take 1,000 Units by mouth daily.  . Cyanocobalamin (VITAMIN B-12 CR PO) Take 500 mcg by mouth daily.  Marland Kitchen doxycycline (VIBRA-TABS) 100 MG tablet Take 1 tablet (100 mg total) by mouth 2 (two) times daily.  Marland Kitchen ezetimibe (ZETIA) 10 MG tablet Take 1 tablet (10 mg total) by mouth daily after supper.   . fluticasone (FLONASE) 50 MCG/ACT nasal spray Place 1 spray into both nostrils daily.  . fluticasone (FLOVENT HFA) 44 MCG/ACT inhaler Inhale 2 puffs into the lungs 2 (two) times daily. With spacer.  Marland Kitchen glucose blood test strip Use as instructed  . loratadine (CLARITIN) 10 MG tablet Take 1 tablet (10 mg total) by mouth daily.  . metFORMIN (GLUCOPHAGE) 500 MG tablet Take 1 tablet (500 mg total) by mouth daily with breakfast.  . metoprolol succinate (TOPROL-XL) 25 MG 24 hr tablet Take 1 tablet (25 mg total) by mouth daily.  . Turmeric Curcumin 500 MG CAPS Take by mouth.  . vitamin C (ASCORBIC ACID) 500 MG tablet Take 500 mg by mouth 2 (two) times daily.   . Zinc 50 MG TABS    Current Facility-Administered Medications (Other)  Medication Route  . EPINEPHrine (Anaphylaxis) SOLN 0.3 mg Intramuscular      REVIEW OF SYSTEMS:    ALLERGIES Allergies  Allergen Reactions  . Peanut (Diagnostic) Cough, Hypertension and Shortness Of Breath  . Adhesive [Tape] Rash  . Crestor [Rosuvastatin] Other (See Comments)    Severe arthralgia and myalgia  . Latex Itching and Other (See Comments)    Causes blisters  . Lovastatin Other (See Comments)    Arthralgias, hyperglycemia with lovastatin.    PAST MEDICAL HISTORY Past Medical History:  Diagnosis Date  . Allergic rhinitis 03/23/2013  .  Allergy   . Anemia    due to vaginal bleeding  . Arthritis    hands  . Asthmatic bronchitis 09/03/2017  . Breast cancer ([place]) 08/2012   left  . Dental crowns present   . Eczema   . Fibroids   . Headache(784.0)    tension  . History of endometriosis   . Hyperlipidemia   . Hypertension    under control with med., has been on med. x 3 yr.  . OSA (obstructive sleep apnea)   . Postmenopausal bleeding   . Recurrent upper respiratory infection (URI)   . S/P radiation therapy 10/20/2012-12/05/2012   left breast 50.4 gray, lumpectomy cavity boosted to 62.4 gray  . Seasonal allergies   . Sleep apnea    uses CPAP  nightly  . Urinary urgency   . White coat hypertension    Past Surgical History:  Procedure Laterality Date  . BACK SURGERY  1997   lumbar  . BREAST LUMPECTOMY WITH NEEDLE LOCALIZATION AND AXILLARY SENTINEL LYMPH NODE BX Left 09/03/2012   Procedure: BREAST LUMPECTOMY WITH NEEDLE LOCALIZATION AND AXILLARY SENTINEL LYMPH NODE BX;  Surgeon: Edward Jolly, MD;  Location: [facility];  Service: General;  Laterality: Left;  . CHOLECYSTECTOMY  1995  . COLONOSCOPY W/ POLYPECTOMY  03/20/2007  . DILATION AND CURETTAGE OF UTERUS  2005  . DILATION AND CURETTAGE OF UTERUS  06/22/2011   Procedure: DILATATION AND CURETTAGE;  Surgeon: Alvino Chapel, MD;  Location: [facility];  Service: Gynecology;  Laterality: N/A;  OK PER KEELA FOR 7:15 START  . DORSAL COMPARTMENT RELEASE Right 05/19/2013   Procedure: RELEASE 1ST  DORSAL COMPARTMENT RIGHT (DEQUERVAIN);  Surgeon: Cammie Sickle., MD;  Location[facility];  Service: Orthopedics;  Laterality: Right;  . EXPLORATORY LAPAROTOMY  06-30-2007   ATTEMPTED HYSTERECTOMY ABORTED DUE TO EXTENSIVE ENDOMETRIOSIS W/ DENSE PELVIC ADHESIONS INVOLVING UTERUS AND LOWER RECTOSIGMOID  . HYSTEROSCOPY WITH D & C  05/06/2009    FAMILY HISTORY Family History  Problem Relation Age of Onset  . Vision loss Mother   . Hypertension Mother   . Anesthesia problems Mother        post-op N/V  . Heart disease Mother        carotid stenosis s/p CAE  . Osteoporosis Mother   . Eczema Mother   . Depression Father   . Heart disease Sister 18       arrhythmia  . Hypertension Sister   . Hyperlipidemia Sister   . Diabetes Brother   . Hypertension Brother   . Cancer Brother        skin  . Hyperlipidemia Brother   . Stroke Brother   . COPD Daughter   . Atopy Daughter   . Cancer Cousin   . Kidney failure Maternal Grandmother   . Heart disease Maternal Grandfather   . Hypertension Other   . Colon cancer Neg Hx   .  Allergic rhinitis Neg Hx   . Asthma Neg Hx   . Urticaria Neg Hx     SOCIAL HISTORY Social History   Tobacco Use  . Smoking status: Never Smoker  . Smokeless tobacco: Never Used  Vaping Use  . Vaping Use: Never used  Substance Use Topics  . Alcohol use: No    Alcohol/week: 0.0 standard drinks  . Drug use: No         OPHTHALMIC EXAM:  Base Eye Exam    Visual Acuity (Snellen -  Linear)      Right Left   Dist cc 20/30 20/50 -1   Dist ph cc  20/40 -2   Correction: Glasses       Tonometry (Tonopen, 8:30 AM)      Right Left   Pressure 17 15       Pupils      Pupils Dark Light Shape React APD   Right PERRL 4 3 Round Slow None   Left PERRL 4 3 Round Slow None       Visual Fields (Counting fingers)      Left Right    Full Full       Neuro/Psych    Oriented x3: Yes   Mood/Affect: Normal       Dilation    Both eyes: 1.0% Mydriacyl, 2.5% Phenylephrine @ 8:30 AM        Slit Lamp and Fundus Exam    External Exam      Right Left   External Normal Normal       Slit Lamp Exam      Right Left   Lids/Lashes Normal Normal   Conjunctiva/Sclera White and quiet White and quiet   Cornea Clear Clear   Anterior Chamber Deep and quiet Deep and quiet   Iris Round and reactive Round and reactive   Lens 2+ Nuclear sclerosis 2+ Nuclear sclerosis   Anterior Vitreous Normal Normal       Fundus Exam      Right Left   Posterior Vitreous Posterior vitreous detachment Posterior vitreous detachment   Disc Peripapillary atrophy Peripapillary atrophy   C/D Ratio 0.35 0.5   Macula Pigmented atrophy, Retinal pigment epithelial mottling, Hard drusen, no exudates Retinal pigment epithelial mottling, no exudates, no hemorrhage,  macular thickening, Early age related macular degeneration, Pigmented atrophy, nonturbid subretinal fluid   Vessels Normal, , no DR Normal, , no DR   Periphery Normal Normal          IMAGING AND PROCEDURES  Imaging and Procedures for 06/20/20  OCT,  Retina - OU - Both Eyes       Right Eye Quality was good. Scan locations included subfoveal. Central Foveal Thickness: 240. Findings include abnormal foveal contour, no SRF, no IRF.   Left Eye Quality was good. Scan locations included subfoveal. Central Foveal Thickness: 266. Progression has been stable. Findings include abnormal foveal contour, subretinal fluid.   Notes Persistent subretinal fluid left eye, preserved good acuity left eye secondary to wet ARMD.  We will repeat injection today  OD with no signs of CNVM by OCT  Please note poor IV access for FFA in the past       Intravitreal Injection, Pharmacologic Agent - OS - Left Eye       Time Out 06/20/2020. 10:08 AM. Confirmed correct patient, procedure, site, and patient consented.   Anesthesia Topical anesthesia was used. Anesthetic medications included Akten 3.5%.   Procedure Preparation included Tobramycin 0.3%, 5% betadine to ocular surface. A 30 gauge needle was used.   Injection:  2.5 mg Bevacizumab (AVASTIN) 2.5mg /0.36mL SOSY   NDC: 82641-583-09, Lot: [id number]   Route: Intravitreal, Site: Left Eye  Post-op Post injection exam found visual acuity of at least counting fingers. The patient tolerated the procedure well. There were no complications. The patient received written and verbal post procedure care education.                 ASSESSMENT/PLAN:  Nuclear sclerotic cataract of both eyes Cataract surgery as  scheduled  Exudative age-related macular degeneration of left eye with active choroidal neovascularization (HCC) Persistent serous subfoveal detachment left eye, No worsening on therapy with good potential acuity, will continue to treat as wet ARMD  FFA attempt in the past but poor IV access.  Diabetes Patient encouraged to maintain excellent A1c no detectable diabetic retinopathy      ICD-10-CM   1. Exudative age-related macular degeneration of left eye with active choroidal  neovascularization (HCC)  H35.3221 OCT, Retina - OU - Both Eyes    Intravitreal Injection, Pharmacologic Agent - OS - Left Eye    bevacizumab (AVASTIN) SOSY 2.5 mg  2. Nuclear sclerotic cataract of both eyes  H25.13   3. Type 2 diabetes mellitus with hyperglycemia, without long-term current use of insulin (HCC)  E11.65     1.  We will repeat intravitreal Avastin OS today and examination again in 5 to 6 weeks  2.  3.  Ophthalmic Meds Ordered this visit:  Meds ordered this encounter  Medications  . bevacizumab (AVASTIN) SOSY 2.5 mg       Return in about 5 weeks (around 07/25/2020) for dilate, OS, AVASTIN OCT.  There are no Patient Instructions on file for this visit.   Explained the diagnoses, plan, and follow up with the patient and they expressed understanding.  Patient expressed understanding of the importance of proper follow up care.   Clent Demark Twania Bujak M.D. Diseases & Surgery of the Retina and Vitreous Retina & Diabetic [place] 06/20/20     Abbreviations: M myopia (nearsighted); A astigmatism; H hyperopia (farsighted); P presbyopia; Mrx spectacle prescription;  CTL contact lenses; OD right eye; OS left eye; OU both eyes  XT exotropia; ET esotropia; PEK punctate epithelial keratitis; PEE punctate epithelial erosions; DES dry eye syndrome; MGD meibomian gland dysfunction; ATs artificial tears; PFAT's preservative free artificial tears; [place] nuclear sclerotic cataract; PSC posterior subcapsular cataract; ERM epi-retinal membrane; PVD posterior vitreous detachment; RD retinal detachment; DM diabetes mellitus; DR diabetic retinopathy; NPDR non-proliferative diabetic retinopathy; PDR proliferative diabetic retinopathy; CSME clinically significant macular edema; DME diabetic macular edema; dbh dot blot hemorrhages; CWS cotton wool spot; POAG primary open angle glaucoma; C/D cup-to-disc ratio; HVF humphrey visual field; GVF goldmann visual field; OCT optical coherence tomography; IOP  intraocular pressure; BRVO Branch retinal vein occlusion; CRVO central retinal vein occlusion; CRAO central retinal artery occlusion; BRAO branch retinal artery occlusion; RT retinal tear; SB scleral buckle; PPV pars plana vitrectomy; VH Vitreous hemorrhage; PRP panretinal laser photocoagulation; IVK intravitreal kenalog; VMT vitreomacular traction; MH Macular hole;  NVD neovascularization of the disc; NVE neovascularization elsewhere; AREDS age related eye disease study; ARMD age related macular degeneration; POAG primary open angle glaucoma; EBMD epithelial/anterior basement membrane dystrophy; ACIOL anterior chamber intraocular lens; IOL intraocular lens; PCIOL posterior chamber intraocular lens; Phaco/IOL phacoemulsification with intraocular lens placement; [place] photorefractive keratectomy; LASIK laser assisted in situ keratomileusis; HTN hypertension; DM diabetes mellitus; COPD chronic obstructive pulmonary disease

## 2020-06-30 DIAGNOSIS — H2512 Age-related nuclear cataract, left eye: Secondary | ICD-10-CM | POA: Diagnosis not present

## 2020-07-25 ENCOUNTER — Ambulatory Visit (INDEPENDENT_AMBULATORY_CARE_PROVIDER_SITE_OTHER): Payer: Medicare Other | Admitting: Ophthalmology

## 2020-07-25 ENCOUNTER — Encounter (INDEPENDENT_AMBULATORY_CARE_PROVIDER_SITE_OTHER): Payer: Self-pay | Admitting: Ophthalmology

## 2020-07-25 ENCOUNTER — Other Ambulatory Visit: Payer: Self-pay

## 2020-07-25 DIAGNOSIS — H353221 Exudative age-related macular degeneration, left eye, with active choroidal neovascularization: Secondary | ICD-10-CM

## 2020-07-25 DIAGNOSIS — H00011 Hordeolum externum right upper eyelid: Secondary | ICD-10-CM | POA: Diagnosis not present

## 2020-07-25 DIAGNOSIS — H353132 Nonexudative age-related macular degeneration, bilateral, intermediate dry stage: Secondary | ICD-10-CM | POA: Diagnosis not present

## 2020-07-25 DIAGNOSIS — H2511 Age-related nuclear cataract, right eye: Secondary | ICD-10-CM | POA: Diagnosis not present

## 2020-07-25 HISTORY — DX: Hordeolum externum right upper eyelid: H00.011

## 2020-07-25 MED ORDER — BACITRACIN-POLYMYXIN B 500-10000 UNIT/GM OP OINT: TOPICAL_OINTMENT | Freq: Two times a day (BID) | OPHTHALMIC | 0 refills | Status: AC

## 2020-07-25 MED ORDER — BEVACIZUMAB 2.5 MG/0.1ML IZ SOSY
2.5000 mg | PREFILLED_SYRINGE | INTRAVITREAL | Status: AC | PRN
Start: 2020-07-25 — End: 2020-07-25
  Administered 2020-07-25: 2.5 mg via INTRAVITREAL

## 2020-07-25 NOTE — Assessment & Plan Note (Signed)
No signs of CNVM OD 

## 2020-07-25 NOTE — Progress Notes (Signed)
07/25/2020     CHIEF COMPLAINT Patient presents for Retina Follow Up (5 Wk F/U OS, poss Avastin OS//Pt sts she had cataract sx OS. Pt sts she can see better OS since sx. Pt sts VA OD is blurry bc she cannot wear her CL due to upcoming CEIOL OD.)   HISTORY OF PRESENT ILLNESS: [name] is a 75 y.o. female who presents to the clinic today for:   HPI    Retina Follow Up    Diagnosis: Wet AMD   Laterality: left eye   Onset: 5 weeks ago   Severity: mild   Duration: 5 weeks   Course: stable   Comments: 5 Wk F/U OS, poss Avastin OS  Pt sts she had cataract sx OS. Pt sts she can see better OS since sx. Pt sts VA OD is blurry bc she cannot wear her CL due to upcoming CEIOL OD.          Comments    OD not wearing old prescription, f awaiting cataract surgery OD, acuity OS is improved rather nicely post surgery       Last edited by Edmon Crapeankin, Stefon Ramthun A, MD on 07/25/2020 10:23 AM. (History)      Referring physician: Shade FloodGreene, Jeffrey R, MD [address] [place],  [place] [id number]  HISTORICAL INFORMATION:   Selected notes from the MEDICAL RECORD NUMBER    Lab Results  Component Value Date   HGBA1C 7.0 (H) 04/14/2020     CURRENT MEDICATIONS: Current Outpatient Medications (Ophthalmic Drugs)  Medication Sig  . bacitracin-polymyxin b (POLYSPORIN) ophthalmic ointment Place into the right eye 2 (two) times daily for 10 days. Place a 1/2 inch ribbon of ointment into the lower eyelid.  . Brimonidine Tartrate-Timolol (COMBIGAN OP) Apply 1 drop to eye 2 (two) times daily. In both eyes.   No current facility-administered medications for this visit. (Ophthalmic Drugs)   Current Outpatient Medications (Other)  Medication Sig  . aspirin 81 MG tablet Take 81 mg by mouth daily.  . benzonatate (TESSALON) 100 MG capsule Take 1-2 capsules (100-200 mg total) by mouth 3 (three) times daily as needed for cough.  . cholecalciferol (VITAMIN D) 1000 units tablet Take 1,000 Units by mouth daily.   . Cyanocobalamin (VITAMIN B-12 CR PO) Take 500 mcg by mouth daily.  Marland Kitchen. doxycycline (VIBRA-TABS) 100 MG tablet Take 1 tablet (100 mg total) by mouth 2 (two) times daily.  Marland Kitchen. ezetimibe (ZETIA) 10 MG tablet Take 1 tablet (10 mg total) by mouth daily after supper.  . fluticasone (FLONASE) 50 MCG/ACT nasal spray Place 1 spray into both nostrils daily.  . fluticasone (FLOVENT HFA) 44 MCG/ACT inhaler Inhale 2 puffs into the lungs 2 (two) times daily. With spacer.  Marland Kitchen. glucose blood test strip Use as instructed  . loratadine (CLARITIN) 10 MG tablet Take 1 tablet (10 mg total) by mouth daily.  . metFORMIN (GLUCOPHAGE) 500 MG tablet Take 1 tablet (500 mg total) by mouth daily with breakfast.  . metoprolol succinate (TOPROL-XL) 25 MG 24 hr tablet Take 1 tablet (25 mg total) by mouth daily.  . Turmeric Curcumin 500 MG CAPS Take by mouth.  . vitamin C (ASCORBIC ACID) 500 MG tablet Take 500 mg by mouth 2 (two) times daily.   . Zinc 50 MG TABS    Current Facility-Administered Medications (Other)  Medication Route  . EPINEPHrine (Anaphylaxis) SOLN 0.3 mg Intramuscular      REVIEW OF SYSTEMS:    ALLERGIES Allergies  Allergen Reactions  . Peanut (Diagnostic) Cough, Hypertension and Shortness Of Breath  . Adhesive [Tape] Rash  . Crestor [Rosuvastatin] Other (See Comments)    Severe arthralgia and myalgia  . Latex Itching and Other (See Comments)    Causes blisters  . Lovastatin Other (See Comments)    Arthralgias, hyperglycemia with lovastatin.    PAST MEDICAL HISTORY Past Medical History:  Diagnosis Date  . Allergic rhinitis 03/23/2013  . Allergy   . Anemia    due to vaginal bleeding  . Arthritis    hands  . Asthmatic bronchitis 09/03/2017  . Breast cancer ([place]) 08/2012   left  . Dental crowns present   . Eczema   . Fibroids   . Headache(784.0)    tension  . History of endometriosis   . Hyperlipidemia   . Hypertension    under control with med., has been on med. x 3 yr.  . OSA  (obstructive sleep apnea)   . Postmenopausal bleeding   . Recurrent upper respiratory infection (URI)   . S/P radiation therapy 10/20/2012-12/05/2012   left breast 50.4 gray, lumpectomy cavity boosted to 62.4 gray  . Seasonal allergies   . Sleep apnea    uses CPAP nightly  . Urinary urgency   . White coat hypertension    Past Surgical History:  Procedure Laterality Date  . BACK SURGERY  1997   lumbar  . BREAST LUMPECTOMY WITH NEEDLE LOCALIZATION AND AXILLARY SENTINEL LYMPH NODE BX Left 09/03/2012   Procedure: BREAST LUMPECTOMY WITH NEEDLE LOCALIZATION AND AXILLARY SENTINEL LYMPH NODE BX;  Surgeon: Edward Jolly, MD;  Location: [facility];  Service: General;  Laterality: Left;  . CHOLECYSTECTOMY  1995  . COLONOSCOPY W/ POLYPECTOMY  03/20/2007  . DILATION AND CURETTAGE OF UTERUS  2005  . DILATION AND CURETTAGE OF UTERUS  06/22/2011   Procedure: DILATATION AND CURETTAGE;  Surgeon: Alvino Chapel, MD;  Location: [facility];  Service: Gynecology;  Laterality: N/A;  OK PER KEELA FOR 7:15 START  . DORSAL COMPARTMENT RELEASE Right 05/19/2013   Procedure: RELEASE 1ST  DORSAL COMPARTMENT RIGHT (DEQUERVAIN);  Surgeon: Cammie Sickle., MD;  Location: [facility];  Service: Orthopedics;  Laterality: Right;  . EXPLORATORY LAPAROTOMY  06-30-2007   ATTEMPTED HYSTERECTOMY ABORTED DUE TO EXTENSIVE ENDOMETRIOSIS W/ DENSE PELVIC ADHESIONS INVOLVING UTERUS AND LOWER RECTOSIGMOID  . HYSTEROSCOPY WITH D & C  05/06/2009    FAMILY HISTORY Family History  Problem Relation Age of Onset  . Vision loss Mother   . Hypertension Mother   . Anesthesia problems Mother        post-op N/V  . Heart disease Mother        carotid stenosis s/p CAE  . Osteoporosis Mother   . Eczema Mother   . Depression Father   . Heart disease Sister 18       arrhythmia  . Hypertension Sister   . Hyperlipidemia Sister   . Diabetes Brother   . Hypertension Brother   . Cancer  Brother        skin  . Hyperlipidemia Brother   . Stroke Brother   . COPD Daughter   . Atopy Daughter   . Cancer Cousin   . Kidney failure Maternal Grandmother   . Heart disease Maternal Grandfather   . Hypertension Other   . Colon cancer Neg Hx   . Allergic rhinitis Neg Hx   . Asthma Neg Hx   . Urticaria Neg  Hx     SOCIAL HISTORY Social History   Tobacco Use  . Smoking status: Never Smoker  . Smokeless tobacco: Never Used  Vaping Use  . Vaping Use: Never used  Substance Use Topics  . Alcohol use: No    Alcohol/week: 0.0 standard drinks  . Drug use: No         OPHTHALMIC EXAM:  Base Eye Exam    Visual Acuity (ETDRS)      Right Left   Dist George CF at 3' 20/20   Dist ph  20/100 -1   Pt not wearing CL due to upcoming CEIOL OD       Tonometry (Tonopen, 9:28 AM)      Right Left   Pressure 09 06       Pupils      Pupils Dark Light Shape React APD   Right PERRL 4 3 Round Slow None   Left PERRL 4 3 Round Slow None       Visual Fields (Counting fingers)      Left Right    Full Full       Extraocular Movement      Right Left    Full Full       Neuro/Psych    Oriented x3: Yes   Mood/Affect: Normal       Dilation    Left eye: 1.0% Mydriacyl, 2.5% Phenylephrine @ 9:28 AM        Slit Lamp and Fundus Exam    External Exam      Right Left   External Normal Normal       Slit Lamp Exam      Right Left   Lids/Lashes Hordeolum - upper lid, External hordeolum - upper lid Normal   Conjunctiva/Sclera White and quiet White and quiet   Cornea Clear Clear   Anterior Chamber Deep and quiet Deep and quiet   Iris Round and reactive Round and reactive   Lens 2+ Nuclear sclerosis Posterior chamber intraocular lens   Anterior Vitreous Normal Normal       Fundus Exam      Right Left   Posterior Vitreous  Posterior vitreous detachment   Disc  Peripapillary atrophy   C/D Ratio  0.5   Macula  Retinal pigment epithelial mottling, no exudates, no hemorrhage,   macular thickening, Early age related macular degeneration, Pigmented atrophy, nonturbid subretinal fluid   Vessels  Normal, , no DR   Periphery  Normal          IMAGING AND PROCEDURES  Imaging and Procedures for 07/25/20  OCT, Retina - OU - Both Eyes       Right Eye Quality was good. Scan locations included subfoveal. Central Foveal Thickness: 243. Progression has been stable. Findings include retinal drusen , no IRF, no SRF.   Left Eye Quality was good. Scan locations included subfoveal. Central Foveal Thickness: 272. Progression has been stable. Findings include subretinal fluid, pigment epithelial detachment, no IRF.   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  Repeat injection today       Intravitreal Injection, Pharmacologic Agent - OS - Left Eye       Time Out 07/25/2020. 10:26 AM. Confirmed correct patient, procedure, site, and patient consented.   Anesthesia Topical anesthesia was used. Anesthetic medications included Akten 3.5%.   Procedure Preparation included Tobramycin 0.3%, 5% betadine to ocular surface. A 30 gauge  needle was used.   Injection:  2.5 mg Bevacizumab (AVASTIN) 2.5mg /0.3mL SOSY   NDC: 93235-573-22, Lot: [id number]   Route: Intravitreal, Site: Left Eye  Post-op Post injection exam found visual acuity of at least counting fingers. The patient tolerated the procedure well. There were no complications. The patient received written and verbal post procedure care education.                 ASSESSMENT/PLAN:  Nuclear sclerotic cataract of right eye Scheduled for cataract extraction with intraocular lens placement May 26 Dr. Duanne Guess  Exudative age-related macular degeneration of left eye with active choroidal neovascularization ([place]) Excellent acuity, vascularized PED with chronic subretinal serous  detachment, stable and not progressive on chronic intravitreal Avastin repeat injection today  Intermediate stage nonexudative age-related macular degeneration of both eyes No signs of CNVM OD  Hordeolum externum right upper eyelid Patient has planned cataract surgery in the right eye coming up.  We will start Polysporin ophthalmic ointment to use twice daily and patient to notify [facility] of these new findings and therapy started      ICD-10-CM   1. Exudative age-related macular degeneration of left eye with active choroidal neovascularization (H[facility])  H35.3221 OCT, Retina - OU - Both Eyes    Intravitreal Injection, Pharmacologic Agent - OS - Left Eye    bevacizumab (AVASTIN) SOSY 2.5 mg  2. Nuclear sclerotic cataract of right eye  H25.11   3. Intermediate stage nonexudative age-related macular degeneration of both eyes  H35.3132   4. Hordeolum externum right upper eyelid  H00.011     1.  Repeat injection intravitreal Avastin OS today to control serous component of CNVM valve vascularized PED  2.  Unrelated external hordeolum upper eyelid right eye, will commence with therapy topical ophthalmic ointment as patient prepares for cataract surgery in the coming 3 weeks with Dr. Duanne Guess  3.  Patient to call that office to notify them of these findings and we will send this note to them  Ophthalmic Meds Ordered this visit:  Meds ordered this encounter  Medications  . bevacizumab (AVASTIN) SOSY 2.5 mg  . bacitracin-polymyxin b (POLYSPORIN) ophthalmic ointment    Sig: Place into the right eye 2 (two) times daily for 10 days. Place a 1/2 inch ribbon of ointment into the lower eyelid.    Dispense:  3 each    Refill:  0       Return in about 5 weeks (around 08/29/2020) for dilate, OS, AVASTIN OCT.  There are no Patient Instructions on file for this visit.   Explained the diagnoses, plan, and follow up with the patient and they expressed understanding.  Patient expressed  understanding of the importance of proper follow up care.   Clent Demark Vlasta Baskin M.D. Diseases & Surgery of the Retina and Vitreous Retina & Diabetic [place] 07/25/20     Abbreviations: M myopia (nearsighted); A astigmatism; H hyperopia (farsighted); P presbyopia; Mrx spectacle prescription;  CTL contact lenses; OD right eye; OS left eye; OU both eyes  XT exotropia; ET esotropia; PEK punctate epithelial keratitis; PEE punctate epithelial erosions; DES dry eye syndrome; MGD meibomian gland dysfunction; ATs artificial tears; PFAT's preservative free artificial tears; Halbur nuclear sclerotic cataract; PSC posterior subcapsular cataract; ERM epi-retinal membrane; PVD posterior vitreous detachment; RD retinal detachment; DM diabetes mellitus; DR diabetic retinopathy; NPDR non-proliferative diabetic retinopathy; PDR proliferative diabetic retinopathy; CSME clinically significant macular edema; DME diabetic macular edema; dbh dot blot hemorrhages; CWS cotton wool spot;  POAG primary open angle glaucoma; C/D cup-to-disc ratio; HVF humphrey visual field; GVF goldmann visual field; OCT optical coherence tomography; IOP intraocular pressure; BRVO Branch retinal vein occlusion; CRVO central retinal vein occlusion; CRAO central retinal artery occlusion; BRAO branch retinal artery occlusion; RT retinal tear; SB scleral buckle; PPV pars plana vitrectomy; VH Vitreous hemorrhage; PRP panretinal laser photocoagulation; IVK intravitreal kenalog; VMT vitreomacular traction; MH Macular hole;  NVD neovascularization of the disc; NVE neovascularization elsewhere; AREDS age related eye disease study; ARMD age related macular degeneration; POAG primary open angle glaucoma; EBMD epithelial/anterior basement membrane dystrophy; ACIOL anterior chamber intraocular lens; IOL intraocular lens; PCIOL posterior chamber intraocular lens; Phaco/IOL phacoemulsification with intraocular lens placement; Brady photorefractive keratectomy; LASIK laser  assisted in situ keratomileusis; HTN hypertension; DM diabetes mellitus; COPD chronic obstructive pulmonary disease

## 2020-07-25 NOTE — Assessment & Plan Note (Signed)
Scheduled for cataract extraction with intraocular lens placement May 26 Dr. Duanne Guess

## 2020-07-25 NOTE — Assessment & Plan Note (Signed)
Excellent acuity, vascularized PED with chronic subretinal serous detachment, stable and not progressive on chronic intravitreal Avastin repeat injection today

## 2020-07-25 NOTE — Assessment & Plan Note (Signed)
Patient has planned cataract surgery in the right eye coming up.  We will start Polysporin ophthalmic ointment to use twice daily and patient to notify [facility] of these new findings and therapy started

## 2020-08-08 DIAGNOSIS — Z961 Presence of intraocular lens: Secondary | ICD-10-CM | POA: Diagnosis not present

## 2020-08-08 DIAGNOSIS — H2511 Age-related nuclear cataract, right eye: Secondary | ICD-10-CM | POA: Diagnosis not present

## 2020-08-11 DIAGNOSIS — H2511 Age-related nuclear cataract, right eye: Secondary | ICD-10-CM | POA: Diagnosis not present

## 2020-08-24 ENCOUNTER — Other Ambulatory Visit: Payer: Self-pay

## 2020-08-24 MED ORDER — METOPROLOL SUCCINATE ER 25 MG PO TB24: 25.0000 mg | ORAL_TABLET | Freq: Every day | ORAL | 0 refills | Status: DC

## 2020-08-29 ENCOUNTER — Encounter (INDEPENDENT_AMBULATORY_CARE_PROVIDER_SITE_OTHER): Payer: Self-pay | Admitting: Ophthalmology

## 2020-08-29 ENCOUNTER — Other Ambulatory Visit: Payer: Self-pay

## 2020-08-29 ENCOUNTER — Ambulatory Visit (INDEPENDENT_AMBULATORY_CARE_PROVIDER_SITE_OTHER): Payer: Medicare Other | Admitting: Ophthalmology

## 2020-08-29 DIAGNOSIS — Z961 Presence of intraocular lens: Secondary | ICD-10-CM

## 2020-08-29 DIAGNOSIS — H35722 Serous detachment of retinal pigment epithelium, left eye: Secondary | ICD-10-CM

## 2020-08-29 DIAGNOSIS — H353221 Exudative age-related macular degeneration, left eye, with active choroidal neovascularization: Secondary | ICD-10-CM

## 2020-08-29 MED ORDER — BEVACIZUMAB 2.5 MG/0.1ML IZ SOSY
2.5000 mg | PREFILLED_SYRINGE | INTRAVITREAL | Status: AC | PRN
  Administered 2020-08-29: 11:00:00 2.5 mg via INTRAVITREAL

## 2020-08-29 NOTE — Assessment & Plan Note (Signed)
Stabilized subretinal fluid at 5-week interval.  May need to consider delivery of intravitreal Eylea to further resolve subretinal fluid.

## 2020-08-29 NOTE — Assessment & Plan Note (Addendum)
Observe looks great post recent cataract extraction with excellent visual acuity recovered for distance

## 2020-08-29 NOTE — Assessment & Plan Note (Signed)
As component of subretinal wet AMD, controlled but with good acuity

## 2020-08-29 NOTE — Progress Notes (Signed)
08/29/2020     CHIEF COMPLAINT Patient presents for Retina Follow Up (5 week fu OS and Avastin OS/Pt states, "I am doing OK. I jsut had cat sx OD about 3 weeks ago. My distance vision is pretty good. I am still using the combination drop from the surgery."/A1C:7.0/LBS: Unknown)   HISTORY OF PRESENT ILLNESS: [name] is a 75 y.o. female who presents to the clinic today for:   HPI     Retina Follow Up           Diagnosis: Wet AMD   Laterality: left eye   Onset: 5 weeks ago   Severity: mild   Duration: 5 weeks   Course: stable   Comments: 5 week fu OS and Avastin OS Pt states, "I am doing OK. I jsut had cat sx OD about 3 weeks ago. My distance vision is pretty good. I am still using the combination drop from the surgery." A1C:7.0 LBS: Unknown       Last edited by Kendra Opitz, COA on 08/29/2020  9:30 AM.      Referring physician: Wendie Agreste, MD [address] [place],  [place] [address]  HISTORICAL INFORMATION:   Selected notes from the MEDICAL RECORD NUMBER    Lab Results  Component Value Date   HGBA1C 7.0 (H) 04/14/2020     CURRENT MEDICATIONS: Current Outpatient Medications (Ophthalmic Drugs)  Medication Sig   Brimonidine Tartrate-Timolol (COMBIGAN OP) Apply 1 drop to eye 2 (two) times daily. In both eyes.   No current facility-administered medications for this visit. (Ophthalmic Drugs)   Current Outpatient Medications (Other)  Medication Sig   aspirin 81 MG tablet Take 81 mg by mouth daily.   benzonatate (TESSALON) 100 MG capsule Take 1-2 capsules (100-200 mg total) by mouth 3 (three) times daily as needed for cough.   cholecalciferol (VITAMIN D) 1000 units tablet Take 1,000 Units by mouth daily.   Cyanocobalamin (VITAMIN B-12 CR PO) Take 500 mcg by mouth daily.   doxycycline (VIBRA-TABS) 100 MG tablet Take 1 tablet (100 mg total) by mouth 2 (two) times daily.   ezetimibe (ZETIA) 10 MG tablet Take 1 tablet (10 mg total) by mouth daily  after supper.   fluticasone (FLONASE) 50 MCG/ACT nasal spray Place 1 spray into both nostrils daily.   fluticasone (FLOVENT HFA) 44 MCG/ACT inhaler Inhale 2 puffs into the lungs 2 (two) times daily. With spacer.   glucose blood test strip Use as instructed   loratadine (CLARITIN) 10 MG tablet Take 1 tablet (10 mg total) by mouth daily.   metFORMIN (GLUCOPHAGE) 500 MG tablet Take 1 tablet (500 mg total) by mouth daily with breakfast.   metoprolol succinate (TOPROL-XL) 25 MG 24 hr tablet Take 1 tablet (25 mg total) by mouth daily.   Turmeric Curcumin 500 MG CAPS Take by mouth.   vitamin C (ASCORBIC ACID) 500 MG tablet Take 500 mg by mouth 2 (two) times daily.    Zinc 50 MG TABS    Current Facility-Administered Medications (Other)  Medication Route   EPINEPHrine (Anaphylaxis) SOLN 0.3 mg Intramuscular      REVIEW OF SYSTEMS:    ALLERGIES Allergies  Allergen Reactions   Peanut (Diagnostic) Cough, Hypertension and Shortness Of Breath   Adhesive [Tape] Rash   Crestor [Rosuvastatin] Other (See Comments)    Severe arthralgia and myalgia   Latex Itching and Other (See Comments)    Causes blisters   Lovastatin Other (See Comments)  Arthralgias, hyperglycemia with lovastatin.    PAST MEDICAL HISTORY Past Medical History:  Diagnosis Date   Allergic rhinitis 03/23/2013   Allergy    Anemia    due to vaginal bleeding   Arthritis    hands   Asthmatic bronchitis 09/03/2017   Breast cancer ([facility]) 08/2012   left   Dental crowns present    Eczema    Fibroids    Headache(784.0)    tension   History of endometriosis    Hyperlipidemia    Hypertension    under control with med., has been on med. x 3 yr.   Nuclear sclerotic cataract of right eye 12/02/2019   OSA (obstructive sleep apnea)    Postmenopausal bleeding    Recurrent upper respiratory infection (URI)    S/P radiation therapy 10/20/2012-12/05/2012   left breast 50.4 gray, lumpectomy cavity boosted to 62.4 gray   Seasonal  allergies    Sleep apnea    uses CPAP nightly   Urinary urgency    White coat hypertension    Past Surgical History:  Procedure Laterality Date   BACK SURGERY  1997   lumbar   BREAST LUMPECTOMY WITH NEEDLE LOCALIZATION AND AXILLARY SENTINEL LYMPH NODE BX Left 09/03/2012   Procedure: BREAST LUMPECTOMY WITH NEEDLE LOCALIZATION AND AXILLARY SENTINEL LYMPH NODE BX;  Surgeon: Edward Jolly, MD;  Location: [facility];  Service: General;  Laterality: Left;   CHOLECYSTECTOMY  1995   COLONOSCOPY W/ POLYPECTOMY  03/20/2007   DILATION AND CURETTAGE OF UTERUS  2005   DILATION AND CURETTAGE OF UTERUS  06/22/2011   Procedure: DILATATION AND CURETTAGE;  Surgeon: Alvino Chapel, MD;  Location: [facility];  Service: Gynecology;  Laterality: N/A;  OK PER KEELA FOR 7:15 START   DORSAL COMPARTMENT RELEASE Right 05/19/2013   Procedure: RELEASE 1ST  DORSAL COMPARTMENT RIGHT (DEQUERVAIN);  Surgeon: Cammie Sickle., MD;  Location: [facility];  Service: Orthopedics;  Laterality: Right;   EXPLORATORY LAPAROTOMY  06-30-2007   ATTEMPTED HYSTERECTOMY ABORTED DUE TO EXTENSIVE ENDOMETRIOSIS W/ DENSE PELVIC ADHESIONS INVOLVING UTERUS AND LOWER RECTOSIGMOID   HYSTEROSCOPY WITH D & C  05/06/2009    FAMILY HISTORY Family History  Problem Relation Age of Onset   Vision loss Mother    Hypertension Mother    Anesthesia problems Mother        post-op N/V   Heart disease Mother        carotid stenosis s/p CAE   Osteoporosis Mother    Eczema Mother    Depression Father    Heart disease Sister 68       arrhythmia   Hypertension Sister    Hyperlipidemia Sister    Diabetes Brother    Hypertension Brother    Cancer Brother        skin   Hyperlipidemia Brother    Stroke Brother    COPD Daughter    Atopy Daughter    Cancer Cousin    Kidney failure Maternal Grandmother    Heart disease Maternal Grandfather    Hypertension Other    Colon cancer Neg Hx     Allergic rhinitis Neg Hx    Asthma Neg Hx    Urticaria Neg Hx     SOCIAL HISTORY Social History   Tobacco Use   Smoking status: Never   Smokeless tobacco: Never  Vaping Use   Vaping Use: Never used  Substance Use Topics   Alcohol use: No  Alcohol/week: 0.0 standard drinks   Drug use: No         OPHTHALMIC EXAM:  Base Eye Exam     Visual Acuity (ETDRS)       Right Left   Dist [place] 20/20 -1 20/20 -1         Tonometry (Tonopen, 9:33 AM)       Right Left   Pressure 18 15         Pupils       Pupils Dark Light Shape React APD   Right PERRL 4 3 Round Slow None   Left PERRL 4 3 Round Slow None         Visual Fields (Counting fingers)       Left Right    Full Full         Extraocular Movement       Right Left    Full Full         Neuro/Psych     Oriented x3: Yes   Mood/Affect: Normal         Dilation     Left eye: 1.0% Mydriacyl, 2.5% Phenylephrine @ 9:33 AM           Slit Lamp and Fundus Exam     External Exam       Right Left   External Normal Normal         Slit Lamp Exam       Right Left   Lids/Lashes Hordeolum - upper lid, External hordeolum - upper lid Normal   Conjunctiva/Sclera White and quiet White and quiet   Cornea Clear Clear   Anterior Chamber Deep and quiet Deep and quiet   Iris Round and reactive Round and reactive   Lens 2+ Nuclear sclerosis Posterior chamber intraocular lens   Anterior Vitreous Normal Normal         Fundus Exam       Right Left   Posterior Vitreous  Posterior vitreous detachment   Disc  Peripapillary atrophy   C/D Ratio  0.5   Macula  Retinal pigment epithelial mottling, no exudates, no hemorrhage,  macular thickening, Early age related macular degeneration, Pigmented atrophy, nonturbid subretinal fluid   Vessels  Normal, , no DR   Periphery  Normal            IMAGING AND PROCEDURES  Imaging and Procedures for 08/29/20  OCT, Retina - OU - Both Eyes       Right  Eye Quality was good. Scan locations included subfoveal. Central Foveal Thickness: 244. Progression has been stable. Findings include retinal drusen , no IRF, no SRF.   Left Eye Quality was good. Scan locations included subfoveal. Central Foveal Thickness: 267. Progression has improved. Findings include subretinal fluid, pigment epithelial detachment, no IRF.   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  Repeat injection today     Intravitreal Injection, Pharmacologic Agent - OS - Left Eye       Time Out 08/29/2020. 10:52 AM. Confirmed correct patient, procedure, site, and patient consented.   Anesthesia Topical anesthesia was used. Anesthetic medications included Akten 3.5%.   Procedure Preparation included Tobramycin 0.3%, 5% betadine to ocular surface. A 30 gauge needle was used.   Injection: 2.5 mg bevacizumab 2.5 MG/0.1ML   Route: Intravitreal   NDC: 96789-381-01, Lot: [id number]   Post-op Post injection exam found visual acuity of at least counting  fingers. The patient tolerated the procedure well. There were no complications. The patient received written and verbal post procedure care education.              ASSESSMENT/PLAN:  Pseudophakia of right eye Observe looks great post recent cataract extraction with excellent visual acuity recovered for distance  Exudative age-related macular degeneration of left eye with active choroidal neovascularization (HCC) Stabilized subretinal fluid at 5-week interval.  May need to consider delivery of intravitreal Eylea to further resolve subretinal fluid.  Serous detachment of retinal pigment epithelium of left eye As component of subretinal wet AMD, controlled but with good acuity     ICD-10-CM   1. Exudative age-related macular degeneration of left eye with active choroidal neovascularization (HCC)   H35.3221 OCT, Retina - OU - Both Eyes    Intravitreal Injection, Pharmacologic Agent - OS - Left Eye    bevacizumab (AVASTIN) SOSY 2.5 mg    2. Pseudophakia of right eye  Z96.1     3. Serous detachment of retinal pigment epithelium of left eye  H35.722       1.  OS with chronic serous retinal detachment subfoveal location.  On intravitreal Avastin now since December 2021.  Still no resolution.  Was on Eylea prior to that  May be showing signs of resistance to both Avastin and Eylea.  Preserved acuity however  2.  Will repeat Avastin OS today and examination next in 5 weeks and consider Lucentis 0.5 next in case resistance has developed.  3.  Ophthalmic Meds Ordered this visit:  Meds ordered this encounter  Medications   bevacizumab (AVASTIN) SOSY 2.5 mg       Return in about 5 weeks (around 10/03/2020) for dilate, OS, LUCENTIS 0.5 OCT.  There are no Patient Instructions on file for this visit.   Explained the diagnoses, plan, and follow up with the patient and they expressed understanding.  Patient expressed understanding of the importance of proper follow up care.   Clent Demark Madilyn Cephas M.D. Diseases & Surgery of the Retina and Vitreous [facility] 08/29/20     Abbreviations: M myopia (nearsighted); A astigmatism; H hyperopia (farsighted); P presbyopia; Mrx spectacle prescription;  CTL contact lenses; OD right eye; OS left eye; OU both eyes  XT exotropia; ET esotropia; PEK punctate epithelial keratitis; PEE punctate epithelial erosions; DES dry eye syndrome; MGD meibomian gland dysfunction; ATs artificial tears; PFAT's preservative free artificial tears; Welsh nuclear sclerotic cataract; PSC posterior subcapsular cataract; ERM epi-retinal membrane; PVD posterior vitreous detachment; RD retinal detachment; DM diabetes mellitus; DR diabetic retinopathy; NPDR non-proliferative diabetic retinopathy; PDR proliferative diabetic retinopathy; CSME clinically significant  macular edema; DME diabetic macular edema; dbh dot blot hemorrhages; CWS cotton wool spot; POAG primary open angle glaucoma; C/D cup-to-disc ratio; HVF humphrey visual field; GVF goldmann visual field; OCT optical coherence tomography; IOP intraocular pressure; BRVO Branch retinal vein occlusion; CRVO central retinal vein occlusion; CRAO central retinal artery occlusion; BRAO branch retinal artery occlusion; RT retinal tear; SB scleral buckle; PPV pars plana vitrectomy; VH Vitreous hemorrhage; PRP panretinal laser photocoagulation; IVK intravitreal kenalog; VMT vitreomacular traction; MH Macular hole;  NVD neovascularization of the disc; NVE neovascularization elsewhere; AREDS age related eye disease study; ARMD age related macular degeneration; POAG primary open angle glaucoma; EBMD epithelial/anterior basement membrane dystrophy; ACIOL anterior chamber intraocular lens; IOL intraocular lens; PCIOL posterior chamber intraocular lens; Phaco/IOL phacoemulsification with intraocular lens placement; PRK photorefractive keratectomy; LASIK laser assisted in situ keratomileusis; HTN hypertension; DM diabetes  mellitus; COPD chronic obstructive pulmonary disease

## 2020-10-03 ENCOUNTER — Ambulatory Visit (INDEPENDENT_AMBULATORY_CARE_PROVIDER_SITE_OTHER): Payer: Medicare Other | Admitting: Ophthalmology

## 2020-10-03 ENCOUNTER — Encounter (INDEPENDENT_AMBULATORY_CARE_PROVIDER_SITE_OTHER): Payer: Self-pay | Admitting: Ophthalmology

## 2020-10-03 ENCOUNTER — Other Ambulatory Visit: Payer: Self-pay

## 2020-10-03 DIAGNOSIS — G4733 Obstructive sleep apnea (adult) (pediatric): Secondary | ICD-10-CM | POA: Diagnosis not present

## 2020-10-03 DIAGNOSIS — H00011 Hordeolum externum right upper eyelid: Secondary | ICD-10-CM | POA: Diagnosis not present

## 2020-10-03 DIAGNOSIS — H35722 Serous detachment of retinal pigment epithelium, left eye: Secondary | ICD-10-CM

## 2020-10-03 DIAGNOSIS — H353132 Nonexudative age-related macular degeneration, bilateral, intermediate dry stage: Secondary | ICD-10-CM | POA: Diagnosis not present

## 2020-10-03 DIAGNOSIS — Z9989 Dependence on other enabling machines and devices: Secondary | ICD-10-CM

## 2020-10-03 DIAGNOSIS — H353221 Exudative age-related macular degeneration, left eye, with active choroidal neovascularization: Secondary | ICD-10-CM

## 2020-10-03 MED ORDER — RANIBIZUMAB 0.5 MG/0.05ML IZ SOSY
0.5000 mg | PREFILLED_SYRINGE | INTRAVITREAL | Status: AC | PRN
Start: 2020-10-03 — End: 2020-10-03
  Administered 2020-10-03: .5 mg via INTRAVITREAL

## 2020-10-03 NOTE — Assessment & Plan Note (Signed)
Patient reports ongoing excellent compliance

## 2020-10-03 NOTE — Progress Notes (Signed)
10/03/2020     CHIEF COMPLAINT Patient presents for Macular Degeneration   HISTORY OF PRESENT ILLNESS: [name] is a 75 y.o. female who presents to the clinic today for:     Referring physician: Wendie Agreste, MD [address] [place],  [place] [address]  HISTORICAL INFORMATION:   Selected notes from the MEDICAL RECORD NUMBER    Lab Results  Component Value Date   HGBA1C 7.0 (H) 04/14/2020     CURRENT MEDICATIONS: Current Outpatient Medications (Ophthalmic Drugs)  Medication Sig   Brimonidine Tartrate-Timolol (COMBIGAN OP) Apply 1 drop to eye 2 (two) times daily. In both eyes.   No current facility-administered medications for this visit. (Ophthalmic Drugs)   Current Outpatient Medications (Other)  Medication Sig   aspirin 81 MG tablet Take 81 mg by mouth daily.   benzonatate (TESSALON) 100 MG capsule Take 1-2 capsules (100-200 mg total) by mouth 3 (three) times daily as needed for cough.   cholecalciferol (VITAMIN D) 1000 units tablet Take 1,000 Units by mouth daily.   Cyanocobalamin (VITAMIN B-12 CR PO) Take 500 mcg by mouth daily.   doxycycline (VIBRA-TABS) 100 MG tablet Take 1 tablet (100 mg total) by mouth 2 (two) times daily.   ezetimibe (ZETIA) 10 MG tablet Take 1 tablet (10 mg total) by mouth daily after supper.   fluticasone (FLONASE) 50 MCG/ACT nasal spray Place 1 spray into both nostrils daily.   fluticasone (FLOVENT HFA) 44 MCG/ACT inhaler Inhale 2 puffs into the lungs 2 (two) times daily. With spacer.   glucose blood test strip Use as instructed   loratadine (CLARITIN) 10 MG tablet Take 1 tablet (10 mg total) by mouth daily.   metFORMIN (GLUCOPHAGE) 500 MG tablet Take 1 tablet (500 mg total) by mouth daily with breakfast.   metoprolol succinate (TOPROL-XL) 25 MG 24 hr tablet Take 1 tablet (25 mg total) by mouth daily.   Turmeric Curcumin 500 MG CAPS Take by mouth.   vitamin C (ASCORBIC ACID) 500 MG tablet Take 500 mg by mouth 2 (two) times  daily.    Zinc 50 MG TABS    Current Facility-Administered Medications (Other)  Medication Route   EPINEPHrine (Anaphylaxis) SOLN 0.3 mg Intramuscular      REVIEW OF SYSTEMS:    ALLERGIES Allergies  Allergen Reactions   Peanut (Diagnostic) Cough, Hypertension and Shortness Of Breath   Adhesive [Tape] Rash   Crestor [Rosuvastatin] Other (See Comments)    Severe arthralgia and myalgia   Latex Itching and Other (See Comments)    Causes blisters   Lovastatin Other (See Comments)    Arthralgias, hyperglycemia with lovastatin.    PAST MEDICAL HISTORY Past Medical History:  Diagnosis Date   Allergic rhinitis 03/23/2013   Allergy    Anemia    due to vaginal bleeding   Arthritis    hands   Asthmatic bronchitis 09/03/2017   Breast cancer ([place]) 08/2012   left   Dental crowns present    Eczema    Fibroids    Headache(784.0)    tension   History of endometriosis    Hordeolum externum right upper eyelid 07/25/2020   New finding OD today not noticed by the patient prior   Hyperlipidemia    Hypertension    under control with med., has been on med. x 3 yr.   Nuclear sclerotic cataract of right eye 12/02/2019   OSA (obstructive sleep apnea)    Postmenopausal bleeding    Recurrent upper  respiratory infection (URI)    S/P radiation therapy 10/20/2012-12/05/2012   left breast 50.4 gray, lumpectomy cavity boosted to 62.4 gray   Seasonal allergies    Sleep apnea    uses CPAP nightly   Urinary urgency    White coat hypertension    Past Surgical History:  Procedure Laterality Date   BACK SURGERY  1997   lumbar   BREAST LUMPECTOMY WITH NEEDLE LOCALIZATION AND AXILLARY SENTINEL LYMPH NODE BX Left 09/03/2012   Procedure: BREAST LUMPECTOMY WITH NEEDLE LOCALIZATION AND AXILLARY SENTINEL LYMPH NODE BX;  Surgeon: Edward Jolly, MD;  Location: [facility];  Service: General;  Laterality: Left;   CHOLECYSTECTOMY  1995   COLONOSCOPY W/ POLYPECTOMY  03/20/2007   DILATION AND  CURETTAGE OF UTERUS  2005   DILATION AND CURETTAGE OF UTERUS  06/22/2011   Procedure: DILATATION AND CURETTAGE;  Surgeon: Alvino Chapel, MD;  Location: [facility];  Service: Gynecology;  Laterality: N/A;  OK PER KEELA FOR 7:15 START   DORSAL COMPARTMENT RELEASE Right 05/19/2013   Procedure: RELEASE 1ST  DORSAL COMPARTMENT RIGHT (DEQUERVAIN);  Surgeon: Cammie Sickle., MD;  Location: [facility];  Service: Orthopedics;  Laterality: Right;   EXPLORATORY LAPAROTOMY  06-30-2007   ATTEMPTED HYSTERECTOMY ABORTED DUE TO EXTENSIVE ENDOMETRIOSIS W/ DENSE PELVIC ADHESIONS INVOLVING UTERUS AND LOWER RECTOSIGMOID   HYSTEROSCOPY WITH D & C  05/06/2009    FAMILY HISTORY Family History  Problem Relation Age of Onset   Vision loss Mother    Hypertension Mother    Anesthesia problems Mother        post-op N/V   Heart disease Mother        carotid stenosis s/p CAE   Osteoporosis Mother    Eczema Mother    Depression Father    Heart disease Sister 38       arrhythmia   Hypertension Sister    Hyperlipidemia Sister    Diabetes Brother    Hypertension Brother    Cancer Brother        skin   Hyperlipidemia Brother    Stroke Brother    COPD Daughter    Atopy Daughter    Cancer Cousin    Kidney failure Maternal Grandmother    Heart disease Maternal Grandfather    Hypertension Other    Colon cancer Neg Hx    Allergic rhinitis Neg Hx    Asthma Neg Hx    Urticaria Neg Hx     SOCIAL HISTORY Social History   Tobacco Use   Smoking status: Never   Smokeless tobacco: Never  Vaping Use   Vaping Use: Never used  Substance Use Topics   Alcohol use: No    Alcohol/week: 0.0 standard drinks   Drug use: No         OPHTHALMIC EXAM:  Base Eye Exam     Visual Acuity (ETDRS)       Right Left   [place] 20/15 -1 20/20 +2         Tonometry (Tonopen, 10:19 AM)       Right Left   Pressure 13 13         Pupils       Pupils React APD   Right  PERRL Slow None   Left PERRL Slow None         Visual Fields       Left Right    Full Full  Extraocular Movement       Right Left    Full, Ortho Full, Ortho         Neuro/Psych     Oriented x3: Yes   Mood/Affect: Normal         Dilation     Both eyes:            Slit Lamp and Fundus Exam     External Exam       Right Left   External Normal Normal         Slit Lamp Exam       Right Left   Lids/Lashes Normal Normal   Conjunctiva/Sclera White and quiet White and quiet   Cornea Clear Clear   Anterior Chamber Deep and quiet Deep and quiet   Iris Round and reactive Round and reactive   Lens 2+ Nuclear sclerosis 2+ Nuclear sclerosis   Anterior Vitreous Normal Normal         Fundus Exam       Right Left   Posterior Vitreous Posterior vitreous detachment Posterior vitreous detachment   Disc Peripapillary atrophy Peripapillary atrophy   C/D Ratio 0.35 0.5   Macula Pigmented atrophy, Retinal pigment epithelial mottling, Hard drusen, no exudates Retinal pigment epithelial mottling, no exudates, no hemorrhage,  macular thickening, Early age related macular degeneration, Pigmented atrophy, nonturbid subretinal fluid   Vessels Normal, , no DR Normal, , no DR   Periphery Normal Normal            IMAGING AND PROCEDURES  Imaging and Procedures for 10/03/20  OCT, Retina - OU - Both Eyes       Right Eye Quality was good. Scan locations included subfoveal. Central Foveal Thickness: 247. Progression has been stable. Findings include retinal drusen , no IRF, no SRF.   Left Eye Quality was good. Scan locations included subfoveal. Central Foveal Thickness: 273. Progression has improved. Findings include subretinal fluid, pigment epithelial detachment, no IRF.   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  Repeat  injection today     Intravitreal Injection, Pharmacologic Agent - OS - Left Eye       Time Out 10/03/2020. 10:37 AM. Confirmed correct patient, procedure, site, and patient consented.   Anesthesia Topical anesthesia was used. Anesthetic medications included [place] 3.5%.   Procedure Preparation included Tobramycin 0.3%, 5% betadine to ocular surface. A 30 gauge needle was used.   Injection: 0.5 mg Ranibizumab 0.5 MG/0.05ML   Route: Intravitreal, Site: Left Eye   NDC: 90240-973-53, Lot: [id number], Waste: 0 mL   Post-op Post injection exam found visual acuity of at least counting fingers. The patient tolerated the procedure well. There were no complications. The patient received written and verbal post procedure care education.              ASSESSMENT/PLAN:  Intermediate stage nonexudative age-related macular degeneration of both eyes No signs of CNVM formation OD  OSA on CPAP Patient reports ongoing excellent compliance  Exudative age-related macular degeneration of left eye with active choroidal neovascularization (HCC) Chronic subfoveal serous retinal detachment left eye, not responsive to antivegF Avastin or Eylea.  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 Exudative age-related macular degeneration of left eye with active choroidal neovascularization ([place])  H35.3221 OCT, Retina - OU - Both Eyes    Intravitreal Injection, Pharmacologic Agent - OS - Left Eye    Ranibizumab SOSY 0.5 mg    2. Hordeolum externum right upper eyelid  H00.011     3. Intermediate stage nonexudative age-related macular degeneration of both eyes  H35.3132 OCT, Retina - OU - Both Eyes    4. OSA on CPAP  G47.33    Z99.89     5. Serous detachment of retinal pigment epithelium of left eye  H35.722       1.  Chronic persistent subfoveal serous retinal  detachment left eye likely from wet AMD.  Not responsive to prior therapeutic intervention with antivegF.  We will change today to Lucentis 0.5 mg injection  2.  3.  Ophthalmic Meds Ordered this visit:  Meds ordered this encounter  Medications   Ranibizumab SOSY 0.5 mg       Return in about 5 weeks (around 11/07/2020) for dilate, OS, LUCENTIS 0.5 OCT.  There are no Patient Instructions on file for this visit.   Explained the diagnoses, plan, and follow up with the patient and they expressed understanding.  Patient expressed understanding of the importance of proper follow up care.   Clent Demark Rukaya Kleinschmidt M.D. Diseases & Surgery of the Retina and Vitreous Retina & Diabetic [place] 10/03/20     Abbreviations: M myopia (nearsighted); A astigmatism; H hyperopia (farsighted); P presbyopia; Mrx spectacle prescription;  CTL contact lenses; OD right eye; OS left eye; OU both eyes  XT exotropia; ET esotropia; PEK punctate epithelial keratitis; PEE punctate epithelial erosions; DES dry eye syndrome; MGD meibomian gland dysfunction; ATs artificial tears; PFAT's preservative free artificial tears; [place] nuclear sclerotic cataract; PSC posterior subcapsular cataract; ERM epi-retinal membrane; PVD posterior vitreous detachment; RD retinal detachment; DM diabetes mellitus; DR diabetic retinopathy; NPDR non-proliferative diabetic retinopathy; PDR proliferative diabetic retinopathy; CSME clinically significant macular edema; DME diabetic macular edema; dbh dot blot hemorrhages; CWS cotton wool spot; POAG primary open angle glaucoma; C/D cup-to-disc ratio; HVF humphrey visual field; GVF goldmann visual field; OCT optical coherence tomography; IOP intraocular pressure; BRVO Branch retinal vein occlusion; CRVO central retinal vein occlusion; CRAO central retinal artery occlusion; BRAO branch retinal artery occlusion; RT retinal tear; SB scleral buckle; PPV pars plana vitrectomy; VH Vitreous hemorrhage; PRP  panretinal laser photocoagulation; IVK intravitreal kenalog; VMT vitreomacular traction; MH Macular hole;  NVD neovascularization of the disc; NVE neovascularization elsewhere; AREDS age related eye disease study; ARMD age related macular degeneration; POAG primary open angle glaucoma; EBMD epithelial/anterior basement membrane dystrophy; ACIOL anterior chamber intraocular lens; IOL intraocular lens; PCIOL posterior chamber intraocular lens; Phaco/IOL phacoemulsification with intraocular lens placement; [place] photorefractive keratectomy; LASIK laser assisted in situ keratomileusis; HTN hypertension; DM diabetes mellitus; COPD chronic obstructive pulmonary disease

## 2020-10-03 NOTE — Assessment & Plan Note (Signed)
Chronic subfoveal serous retinal detachment left eye, not responsive to antivegF Avastin or Eylea.  We will attempt to change to Lucentis OS today

## 2020-10-03 NOTE — Assessment & Plan Note (Signed)
Chronic subfoveal serous retinal detachment left eye

## 2020-10-03 NOTE — Assessment & Plan Note (Signed)
No signs of CNVM formation OD

## 2020-10-24 DIAGNOSIS — M4310 Spondylolisthesis, site unspecified: Secondary | ICD-10-CM | POA: Diagnosis not present

## 2020-10-24 DIAGNOSIS — M5416 Radiculopathy, lumbar region: Secondary | ICD-10-CM | POA: Diagnosis not present

## 2020-10-24 DIAGNOSIS — M8580 Other specified disorders of bone density and structure, unspecified site: Secondary | ICD-10-CM | POA: Diagnosis not present

## 2020-10-24 DIAGNOSIS — M47816 Spondylosis without myelopathy or radiculopathy, lumbar region: Secondary | ICD-10-CM | POA: Diagnosis not present

## 2020-10-26 ENCOUNTER — Other Ambulatory Visit: Payer: Self-pay | Admitting: Neurological Surgery

## 2020-10-26 DIAGNOSIS — M8580 Other specified disorders of bone density and structure, unspecified site: Secondary | ICD-10-CM

## 2020-10-27 ENCOUNTER — Other Ambulatory Visit: Payer: Self-pay | Admitting: Neurological Surgery

## 2020-10-27 DIAGNOSIS — M4310 Spondylolisthesis, site unspecified: Secondary | ICD-10-CM

## 2020-11-04 DIAGNOSIS — E785 Hyperlipidemia, unspecified: Secondary | ICD-10-CM | POA: Diagnosis not present

## 2020-11-04 DIAGNOSIS — Z1159 Encounter for screening for other viral diseases: Secondary | ICD-10-CM | POA: Diagnosis not present

## 2020-11-04 DIAGNOSIS — I10 Essential (primary) hypertension: Secondary | ICD-10-CM | POA: Diagnosis not present

## 2020-11-04 DIAGNOSIS — Z0000 Encounter for general adult medical examination without abnormal findings: Secondary | ICD-10-CM | POA: Diagnosis not present

## 2020-11-04 DIAGNOSIS — R209 Unspecified disturbances of skin sensation: Secondary | ICD-10-CM | POA: Diagnosis not present

## 2020-11-04 DIAGNOSIS — E1140 Type 2 diabetes mellitus with diabetic neuropathy, unspecified: Secondary | ICD-10-CM | POA: Diagnosis not present

## 2020-11-04 DIAGNOSIS — Z114 Encounter for screening for human immunodeficiency virus [HIV]: Secondary | ICD-10-CM | POA: Diagnosis not present

## 2020-11-07 ENCOUNTER — Encounter (INDEPENDENT_AMBULATORY_CARE_PROVIDER_SITE_OTHER): Payer: Self-pay | Admitting: Ophthalmology

## 2020-11-07 ENCOUNTER — Ambulatory Visit (INDEPENDENT_AMBULATORY_CARE_PROVIDER_SITE_OTHER): Payer: Medicare Other | Admitting: Ophthalmology

## 2020-11-07 ENCOUNTER — Other Ambulatory Visit: Payer: Self-pay

## 2020-11-07 DIAGNOSIS — H43812 Vitreous degeneration, left eye: Secondary | ICD-10-CM | POA: Diagnosis not present

## 2020-11-07 DIAGNOSIS — G4733 Obstructive sleep apnea (adult) (pediatric): Secondary | ICD-10-CM

## 2020-11-07 DIAGNOSIS — Z9989 Dependence on other enabling machines and devices: Secondary | ICD-10-CM | POA: Diagnosis not present

## 2020-11-07 DIAGNOSIS — H353221 Exudative age-related macular degeneration, left eye, with active choroidal neovascularization: Secondary | ICD-10-CM

## 2020-11-07 MED ORDER — RANIBIZUMAB 0.5 MG/0.05ML IZ SOSY
0.5000 mg | PREFILLED_SYRINGE | INTRAVITREAL | Status: AC | PRN
  Administered 2020-11-07: .5 mg via INTRAVITREAL

## 2020-11-07 NOTE — Progress Notes (Signed)
11/07/2020     CHIEF COMPLAINT Patient presents for  Chief Complaint  Patient presents with   Retina Follow Up    5 week fu OS and Avastin OS Pt states, "I am doing OK. I jsut had cat sx OD about 3 weeks ago. My distance vision is pretty good. I am still using the combination drop from the surgery." A1C:7.0 LBS: Unknown      HISTORY OF PRESENT ILLNESS: [name] is a 75 y.o. female who presents to the clinic today for:   HPI     Retina Follow Up           Diagnosis: Wet AMD   Laterality: left eye   Onset: 5 weeks ago   Severity: mild   Duration: 5 weeks   Course: stable   Comments: 5 week fu OS and Avastin OS Pt states, "I am doing OK. I jsut had cat sx OD about 3 weeks ago. My distance vision is pretty good. I am still using the combination drop from the surgery." A1C:7.0 LBS: Unknown         Comments   5 week fu os, oct lucentis os Patient states "it seems like there is more "goop" in my eye, like it clouds over- Dr. Katy Fitch said it is dry eyes. I use systane, I don't always use it." Denies any new floaters or FOL. Floaters stable ou, cataract surgery I had in March/ May improved the amount of floaters. Pt states she had bloodwork done on Friday, waiting on the results for the A1C. Glucometer is not working currently.      Last edited by Laurin Coder, COA on 11/07/2020  9:53 AM.      Referring physician: Wendie Agreste, MD [address] [place],   [id number]  HISTORICAL INFORMATION:   Selected notes from the MEDICAL RECORD NUMBER    Lab Results  Component Value Date   HGBA1C 7.0 (H) 04/14/2020     CURRENT MEDICATIONS: Current Outpatient Medications (Ophthalmic Drugs)  Medication Sig   Brimonidine Tartrate-Timolol (COMBIGAN OP) Apply 1 drop to eye 2 (two) times daily. In both eyes.   No current facility-administered medications for this visit. (Ophthalmic Drugs)   Current Outpatient Medications (Other)  Medication Sig    aspirin 81 MG tablet Take 81 mg by mouth daily.   benzonatate (TESSALON) 100 MG capsule Take 1-2 capsules (100-200 mg total) by mouth 3 (three) times daily as needed for cough.   cholecalciferol (VITAMIN D) 1000 units tablet Take 1,000 Units by mouth daily.   Cyanocobalamin (VITAMIN B-12 CR PO) Take 500 mcg by mouth daily.   doxycycline (VIBRA-TABS) 100 MG tablet Take 1 tablet (100 mg total) by mouth 2 (two) times daily.   ezetimibe (ZETIA) 10 MG tablet Take 1 tablet (10 mg total) by mouth daily after supper.   fluticasone (FLONASE) 50 MCG/ACT nasal spray Place 1 spray into both nostrils daily.   fluticasone (FLOVENT HFA) 44 MCG/ACT inhaler Inhale 2 puffs into the lungs 2 (two) times daily. With spacer.   glucose blood test strip Use as instructed   loratadine (CLARITIN) 10 MG tablet Take 1 tablet (10 mg total) by mouth daily.   metFORMIN (GLUCOPHAGE) 500 MG tablet Take 1 tablet (500 mg total) by mouth daily with breakfast.   metoprolol succinate (TOPROL-XL) 25 MG 24 hr tablet Take 1 tablet (25 mg total) by mouth daily.   Turmeric Curcumin 500 MG CAPS Take by mouth.  vitamin C (ASCORBIC ACID) 500 MG tablet Take 500 mg by mouth 2 (two) times daily.    Zinc 50 MG TABS    Current Facility-Administered Medications (Other)  Medication Route   EPINEPHrine (Anaphylaxis) SOLN 0.3 mg Intramuscular      REVIEW OF SYSTEMS:    ALLERGIES Allergies  Allergen Reactions   Peanut (Diagnostic) Cough, Hypertension and Shortness Of Breath   Adhesive [Tape] Rash   Crestor [Rosuvastatin] Other (See Comments)    Severe arthralgia and myalgia   Latex Itching and Other (See Comments)    Causes blisters   Lovastatin Other (See Comments)    Arthralgias, hyperglycemia with lovastatin.    PAST MEDICAL HISTORY Past Medical History:  Diagnosis Date   Allergic rhinitis 03/23/2013   Allergy    Anemia    due to vaginal bleeding   Arthritis    hands   Asthmatic bronchitis 09/03/2017   Breast cancer  ([facility]) 08/2012   left   Dental crowns present    Eczema    Fibroids    Headache(784.0)    tension   History of endometriosis    Hordeolum externum right upper eyelid 07/25/2020   New finding OD today not noticed by the patient prior   Hyperlipidemia    Hypertension    under control with med., has been on med. x 3 yr.   Nuclear sclerotic cataract of right eye 12/02/2019   OSA (obstructive sleep apnea)    Postmenopausal bleeding    Recurrent upper respiratory infection (URI)    S/P radiation therapy 10/20/2012-12/05/2012   left breast 50.4 gray, lumpectomy cavity boosted to 62.4 gray   Seasonal allergies    Sleep apnea    uses CPAP nightly   Urinary urgency    White coat hypertension    Past Surgical History:  Procedure Laterality Date   BACK SURGERY  1997   lumbar   BREAST LUMPECTOMY WITH NEEDLE LOCALIZATION AND AXILLARY SENTINEL LYMPH NODE BX Left 09/03/2012   Procedure: BREAST LUMPECTOMY WITH NEEDLE LOCALIZATION AND AXILLARY SENTINEL LYMPH NODE BX;  Surgeon: Edward Jolly, MD;  Location: [facility];  Service: General;  Laterality: Left;   CHOLECYSTECTOMY  1995   COLONOSCOPY W/ POLYPECTOMY  03/20/2007   DILATION AND CURETTAGE OF UTERUS  2005   DILATION AND CURETTAGE OF UTERUS  06/22/2011   Procedure: DILATATION AND CURETTAGE;  Surgeon: Alvino Chapel, MD;  Location: [facility];  Service: Gynecology;  Laterality: N/A;  OK PER KEELA FOR 7:15 START   DORSAL COMPARTMENT RELEASE Right 05/19/2013   Procedure: RELEASE 1ST  DORSAL COMPARTMENT RIGHT (DEQUERVAIN);  Surgeon: Cammie Sickle., MD;  Location: [facility];  Service: Orthopedics;  Laterality: Right;   EXPLORATORY LAPAROTOMY  06-30-2007   ATTEMPTED HYSTERECTOMY ABORTED DUE TO EXTENSIVE ENDOMETRIOSIS W/ DENSE PELVIC ADHESIONS INVOLVING UTERUS AND LOWER RECTOSIGMOID   HYSTEROSCOPY WITH D & C  05/06/2009    FAMILY HISTORY Family History  Problem Relation Age of Onset   Vision  loss Mother    Hypertension Mother    Anesthesia problems Mother        post-op N/V   Heart disease Mother        carotid stenosis s/p CAE   Osteoporosis Mother    Eczema Mother    Depression Father    Heart disease Sister 17       arrhythmia   Hypertension Sister    Hyperlipidemia Sister    Diabetes Brother  Hypertension Brother    Cancer Brother        skin   Hyperlipidemia Brother    Stroke Brother    COPD Daughter    Atopy Daughter    Cancer Cousin    Kidney failure Maternal Grandmother    Heart disease Maternal Grandfather    Hypertension Other    Colon cancer Neg Hx    Allergic rhinitis Neg Hx    Asthma Neg Hx    Urticaria Neg Hx     SOCIAL HISTORY Social History   Tobacco Use   Smoking status: Never   Smokeless tobacco: Never  Vaping Use   Vaping Use: Never used  Substance Use Topics   Alcohol use: No    Alcohol/week: 0.0 standard drinks   Drug use: No         OPHTHALMIC EXAM:  Base Eye Exam     Visual Acuity (Snellen - Linear)       Right Left   Dist [place] 20/20 20/20 -2         Tonometry (Tonopen, 9:58 AM)       Right Left   Pressure 14 13         Pupils       Pupils Dark Light Shape React APD   Right PERRL 4 3 Round Slow None   Left PERRL 4 3 Round Slow None         Visual Fields (Counting fingers)       Left Right    Full Full         Extraocular Movement       Right Left    Full Full         Neuro/Psych     Oriented x3: Yes   Mood/Affect: Normal         Dilation     Left eye: 1.0% Mydriacyl, 2.5% Phenylephrine @ 9:58 AM           Slit Lamp and Fundus Exam     External Exam       Right Left   External Normal Normal         Slit Lamp Exam       Right Left   Lids/Lashes Normal Normal   Conjunctiva/Sclera White and quiet White and quiet   Cornea Clear Clear   Anterior Chamber Deep and quiet Deep and quiet   Iris Round and reactive Round and reactive   Lens 2+ Nuclear sclerosis 2+  Nuclear sclerosis   Anterior Vitreous Normal Normal         Fundus Exam       Right Left   Posterior Vitreous  Posterior vitreous detachment   Disc  Peripapillary atrophy   C/D Ratio  0.5   Macula  Retinal pigment epithelial mottling, no exudates, no hemorrhage,  macular thickening, Early age related macular degeneration, Pigmented atrophy, nonturbid subretinal fluid   Vessels  Normal, , no DR   Periphery  Normal            IMAGING AND PROCEDURES  Imaging and Procedures for 11/07/20  OCT, Retina - OU - Both Eyes       Right Eye Quality was good. Scan locations included subfoveal. Central Foveal Thickness: 249. Progression has been stable. Findings include retinal drusen , no IRF, no SRF.   Left Eye Quality was good. Scan locations included subfoveal. Central Foveal Thickness: 264. Progression has improved. Findings include subretinal fluid, pigment epithelial detachment, no IRF.  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  Repeat injection today     Intravitreal Injection, Pharmacologic Agent - OS - Left Eye       Time Out 11/07/2020. 10:53 AM. Confirmed correct patient, procedure, site, and patient consented.   Anesthesia Topical anesthesia was used. Anesthetic medications included Akten 3.5%.   Procedure Preparation included Tobramycin 0.3%, 5% betadine to ocular surface. A 30 gauge needle was used.   Injection: 0.5 mg Ranibizumab 0.5 MG/0.05ML   Route: Intravitreal, Site: Left Eye   NDC: [id number], Lot: [id number], Waste: 0 mL   Post-op Post injection exam found visual acuity of at least counting fingers. The patient tolerated the procedure well. There were no complications. The patient received written and verbal post procedure care education.              ASSESSMENT/PLAN:  Posterior vitreous detachment of left eye   The nature of posterior vitreous detachment was discussed with the patient as well as its physiology, its age prevalence, and its possible implication regarding retinal breaks and detachment.  An informational brochure was offered to the patient.  All the patient's questions were answered.  The patient was asked to return if new or different flashes or floaters develops.   Patient was instructed to contact office immediately if any new changes were noticed. I explained to the patient that vitreous inside the eye is similar to jello inside a bowl. As the jello melts it can start to pull away from the bowl, similarly the vitreous throughout our lives can begin to pull away from the retina. That process is called a posterior vitreous detachment. In some cases, the vitreous can tug hard enough on the retina to form a retinal tear. I discussed with the patient the signs and symptoms of a retinal detachment.  Do not rub the eye.    Exudative age-related macular degeneration of left eye with active choroidal neovascularization (HCC) Small serous subfoveal Ernie Avena associated with subfoveal PED.  Stabilized with good acuity on Lucentis now at 6-week follow-up with no increase in size we will repeat injection today and examination again in 6 to 7 weeks  Obstructive sleep apnea on CPAP Patient reports excellent compliance, this prevents nightly hypoxic stress to the macula     ICD-10-CM   1. Exudative age-related macular degeneration of left eye with active choroidal neovascularization (HCC)  H35.3221 OCT, Retina - OU - Both Eyes    Intravitreal Injection, Pharmacologic Agent - OS - Left Eye    Ranibizumab SOSY 0.5 mg    2. Posterior vitreous detachment of left eye  H43.812     3. Obstructive sleep apnea on CPAP  G47.33    Z99.89       1.  OU with mild [facility] at [facility] with subfoveal CNVM and serous retinal detachment associated with pigment epithelial detachment.  Stabilized and no increase in size currently  at 6-week follow-up interval on Lucentis with resistance to other medications.  We will repeat injection today and examination again in 6 weeks  2.  3.  Ophthalmic Meds Ordered this visit:  Meds ordered this encounter  Medications   Ranibizumab SOSY 0.5 mg       Return in about 6 weeks (around 12/19/2020) for dilate, OS, LUCENTIS 0.5 OCT.  There are no Patient Instructions on file for this visit.   Explained the diagnoses, plan, and follow up with the patient  and they expressed understanding.  Patient expressed understanding of the importance of proper follow up care.   Clent Demark Eira Alpert M.D. Diseases & Surgery of the Retina and Vitreous [facility] 11/07/20     Abbreviations: M myopia (nearsighted); A astigmatism; H hyperopia (farsighted); P presbyopia; Mrx spectacle prescription;  CTL contact lenses; OD right eye; OS left eye; OU both eyes  XT exotropia; ET esotropia; PEK punctate epithelial keratitis; PEE punctate epithelial erosions; DES dry eye syndrome; MGD meibomian gland dysfunction; ATs artificial tears; PFAT's preservative free artificial tears; [place] nuclear sclerotic cataract; PSC posterior subcapsular cataract; ERM epi-retinal membrane; PVD posterior vitreous detachment; RD retinal detachment; DM diabetes mellitus; DR diabetic retinopathy; NPDR non-proliferative diabetic retinopathy; PDR proliferative diabetic retinopathy; CSME clinically significant macular edema; DME diabetic macular edema; dbh dot blot hemorrhages; CWS cotton wool spot; POAG primary open angle glaucoma; C/D cup-to-disc ratio; HVF humphrey visual field; GVF goldmann visual field; OCT optical coherence tomography; IOP intraocular pressure; BRVO Branch retinal vein occlusion; CRVO central retinal vein occlusion; CRAO central retinal artery occlusion; BRAO branch retinal artery occlusion; RT retinal tear; SB scleral buckle; PPV pars plana vitrectomy; VH Vitreous hemorrhage; PRP panretinal laser  photocoagulation; IVK intravitreal kenalog; VMT vitreomacular traction; MH Macular hole;  NVD neovascularization of the disc; NVE neovascularization elsewhere; AREDS age related eye disease study; ARMD age related macular degeneration; POAG primary open angle glaucoma; EBMD epithelial/anterior basement membrane dystrophy; ACIOL anterior chamber intraocular lens; IOL intraocular lens; PCIOL posterior chamber intraocular lens; Phaco/IOL phacoemulsification with intraocular lens placement; [place] photorefractive keratectomy; LASIK laser assisted in situ keratomileusis; HTN hypertension; DM diabetes mellitus; COPD chronic obstructive pulmonary disease

## 2020-11-07 NOTE — Assessment & Plan Note (Signed)
Patient reports excellent compliance, this prevents nightly hypoxic stress to the macula

## 2020-11-07 NOTE — Assessment & Plan Note (Signed)

## 2020-11-07 NOTE — Assessment & Plan Note (Signed)
Small serous subfoveal Ernie Avena associated with subfoveal PED.  Stabilized with good acuity on Lucentis now at 6-week follow-up with no increase in size we will repeat injection today and examination again in 6 to 7 weeks

## 2020-11-14 ENCOUNTER — Ambulatory Visit: Payer: Medicare Other | Admitting: Cardiology

## 2020-11-15 ENCOUNTER — Other Ambulatory Visit: Payer: Self-pay

## 2020-11-15 ENCOUNTER — Encounter: Payer: Self-pay | Admitting: Cardiology

## 2020-11-15 ENCOUNTER — Ambulatory Visit
Admission: RE | Admit: 2020-11-15 | Discharge: 2020-11-15 | Disposition: A | Discharge status: Home / Self Care | Payer: Medicare Other | Source: Ambulatory Visit | Attending: Neurological Surgery | Admitting: Neurological Surgery

## 2020-11-15 ENCOUNTER — Ambulatory Visit: Payer: Medicare Other | Admitting: Cardiology

## 2020-11-15 VITALS — BP 140/63 | HR 85 | Temp 98.4°F | Resp 17 | Ht 61.0 in | Wt 220.4 lb

## 2020-11-15 DIAGNOSIS — I10 Essential (primary) hypertension: Secondary | ICD-10-CM

## 2020-11-15 DIAGNOSIS — G9519 Other vascular myelopathies: Secondary | ICD-10-CM | POA: Diagnosis not present

## 2020-11-15 DIAGNOSIS — E782 Mixed hyperlipidemia: Secondary | ICD-10-CM

## 2020-11-15 DIAGNOSIS — M48061 Spinal stenosis, lumbar region without neurogenic claudication: Secondary | ICD-10-CM | POA: Diagnosis not present

## 2020-11-15 DIAGNOSIS — M4310 Spondylolisthesis, site unspecified: Secondary | ICD-10-CM

## 2020-11-15 DIAGNOSIS — M545 Low back pain, unspecified: Secondary | ICD-10-CM | POA: Diagnosis not present

## 2020-11-15 DIAGNOSIS — I471 Supraventricular tachycardia: Secondary | ICD-10-CM

## 2020-11-15 MED ORDER — SIMVASTATIN 20 MG PO TABS: 20.0000 mg | ORAL_TABLET | Freq: Every day | ORAL | 2 refills | Status: DC

## 2020-11-15 NOTE — Progress Notes (Signed)
Primary Physician/Referring:  Ferd Hibbs, NP   Patient ID: [name], female    DOB: 1945/04/06, 75 y.o.   MRN: [id number]  Chief Complaint  Patient presents with   Follow-up    1 year   Hyperlipidemia   Claudication   HPI:    [name]  is a 75 y.o. female  with PSVT, hypertension, type 2 diabetes, hyperlipidemia, morbid obesity, OSA on CPAP, right leg claudication, presents here for follow-up of PAD and also hyperlipidemia.  Her main complaint today is severe back pain, she has not been diagnosed with spinal stenosis and also severe degenerative spine disease and they are contemplating surgery.  Her activity has been limited due to this.  Denies chest pain or palpitations.  Denies any dyspnea, PND or orthopnea.  Past Medical History:  Diagnosis Date   Allergic rhinitis 03/23/2013   Allergy    Anemia    due to vaginal bleeding   Arthritis    hands   Asthmatic bronchitis 09/03/2017   Breast cancer ([facility]) 08/2012   left   Dental crowns present    Eczema    Fibroids    Headache(784.0)    tension   History of endometriosis    Hordeolum externum right upper eyelid 07/25/2020   New finding OD today not noticed by the patient prior   Hyperlipidemia    Hypertension    under control with med., has been on med. x 3 yr.   Nuclear sclerotic cataract of right eye 12/02/2019   OSA (obstructive sleep apnea)    Postmenopausal bleeding    Recurrent upper respiratory infection (URI)    S/P radiation therapy 10/20/2012-12/05/2012   left breast 50.4 gray, lumpectomy cavity boosted to 62.4 gray   Seasonal allergies    Sleep apnea    uses CPAP nightly   Urinary urgency    White coat hypertension    Past Surgical History:  Procedure Laterality Date   BACK SURGERY  1997   lumbar   BREAST LUMPECTOMY WITH NEEDLE LOCALIZATION AND AXILLARY SENTINEL LYMPH NODE BX Left 09/03/2012   Procedure: BREAST LUMPECTOMY WITH NEEDLE LOCALIZATION AND AXILLARY SENTINEL LYMPH NODE BX;  Surgeon:  Edward Jolly, MD;  Location: [facility];  Service: General;  Laterality: Left;   CHOLECYSTECTOMY  1995   COLONOSCOPY W/ POLYPECTOMY  03/20/2007   DILATION AND CURETTAGE OF UTERUS  2005   DILATION AND CURETTAGE OF UTERUS  06/22/2011   Procedure: DILATATION AND CURETTAGE;  Surgeon: Alvino Chapel, MD;  Location: [facility];  Service: Gynecology;  Laterality: N/A;  OK PER KEELA FOR 7:15 START   DORSAL COMPARTMENT RELEASE Right 05/19/2013   Procedure: RELEASE 1ST  DORSAL COMPARTMENT RIGHT (DEQUERVAIN);  Surgeon: Cammie Sickle., MD;  Location: [facility];  Service: Orthopedics;  Laterality: Right;   EXPLORATORY LAPAROTOMY  06-30-2007   ATTEMPTED HYSTERECTOMY ABORTED DUE TO EXTENSIVE ENDOMETRIOSIS W/ DENSE PELVIC ADHESIONS INVOLVING UTERUS AND LOWER RECTOSIGMOID   HYSTEROSCOPY WITH D & C  05/06/2009   Family History  Problem Relation Age of Onset   Vision loss Mother    Hypertension Mother    Anesthesia problems Mother        post-op N/V   Heart disease Mother        carotid stenosis s/p CAE   Osteoporosis Mother    Eczema Mother    Depression Father    Heart disease Sister 74       arrhythmia  Hypertension Sister    Hyperlipidemia Sister    Diabetes Brother    Hypertension Brother    Cancer Brother        skin   Hyperlipidemia Brother    Stroke Brother    Kidney failure Maternal Grandmother    Heart disease Maternal Grandfather    COPD Daughter    Atopy Daughter    Cancer Cousin    Hypertension Other    Colon cancer Neg Hx    Allergic rhinitis Neg Hx    Asthma Neg Hx    Urticaria Neg Hx     Social History   Tobacco Use   Smoking status: Never   Smokeless tobacco: Never  Substance Use Topics   Alcohol use: No    Alcohol/week: 0.0 standard drinks   Marital Status: Married   ROS  Review of Systems  Cardiovascular:  Positive for claudication. Negative for dyspnea on exertion, leg swelling and syncope.   Respiratory:  Negative for shortness of breath.   Musculoskeletal:  Positive for arthritis and back pain. Negative for joint swelling.  Objective  Blood pressure 140/63, pulse 85, temperature 98.4 F (36.9 C), temperature source Temporal, resp. rate 17, height '5\' 1"'$  (1.549 m), weight 220 lb 6.4 oz (100 kg), SpO2 95 %.  Vitals with BMI 11/15/2020 11/15/2020 05/19/2020  Height - '5\' 1"'$  '5\' 1"'$   Weight - 220 lbs 6 oz 213 lbs  BMI - [id number]  Systolic [id number]  Diastolic 63 70 78  Pulse 85 86 85     Physical Exam Constitutional:      General: She is not in acute distress.    Appearance: She is well-developed. She is obese.  Neck:     Vascular: No carotid bruit or JVD.  Cardiovascular:     Rate and Rhythm: Normal rate and regular rhythm.     Pulses: Intact distal pulses.          Femoral pulses are 2+ on the right side and 2+ on the left side.      Popliteal pulses are 2+ on the right side and 2+ on the left side.       Dorsalis pedis pulses are 2+ on the right side and 2+ on the left side.       Posterior tibial pulses are 0 on the right side and 0 on the left side.     Heart sounds: No murmur heard.   No gallop.  Pulmonary:     Effort: Pulmonary effort is normal. No accessory muscle usage or respiratory distress.     Breath sounds: Normal breath sounds.  Abdominal:     Palpations: Abdomen is soft.  Musculoskeletal:     Right lower leg: Edema (2+ ankle edema) present.     Left lower leg: Edema (2+ ankle edema) present.   Laboratory examination:   Recent Labs    01/13/20 1121 04/14/20 1105  NA 137 138  K 4.5 4.5  CL 96 98  CO2 27 25  GLUCOSE 157* 165*  BUN 23 17  CREATININE 0.80 0.78  CALCIUM 10.0 9.9  GFRNONAA 72 75  GFRAA 83 86   CrCl cannot be calculated (Patient's most recent lab result is older than the maximum 21 days allowed.).  CMP Latest Ref Rng & Units 04/14/2020 01/13/2020 07/01/2019  Glucose 65 - 99 mg/dL 165(H) 157(H) 180(H)  BUN 8 - 27 mg/dL '17 23 12   '$ Creatinine 0.57 - 1.00 mg/dL 0.78 0.80 0.81  Sodium 134 -  144 mmol/L 138 137 137  Potassium 3.5 - 5.2 mmol/L 4.5 4.5 4.7  Chloride 96 - 106 mmol/L 98 96 99  CO2 20 - 29 mmol/L '25 27 25  '$ Calcium 8.7 - 10.3 mg/dL 9.9 10.0 9.3  Total Protein 6.0 - 8.5 g/dL 7.0 6.8 6.7  Total Bilirubin 0.0 - 1.2 mg/dL 0.6 0.5 0.5  Alkaline Phos 44 - 121 IU/L 93 91 100  AST 0 - 40 IU/L '26 19 26  '$ ALT 0 - 32 IU/L '16 14 15   '$ CBC Latest Ref Rng & Units 07/29/2017 04/15/2017 12/11/2016  WBC 3.4 - 10.8 x10E3/uL 5.5 5.6 6.4  Hemoglobin 11.1 - 15.9 g/dL 13.0 12.4 12.8  Hematocrit 34.0 - 46.6 % 38.1 38.7 38.6  Platelets 150 - 379 x10E3/uL 228 234 263   Lipid Panel Recent Labs    04/14/20 1105  CHOL 228*  TRIG 157*  LDLCALC 157*  HDL 42  CHOLHDL 5.4*    HEMOGLOBIN A1C Lab Results  Component Value Date   HGBA1C 7.0 (H) 04/14/2020   MPG 120 12/22/2015   TSH Recent Labs    04/14/20 1105  TSH 2.010    External labs:   TSH 1.570 12/11/2016  Medications and allergies   Allergies  Allergen Reactions   Peanut (Diagnostic) Cough, Hypertension and Shortness Of Breath   Adhesive [Tape] Rash   Atorvastatin Other (See Comments)    Myalgia   Crestor [Rosuvastatin] Other (See Comments)    Severe arthralgia and myalgia   Latex Itching and Other (See Comments)    Causes blisters   Lovastatin Other (See Comments)    Arthralgias, hyperglycemia with lovastatin.     Current Outpatient Medications  Medication Instructions   aspirin 81 mg, Daily   benzonatate (TESSALON) 100-200 mg, Oral, 3 times daily PRN   Brimonidine Tartrate-Timolol (COMBIGAN OP) 1 drop, Ophthalmic, 2 times daily, In both eyes.   cholecalciferol (VITAMIN D) 1,000 Units, Oral, Daily   ezetimibe (ZETIA) 10 mg, Oral, Daily after supper   fluticasone (FLONASE) 50 MCG/ACT nasal spray 1 spray, Each Nare, Daily   fluticasone (FLOVENT HFA) 44 MCG/ACT inhaler 2 puffs, Inhalation, 2 times daily, With spacer.   glucose blood test strip Use as  instructed   loratadine (CLARITIN) 10 mg, Oral, Daily   metFORMIN (GLUCOPHAGE) 500 mg, Oral, Daily with breakfast   metoprolol succinate (TOPROL-XL) 25 mg, Oral, Daily   simvastatin (ZOCOR) 20 mg, Oral, Daily at bedtime   Turmeric Curcumin 500 MG CAPS Oral   vitamin C (ASCORBIC ACID) 500 mg, Oral, 2 times daily   Zinc 50 MG TABS No dose, route, or frequency recorded.   Radiology:   No results found.  Cardiac Studies:   Event monitor 09/27/15-2015/11/22: Sinus rhythm, heart rate ranging between 59-138/m. PACs. One run of PSVT seen on 09/29/2015 at 2.26 AM at average heart rate-150/m.  Lexiscan myoview stress test 09/09/2017: 1. Lexiscan stress test was performed. Exercise capacity was not assessed. Stress symptoms included dyspnea, dizziness. Peak blood pressure was 158/56 mmHg. The stress electrocardiogram showed sinus tachycardia, normal stress conduction, no stress arrhythmias and normal stress repolarization. Stress EKG is non diagnostic for ischemia as it is a pharmacologic stress. 2. The overall quality of the study is excellent. There is no evidence of abnormal lung activity. Stress and rest SPECT images demonstrate homogeneous tracer distribution throughout the myocardium. Gated SPECT imaging reveals normal myocardial thickening and wall motion. The left ventricular ejection fraction was normal (76%). 3. Low risk study.   Echocardiogram  09/17/2017: Left ventricle cavity is normal in size. Mild concentric hypertrophy of the left ventricle. Normal global wall motion. Doppler evidence of grade I (impaired) diastolic dysfunction, normal LAP. Calculated EF 56%. Right ventricle cavity is mildly dilated. Normal right ventricular function. Mild tricuspid regurgitation. Mild pulmonary hypertension. Estimated pulmonary artery systolic pressure 32 mmHg IVC is dilated with respiratory variation. This may suggest elevated right heart pressure. Compared to the store on 11/23/25, mild RV dilatation and mild  pulmonary hypertension new.  Lower Extremity Arterial Duplex 04/23/2019:  No hemodynamically significant stenoses are identified in the bilateral lower extremity arterial system. Severely abnormal waveform at the right PT suggests severe diffuse disease.  This exam reveals normal perfusion of the right and left lower extremity (ABI 1.46). ABI may be falsely elevated in patients with DM.  EKG  EKG 11/15/2020: Normal sinus rhythm at the rate of 85 beats minute, left atrial enlargement, normal axis.  Incomplete right bundle branch block.  Poor R progression, cannot exclude anteroseptal infarct old.  Low-voltage complexes.  Assessment     ICD-10-CM   1. Primary hypertension  I10 EKG 12-Lead    2. Mixed hyperlipidemia  E78.2 simvastatin (ZOCOR) 20 MG tablet    3. Morbid obesity ([place])  E66.01     4. PSVT (paroxysmal supraventricular tachycardia) (HCC)  I47.1     5. Neurogenic claudication ([facility])  G95.19        Meds ordered this encounter  Medications   simvastatin (ZOCOR) 20 MG tablet    Sig: Take 1 tablet (20 mg total) by mouth at bedtime.    Dispense:  30 tablet    Refill:  2    Medications Discontinued During This Encounter  Medication Reason   doxycycline (VIBRA-TABS) 100 MG tablet Error   Cyanocobalamin (VITAMIN B-12 CR PO) Error    Recommendations:   [name]  is a 75 y.o.  female  with PSVT, hypertension, type 2 diabetes, hyperlipidemia, morbid obesity, OSA on CPAP, right leg claudication, presents here for follow-up of PAD and also hyperlipidemia.  She lost her husband from COVID-9 infection recently about 6 months ago.  Her main complaint today is severe back pain and she has been told to have severe spinal stenosis and also degenerative spine disease and may need major surgical intervention.  On exam, I do feel 2+ bilateral pedal pulses, posterior tibial pulses are absent, but otherwise I am able to feel her popliteal pulse and femoral pulse hence PAD is probably  very small competent of her symptoms and suspect neurogenic claudication.  I discussed with her that from cardiac standpoint she is low risk for having back surgery, she can certainly hold aspirin for a week prior to surgery but more importantly she would be at extreme high risk from periprocedural complications of noncardiac etiology including infection in view of morbid obesity.  Weight loss was discussed extensively.  With regard to palpitations, she has not had any further episodes in many years.  She remains asymptomatic.  Blood pressure is well controlled.  She has multiple statin intolerance due to myalgias, she is willing to try simvastatin along with continued Zetia that she is on presently.  She can follow-up with her PCP regarding this, if she tolerates simvastatin continue the same otherwise we could even change it to every other day along with Zetia on a daily basis.  Otherwise stable from cardiac standpoint and in the absence of recurrence of PSVT like symptoms, I will see her back on a as  needed basis.  She is cleared from cardiac standpoint to have back surgery and aspirin to be held for 7 days.  I will send this note to her PCP.    Adrian Prows, MD, [facility] 11/15/2020, 3:20 PM Office: [phone]

## 2020-12-14 ENCOUNTER — Other Ambulatory Visit: Payer: Self-pay | Admitting: Cardiology

## 2020-12-14 DIAGNOSIS — E782 Mixed hyperlipidemia: Secondary | ICD-10-CM

## 2020-12-15 DIAGNOSIS — Z23 Encounter for immunization: Secondary | ICD-10-CM | POA: Diagnosis not present

## 2020-12-26 ENCOUNTER — Other Ambulatory Visit: Payer: Self-pay

## 2020-12-26 ENCOUNTER — Ambulatory Visit (INDEPENDENT_AMBULATORY_CARE_PROVIDER_SITE_OTHER): Payer: Medicare Other | Admitting: Ophthalmology

## 2020-12-26 ENCOUNTER — Encounter (INDEPENDENT_AMBULATORY_CARE_PROVIDER_SITE_OTHER): Payer: Self-pay | Admitting: Ophthalmology

## 2020-12-26 DIAGNOSIS — H35722 Serous detachment of retinal pigment epithelium, left eye: Secondary | ICD-10-CM

## 2020-12-26 DIAGNOSIS — H43822 Vitreomacular adhesion, left eye: Secondary | ICD-10-CM | POA: Diagnosis not present

## 2020-12-26 DIAGNOSIS — H353132 Nonexudative age-related macular degeneration, bilateral, intermediate dry stage: Secondary | ICD-10-CM | POA: Diagnosis not present

## 2020-12-26 DIAGNOSIS — H353221 Exudative age-related macular degeneration, left eye, with active choroidal neovascularization: Secondary | ICD-10-CM

## 2020-12-26 MED ORDER — RANIBIZUMAB 0.5 MG/0.05ML IZ SOSY
0.5000 mg | PREFILLED_SYRINGE | INTRAVITREAL | Status: AC | PRN
  Administered 2020-12-26: .5 mg via INTRAVITREAL

## 2020-12-26 NOTE — Assessment & Plan Note (Signed)
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  Stabilized on intravitreal Lucentis and no progression at 6-week interval yet still active.  We will repeat injection today and examination again in 6 weeks left eye

## 2020-12-26 NOTE — Assessment & Plan Note (Signed)
See vertical stranding as of 12-26-2020 centrally OS

## 2020-12-26 NOTE — Assessment & Plan Note (Signed)
OD no signs of CNVM by OCT

## 2020-12-26 NOTE — Assessment & Plan Note (Signed)
See comments under wet AMD left eye

## 2020-12-26 NOTE — Progress Notes (Signed)
12/26/2020     CHIEF COMPLAINT Patient presents for  Chief Complaint  Patient presents with   Retina Follow Up      HISTORY OF PRESENT ILLNESS: [name] is a 75 y.o. female who presents to the clinic today for:   HPI     Retina Follow Up   Patient presents with  Wet AMD.  In left eye.  This started 7 weeks ago.  Severity is mild.  Duration of 7 weeks.  Since onset it is stable.        Comments   7 week fu OS and Lucentis OS and OCT Pt states VA OU stable since last visit. Pt denies FOL, floaters, or ocular pain OU.  A1C:7.9 LBS:153 Pt reports using Combigan BID OU        Last edited by Kendra Opitz, COA on 12/26/2020  9:50 AM.      Referring physician: Wendie Agreste, MD [address] [place],  [place] [id number]  HISTORICAL INFORMATION:   Selected notes from the MEDICAL RECORD NUMBER    Lab Results  Component Value Date   HGBA1C 7.0 (H) 04/14/2020     CURRENT MEDICATIONS: Current Outpatient Medications (Ophthalmic Drugs)  Medication Sig   Brimonidine Tartrate-Timolol (COMBIGAN OP) Apply 1 drop to eye 2 (two) times daily. In both eyes.   No current facility-administered medications for this visit. (Ophthalmic Drugs)   Current Outpatient Medications (Other)  Medication Sig   aspirin 81 MG tablet Take 81 mg by mouth daily.   benzonatate (TESSALON) 100 MG capsule Take 1-2 capsules (100-200 mg total) by mouth 3 (three) times daily as needed for cough.   cholecalciferol (VITAMIN D) 1000 units tablet Take 1,000 Units by mouth daily.   ezetimibe (ZETIA) 10 MG tablet Take 1 tablet (10 mg total) by mouth daily after supper.   fluticasone (FLONASE) 50 MCG/ACT nasal spray Place 1 spray into both nostrils daily.   fluticasone (FLOVENT HFA) 44 MCG/ACT inhaler Inhale 2 puffs into the lungs 2 (two) times daily. With spacer.   glucose blood test strip Use as instructed   loratadine (CLARITIN) 10 MG tablet Take 1 tablet (10 mg total) by mouth daily.    metFORMIN (GLUCOPHAGE) 500 MG tablet Take 1 tablet (500 mg total) by mouth daily with breakfast.   metoprolol succinate (TOPROL-XL) 25 MG 24 hr tablet Take 1 tablet by mouth once daily   simvastatin (ZOCOR) 20 MG tablet TAKE 1 TABLET BY MOUTH AT BEDTIME   Turmeric Curcumin 500 MG CAPS Take by mouth.   vitamin C (ASCORBIC ACID) 500 MG tablet Take 500 mg by mouth 2 (two) times daily.    Zinc 50 MG TABS    Current Facility-Administered Medications (Other)  Medication Route   EPINEPHrine (Anaphylaxis) SOLN 0.3 mg Intramuscular      REVIEW OF SYSTEMS:    ALLERGIES Allergies  Allergen Reactions   Peanut (Diagnostic) Cough, Hypertension and Shortness Of Breath   Adhesive [Tape] Rash   Atorvastatin Other (See Comments)    Myalgia   Crestor [Rosuvastatin] Other (See Comments)    Severe arthralgia and myalgia   Latex Itching and Other (See Comments)    Causes blisters   Lovastatin Other (See Comments)    Arthralgias, hyperglycemia with lovastatin.    PAST MEDICAL HISTORY Past Medical History:  Diagnosis Date   Allergic rhinitis 03/23/2013   Allergy    Anemia    due to vaginal bleeding   Arthritis  hands   Asthmatic bronchitis 09/03/2017   Breast cancer ([facility]) 08/2012   left   Dental crowns present    Eczema    Fibroids    Headache(784.0)    tension   History of endometriosis    Hordeolum externum right upper eyelid 07/25/2020   New finding OD today not noticed by the patient prior   Hyperlipidemia    Hypertension    under control with med., has been on med. x 3 yr.   Nuclear sclerotic cataract of right eye 12/02/2019   OSA (obstructive sleep apnea)    Postmenopausal bleeding    Recurrent upper respiratory infection (URI)    S/P radiation therapy 10/20/2012-12/05/2012   left breast 50.4 gray, lumpectomy cavity boosted to 62.4 gray   Seasonal allergies    Sleep apnea    uses CPAP nightly   Urinary urgency    White coat hypertension    Past Surgical History:   Procedure Laterality Date   BACK SURGERY  1997   lumbar   BREAST LUMPECTOMY WITH NEEDLE LOCALIZATION AND AXILLARY SENTINEL LYMPH NODE BX Left 09/03/2012   Procedure: BREAST LUMPECTOMY WITH NEEDLE LOCALIZATION AND AXILLARY SENTINEL LYMPH NODE BX;  Surgeon: Edward Jolly, MD;  Location: [facility];  Service: General;  Laterality: Left;   CHOLECYSTECTOMY  1995   COLONOSCOPY W/ POLYPECTOMY  03/20/2007   DILATION AND CURETTAGE OF UTERUS  2005   DILATION AND CURETTAGE OF UTERUS  06/22/2011   Procedure: DILATATION AND CURETTAGE;  Surgeon: Alvino Chapel, MD;  Location: [facility];  Service: Gynecology;  Laterality: N/A;  OK PER KEELA FOR 7:15 START   DORSAL COMPARTMENT RELEASE Right 05/19/2013   Procedure: RELEASE 1ST  DORSAL COMPARTMENT RIGHT (DEQUERVAIN);  Surgeon: Cammie Sickle., MD;  Location: [facility];  Service: Orthopedics;  Laterality: Right;   EXPLORATORY LAPAROTOMY  06-30-2007   ATTEMPTED HYSTERECTOMY ABORTED DUE TO EXTENSIVE ENDOMETRIOSIS W/ DENSE PELVIC ADHESIONS INVOLVING UTERUS AND LOWER RECTOSIGMOID   HYSTEROSCOPY WITH D & C  05/06/2009    FAMILY HISTORY Family History  Problem Relation Age of Onset   Vision loss Mother    Hypertension Mother    Anesthesia problems Mother        post-op N/V   Heart disease Mother        carotid stenosis s/p CAE   Osteoporosis Mother    Eczema Mother    Depression Father    Heart disease Sister 30       arrhythmia   Hypertension Sister    Hyperlipidemia Sister    Diabetes Brother    Hypertension Brother    Cancer Brother        skin   Hyperlipidemia Brother    Stroke Brother    Kidney failure Maternal Grandmother    Heart disease Maternal Grandfather    COPD Daughter    Atopy Daughter    Cancer Cousin    Hypertension Other    Colon cancer Neg Hx    Allergic rhinitis Neg Hx    Asthma Neg Hx    Urticaria Neg Hx     SOCIAL HISTORY Social History   Tobacco Use    Smoking status: Never   Smokeless tobacco: Never  Vaping Use   Vaping Use: Never used  Substance Use Topics   Alcohol use: No    Alcohol/week: 0.0 standard drinks   Drug use: No         OPHTHALMIC EXAM:  Base  Eye Exam     Visual Acuity (ETDRS)       Right Left   Dist cc 20/20 -2 20/20         Tonometry (Tonopen, 9:53 AM)       Right Left   Pressure 12 12         Pupils       Pupils Dark Light Shape React APD   Right PERRL 4 3 Round Brisk None   Left PERRL 4 3 Round Brisk None         Visual Fields (Counting fingers)       Left Right    Full Full         Extraocular Movement       Right Left    Full Full         Neuro/Psych     Oriented x3: Yes   Mood/Affect: Normal         Dilation     Left eye: 1.0% Mydriacyl, 2.5% Phenylephrine @ 9:53 AM           Slit Lamp and Fundus Exam     External Exam       Right Left   External Normal Normal         Slit Lamp Exam       Right Left   Lids/Lashes Normal Normal   Conjunctiva/Sclera White and quiet White and quiet   Cornea Clear Clear   Anterior Chamber Deep and quiet Deep and quiet   Iris Round and reactive Round and reactive   Lens 2+ Nuclear sclerosis 2+ Nuclear sclerosis   Anterior Vitreous Normal Normal         Fundus Exam       Right Left   Posterior Vitreous  Posterior vitreous detachment   Disc  Peripapillary atrophy   C/D Ratio  0.5   Macula  Retinal pigment epithelial mottling, no exudates, no hemorrhage,  macular thickening, Early age related macular degeneration, Pigmented atrophy, nonturbid subretinal fluid   Vessels  Normal, , no DR   Periphery  Normal            IMAGING AND PROCEDURES  Imaging and Procedures for 12/26/20  OCT, Retina - OU - Both Eyes       Right Eye Quality was good. Scan locations included subfoveal. Central Foveal Thickness: 253. Progression has been stable. Findings include retinal drusen , no IRF, no SRF.   Left  Eye Quality was good. Scan locations included subfoveal. Central Foveal Thickness: 2785. Progression has been stable. Findings include subretinal fluid, pigment epithelial detachment, no IRF.   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  Repeat injection today intravitreal Lucentis     Intravitreal Injection, Pharmacologic Agent - OS - Left Eye       Time Out 12/26/2020. 10:35 AM. Confirmed correct patient, procedure, site, and patient consented.   Anesthesia Topical anesthesia was used. Anesthetic medications included Akten 3.5%.   Procedure Preparation included Tobramycin 0.3%, 5% betadine to ocular surface. A 30 gauge needle was used.   Injection: 0.5 mg Ranibizumab 0.5 MG/0.05ML   Route: Intravitreal, Site: Left Eye   NDC: [id number], Lot: [id number], Waste: 0 mL   Post-op Post injection exam found visual acuity of at least counting fingers. The patient tolerated the procedure well. There were no complications. The patient received written and verbal post procedure care education. Post  injection medications included ocuflox.              ASSESSMENT/PLAN:  Exudative age-related macular degeneration of left eye with active choroidal neovascularization (HCC) Chronic active CNVM by serous retinal detachment in subfoveal location associated with PED.  Stabilized on intravitreal Lucentis and no progression at 6-week interval yet still active.  We will repeat injection today and examination again in 6 weeks left eye  Serous detachment of retinal pigment epithelium of left eye See comments under wet AMD left eye  Intermediate stage nonexudative age-related macular degeneration of both eyes OD no signs of CNVM by OCT  Vitreomacular adhesion of left eye See vertical stranding as of 12-26-2020 centrally OS     ICD-10-CM   1. Exudative age-related  macular degeneration of left eye with active choroidal neovascularization (HCC)  H35.3221 OCT, Retina - OU - Both Eyes    Intravitreal Injection, Pharmacologic Agent - OS - Left Eye    Ranibizumab SOSY 0.5 mg    2. Serous detachment of retinal pigment epithelium of left eye  H35.722     3. Intermediate stage nonexudative age-related macular degeneration of both eyes  H35.3132     4. Vitreomacular adhesion of left eye  H43.822       1.  OS stable acuity and stable macular findings of serous retinal detachment left eye Subfoveal location with possible some component of vitreomacular adhesion as noted on Careful OCT evaluation with anterior posterior traction notable stranding. 2.  Repeat intravitreal Lucentis OS today and hope for spontaneous vitreomacular adhesion release OS  3.  Ophthalmic Meds Ordered this visit:  Meds ordered this encounter  Medications   Ranibizumab SOSY 0.5 mg       Return in about 6 weeks (around 02/06/2021) for dilate, OS, LUCENTIS 0.5 OCT.  There are no Patient Instructions on file for this visit.   Explained the diagnoses, plan, and follow up with the patient and they expressed understanding.  Patient expressed understanding of the importance of proper follow up care.   Clent Demark Arabia Nylund M.D. Diseases & Surgery of the Retina and Vitreous [facility] [place] 12/26/20     Abbreviations: M myopia (nearsighted); A astigmatism; H hyperopia (farsighted); P presbyopia; Mrx spectacle prescription;  CTL contact lenses; OD right eye; OS left eye; OU both eyes  XT exotropia; ET esotropia; PEK punctate epithelial keratitis; PEE punctate epithelial erosions; DES dry eye syndrome; MGD meibomian gland dysfunction; ATs artificial tears; PFAT's preservative free artificial tears; [place] nuclear sclerotic cataract; PSC posterior subcapsular cataract; ERM epi-retinal membrane; PVD posterior vitreous detachment; RD retinal detachment; DM diabetes mellitus; DR  diabetic retinopathy; NPDR non-proliferative diabetic retinopathy; PDR proliferative diabetic retinopathy; CSME clinically significant macular edema; DME diabetic macular edema; dbh dot blot hemorrhages; CWS cotton wool spot; POAG primary open angle glaucoma; C/D cup-to-disc ratio; HVF humphrey visual field; GVF goldmann visual field; OCT optical coherence tomography; IOP intraocular pressure; BRVO Branch retinal vein occlusion; CRVO central retinal vein occlusion; CRAO central retinal artery occlusion; BRAO branch retinal artery occlusion; RT retinal tear; SB scleral buckle; PPV pars plana vitrectomy; VH Vitreous hemorrhage; PRP panretinal laser photocoagulation; IVK intravitreal kenalog; VMT vitreomacular traction; MH Macular hole;  NVD neovascularization of the disc; NVE neovascularization elsewhere; AREDS age related eye disease study; ARMD age related macular degeneration; POAG primary open angle glaucoma; EBMD epithelial/anterior basement membrane dystrophy; ACIOL anterior chamber intraocular lens; IOL intraocular lens; PCIOL posterior chamber intraocular lens; Phaco/IOL phacoemulsification with intraocular lens placement; PRK photorefractive keratectomy; LASIK  laser assisted in situ keratomileusis; HTN hypertension; DM diabetes mellitus; COPD chronic obstructive pulmonary disease

## 2021-01-02 DIAGNOSIS — Z1231 Encounter for screening mammogram for malignant neoplasm of breast: Secondary | ICD-10-CM | POA: Diagnosis not present

## 2021-01-18 DIAGNOSIS — M8588 Other specified disorders of bone density and structure, other site: Secondary | ICD-10-CM | POA: Diagnosis not present

## 2021-01-18 DIAGNOSIS — N958 Other specified menopausal and perimenopausal disorders: Secondary | ICD-10-CM | POA: Diagnosis not present

## 2021-01-18 DIAGNOSIS — Z124 Encounter for screening for malignant neoplasm of cervix: Secondary | ICD-10-CM | POA: Diagnosis not present

## 2021-01-18 DIAGNOSIS — M48061 Spinal stenosis, lumbar region without neurogenic claudication: Secondary | ICD-10-CM | POA: Diagnosis not present

## 2021-01-18 DIAGNOSIS — L292 Pruritus vulvae: Secondary | ICD-10-CM | POA: Diagnosis not present

## 2021-01-18 DIAGNOSIS — Z6841 Body Mass Index (BMI) 40.0 and over, adult: Secondary | ICD-10-CM | POA: Diagnosis not present

## 2021-01-20 DIAGNOSIS — H40012 Open angle with borderline findings, low risk, left eye: Secondary | ICD-10-CM | POA: Diagnosis not present

## 2021-01-20 DIAGNOSIS — H0102B Squamous blepharitis left eye, upper and lower eyelids: Secondary | ICD-10-CM | POA: Diagnosis not present

## 2021-01-20 DIAGNOSIS — H0102A Squamous blepharitis right eye, upper and lower eyelids: Secondary | ICD-10-CM | POA: Diagnosis not present

## 2021-01-20 DIAGNOSIS — H401111 Primary open-angle glaucoma, right eye, mild stage: Secondary | ICD-10-CM | POA: Diagnosis not present

## 2021-01-20 DIAGNOSIS — E119 Type 2 diabetes mellitus without complications: Secondary | ICD-10-CM | POA: Diagnosis not present

## 2021-01-20 DIAGNOSIS — H353221 Exudative age-related macular degeneration, left eye, with active choroidal neovascularization: Secondary | ICD-10-CM | POA: Diagnosis not present

## 2021-01-20 DIAGNOSIS — Z961 Presence of intraocular lens: Secondary | ICD-10-CM | POA: Diagnosis not present

## 2021-02-06 ENCOUNTER — Ambulatory Visit (INDEPENDENT_AMBULATORY_CARE_PROVIDER_SITE_OTHER): Payer: Medicare Other | Admitting: Ophthalmology

## 2021-02-06 ENCOUNTER — Ambulatory Visit: Payer: Medicare Other | Admitting: Neurology

## 2021-02-06 ENCOUNTER — Encounter (INDEPENDENT_AMBULATORY_CARE_PROVIDER_SITE_OTHER): Payer: Self-pay | Admitting: Ophthalmology

## 2021-02-06 ENCOUNTER — Other Ambulatory Visit: Payer: Self-pay

## 2021-02-06 DIAGNOSIS — H43822 Vitreomacular adhesion, left eye: Secondary | ICD-10-CM | POA: Diagnosis not present

## 2021-02-06 DIAGNOSIS — G4733 Obstructive sleep apnea (adult) (pediatric): Secondary | ICD-10-CM | POA: Diagnosis not present

## 2021-02-06 DIAGNOSIS — H353221 Exudative age-related macular degeneration, left eye, with active choroidal neovascularization: Secondary | ICD-10-CM

## 2021-02-06 DIAGNOSIS — Z9989 Dependence on other enabling machines and devices: Secondary | ICD-10-CM

## 2021-02-06 DIAGNOSIS — H43821 Vitreomacular adhesion, right eye: Secondary | ICD-10-CM

## 2021-02-06 MED ORDER — RANIBIZUMAB 0.5 MG/0.05ML IZ SOSY
0.5000 mg | PREFILLED_SYRINGE | INTRAVITREAL | Status: AC | PRN
Start: 2021-02-06 — End: 2021-02-06
  Administered 2021-02-06: .5 mg via INTRAVITREAL

## 2021-02-06 NOTE — Assessment & Plan Note (Signed)
Observe OD

## 2021-02-06 NOTE — Assessment & Plan Note (Signed)
Patient reports compliance with CPAP

## 2021-02-06 NOTE — Assessment & Plan Note (Signed)
No interval change OS

## 2021-02-06 NOTE — Assessment & Plan Note (Signed)
OS, active serous foveal fluid with preserved acuity/vascularized PED.  Repeat Lucentis OS today to maintain and control.  Currently at 6-week follow-up.

## 2021-02-06 NOTE — Progress Notes (Signed)
02/06/2021     CHIEF COMPLAINT Patient presents for  Chief Complaint  Patient presents with   Retina Follow Up      HISTORY OF PRESENT ILLNESS: [name] is a 75 y.o. female who presents to the clinic today for:   HPI     Retina Follow Up   Patient presents with  Wet AMD.  In left eye.  This started 6 weeks ago.  Duration of 6 weeks.        Comments   6 week f/u OS with OCT and possible Lucentis injection OS  EyeMeds: Combigan BID OU      Last edited by Reather Littler, COA on 02/06/2021  9:33 AM.      Referring physician: Ferd Hibbs, NP [place],  [place] [address]  HISTORICAL INFORMATION:   Selected notes from the MEDICAL RECORD NUMBER    Lab Results  Component Value Date   HGBA1C 7.0 (H) 04/14/2020     CURRENT MEDICATIONS: Current Outpatient Medications (Ophthalmic Drugs)  Medication Sig   Brimonidine Tartrate-Timolol (COMBIGAN OP) Apply 1 drop to eye 2 (two) times daily. In both eyes.   No current facility-administered medications for this visit. (Ophthalmic Drugs)   Current Outpatient Medications (Other)  Medication Sig   aspirin 81 MG tablet Take 81 mg by mouth daily.   benzonatate (TESSALON) 100 MG capsule Take 1-2 capsules (100-200 mg total) by mouth 3 (three) times daily as needed for cough.   cholecalciferol (VITAMIN D) 1000 units tablet Take 1,000 Units by mouth daily.   ezetimibe (ZETIA) 10 MG tablet Take 1 tablet (10 mg total) by mouth daily after supper.   fluticasone (FLONASE) 50 MCG/ACT nasal spray Place 1 spray into both nostrils daily.   fluticasone (FLOVENT HFA) 44 MCG/ACT inhaler Inhale 2 puffs into the lungs 2 (two) times daily. With spacer.   glucose blood test strip Use as instructed   loratadine (CLARITIN) 10 MG tablet Take 1 tablet (10 mg total) by mouth daily.   metFORMIN (GLUCOPHAGE) 500 MG tablet Take 1 tablet (500 mg total) by mouth daily with breakfast.   metoprolol succinate (TOPROL-XL) 25  MG 24 hr tablet Take 1 tablet by mouth once daily   simvastatin (ZOCOR) 20 MG tablet TAKE 1 TABLET BY MOUTH AT BEDTIME   Turmeric Curcumin 500 MG CAPS Take by mouth.   vitamin C (ASCORBIC ACID) 500 MG tablet Take 500 mg by mouth 2 (two) times daily.    Zinc 50 MG TABS    Current Facility-Administered Medications (Other)  Medication Route   EPINEPHrine (Anaphylaxis) SOLN 0.3 mg Intramuscular      REVIEW OF SYSTEMS:    ALLERGIES Allergies  Allergen Reactions   Peanut (Diagnostic) Cough, Hypertension and Shortness Of Breath   Adhesive [Tape] Rash   Atorvastatin Other (See Comments)    Myalgia   Crestor [Rosuvastatin] Other (See Comments)    Severe arthralgia and myalgia   Latex Itching and Other (See Comments)    Causes blisters   Lovastatin Other (See Comments)    Arthralgias, hyperglycemia with lovastatin.    PAST MEDICAL HISTORY Past Medical History:  Diagnosis Date   Allergic rhinitis 03/23/2013   Allergy    Anemia    due to vaginal bleeding   Arthritis    hands   Asthmatic bronchitis 09/03/2017   Breast cancer ([place]) 08/2012   left   Dental crowns present    Eczema    Fibroids  Headache(784.0)    tension   History of endometriosis    Hordeolum externum right upper eyelid 07/25/2020   New finding OD today not noticed by the patient prior   Hyperlipidemia    Hypertension    under control with med., has been on med. x 3 yr.   Nuclear sclerotic cataract of right eye 12/02/2019   OSA (obstructive sleep apnea)    Postmenopausal bleeding    Recurrent upper respiratory infection (URI)    S/P radiation therapy 10/20/2012-12/05/2012   left breast 50.4 gray, lumpectomy cavity boosted to 62.4 gray   Seasonal allergies    Sleep apnea    uses CPAP nightly   Spinal stenosis    Urinary urgency    White coat hypertension    Past Surgical History:  Procedure Laterality Date   BACK SURGERY  1997   lumbar   BREAST LUMPECTOMY WITH NEEDLE LOCALIZATION AND AXILLARY  SENTINEL LYMPH NODE BX Left 09/03/2012   Procedure: BREAST LUMPECTOMY WITH NEEDLE LOCALIZATION AND AXILLARY SENTINEL LYMPH NODE BX;  Surgeon: Edward Jolly, MD;  Location: [facility];  Service: General;  Laterality: Left;   CHOLECYSTECTOMY  1995   COLONOSCOPY W/ POLYPECTOMY  03/20/2007   DILATION AND CURETTAGE OF UTERUS  2005   DILATION AND CURETTAGE OF UTERUS  06/22/2011   Procedure: DILATATION AND CURETTAGE;  Surgeon: Alvino Chapel, MD;  Location: [facility];  Service: Gynecology;  Laterality: N/A;  OK PER KEELA FOR 7:15 START   DORSAL COMPARTMENT RELEASE Right 05/19/2013   Procedure: RELEASE 1ST  DORSAL COMPARTMENT RIGHT (DEQUERVAIN);  Surgeon: Cammie Sickle., MD;  Location: [facility];  Service: Orthopedics;  Laterality: Right;   EXPLORATORY LAPAROTOMY  06-30-2007   ATTEMPTED HYSTERECTOMY ABORTED DUE TO EXTENSIVE ENDOMETRIOSIS W/ DENSE PELVIC ADHESIONS INVOLVING UTERUS AND LOWER RECTOSIGMOID   HYSTEROSCOPY WITH D & C  05/06/2009    FAMILY HISTORY Family History  Problem Relation Age of Onset   Vision loss Mother    Hypertension Mother    Anesthesia problems Mother        post-op N/V   Heart disease Mother        carotid stenosis s/p CAE   Osteoporosis Mother    Eczema Mother    Depression Father    Heart disease Sister 91       arrhythmia   Hypertension Sister    Hyperlipidemia Sister    Diabetes Brother    Hypertension Brother    Cancer Brother        skin   Hyperlipidemia Brother    Stroke Brother    Kidney failure Maternal Grandmother    Heart disease Maternal Grandfather    COPD Daughter    Atopy Daughter    Cancer Cousin    Hypertension Other    Colon cancer Neg Hx    Allergic rhinitis Neg Hx    Asthma Neg Hx    Urticaria Neg Hx     SOCIAL HISTORY Social History   Tobacco Use   Smoking status: Never   Smokeless tobacco: Never  Vaping Use   Vaping Use: Never used  Substance Use Topics   Alcohol  use: No    Alcohol/week: 0.0 standard drinks   Drug use: No         OPHTHALMIC EXAM:  Base Eye Exam     Visual Acuity (ETDRS)       Right Left   Dist [place] 20/20 20/20 -1  Tonometry (Tonopen, 9:40 AM)       Right Left   Pressure 15 13         Pupils       Pupils Dark Light Shape React APD   Right PERRL 4 3 Round Brisk None   Left PERRL 4 3 Round Brisk None         Visual Fields (Counting fingers)       Left Right    Full Full         Extraocular Movement       Right Left    Full, Ortho Full, Ortho         Neuro/Psych     Oriented x3: Yes   Mood/Affect: Normal         Dilation     Left eye: 1.0% Mydriacyl, 2.5% Phenylephrine @ 9:40 AM           Slit Lamp and Fundus Exam     External Exam       Right Left   External Normal Normal         Slit Lamp Exam       Right Left   Lids/Lashes Normal Normal   Conjunctiva/Sclera White and quiet White and quiet   Cornea Clear Clear   Anterior Chamber Deep and quiet Deep and quiet   Iris Round and reactive Round and reactive   Lens Centered posterior chamber intraocular lens Centered posterior chamber intraocular lens   Anterior Vitreous Normal Normal         Fundus Exam       Right Left   Posterior Vitreous  Posterior vitreous detachment?, Vitreous debris, no Weiss ring the   Disc  Peripapillary atrophy   C/D Ratio  0.5   Macula  Retinal pigment epithelial mottling, no exudates, no hemorrhage, macular thickening, Early age related macular degeneration, Pigmented atrophy, nonturbid subretinal fluid   Vessels  Normal, , no DR   Periphery  Normal            IMAGING AND PROCEDURES  Imaging and Procedures for 02/06/21  OCT, Retina - OU - Both Eyes       Right Eye Quality was good. Scan locations included subfoveal. Central Foveal Thickness: 253. Progression has been stable. Findings include retinal drusen , no IRF, no SRF.   Left Eye Quality was good. Scan  locations included subfoveal. Central Foveal Thickness: 275. Progression has been stable. Findings include subretinal fluid, pigment epithelial detachment, no IRF.   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  Repeat injection today intravitreal Lucentis, no clear delineation of VMA or PVD OS  OD, incidental partial PVD with VMA to macula remained     Intravitreal Injection, Pharmacologic Agent - OS - Left Eye       Time Out 02/06/2021. 10:22 AM. Confirmed correct patient, procedure, site, and patient consented.   Anesthesia Topical anesthesia was used. Anesthetic medications included Akten 3.5%.   Procedure Preparation included Tobramycin 0.3%, 5% betadine to ocular surface, 10% betadine to eyelids. A 30 gauge needle was used.   Injection: 0.5 mg Ranibizumab 0.5 MG/0.05ML   Route: Intravitreal, Site: Left Eye   NDC: [id number], Lot: [id number], Waste: 0 mL   Post-op Post injection exam found visual acuity of at least counting fingers. The patient tolerated the procedure well. There were no complications. The patient received written and verbal post procedure care education.  Post injection medications included ocuflox.              ASSESSMENT/PLAN:  Obstructive sleep apnea on CPAP Patient reports compliance with CPAP  Exudative age-related macular degeneration of left eye with active choroidal neovascularization (HCC) OS, active serous foveal fluid with preserved acuity/vascularized PED.  Repeat Lucentis OS today to maintain and control.  Currently at 6-week follow-up.  Vitreomacular adhesion of left eye No interval change OS  Vitreomacular adhesion of right eye Observe OD     ICD-10-CM   1. Exudative age-related macular degeneration of left eye with active choroidal neovascularization (HCC)  H35.3221 OCT, Retina - OU - Both Eyes     Intravitreal Injection, Pharmacologic Agent - OS - Left Eye    Ranibizumab SOSY 0.5 mg    2. Obstructive sleep apnea on CPAP  G47.33    Z99.89     3. Vitreomacular adhesion of left eye  H43.822     4. Vitreomacular adhesion of right eye  H43.821       1.  Persistent subfoveal , With vascularized PED OS.  Overall VMA OS however there is no demonstrable EVD noted.  2.  PVD OD, not pathologic  3.  Ophthalmic Meds Ordered this visit:  Meds ordered this encounter  Medications   Ranibizumab SOSY 0.5 mg       Return in about 6 weeks (around 03/20/2021) for dilate, OS, LUCENTIS 0.5 OCT.  There are no Patient Instructions on file for this visit.   Explained the diagnoses, plan, and follow up with the patient and they expressed understanding.  Patient expressed understanding of the importance of proper follow up care.   Clent Demark Donovon Micheletti M.D. Diseases & Surgery of the Retina and Vitreous [facility]ina [facility] Diabetic [place] 02/06/21     Abbreviations: M myopia (nearsighted); A astigmatism; H hyperopia (farsighted); P presbyopia; Mrx spectacle prescription;  CTL contact lenses; OD right eye; OS left eye; OU both eyes  XT exotropia; ET esotropia; PEK punctate epithelial keratitis; PEE punctate epithelial erosions; DES dry eye syndrome; MGD meibomian gland dysfunction; ATs artificial tears; PFAT's preservative free artificial tears; [place] nuclear sclerotic cataract; PSC posterior subcapsular cataract; ERM epi-retinal membrane; PVD posterior vitreous detachment; RD retinal detachment; DM diabetes mellitus; DR diabetic retinopathy; NPDR non-proliferative diabetic retinopathy; PDR proliferative diabetic retinopathy; CSME clinically significant macular edema; DME diabetic macular edema; dbh dot blot hemorrhages; CWS cotton wool spot; POAG primary open angle glaucoma; C/D cup-to-disc ratio; HVF humphrey visual field; GVF goldmann visual field; OCT optical coherence tomography; IOP intraocular pressure;  BRVO Branch retinal vein occlusion; CRVO central retinal vein occlusion; CRAO central retinal artery occlusion; BRAO branch retinal artery occlusion; RT retinal tear; SB scleral buckle; PPV pars plana vitrectomy; VH Vitreous hemorrhage; PRP panretinal laser photocoagulation; IVK intravitreal kenalog; VMT vitreomacular traction; MH Macular hole;  NVD neovascularization of the disc; NVE neovascularization elsewhere; AREDS age related eye disease study; ARMD age related macular degeneration; POAG primary open angle glaucoma; EBMD epithelial/anterior basement membrane dystrophy; ACIOL anterior chamber intraocular lens; IOL intraocular lens; PCIOL posterior chamber intraocular lens; Phaco/IOL phacoemulsification with intraocular lens placement; Vass photorefractive keratectomy; LASIK laser assisted in situ keratomileusis; HTN hypertension; DM diabetes mellitus; COPD chronic obstructive pulmonary disease

## 2021-02-07 ENCOUNTER — Telehealth (INDEPENDENT_AMBULATORY_CARE_PROVIDER_SITE_OTHER): Payer: Self-pay

## 2021-02-07 NOTE — Telephone Encounter (Signed)
Telephone note cont: Gel in order to relieve symptoms. Pt assured that it is likely that symptoms will resolve in the next 24 hours. I did instruct her to call us back if anything changes.

## 2021-02-08 ENCOUNTER — Encounter: Payer: Self-pay | Admitting: Neurology

## 2021-02-08 ENCOUNTER — Ambulatory Visit (INDEPENDENT_AMBULATORY_CARE_PROVIDER_SITE_OTHER): Payer: Medicare Other | Admitting: Neurology

## 2021-02-08 ENCOUNTER — Other Ambulatory Visit: Payer: Self-pay

## 2021-02-08 VITALS — BP 130/78 | HR 75 | Ht 61.0 in | Wt 217.0 lb

## 2021-02-08 DIAGNOSIS — G4733 Obstructive sleep apnea (adult) (pediatric): Secondary | ICD-10-CM | POA: Diagnosis not present

## 2021-02-08 DIAGNOSIS — I739 Peripheral vascular disease, unspecified: Secondary | ICD-10-CM | POA: Diagnosis not present

## 2021-02-08 DIAGNOSIS — Z9989 Dependence on other enabling machines and devices: Secondary | ICD-10-CM | POA: Diagnosis not present

## 2021-02-08 NOTE — Progress Notes (Signed)
PATIENT: [name] DOB: Oct 15, 1945  REASON FOR VISIT: follow up- obstructive sleep apnea on CPAP HISTORY FROM: patient-pt here for the initial cpap compliance for her new machine. DME [facility] - [facility]       INTERVAL HISTORY : I am meeting with [name] on 02/08/21 at  1:30 PM EST , I have the pleasure of meeting with [name] today, she reports that after she underwent her sleep study she received a replacement for her [employer] through the manufacturer recall.  And she now has a new [employer] still in the box that no one became in the mail.  She is using ice her ResMed machine S9 AutoSet which is working just fine for her.  She uses her very old machine which still works at [employer].  Her compliance has always been 100%.  Compliance download shows on her air sense 11 ResMed machine a compliance of 70% [place] machine supplements the other 30% so incomplete combination she has 100% compliance with an average of 8 hours and 30 minutes nightly use.  She uses a minimum pressure of 6 and a maximum pressure of 12 cmH2O was 2 cm EPR, her 95th percentile pressure is 11.8 cm water.  Residual AHI is 1.0 no central apneas are arising and there is a 95th percentile air leak of 23.5 L/min noted which does not bother the patient. Wisp nasal mask. We agreed to address her main risk factor, obesity today and she is going to ask her PCP about Ozempic or other Apetite- constraining medications.         02-04-2021-RV  this is her first visit with me after she received a new machine.  In May of this year on 07/22/2019 she underwent a baseline polysomnogram again and this showed that the patient still had apnea her AHI was 27.4 for the total night and her REM sleep AHI was 5075.9/h so this is a strong REM dependent sleep apnea form all night was spent in supine position so I cannot judge if another position would have increased  or decreased her apnea count.  She spent 18 minutes total at below 89% oxygen saturation.  She had no periodic limb movements, her EKG was in normal sinus rhythm.  And she was asked to resume CPAP therapy with a new machine that she was doing for.  The machine was provided by [facility] in [place] after she had a bad experience with [facility].   Her compliance report speaks for 100% compliance by days and hours, with average use at time of 7 hours and 32 minutes nightly. Tthe machine is set for minimum pressure of 6 maximum pressure of 12 to centimeter EPR and an AHI of 1.3/h there are no central apneas arising and no evidence of Cheyne-Stokes respiration has been found.  The 97th percentile pressure is 10.7 cm so we could even increase her maximum pressure a little bit, air leaks are mild to moderate.. She is using the wisp nasal mask and is very happy with it.   Chief Complaint: she states things are going well. She is a beast cancer survivor, she needs to take estrogen blockers for a total of 7 years- that will be October 2021.  She has a new phone system , blocking Robocalls, and may have trouble to get info from DME [facility].  Patient states most recent machine is >5 yrs old. she uses that  one at home and its a [place]. We were  able to get download on her back up machine ( older CPAP ResMed [place] med.   Her newer machine is now also 76 years old and she needs a new one. She desperately wants to change DMEs. Last sleep study 09/2009 but she got a new dream-station philips machine without retesting in July 2016 , provided without prescription by [facility].   Interval history , left sided Wet Macular Degeneration and right sided Glaucoma,  Dr Midge Aver, Dr. Zadie Rhine. OSA certainly can influence the course of her eye disease.       07-08-2018 RV CD  We discussed the possible new issue of a new CPAP by 7 -2021, this  time through [facility] [place].  I will follow q 12 month with compliance and Epworth score. The patient had 100% compliance for the last 30 days with an average user time of 8 hours and 18 minutes and 5 seconds.  Each day the machine was used over 4 hours.  The 95th percentile pressure was 10.6 cmH2O the average residual AHI was 4.3/h of sleep, the mean pressure was 8.5 cm of water this current machine has an a flex setting of 2 cm and minimum pressure of 6 and a maximum pressure of 12 cmH2O.  The ramp time is 15 minutes long.  The humidifier is set at 2 and the heated tube is set at 3.  There needs to be no change in the patient's settings however she would much rather use her oldest machine which [place] then using the current machine a dream station auto CPAP from July 2016.  I will take care of that the next machine will be a ResMed machine again if possible.  It would likely be another auto titration device but I want to be able to tell if she has central or obstructive apneas and to find more out why her AHI is above 3/h.  She does not have significant air leaks by the way.   05-09-2017, CD [name] is a 75 year old caucasian, married female, here seen in the presence of her husband. She is a breast cancer survivor. She uses nasal pillows. 2 years ago she got a new machine after her first machine broke. At the time she switched from [facility] to nasal.    I received and reviewed the patient CPAP download from her DME company. It appears the patient has used her CPAP nightly for the last 30 days. Each night she uses her machine greater than 4 hours. On average she uses her machine 8 hours and 38 minutes. Her average pressure is 9.6 cm of water. Her residual AHI is 3.9. This is an excellent control of apnea per compliance download. Epworth 4, FSS 28. She reports her hands get numb, and  when in bed her feet will get numb- she had back surgery and had symptoms before ( left sided sciatica-  PAIN) .   [name] is a 75 year old female with a history of obstructive sleep apnea on CPAP. She returns today for a compliance download. Unfortunately we are unable to obtain a wireless download. We will have her DME company fax [place] over a 30 day download. The patient states that she continues to use her CPAP nightly. Denies any trouble falling asleep. She continues to feel that the machine works well. She does note that sometimes if  she sleeps on her side she will notice that the mask leaks slightly. She does change out her supplies regularly. She reports that she lost her eldest son on February 5. This was unexpected. She denies any new neurological symptoms. Returns today for an evaluation.  HISTORY  11/02/15 [facility]): Ms. [name] is a 75 year old female with a history of obstructive sleep apnea on CPAP. She returns today for a 30 day compliance download.    Her download today on 11/02/2015 documents an 87% compliance with an average user time of 6 hours and 37 minutes on all days. The patient's residual AHI has increased to 4.4 which is still good but used to be a little bit lower area to she uses an auto CPAP with a 90's percent pressure of 9.6 cm water the window of pressures between 6 and 12 cm water. She has a very long ramp time of 30 minutes. She has noticed that she has a little bit more trouble to initiate sleep on her new machine and I think this may be related to the very long ramp time. I will shorten the REM time to 12 minutes.   HISTORY 10/27/13:   [name] is 75 year old female with history of obstructive sleep Apnea on CPAP. She returns today for a 90 day compliance download. She brought her machine with her today and her reports shows an AHI of 2.2 on auto-titration at 6-12 cm of water. She uses her machine for  an average of 6 hours and 22 minutes a night, with 97%compliance. His Epworth score is 4 points was previously 3 . His fatigue severity score is 8 was previously 16.  Patient  reports that she gets about 6.5  hours of sleep a night. She goes to bed around 11:30 pm and arises between 6:30-7:30am. She denies having trouble falling asleep or staying a sleep. States that she normally does not have to get up at night to urinate. Overall patient feels that CPAP has improved his sleepiness and fatigue. Since the last visit the Patient was diagnosed with breast cancer in June 2014. She is on an estrogen blocker now which has caused her to gain weight. She states she is now cancer free.    Social  history : widowed, 01-Apr-2020,husband phil died within 59 months of the golden anniversary- Abbe Amsterdam was her driver, her mate, she has ambulatory difficulties, 4 prong cane and is somewhat afraid of driving.  Her  middle son was stationed in [place] and she used to travel with [employer]. Her eldest son died in 06/25/16, he was a Therapist, nutritional and sang. Youngest and middle son and daughter live in [place]. Marland Kitchen    REVIEW OF SYSTEMS: Out of a complete 14 system review of symptoms, the patient complains only of the following symptoms, and all other reviewed systems are negative.  Weight gain- she has been regaining weight after Phil's death she initially  lost  weight- grief reaction.   How likely are you to doze in the following situations: 0 = not likely, 1 = slight chance, 2 = moderate chance, 3 = high chance  Sitting and Reading? Watching Television? Sitting inactive in a public place (theater or meeting)? Lying down in the afternoon when circumstances permit? Sitting and talking to someone? Sitting quietly after lunch without alcohol?  1 In a car, while stopped for a few minutes in traffic? As a passenger in a car for an hour without a break?  1  Total = 2/24  I can't sleep without CPAP ! I sleep well.    FSS at 9/ 63 points.   GDS and 2/ 15 points.   ALLERGIES: Allergies  Allergen Reactions   Peanut (Diagnostic) Cough, Hypertension and Shortness Of Breath   Adhesive [Tape] Rash    Atorvastatin Other (See Comments)    Myalgia   Crestor [Rosuvastatin] Other (See Comments)    Severe arthralgia and myalgia   Latex Itching and Other (See Comments)    Causes blisters   Lovastatin Other (See Comments)    Arthralgias, hyperglycemia with lovastatin.    HOME MEDICATIONS: Outpatient Medications Prior to Visit  Medication Sig Dispense Refill   aspirin 81 MG tablet Take 81 mg by mouth daily.     benzonatate (TESSALON) 100 MG capsule Take 1-2 capsules (100-200 mg total) by mouth 3 (three) times daily as needed for cough. 20 capsule 0   Brimonidine Tartrate-Timolol (COMBIGAN OP) Apply 1 drop to eye 2 (two) times daily. In both eyes.     cholecalciferol (VITAMIN D) 1000 units tablet Take 1,000 Units by mouth daily.     fluticasone (FLONASE) 50 MCG/ACT nasal spray Place 1 spray into both nostrils daily. 16 g 5   fluticasone (FLOVENT HFA) 44 MCG/ACT inhaler Inhale 2 puffs into the lungs 2 (two) times daily. With spacer. 1 Inhaler 5   glucose blood test strip Use as instructed 100 each 4   loratadine (CLARITIN) 10 MG tablet Take 1 tablet (10 mg total) by mouth daily.     metFORMIN (GLUCOPHAGE) 500 MG tablet Take 1 tablet (500 mg total) by mouth daily with breakfast. (Patient taking differently: Take 500 mg by mouth 3 (three) times daily.) 90 tablet 1   metoprolol succinate (TOPROL-XL) 25 MG 24 hr tablet Take 1 tablet by mouth once daily 90 tablet 0   simvastatin (ZOCOR) 20 MG tablet TAKE 1 TABLET BY MOUTH AT BEDTIME 90 tablet 0   Turmeric Curcumin 500 MG CAPS Take by mouth.     vitamin C (ASCORBIC ACID) 500 MG tablet Take 500 mg by mouth 2 (two) times daily.      Zinc 50 MG TABS      ezetimibe (ZETIA) 10 MG tablet Take 1 tablet (10 mg total) by mouth daily after supper. 90 tablet 3   Facility-Administered Medications Prior to Visit  Medication Dose Route Frequency Provider Last Rate Last Admin   EPINEPHrine (Anaphylaxis) SOLN 0.3 mg  0.3 mg Intramuscular Once Bobbitt, Sedalia Muta, MD        PAST MEDICAL HISTORY: Past Medical History:  Diagnosis Date   Allergic rhinitis 03/23/2013   Allergy    Anemia    due to vaginal bleeding   Arthritis    hands   Asthmatic bronchitis 09/03/2017   Breast cancer ([place]) 08/2012   left   Dental crowns present    Eczema    Fibroids    Headache(784.0)    tension   History of endometriosis    Hordeolum externum right upper eyelid 07/25/2020   New finding OD today not noticed by the patient prior   Hyperlipidemia    Hypertension    under control with med., has been on med. x 3 yr.   Nuclear sclerotic cataract of right eye 12/02/2019   OSA (obstructive sleep apnea)    Postmenopausal bleeding    Recurrent upper respiratory infection (URI)    S/P radiation therapy 10/20/2012-12/05/2012   left breast 50.4 gray, lumpectomy cavity boosted to 62.4 gray   Seasonal  allergies    Sleep apnea    uses CPAP nightly   Spinal stenosis    Urinary urgency    White coat hypertension     PAST SURGICAL HISTORY: Past Surgical History:  Procedure Laterality Date   BACK SURGERY  1997   lumbar   BREAST LUMPECTOMY WITH NEEDLE LOCALIZATION AND AXILLARY SENTINEL LYMPH NODE BX Left 09/03/2012   Procedure: BREAST LUMPECTOMY WITH NEEDLE LOCALIZATION AND AXILLARY SENTINEL LYMPH NODE BX;  Surgeon: Edward Jolly, MD;  Location: [facility];  Service: General;  Laterality: Left;   CHOLECYSTECTOMY  1995   COLONOSCOPY W/ POLYPECTOMY  03/20/2007   DILATION AND CURETTAGE OF UTERUS  2005   DILATION AND CURETTAGE OF UTERUS  06/22/2011   Procedure: DILATATION AND CURETTAGE;  Surgeon: Alvino Chapel, MD;  Location: [facility];  Service: Gynecology;  Laterality: N/A;  OK PER KEELA FOR 7:15 START   DORSAL COMPARTMENT RELEASE Right 05/19/2013   Procedure: RELEASE 1ST  DORSAL COMPARTMENT RIGHT (DEQUERVAIN);  Surgeon: Cammie Sickle., MD;  [facility];  Service: Orthopedics;  Laterality:  Right;   EXPLORATORY LAPAROTOMY  06-30-2007   ATTEMPTED HYSTERECTOMY ABORTED DUE TO EXTENSIVE ENDOMETRIOSIS W/ DENSE PELVIC ADHESIONS INVOLVING UTERUS AND LOWER RECTOSIGMOID   HYSTEROSCOPY WITH D & C  05/06/2009    FAMILY HISTORY: Family History  Problem Relation Age of Onset   Vision loss Mother    Hypertension Mother    Anesthesia problems Mother        post-op N/V   Heart disease Mother        carotid stenosis s/p CAE   Osteoporosis Mother    Eczema Mother    Depression Father    Heart disease Sister 8       arrhythmia   Hypertension Sister    Hyperlipidemia Sister    Diabetes Brother    Hypertension Brother    Cancer Brother        skin   Hyperlipidemia Brother    Stroke Brother    Kidney failure Maternal Grandmother    Heart disease Maternal Grandfather    COPD Daughter    Atopy Daughter    Cancer Cousin    Hypertension Other    Colon cancer Neg Hx    Allergic rhinitis Neg Hx    Asthma Neg Hx    Urticaria Neg Hx     SOCIAL HISTORY: Social History   Socioeconomic History   Marital status: Widowed    Spouse name: [name]   Number of children: 4   Years of education: College   Highest education level: Not on file  Occupational History   Occupation: retired  Tobacco Use   Smoking status: Never   Smokeless tobacco: Never  Vaping Use   Vaping Use: Never used  Substance and Sexual Activity   Alcohol use: No    Alcohol/week: 0.0 standard drinks   Drug use: No   Sexual activity: Yes    Comment: menarche 52, P4, Premarin for short while, Provera x 3 years  Other Topics Concern   Not on file  Social History Narrative   Marital status: married x 77  years; happily married; no abuse.      Children: 4 children (2 sons living; 1 son died in 4860 at age 69years old, 1 daughter); six grandchildren; no gg.      Lives:  With husband.      Employed: retired in 2011; [employer].  Tobacco: none       Alcohol:  None      Drugs: none       Exercise:  Rowing 3 days per week.      Seatbelt:  100%      Sunscreen:  Face SPF 15.      Guns:  Loaded mostly secured.      ADLs:  Drives minimally; no assistant devices; independent with all ADLs.       Advanced Directives: YES; FULL CODE but no prolonged measures.      Her nocturia is improved under CPAP use at 10 cm water- AHI is now 0.8 and from 30 at baseline. CMS compliance  6 hours 40 minutes - sleeps on the side, 08-10-11 .FFM - likes it.    . Flonase used prn.     Caffeine Use: 1 cup daily   Social Determinants of Health   Financial Resource Strain: Not on file  Food Insecurity: Not on file  Transportation Needs: Not on file  Physical Activity: Not on file  Stress: Not on file  Social Connections: Not on file  Intimate Partner Violence: Not on file   Peanut allergy- latex allergy.    Vitals:   02/08/21 1315  BP: 130/78  Pulse: 75  Weight: 217 lb (98.4 kg)  Height: 5\' 1"  (1.549 m)   Body mass index is 41 kg/m.  Left breast mastectomy.  Lymphedema, in left arm.  Ankle edema noted.  Neck Circumference : 17 inches, Mallampati 2-   Generalized: Well developed, in no acute distress, well groomed.   Neurological examination  Mentation: Alert oriented to time, place, history taking.  Follows all commands speech and language fluent Cranial nerve:  Taste and smell have recovered- impaired by chemotherapy. . Pupils are equally round and responsive.  Extraocular movements were fully intact.  Uvula and tongue move in midline.  Head turning and shoulder shrug  were normal and symmetric.  Motor: restricted  ROM in all proximal 4 extremities. Pain, myalgia -  Symmetric grip, no tremors, . Coordination:  finger-nose bilaterally intact, no tremor and no ataxia.   Gait and station: Gait is wide based. This is obesity related.  Balance problems when getting up in AM.   DIAGNOSTIC DATA (LABS, IMAGING, TESTING) - I reviewed patient records, labs, notes, testing and imaging  myself where available.   Lab Results  Component Value Date   CHOL 228 (H) 04/14/2020   HDL 42 04/14/2020   LDLCALC 157 (H) 04/14/2020   TRIG 157 (H) 04/14/2020   CHOLHDL 5.4 (H) 04/14/2020   Lab Results  Component Value Date   HGBA1C 7.0 (H) 04/14/2020    Lab Results  Component Value Date   TSH 2.010 04/14/2020     I discussed the assessment and treatment plan with the patient. The patient was provided an opportunity to ask questions and all were answered. The patient agreed with the plan and demonstrated an understanding of the instructions.      1. Obstructive sleep apnea on CPAP. Her old machine was a [employer] and has ben replaced by [employer]. The patients old machine was replaced after we underwent sleep test in 2021, confirmed the need for CPAP treatment and ordered a new machine.  She is using it compliantly and she is happy with the therapy.   2.  OSA -Main risk factor - obesity.We agreed to address her main risk factor, obesity today and she is going to ask her PCP about Ozempic or  other Apetite- constraining medications.    3 Left Breast cancer/ survivor, status post radiation. Cold sensitive , gaining weight. Lymphedema in lef upper extremity.   DME is  [facility] in [place].  RV in 12 month   provided 28 minutes of face-to-face time during this encounter.   Larey Seat, MD  Larey Seat, MD  02/08/2021, 1:37 PM [facility] [address] [place], [place] [address] [phone]

## 2021-02-08 NOTE — Patient Instructions (Signed)
Obesity, Adult Obesity is the condition of having too much total body fat. Being overweight or obese means that your weight is greater than what is considered healthy for your body size. Obesity is determined by a measurement called BMI (body mass index). BMI is an estimate of body fat and is calculated from height and weight. For adults, a BMI of 30 or higher is considered obese. Obesity can lead to other health concerns and major illnesses, including: Stroke. Coronary artery disease (CAD). Type 2 diabetes. Some types of cancer, including cancers of the colon, breast, uterus, and gallbladder. High blood pressure (hypertension). High cholesterol. Gallbladder stones. Obesity can also contribute to: Osteoarthritis. Sleep apnea. Infertility problems. What are the causes? Common causes of this condition include: Eating daily meals that are high in calories, sugar, and fat. Drinking high amounts of sugar-sweetened beverages, such as soft drinks. Being born with genes that may make you more likely to become obese. Having a medical condition that causes obesity, including: Hypothyroidism. Polycystic ovarian syndrome (PCOS). Binge-eating disorder. Cushing syndrome. Taking certain medicines, such as steroids, antidepressants, and seizure medicines. Not being physically active (sedentary lifestyle). Not getting enough sleep. What increases the risk? The following factors may make you more likely to develop this condition: Having a family history of obesity. Living in an area with limited access to: [place], recreation centers, or sidewalks. Healthy food choices, such as grocery stores and farmers' markets. What are the signs or symptoms? The main sign of this condition is having too much body fat. How is this diagnosed? This condition is diagnosed based on: Your BMI. If you are an adult with a BMI of 30 or higher, you are considered obese. Your waist circumference. This measures the  distance around your waistline. Your skinfold thickness. Your health care provider may gently pinch a fold of your skin and measure it. You may have other tests to check for underlying conditions. How is this treated? Treatment for this condition often includes changing your lifestyle. Treatment may include some or all of the following: Dietary changes. This may include developing a healthy meal plan. Regular physical activity. This may include activity that causes your heart to beat faster (aerobic exercise) and strength training. Work with your health care provider to design an exercise program that works for you. Medicine to help you lose weight if you are unable to lose one pound a week after six weeks of healthy eating and more physical activity. Treating conditions that cause the obesity (underlying conditions). Surgery. Surgical options may include gastric banding and gastric bypass. Surgery may be done if: Other treatments have not helped to improve your condition. You have a BMI of 40 or higher. You have life-threatening health problems related to obesity. Follow these instructions at home: Eating and drinking  Follow recommendations from your health care provider about what you eat and drink. Your health care provider may advise you to: Limit fast food, sweets, and processed snack foods. Choose low-fat options, such as low-fat milk instead of whole milk. Eat five or more servings of fruits or vegetables every day. Choose healthy foods when you eat out. Keep low-fat snacks available. Limit sugary drinks, such as soda, fruit juice, sweetened iced tea, and flavored milk. Drink enough water to keep your urine pale yellow. Do not follow a fad diet. Fad diets can be unhealthy and even dangerous. Other healthful choices include: Eat at home more often. This gives you more control over what you eat. Learn to read food labels.  This will help you understand how much food is considered one  serving. Learn what a healthy serving size is. Physical activity Exercise regularly, as told by your health care provider. Most adults should get up to 150 minutes of moderate-intensity exercise every week. Ask your health care provider what types of exercise are safe for you and how often you should exercise. Warm up and stretch before being active. Cool down and stretch after being active. Rest between periods of activity. Lifestyle Work with your health care provider and a dietitian to set a weight-loss goal that is healthy and reasonable for you. Limit your screen time. Find ways to reward yourself that do not involve food. Do not drink alcohol if: Your health care provider tells you not to drink. You are pregnant, may be pregnant, or are planning to become pregnant. If you drink alcohol: Limit how much you have to: 0-1 drink a day for women. 0-2 drinks a day for men. Know how much alcohol is in your drink. In the [place] one drink equals one 12 oz bottle of beer (355 mL), one 5 oz glass of wine (148 mL), or one 1 oz glass of hard liquor (44 mL). General instructions Keep a weight-loss journal to keep track of the food you eat and how much exercise you get. Take over-the-counter and prescription medicines only as told by your health care provider. Take vitamins and supplements only as told by your health care provider. Consider joining a support group. Your health care provider may be able to recommend a support group. Pay attention to your mental health as obesity can lead to depression or self esteem issues. Keep all follow-up visits. This is important. Contact a health care provider if: You are unable to meet your weight-loss goal after six weeks of dietary and lifestyle changes. You have trouble breathing. Summary Obesity is the condition of having too much total body fat. Being overweight or obese means that your weight is greater than what is considered healthy for your body  size. Work with your health care provider and a dietitian to set a weight-loss goal that is healthy and reasonable for you. Exercise regularly, as told by your health care provider. Ask your health care provider what types of exercise are safe for you and how often you should exercise. This information is not intended to replace advice given to you by your health care provider. Make sure you discuss any questions you have with your health care provider. Document Revised: 10/11/2020 Document Reviewed: 10/11/2020 Elsevier Patient Education  [place].

## 2021-02-22 DIAGNOSIS — Z20822 Contact with and (suspected) exposure to covid-19: Secondary | ICD-10-CM | POA: Diagnosis not present

## 2021-02-23 DIAGNOSIS — M4310 Spondylolisthesis, site unspecified: Secondary | ICD-10-CM | POA: Diagnosis not present

## 2021-03-21 ENCOUNTER — Encounter (INDEPENDENT_AMBULATORY_CARE_PROVIDER_SITE_OTHER): Payer: Self-pay | Admitting: Ophthalmology

## 2021-03-21 ENCOUNTER — Other Ambulatory Visit: Payer: Self-pay

## 2021-03-21 ENCOUNTER — Ambulatory Visit (INDEPENDENT_AMBULATORY_CARE_PROVIDER_SITE_OTHER): Payer: Medicare Other | Admitting: Ophthalmology

## 2021-03-21 DIAGNOSIS — H353221 Exudative age-related macular degeneration, left eye, with active choroidal neovascularization: Secondary | ICD-10-CM

## 2021-03-21 MED ORDER — RANIBIZUMAB 0.5 MG/0.05ML IZ SOSY
0.5000 mg | PREFILLED_SYRINGE | INTRAVITREAL | Status: AC | PRN
  Administered 2021-03-21: .5 mg via INTRAVITREAL

## 2021-03-21 NOTE — Assessment & Plan Note (Signed)
The nature of wet macular degeneration was discussed with the patient.  Forms of therapy reviewed include the use of Anti-VEGF medications injected painlessly into the eye, as well as other possible treatment modalities, including thermal laser therapy. Fellow eye involvement and risks were discussed with the patient. Upon the finding of wet age related macular degeneration, treatment will be offered. The treatment regimen is on a treat as needed basis with the intent to treat if necessary and extend interval of exams when possible. On average 1 out of 6 patients do not need lifetime therapy. However, the risk of recurrent disease is high for a lifetime.  Initially monthly, then periodic, examinations and evaluations will determine whether the next treatment is required on the day of the examination.  OS, with subretinal fluid chronic as result of vascularized PED.  Currently at 6-week follow-up interval on Lucentis.  Stable acuity.  We will repeat injection today and examination next in 6 weeks

## 2021-03-21 NOTE — Progress Notes (Signed)
03/21/2021     CHIEF COMPLAINT Patient presents for  Chief Complaint  Patient presents with   Retina Follow Up      HISTORY OF PRESENT ILLNESS: [name] is a 76 y.o. female who presents to the clinic today for:   HPI     Retina Follow Up           Diagnosis: Wet AMD   Laterality: left eye   Onset: 6 weeks ago   Severity: mild   Duration: 6 weeks   Course: stable         Comments   6 week for dilate, OS, Lucentis 0.5 and OCT  Pt states VA OU stable since last visit. Pt denies FOL, floaters, or ocular pain OU.  Pt states, "after my last injection my left eye got really red and puffy in the white part and it lasted for a long time. My eye was really bothering me."   Pt reports using Combigan BID OU       Last edited by Kendra Opitz, COA on 03/21/2021  9:53 AM.      Referring physician: Ferd Hibbs, NP [place],  [place] [address]  HISTORICAL INFORMATION:   Selected notes from the MEDICAL RECORD NUMBER    Lab Results  Component Value Date   HGBA1C 7.0 (H) 04/14/2020     CURRENT MEDICATIONS: Current Outpatient Medications (Ophthalmic Drugs)  Medication Sig   Brimonidine Tartrate-Timolol (COMBIGAN OP) Apply 1 drop to eye 2 (two) times daily. In both eyes.   No current facility-administered medications for this visit. (Ophthalmic Drugs)   Current Outpatient Medications (Other)  Medication Sig   aspirin 81 MG tablet Take 81 mg by mouth daily.   benzonatate (TESSALON) 100 MG capsule Take 1-2 capsules (100-200 mg total) by mouth 3 (three) times daily as needed for cough.   cholecalciferol (VITAMIN D) 1000 units tablet Take 1,000 Units by mouth daily.   fluticasone (FLONASE) 50 MCG/ACT nasal spray Place 1 spray into both nostrils daily.   fluticasone (FLOVENT HFA) 44 MCG/ACT inhaler Inhale 2 puffs into the lungs 2 (two) times daily. With spacer.   glucose blood test strip Use as instructed   loratadine (CLARITIN) 10 MG tablet  Take 1 tablet (10 mg total) by mouth daily.   metFORMIN (GLUCOPHAGE) 500 MG tablet Take 1 tablet (500 mg total) by mouth daily with breakfast. (Patient taking differently: Take 500 mg by mouth 3 (three) times daily.)   metoprolol succinate (TOPROL-XL) 25 MG 24 hr tablet Take 1 tablet by mouth once daily   simvastatin (ZOCOR) 20 MG tablet TAKE 1 TABLET BY MOUTH AT BEDTIME   Turmeric Curcumin 500 MG CAPS Take by mouth.   vitamin C (ASCORBIC ACID) 500 MG tablet Take 500 mg by mouth 2 (two) times daily.    Zinc 50 MG TABS    Current Facility-Administered Medications (Other)  Medication Route   EPINEPHrine (Anaphylaxis) SOLN 0.3 mg Intramuscular      REVIEW OF SYSTEMS:    ALLERGIES Allergies  Allergen Reactions   Peanut (Diagnostic) Cough, Hypertension and Shortness Of Breath   Adhesive [Tape] Rash   Atorvastatin Other (See Comments)    Myalgia   Crestor [Rosuvastatin] Other (See Comments)    Severe arthralgia and myalgia   Latex Itching and Other (See Comments)    Causes blisters   Lovastatin Other (See Comments)    Arthralgias, hyperglycemia with lovastatin.    PAST MEDICAL HISTORY Past Medical  History:  Diagnosis Date   Allergic rhinitis 03/23/2013   Allergy    Anemia    due to vaginal bleeding   Arthritis    hands   Asthmatic bronchitis 09/03/2017   Breast cancer ([place]) 08/2012   left   Dental crowns present    Eczema    Fibroids    Headache(784.0)    tension   History of endometriosis    Hordeolum externum right upper eyelid 07/25/2020   New finding OD today not noticed by the patient prior   Hyperlipidemia    Hypertension    under control with med., has been on med. x 3 yr.   Nuclear sclerotic cataract of right eye 12/02/2019   OSA (obstructive sleep apnea)    Postmenopausal bleeding    Recurrent upper respiratory infection (URI)    S/P radiation therapy 10/20/2012-12/05/2012   left breast 50.4 gray, lumpectomy cavity boosted to 62.4 gray   Seasonal  allergies    Sleep apnea    uses CPAP nightly   Spinal stenosis    Urinary urgency    White coat hypertension    Past Surgical History:  Procedure Laterality Date   BACK SURGERY  1997   lumbar   BREAST LUMPECTOMY WITH NEEDLE LOCALIZATION AND AXILLARY SENTINEL LYMPH NODE BX Left 09/03/2012   Procedure: BREAST LUMPECTOMY WITH NEEDLE LOCALIZATION AND AXILLARY SENTINEL LYMPH NODE BX;  Surgeon: Edward Jolly, MD;  Location: [facility];  Service: General;  Laterality: Left;   CHOLECYSTECTOMY  1995   COLONOSCOPY W/ POLYPECTOMY  03/20/2007   DILATION AND CURETTAGE OF UTERUS  2005   DILATION AND CURETTAGE OF UTERUS  06/22/2011   Procedure: DILATATION AND CURETTAGE;  Surgeon: Alvino Chapel, MD;  Location: [facility];  Service: Gynecology;  Laterality: N/A;  OK PER KEELA FOR 7:15 START   DORSAL COMPARTMENT RELEASE Right 05/19/2013   Procedure: RELEASE 1ST  DORSAL COMPARTMENT RIGHT (DEQUERVAIN);  Surgeon: Cammie Sickle., MD;  Location[facility];  Service: Orthopedics;  Laterality: Right;   EXPLORATORY LAPAROTOMY  06-30-2007   ATTEMPTED HYSTERECTOMY ABORTED DUE TO EXTENSIVE ENDOMETRIOSIS W/ DENSE PELVIC ADHESIONS INVOLVING UTERUS AND LOWER RECTOSIGMOID   HYSTEROSCOPY WITH D & C  05/06/2009    FAMILY HISTORY Family History  Problem Relation Age of Onset   Vision loss Mother    Hypertension Mother    Anesthesia problems Mother        post-op N/V   Heart disease Mother        carotid stenosis s/p CAE   Osteoporosis Mother    Eczema Mother    Depression Father    Heart disease Sister 22       arrhythmia   Hypertension Sister    Hyperlipidemia Sister    Diabetes Brother    Hypertension Brother    Cancer Brother        skin   Hyperlipidemia Brother    Stroke Brother    Kidney failure Maternal Grandmother    Heart disease Maternal Grandfather    COPD Daughter    Atopy Daughter    Cancer Cousin    Hypertension Other    Colon  cancer Neg Hx    Allergic rhinitis Neg Hx    Asthma Neg Hx    Urticaria Neg Hx     SOCIAL HISTORY Social History   Tobacco Use   Smoking status: Never   Smokeless tobacco: Never  Vaping Use   Vaping Use: Never  used  Substance Use Topics   Alcohol use: No    Alcohol/week: 0.0 standard drinks   Drug use: No         OPHTHALMIC EXAM:  Base Eye Exam     Visual Acuity (ETDRS)       Right Left   [place] 20/20 20/20         Tonometry (Tonopen, 9:52 AM)       Right Left   Pressure 14 15         Pupils       Pupils React APD   Right PERRL Brisk None   Left PERRL Brisk None         Visual Fields (Counting fingers)       Left Right    Full Full         Extraocular Movement       Right Left    Full, Ortho Full, Ortho         Neuro/Psych     Oriented x3: Yes   Mood/Affect: Normal         Dilation     Left eye: 1.0% Mydriacyl, 2.5% Phenylephrine @ 9:52 AM           Slit Lamp and Fundus Exam     External Exam       Right Left   External Normal Normal         Slit Lamp Exam       Right Left   Lids/Lashes Normal Normal   Conjunctiva/Sclera White and quiet White and quiet   Cornea Clear Clear   Anterior Chamber Deep and quiet Deep and quiet   Iris Round and reactive Round and reactive   Lens Centered posterior chamber intraocular lens Centered posterior chamber intraocular lens   Anterior Vitreous Normal Normal         Fundus Exam       Right Left   Posterior Vitreous  Posterior vitreous detachment?, Vitreous debris, no Weiss ring the   Disc  Peripapillary atrophy   C/D Ratio  0.25   Macula  Retinal pigment epithelial mottling, no exudates, no hemorrhage, macular thickening, Early age related macular degeneration, Pigmented atrophy, nonturbid subretinal fluid   Vessels  Normal, , no DR   Periphery  Normal            IMAGING AND PROCEDURES  Imaging and Procedures for 03/21/21  OCT, Retina - OU - Both Eyes        Right Eye Quality was good. Scan locations included subfoveal. Central Foveal Thickness: 259. Progression has been stable. Findings include retinal drusen , no IRF, no SRF.   Left Eye Quality was good. Scan locations included subfoveal. Central Foveal Thickness: 279. Progression has been stable. Findings include subretinal fluid, pigment epithelial detachment, no IRF.   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  Repeat injection today intravitreal Lucentis, no clear delineation of VMA or PVD OS  OD, incidental partial PVD with VMA to macula remained     Intravitreal Injection, Pharmacologic Agent - OS - Left Eye       Time Out 03/21/2021. 10:43 AM. Confirmed correct patient, procedure, site, and patient consented.   Anesthesia Topical anesthesia was used. Anesthetic medications included [place] 3.5%.   Procedure Preparation included Tobramycin 0.3%, 5% betadine to ocular surface, 10% betadine to eyelids. A 30 gauge needle was used.  Injection: 0.5 mg Ranibizumab 0.5 MG/0.05ML   Route: Intravitreal, Site: Left Eye   NDC: [id number], Lot: [id number], Waste: 0 mL   Post-op Post injection exam found visual acuity of at least counting fingers. The patient tolerated the procedure well. There were no complications. The patient received written and verbal post procedure care education. Post injection medications included ocuflox.              ASSESSMENT/PLAN:  Exudative age-related macular degeneration of left eye with active choroidal neovascularization (HCC) The nature of wet macular degeneration was discussed with the patient.  Forms of therapy reviewed include the use of Anti-VEGF medications injected painlessly into the eye, as well as other possible treatment modalities, including thermal laser therapy. Fellow eye involvement and risks were  discussed with the patient. Upon the finding of wet age related macular degeneration, treatment will be offered. The treatment regimen is on a treat as needed basis with the intent to treat if necessary and extend interval of exams when possible. On average 1 out of 6 patients do not need lifetime therapy. However, the risk of recurrent disease is high for a lifetime.  Initially monthly, then periodic, examinations and evaluations will determine whether the next treatment is required on the day of the examination.  OS, with subretinal fluid chronic as result of vascularized PED.  Currently at 6-week follow-up interval on Lucentis.  Stable acuity.  We will repeat injection today and examination next in 6 weeks     ICD-10-CM   1. Exudative age-related macular degeneration of left eye with active choroidal neovascularization (HCC)  H35.3221 OCT, Retina - OU - Both Eyes    Intravitreal Injection, Pharmacologic Agent - OS - Left Eye    Ranibizumab SOSY 0.5 mg      1.  OS we will continue with therapy today intravitreal Lucentis.  Controlling subretinal fluid from wet AMD.  2.  No discernible sign of PVD OS as thus possible role of VMT or VMA elevating this macula we will continue to monitor for signs of PVD developing  3.  Ophthalmic Meds Ordered this visit:  Meds ordered this encounter  Medications   Ranibizumab SOSY 0.5 mg       Return in about 6 weeks (around 05/02/2021) for DILATE OU, LUCENTIS 0.5 OCT, OS.  There are no Patient Instructions on file for this visit.   Explained the diagnoses, plan, and follow up with the patient and they expressed understanding.  Patient expressed understanding of the importance of proper follow up care.   Clent Demark Keonta Monceaux M.D. Diseases & Surgery of the Retina and Vitreous [facility] [place] 03/21/21     Abbreviations: M myopia (nearsighted); A astigmatism; H hyperopia (farsighted); P presbyopia; Mrx spectacle prescription;  CTL contact  lenses; OD right eye; OS left eye; OU both eyes  XT exotropia; ET esotropia; PEK punctate epithelial keratitis; PEE punctate epithelial erosions; DES dry eye syndrome; MGD meibomian gland dysfunction; ATs artificial tears; PFAT's preservative free artificial tears; [place] nuclear sclerotic cataract; PSC posterior subcapsular cataract; ERM epi-retinal membrane; PVD posterior vitreous detachment; RD retinal detachment; DM diabetes mellitus; DR diabetic retinopathy; NPDR non-proliferative diabetic retinopathy; PDR proliferative diabetic retinopathy; CSME clinically significant macular edema; DME diabetic macular edema; dbh dot blot hemorrhages; CWS cotton wool spot; POAG primary open angle glaucoma; C/D cup-to-disc ratio; HVF humphrey visual field; GVF goldmann visual field; OCT optical coherence tomography; IOP intraocular pressure; BRVO Branch retinal vein occlusion; CRVO central retinal vein occlusion; CRAO central retinal  artery occlusion; BRAO branch retinal artery occlusion; RT retinal tear; SB scleral buckle; PPV pars plana vitrectomy; VH Vitreous hemorrhage; PRP panretinal laser photocoagulation; IVK intravitreal kenalog; VMT vitreomacular traction; MH Macular hole;  NVD neovascularization of the disc; NVE neovascularization elsewhere; AREDS age related eye disease study; ARMD age related macular degeneration; POAG primary open angle glaucoma; EBMD epithelial/anterior basement membrane dystrophy; ACIOL anterior chamber intraocular lens; IOL intraocular lens; PCIOL posterior chamber intraocular lens; Phaco/IOL phacoemulsification with intraocular lens placement; [place] photorefractive keratectomy; LASIK laser assisted in situ keratomileusis; HTN hypertension; DM diabetes mellitus; COPD chronic obstructive pulmonary disease

## 2021-05-02 ENCOUNTER — Ambulatory Visit (INDEPENDENT_AMBULATORY_CARE_PROVIDER_SITE_OTHER): Payer: Medicare Other | Admitting: Ophthalmology

## 2021-05-02 ENCOUNTER — Encounter (INDEPENDENT_AMBULATORY_CARE_PROVIDER_SITE_OTHER): Payer: Self-pay | Admitting: Ophthalmology

## 2021-05-02 ENCOUNTER — Other Ambulatory Visit: Payer: Self-pay

## 2021-05-02 DIAGNOSIS — H43822 Vitreomacular adhesion, left eye: Secondary | ICD-10-CM

## 2021-05-02 DIAGNOSIS — H353132 Nonexudative age-related macular degeneration, bilateral, intermediate dry stage: Secondary | ICD-10-CM

## 2021-05-02 DIAGNOSIS — H353221 Exudative age-related macular degeneration, left eye, with active choroidal neovascularization: Secondary | ICD-10-CM | POA: Diagnosis not present

## 2021-05-02 MED ORDER — RANIBIZUMAB 0.5 MG/0.05ML IZ SOSY
0.5000 mg | PREFILLED_SYRINGE | INTRAVITREAL | Status: AC | PRN
  Administered 2021-05-02: .5 mg via INTRAVITREAL

## 2021-05-02 NOTE — Progress Notes (Signed)
05/02/2021     CHIEF COMPLAINT Patient presents for  Chief Complaint  Patient presents with   Macular Degeneration      HISTORY OF PRESENT ILLNESS: [name] is a 76 y.o. female who presents to the clinic today for:   HPI   follow-up in 6 weeks postinjection intravitreal antivegF, Lucentis, for wet AMD, more aware of floaters left eye Last edited by Hurman Horn, MD on 05/02/2021 10:23 AM.      Referring physician: Ferd Hibbs, NP [place],  [place] [address]  HISTORICAL INFORMATION:   Selected notes from the MEDICAL RECORD NUMBER    Lab Results  Component Value Date   HGBA1C 7.0 (H) 04/14/2020     CURRENT MEDICATIONS: Current Outpatient Medications (Ophthalmic Drugs)  Medication Sig   Brimonidine Tartrate-Timolol (COMBIGAN OP) Apply 1 drop to eye 2 (two) times daily. In both eyes.   No current facility-administered medications for this visit. (Ophthalmic Drugs)   Current Outpatient Medications (Other)  Medication Sig   aspirin 81 MG tablet Take 81 mg by mouth daily.   benzonatate (TESSALON) 100 MG capsule Take 1-2 capsules (100-200 mg total) by mouth 3 (three) times daily as needed for cough.   cholecalciferol (VITAMIN D) 1000 units tablet Take 1,000 Units by mouth daily.   fluticasone (FLONASE) 50 MCG/ACT nasal spray Place 1 spray into both nostrils daily.   fluticasone (FLOVENT HFA) 44 MCG/ACT inhaler Inhale 2 puffs into the lungs 2 (two) times daily. With spacer.   glucose blood test strip Use as instructed   loratadine (CLARITIN) 10 MG tablet Take 1 tablet (10 mg total) by mouth daily.   metFORMIN (GLUCOPHAGE) 500 MG tablet Take 1 tablet (500 mg total) by mouth daily with breakfast. (Patient taking differently: Take 500 mg by mouth 3 (three) times daily.)   metoprolol succinate (TOPROL-XL) 25 MG 24 hr tablet Take 1 tablet by mouth once daily   simvastatin (ZOCOR) 20 MG tablet TAKE 1 TABLET BY MOUTH AT BEDTIME   Turmeric Curcumin  500 MG CAPS Take by mouth.   vitamin C (ASCORBIC ACID) 500 MG tablet Take 500 mg by mouth 2 (two) times daily.    Zinc 50 MG TABS    Current Facility-Administered Medications (Other)  Medication Route   EPINEPHrine (Anaphylaxis) SOLN 0.3 mg Intramuscular      REVIEW OF SYSTEMS: ROS   Negative for: Constitutional, Gastrointestinal, Neurological, Skin, Genitourinary, Musculoskeletal, HENT, Endocrine, Cardiovascular, Eyes, Respiratory, Psychiatric, Allergic/Imm, Heme/Lymph Last edited by Hurman Horn, MD on 05/02/2021 10:23 AM.       ALLERGIES Allergies  Allergen Reactions   Peanut (Diagnostic) Cough, Hypertension and Shortness Of Breath   Adhesive [Tape] Rash   Atorvastatin Other (See Comments)    Myalgia   Crestor [Rosuvastatin] Other (See Comments)    Severe arthralgia and myalgia   Latex Itching and Other (See Comments)    Causes blisters   Lovastatin Other (See Comments)    Arthralgias, hyperglycemia with lovastatin.    PAST MEDICAL HISTORY Past Medical History:  Diagnosis Date   Allergic rhinitis 03/23/2013   Allergy    Anemia    due to vaginal bleeding   Arthritis    hands   Asthmatic bronchitis 09/03/2017   Breast cancer ([place]) 08/2012   left   Dental crowns present    Eczema    Fibroids    Headache(784.0)    tension   History of endometriosis    Hordeolum externum right upper  eyelid 07/25/2020   New finding OD today not noticed by the patient prior   Hyperlipidemia    Hypertension    under control with med., has been on med. x 3 yr.   Nuclear sclerotic cataract of right eye 12/02/2019   OSA (obstructive sleep apnea)    Postmenopausal bleeding    Recurrent upper respiratory infection (URI)    S/P radiation therapy 10/20/2012-12/05/2012   left breast 50.4 gray, lumpectomy cavity boosted to 62.4 gray   Seasonal allergies    Sleep apnea    uses CPAP nightly   Spinal stenosis    Urinary urgency    White coat hypertension    Past Surgical History:   Procedure Laterality Date   BACK SURGERY  1997   lumbar   BREAST LUMPECTOMY WITH NEEDLE LOCALIZATION AND AXILLARY SENTINEL LYMPH NODE BX Left 09/03/2012   Procedure: BREAST LUMPECTOMY WITH NEEDLE LOCALIZATION AND AXILLARY SENTINEL LYMPH NODE BX;  Surgeon: Edward Jolly, MD;  Location: [facility];  Service: General;  Laterality: Left;   CHOLECYSTECTOMY  1995   COLONOSCOPY W/ POLYPECTOMY  03/20/2007   DILATION AND CURETTAGE OF UTERUS  2005   DILATION AND CURETTAGE OF UTERUS  06/22/2011   Procedure: DILATATION AND CURETTAGE;  Surgeon: Alvino Chapel, MD;  Location: [facility];  Service: Gynecology;  Laterality: N/A;  OK PER KEELA FOR 7:15 START   DORSAL COMPARTMENT RELEASE Right 05/19/2013   Procedure: RELEASE 1ST  DORSAL COMPARTMENT RIGHT (DEQUERVAIN);  Surgeon: Cammie Sickle., MD;  Location: [facility];  Service: Orthopedics;  Laterality: Right;   EXPLORATORY LAPAROTOMY  06-30-2007   ATTEMPTED HYSTERECTOMY ABORTED DUE TO EXTENSIVE ENDOMETRIOSIS W/ DENSE PELVIC ADHESIONS INVOLVING UTERUS AND LOWER RECTOSIGMOID   HYSTEROSCOPY WITH D & C  05/06/2009    FAMILY HISTORY Family History  Problem Relation Age of Onset   Vision loss Mother    Hypertension Mother    Anesthesia problems Mother        post-op N/V   Heart disease Mother        carotid stenosis s/p CAE   Osteoporosis Mother    Eczema Mother    Depression Father    Heart disease Sister 53       arrhythmia   Hypertension Sister    Hyperlipidemia Sister    Diabetes Brother    Hypertension Brother    Cancer Brother        skin   Hyperlipidemia Brother    Stroke Brother    Kidney failure Maternal Grandmother    Heart disease Maternal Grandfather    COPD Daughter    Atopy Daughter    Cancer Cousin    Hypertension Other    Colon cancer Neg Hx    Allergic rhinitis Neg Hx    Asthma Neg Hx    Urticaria Neg Hx     SOCIAL HISTORY Social History   Tobacco Use    Smoking status: Never   Smokeless tobacco: Never  Vaping Use   Vaping Use: Never used  Substance Use Topics   Alcohol use: No    Alcohol/week: 0.0 standard drinks   Drug use: No         OPHTHALMIC EXAM:  Base Eye Exam     Visual Acuity (ETDRS)       Right Left   Dist [place] 20/25 +1 20/20 -1   Dist ph  20/20          Tonometry (Tonopen, 10:26  AM)       Right Left   Pressure 14 11         Pupils       Pupils APD   Right PERRL None   Left PERRL None         Visual Fields       Left Right    Full Full         Extraocular Movement       Right Left    Full, Ortho Full, Ortho         Neuro/Psych     Oriented x3: Yes   Mood/Affect: Normal         Dilation     Left eye: 1.0% Mydriacyl, 2.5% Phenylephrine @ 10:24 AM           Slit Lamp and Fundus Exam     External Exam       Right Left   External Normal Normal         Slit Lamp Exam       Right Left   Lids/Lashes Normal Normal   Conjunctiva/Sclera White and quiet White and quiet   Cornea Clear Clear   Anterior Chamber Deep and quiet Deep and quiet   Iris Round and reactive Round and reactive   Lens Centered posterior chamber intraocular lens Centered posterior chamber intraocular lens   Anterior Vitreous Normal Normal         Fundus Exam       Right Left   Posterior Vitreous  Posterior vitreous detachment?, Vitreous debris, no Weiss ring the   Disc  Peripapillary atrophy   C/D Ratio  0.25   Macula  Retinal pigment epithelial mottling, no exudates, no hemorrhage, macular thickening, Early age related macular degeneration, Pigmented atrophy, nonturbid subretinal fluid   Vessels  Normal, , no DR   Periphery  Normal            IMAGING AND PROCEDURES  Imaging and Procedures for 05/02/21  OCT, Retina - OU - Both Eyes       Right Eye Quality was good. Scan locations included subfoveal. Central Foveal Thickness: 256. Progression has been stable. Findings include  retinal drusen , no IRF, no SRF.   Left Eye Quality was good. Scan locations included subfoveal. Central Foveal Thickness: 269. Progression has been stable. Findings include subretinal fluid, pigment epithelial detachment, no IRF.   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  Repeat injection today intravitreal Lucentis, no clear delineation of VMA or PVD OS  OD, incidental partial PVD with VMA to macula remained     Intravitreal Injection, Pharmacologic Agent - OS - Left Eye       Time Out 05/02/2021. 11:29 AM. Confirmed correct patient, procedure, site, and patient consented.   Anesthesia Topical anesthesia was used. Anesthetic medications included Akten 3.5%.   Procedure Preparation included Tobramycin 0.3%, 5% betadine to ocular surface, 10% betadine to eyelids. A 30 gauge needle was used.   Injection: 0.5 mg Ranibizumab 0.5 MG/0.05ML   Route: Intravitreal, Site: Left Eye   NDC: [id number], Lot: [id number], Waste: 0 mL   Post-op Post injection exam found visual acuity of at least counting fingers. The patient tolerated the procedure well. There were no complications. The patient received written and verbal post procedure care education. Post injection medications included ocuflox.  ASSESSMENT/PLAN:  Exudative age-related macular degeneration of left eye with active choroidal neovascularization (HCC) OS, with chronic subretinal fluid, slightly improved today at 6-week interval.  On Lucentis.  We will repeat injection evaluation in 5 weeks  Intermediate stage nonexudative age-related macular degeneration of both eyes No sign of CNVM OD  Vitreomacular adhesion of left eye Physiologic OS     ICD-10-CM   1. Exudative age-related macular degeneration of left eye with active choroidal neovascularization (HCC)  H35.3221 OCT, Retina -  OU - Both Eyes    Intravitreal Injection, Pharmacologic Agent - OS - Left Eye    Ranibizumab SOSY 0.5 mg    2. Intermediate stage nonexudative age-related macular degeneration of both eyes  H35.3132     3. Vitreomacular adhesion of left eye  H43.822       1.  OS with stable acuity, chronic subretinal fluid remains, slightly improved at 6-week interval on Lucentis.  Repeat injection today reevaluate next in 5 weeks  2.  3.  Ophthalmic Meds Ordered this visit:  Meds ordered this encounter  Medications   Ranibizumab SOSY 0.5 mg       Return in about 5 weeks (around 06/06/2021) for dilate, OS, LUCENTIS 0.5 OCT.  There are no Patient Instructions on file for this visit.   Explained the diagnoses, plan, and follow up with the patient and they expressed understanding.  Patient expressed understanding of the importance of proper follow up care.   Clent Demark Ruble Buttler M.D. Diseases & Surgery of the Retina and Vitreous [facility] [place] 05/02/21     Abbreviations: M myopia (nearsighted); A astigmatism; H hyperopia (farsighted); P presbyopia; Mrx spectacle prescription;  CTL contact lenses; OD right eye; OS left eye; OU both eyes  XT exotropia; ET esotropia; PEK punctate epithelial keratitis; PEE punctate epithelial erosions; DES dry eye syndrome; MGD meibomian gland dysfunction; ATs artificial tears; PFAT's preservative free artificial tears; [place] nuclear sclerotic cataract; PSC posterior subcapsular cataract; ERM epi-retinal membrane; PVD posterior vitreous detachment; RD retinal detachment; DM diabetes mellitus; DR diabetic retinopathy; NPDR non-proliferative diabetic retinopathy; PDR proliferative diabetic retinopathy; CSME clinically significant macular edema; DME diabetic macular edema; dbh dot blot hemorrhages; CWS cotton wool spot; POAG primary open angle glaucoma; C/D cup-to-disc ratio; HVF humphrey visual field; GVF goldmann visual field; OCT optical coherence tomography;  IOP intraocular pressure; BRVO Branch retinal vein occlusion; CRVO central retinal vein occlusion; CRAO central retinal artery occlusion; BRAO branch retinal artery occlusion; RT retinal tear; SB scleral buckle; PPV pars plana vitrectomy; VH Vitreous hemorrhage; PRP panretinal laser photocoagulation; IVK intravitreal kenalog; VMT vitreomacular traction; MH Macular hole;  NVD neovascularization of the disc; NVE neovascularization elsewhere; AREDS age related eye disease study; ARMD age related macular degeneration; POAG primary open angle glaucoma; EBMD epithelial/anterior basement membrane dystrophy; ACIOL anterior chamber intraocular lens; IOL intraocular lens; PCIOL posterior chamber intraocular lens; Phaco/IOL phacoemulsification with intraocular lens placement; [place] photorefractive keratectomy; LASIK laser assisted in situ keratomileusis; HTN hypertension; DM diabetes mellitus; COPD chronic obstructive pulmonary disease

## 2021-05-02 NOTE — Assessment & Plan Note (Signed)
OS, with chronic subretinal fluid, slightly improved today at 6-week interval.  On Lucentis.  We will repeat injection evaluation in 5 weeks

## 2021-05-02 NOTE — Assessment & Plan Note (Signed)
Physiologic OS 

## 2021-05-02 NOTE — Assessment & Plan Note (Signed)
No sign of CNVM OD 

## 2021-05-10 ENCOUNTER — Other Ambulatory Visit: Payer: Self-pay | Admitting: Neurological Surgery

## 2021-05-17 ENCOUNTER — Other Ambulatory Visit: Payer: Self-pay | Admitting: Neurological Surgery

## 2021-06-02 NOTE — Pre-Procedure Instructions (Signed)
Surgical Instructions ? ? ? Your procedure is scheduled on Wednesday, March 23rd. ? Report to [facility] Main Entrance "A" at 9:30 A.M., then check in with the Admitting office. ? Call this number if you have problems the morning of surgery: ? [phone] ? ? If you have any questions prior to your surgery date call [phone]: Open Monday-Friday 8am-4pm ? ? ? Remember: ? Do not eat or drink after midnight the night before your surgery ? ? ? Take these medicines the morning of surgery with A SIP OF WATER:  ?Brimonidine Tartrate-Timolol (COMBIGAN OP) ?fluticasone (FLONASE) ?loratadine (CLARITIN)  ?metoprolol succinate (TOPROL-XL)  ?Eye drops-as needed ?albuterol (VENTOLIN HFA)-bring inhaler with you to the hospital.  ? ?Follow your surgeon's instructions on when to stop Aspirin.  If no instructions were given by your surgeon then you will need to call the office to get those instructions.   ? ?As of today, STOP taking any Aleve, Naproxen, Ibuprofen, Motrin, Advil, Goody's, BC's, all herbal medications, fish oil, and all vitamins. ? ?WHAT DO I DO ABOUT MY DIABETES MEDICATION? ? ? ?Do not take metFORMIN (GLUCOPHAGE) the morning of surgery. ? ? ? ?HOW TO MANAGE YOUR DIABETES ?BEFORE AND AFTER SURGERY ? ?Why is it important to control my blood sugar before and after surgery? ?Improving blood sugar levels before and after surgery helps healing and can limit problems. ?A way of improving blood sugar control is eating a healthy diet by: ? Eating less sugar and carbohydrates ? Increasing activity/exercise ? Talking with your doctor about reaching your blood sugar goals ?High blood sugars (greater than 180 mg/dL) can raise your risk of infections and slow your recovery, so you will need to focus on controlling your diabetes during the weeks before surgery. ?Make sure that the doctor who takes care of your diabetes knows about your planned surgery including the date and location. ? ?How do I manage my blood sugar before  surgery? ?Check your blood sugar at least 4 times a day, starting 2 days before surgery, to make sure that the level is not too high or low. ? ?Check your blood sugar the morning of your surgery when you wake up and every 2 hours until you get to the Short Stay unit. ? ?If your blood sugar is less than 70 mg/dL, you will need to treat for low blood sugar: ?Do not take insulin. ?Treat a low blood sugar (less than 70 mg/dL) with ? cup of clear juice (cranberry or apple), 4 glucose tablets, OR glucose gel. ?Recheck blood sugar in 15 minutes after treatment (to make sure it is greater than 70 mg/dL). If your blood sugar is not greater than 70 mg/dL on recheck, call [phone] for further instructions. ?Report your blood sugar to the short stay nurse when you get to Short Stay. ? ?If you are admitted to the hospital after surgery: ?Your blood sugar will be checked by the staff and you will probably be given insulin after surgery (instead of oral diabetes medicines) to make sure you have good blood sugar levels. ?The goal for blood sugar control after surgery is 80-180 mg/dL. ? ?         ?Do not wear jewelry or makeup ?Do not wear lotions, powders, perfumes, or deodorant. ?Do not shave 48 hours prior to surgery.  ?Do not bring valuables to the hospital. ?Do not wear nail polish, gel polish, artificial nails, or any other type of covering on natural nails (fingers and toes) ?If you have artificial  nails or gel coating that need to be removed by a nail salon, please have this removed prior to surgery. Artificial nails or gel coating may interfere with anesthesia's ability to adequately monitor your vital signs. ? ?[facility] is not responsible for any belongings or valuables. .  ? ?Do NOT Smoke (Tobacco/Vaping)  24 hours prior to your procedure ? ?If you use a CPAP at night, you may bring your mask for your overnight stay. ?  ?Contacts, glasses, hearing aids, dentures or partials may not be worn into surgery, please  bring cases for these belongings ?  ?For patients admitted to the hospital, discharge time will be determined by your treatment team. ?  ?Patients discharged the day of surgery will not be allowed to drive home, and someone needs to stay with them for 24 hours. ? ?NO VISITORS WILL BE ALLOWED IN PRE-OP WHERE PATIENTS ARE PREPPED FOR SURGERY.  ONLY 1 SUPPORT PERSON MAY BE PRESENT IN THE WAITING ROOM WHILE YOU ARE IN SURGERY.  IF YOU ARE TO BE ADMITTED, ONCE YOU ARE IN YOUR ROOM YOU WILL BE ALLOWED TWO (2) VISITORS. 1 (ONE) VISITOR MAY STAY OVERNIGHT BUT MUST ARRIVE TO THE ROOM BY 8pm.  Minor children may have two parents present. Special consideration for safety and communication needs will be reviewed on a case by case basis. ? ?Special instructions:   ? ?Oral Hygiene is also important to reduce your risk of infection.  Remember - BRUSH YOUR TEETH THE MORNING OF SURGERY WITH YOUR REGULAR TOOTHPASTE ? ? ?[place]- Preparing For Surgery ? ?Before surgery, you can play an important role. Because skin is not sterile, your skin needs to be as free of germs as possible. You can reduce the number of germs on your skin by washing with CHG (chlorahexidine gluconate) Soap before surgery.  CHG is an antiseptic cleaner which kills germs and bonds with the skin to continue killing germs even after washing.   ? ? ?Please do not use if you have an allergy to CHG or antibacterial soaps. If your skin becomes reddened/irritated stop using the CHG.  ?Do not shave (including legs and underarms) for at least 48 hours prior to first CHG shower. It is OK to shave your face. ? ?Please follow these instructions carefully. ?  ? ? Shower the NIGHT BEFORE SURGERY and the MORNING OF SURGERY with CHG Soap.  ? If you chose to wash your hair, wash your hair first as usual with your normal shampoo. After you shampoo, rinse your hair and body thoroughly to remove the shampoo.  Then [employer] and genitals (private parts) with your normal soap and  rinse thoroughly to remove soap. ? ?After that Use CHG Soap as you would any other liquid soap. You can apply CHG directly to the skin and wash gently with a scrungie or a clean washcloth.  ? ?Apply the CHG Soap to your body ONLY FROM THE NECK DOWN.  Do not use on open wounds or open sores. Avoid contact with your eyes, ears, mouth and genitals (private parts). Wash Face and genitals (private parts)  with your normal soap.  ? ?Wash thoroughly, paying special attention to the area where your surgery will be performed. ? ?Thoroughly rinse your body with warm water from the neck down. ? ?DO NOT shower/wash with your normal soap after using and rinsing off the CHG Soap. ? ?Pat yourself dry with a CLEAN TOWEL. ? ?Wear CLEAN PAJAMAS to bed the night before surgery ? ?  Place CLEAN SHEETS on your bed the night before your surgery ? ?DO NOT SLEEP WITH PETS. ? ? ?Day of Surgery: ? ?Take a shower with CHG soap. ?Wear Clean/Comfortable clothing the morning of surgery ?Do not apply any deodorants/lotions.   ?Remember to brush your teeth WITH YOUR REGULAR TOOTHPASTE. ? ? ? ?Notify your provider: ?if you are in close contact with someone who has COVID  ?or if you develop a fever of 100.4 or greater, sneezing, cough, sore throat, shortness of breath or body aches. ? ?  ?Please read over the following fact sheets that you were given.  ? ?

## 2021-06-05 ENCOUNTER — Other Ambulatory Visit: Payer: Self-pay

## 2021-06-05 ENCOUNTER — Encounter (HOSPITAL_COMMUNITY): Payer: Self-pay

## 2021-06-05 ENCOUNTER — Encounter (HOSPITAL_COMMUNITY)
Admission: RE | Admit: 2021-06-05 | Discharge: 2021-06-05 | Disposition: A | Discharge status: Home / Self Care | Payer: Medicare Other | Source: Ambulatory Visit | Attending: Neurological Surgery | Admitting: Neurological Surgery

## 2021-06-05 VITALS — BP 164/85 | HR 80 | Temp 97.6°F | Resp 18 | Ht 60.0 in | Wt 218.2 lb

## 2021-06-05 DIAGNOSIS — Z9989 Dependence on other enabling machines and devices: Secondary | ICD-10-CM | POA: Diagnosis not present

## 2021-06-05 DIAGNOSIS — Z01812 Encounter for preprocedural laboratory examination: Secondary | ICD-10-CM | POA: Insufficient documentation

## 2021-06-05 DIAGNOSIS — I10 Essential (primary) hypertension: Secondary | ICD-10-CM | POA: Insufficient documentation

## 2021-06-05 DIAGNOSIS — E118 Type 2 diabetes mellitus with unspecified complications: Secondary | ICD-10-CM | POA: Insufficient documentation

## 2021-06-05 DIAGNOSIS — G4733 Obstructive sleep apnea (adult) (pediatric): Secondary | ICD-10-CM | POA: Diagnosis not present

## 2021-06-05 DIAGNOSIS — I471 Supraventricular tachycardia: Secondary | ICD-10-CM | POA: Diagnosis not present

## 2021-06-05 DIAGNOSIS — E785 Hyperlipidemia, unspecified: Secondary | ICD-10-CM | POA: Insufficient documentation

## 2021-06-05 DIAGNOSIS — I272 Pulmonary hypertension, unspecified: Secondary | ICD-10-CM | POA: Insufficient documentation

## 2021-06-05 DIAGNOSIS — Z01818 Encounter for other preprocedural examination: Secondary | ICD-10-CM

## 2021-06-05 HISTORY — DX: Type 2 diabetes mellitus without complications: E11.9

## 2021-06-05 HISTORY — DX: Supraventricular tachycardia, unspecified: I47.10

## 2021-06-05 HISTORY — DX: Unspecified glaucoma: H40.9

## 2021-06-05 HISTORY — DX: Supraventricular tachycardia: I47.1

## 2021-06-05 LAB — BASIC METABOLIC PANEL
Anion gap: 9 (ref 5–15)
BUN: 12 mg/dL (ref 8–23)
CO2: 28 mmol/L (ref 22–32)
Calcium: 10.1 mg/dL (ref 8.9–10.3)
Chloride: 103 mmol/L (ref 98–111)
Creatinine, Ser: 0.84 mg/dL (ref 0.44–1.00)
GFR, Estimated: >60 mL/min (ref >60)
Glucose, Bld: 142 mg/dL — ABNORMAL HIGH (ref 70–99)
Potassium: 4.2 mmol/L (ref 3.5–5.1)
Sodium: 140 mmol/L (ref 135–145)

## 2021-06-05 LAB — TYPE AND SCREEN
ABO/RH(D): O POS
Antibody Screen: NEGATIVE

## 2021-06-05 LAB — CBC
HCT: 37.2 % (ref 36.0–46.0)
Hemoglobin: 12.2 g/dL (ref 12.0–15.0)
MCH: 29.2 pg (ref 26.0–34.0)
MCHC: 32.8 g/dL (ref 30.0–36.0)
MCV: 89 fL (ref 80.0–100.0)
Platelets: 250 10*3/uL (ref 150–400)
RBC: 4.18 MIL/uL (ref 3.87–5.11)
RDW: 13.1 % (ref 11.5–15.5)
WBC: 7.2 10*3/uL (ref 4.0–10.5)
nRBC: 0 % (ref 0.0–0.2)

## 2021-06-05 LAB — SURGICAL PCR SCREEN
MRSA, PCR: POSITIVE — AB
Staphylococcus aureus: POSITIVE — AB

## 2021-06-05 LAB — GLUCOSE, CAPILLARY: Glucose-Capillary: 138 mg/dL — ABNORMAL HIGH (ref 70–99)

## 2021-06-05 NOTE — Progress Notes (Signed)
PCP - Ferd Hibbs NP ?Cardiologist - released by Dr. Einar Gip 10/2020 ? ?PPM/ICD - Denies ?Device Orders -  ?Rep Notified -  ? ?Chest x-ray - na ?EKG - 11/15/20 ?Stress Test - 09/09/17 ?ECHO - 10/06/17 ?Cardiac Cath - none ? ?Sleep Study - 09/2009 ?CPAP - yes ? ?Fasting Blood Sugar -100-110's ?Checks Blood Sugar once a day ? ?Blood Thinner Instructions:n/a ?Aspirin Instructions: pt states her last dose of aspirin was 05/31/21 per MD instructions ? ?ERAS Protcol -no ?PRE-SURGERY Ensure or G2-  ? ?COVID TEST- na ? ? ?Anesthesia review: yes-hx PSVT ? ?Patient denies shortness of breath, fever, cough and chest pain at PAT appointment ? ? ?All instructions explained to the patient, with a verbal understanding of the material. Patient agrees to go over the instructions while at home for a better understanding. Patient also instructed to self quarantine after being tested for COVID-19. The opportunity to ask questions was provided. ?  ?

## 2021-06-06 ENCOUNTER — Encounter (INDEPENDENT_AMBULATORY_CARE_PROVIDER_SITE_OTHER): Payer: Medicare Other | Admitting: Ophthalmology

## 2021-06-06 ENCOUNTER — Encounter (INDEPENDENT_AMBULATORY_CARE_PROVIDER_SITE_OTHER): Payer: Self-pay | Admitting: Ophthalmology

## 2021-06-06 ENCOUNTER — Encounter (INDEPENDENT_AMBULATORY_CARE_PROVIDER_SITE_OTHER): Payer: Self-pay

## 2021-06-06 ENCOUNTER — Ambulatory Visit (INDEPENDENT_AMBULATORY_CARE_PROVIDER_SITE_OTHER): Payer: Medicare Other | Admitting: Ophthalmology

## 2021-06-06 DIAGNOSIS — H353221 Exudative age-related macular degeneration, left eye, with active choroidal neovascularization: Secondary | ICD-10-CM

## 2021-06-06 DIAGNOSIS — H35722 Serous detachment of retinal pigment epithelium, left eye: Secondary | ICD-10-CM | POA: Diagnosis not present

## 2021-06-06 MED ORDER — RANIBIZUMAB 0.5 MG/0.05ML IZ SOSY
0.5000 mg | PREFILLED_SYRINGE | INTRAVITREAL | Status: AC | PRN
  Administered 2021-06-06: .5 mg via INTRAVITREAL

## 2021-06-06 NOTE — Anesthesia Preprocedure Evaluation (Addendum)
Anesthesia Evaluation  ?Patient identified by MRN, date of birth, ID band ?Patient awake ? ? ? ?Reviewed: ?Allergy & Precautions, NPO status , Patient's Chart, lab work & pertinent test results ? ?Airway ?Mallampati: II ? ?TM Distance: >3 FB ?Neck ROM: Full ? ? ? Dental ? ?(+) Dental Advisory Given ?  ?Pulmonary ?asthma , sleep apnea ,  ?  ?breath sounds clear to auscultation ? ? ? ? ? ? Cardiovascular ?hypertension, Pt. on medications and Pt. on home beta blockers ?+ Peripheral Vascular Disease  ? ?Rhythm:Regular Rate:Normal ? ? ?  ?Neuro/Psych ?negative neurological ROS ?   ? GI/Hepatic ?negative GI ROS, Neg liver ROS,   ?Endo/Other  ?diabetes, Type 2, Oral Hypoglycemic Agents ? Renal/GU ?negative Renal ROS  ? ?  ?Musculoskeletal ? ?(+) Arthritis ,  ? Abdominal ?  ?Peds ? Hematology ?negative hematology ROS ?(+)   ?Anesthesia Other Findings ? ? Reproductive/Obstetrics ? ?  ? ? ? ? ? ? ? ? ? ? ? ? ? ?  ?  ? ? ? ? ? ? ? ?Anesthesia Physical ?Anesthesia Plan ? ?ASA: 2 ? ?Anesthesia Plan: General  ? ?Post-op Pain Management: Tylenol PO (pre-op)* and Toradol IV (intra-op)*  ? ?Induction: Intravenous ? ?PONV Risk Score and Plan: 3 and Dexamethasone, Ondansetron and Treatment may vary due to age or medical condition ? ?Airway Management Planned: Oral ETT ? ?Additional Equipment: None ? ?Intra-op Plan:  ? ?Post-operative Plan: Extubation in OR ? ?Informed Consent: I have reviewed the patients History and Physical, chart, labs and discussed the procedure including the risks, benefits and alternatives for the proposed anesthesia with the patient or authorized representative who has indicated his/her understanding and acceptance.  ? ? ? ?Dental advisory given ? ?Plan Discussed with: CRNA ? ?Anesthesia Plan Comments: (PAT note by Karoline Caldwell, PA-C: ?History of PSVT, HTN, HLD, OSA on CPAP.  Stress test 08/2017 was low risk.  Echo 09/2017 showed EF 56%, mild pulmonary hypertension, no significant  valvular abnormalities.  Last seen by Dr. Einar Gip 11/15/2020, doing well at that time from cardiac standpoint, discussed possible need for back surgery.  Per note, "Otherwise stable from cardiac standpoint and in the absence of recurrence of PSVT like symptoms, I will see her back on a as needed basis. ?She is cleared from cardiac standpoint to have back surgery and aspirin to be held for 7 days." ? ?History of left breast cancer s/p lumpectomy and radiation.  Lymphedema in left upper extremity. ? ?Non-insulin-dependent DM2, well controlled, A1c 7.0 on 04/14/2020. ? ?Preop labs reviewed, unremarkable. ? ?EKG 11/15/2020: Normal sinus rhythm, rate 85, left atrial enlargement, normal axis.  Incomplete right bundle branch block.  Poor R progression, cannot exclude anteroseptal infarct old.  Low-voltage complexes. ? ?Lexiscan myoview stress test 09/09/2017:?1. Lexiscan stress test was performed. Exercise capacity was not assessed. Stress symptoms included dyspnea, dizziness. Peak blood pressure was 158/56 mmHg. The stress electrocardiogram showed sinus tachycardia, normal stress conduction, no stress arrhythmias and normal stress repolarization. Stress EKG is non diagnostic for ischemia as it is a pharmacologic stress. 2. The overall quality of the study is excellent. There is no evidence of abnormal lung activity. Stress and rest SPECT images demonstrate homogeneous tracer distribution throughout the myocardium. Gated SPECT imaging reveals normal myocardial thickening and wall motion. The left ventricular ejection fraction was normal (76%). 3. Low risk study. ?? ??Echocardiogram 09/17/2017:?Left ventricle cavity is normal in size. Mild concentric hypertrophy of the left ventricle. Normal global wall motion. Doppler evidence of  grade I (impaired) diastolic dysfunction, normal LAP. Calculated EF 56%. Right ventricle cavity is mildly dilated. Normal right ventricular function. Mild tricuspid regurgitation. Mild pulmonary  hypertension. Estimated pulmonary artery systolic pressure 32 mmHg IVC is dilated with respiratory variation. This may suggest elevated right heart pressure. Compared to the store on 11/23/25, mild RV dilatation and mild pulmonary hypertension new. ?)  ? ? ? ? ? ?Anesthesia Quick Evaluation ? ?

## 2021-06-06 NOTE — Progress Notes (Signed)
? ? ?06/06/2021 ? ?  ? ?CHIEF COMPLAINT ?Patient presents for  ?Chief Complaint  ?Patient presents with  ? Macular Degeneration  ? ? ?With history of chronic serous subfoveal fluid, no typical change in vision. ? ?HISTORY OF PRESENT ILLNESS: ?[name] is a 76 y.o. female who presents to the clinic today for:  ? ?HPI   ?, ?OS improved macular condition by OCT today, no interval change in vision. ?Last edited by Hurman Horn, MD on 06/06/2021 10:50 AM.  ?  ? ? ?Referring physician: ?Ferd Hibbs, NP ?[address] ?[place],  [place] [address] ? ?HISTORICAL INFORMATION:  ? ?Selected notes from the [facility] ?  ? ?Lab Results  ?Component Value Date  ? HGBA1C 7.0 (H) 04/14/2020  ?  ? ?CURRENT MEDICATIONS: ?Current Outpatient Medications (Ophthalmic Drugs)  ?Medication Sig  ? Brimonidine Tartrate-Timolol (COMBIGAN OP) Place 1 drop into both eyes 2 (two) times daily.  ? Polyethyl Glycol-Propyl Glycol (SYSTANE OP) Place 1 drop into both eyes daily as needed (Dry eye).  ? ?No current facility-administered medications for this visit. (Ophthalmic Drugs)  ? ?Current Outpatient Medications (Other)  ?Medication Sig  ? albuterol (VENTOLIN HFA) 108 (90 Base) MCG/ACT inhaler Inhale 1 puff into the lungs every 6 (six) hours as needed for wheezing or shortness of breath.  ? Ascorbic Acid (VITAMIN C) 1000 MG tablet Take 1,000 mg by mouth daily.  ? aspirin 81 MG tablet Take 81 mg by mouth daily.  ? Cholecalciferol (VITAMIN D) 50 MCG (2000 UT) CAPS Take 2,000 Units by mouth daily.  ? fluticasone (FLONASE) 50 MCG/ACT nasal spray Place 1 spray into both nostrils daily. (Patient taking differently: Place 1 spray into both nostrils every other day.)  ? glucose blood test strip Use as instructed  ? lisinopril (ZESTRIL) 10 MG tablet Take 10 mg by mouth daily.  ? loratadine (CLARITIN) 10 MG tablet Take 1 tablet (10 mg total) by mouth daily. (Patient taking differently: Take 10 mg by mouth every other day.)  ? metFORMIN  (GLUCOPHAGE) 500 MG tablet Take 1 tablet (500 mg total) by mouth daily with breakfast. (Patient taking differently: Take 500 mg by mouth 2 (two) times daily with a meal.)  ? metoprolol succinate (TOPROL-XL) 25 MG 24 hr tablet Take 1 tablet by mouth once daily  ? OVER THE COUNTER MEDICATION Take 1 tablet by mouth 2 (two) times daily. advanced eye health complex  ? simvastatin (ZOCOR) 20 MG tablet TAKE 1 TABLET BY MOUTH AT BEDTIME  ? Turmeric Curcumin 500 MG CAPS Take 500 mg by mouth daily.  ? Zinc 50 MG TABS Take 50 mg by mouth daily.  ? ?Current Facility-Administered Medications (Other)  ?Medication Route  ? EPINEPHrine (Anaphylaxis) SOLN 0.3 mg Intramuscular  ? ? ? ? ?REVIEW OF SYSTEMS: ?ROS   ?Negative for: Constitutional, Gastrointestinal, Neurological, Skin, Genitourinary, Musculoskeletal, HENT, Endocrine, Cardiovascular, Eyes, Respiratory, Psychiatric, Allergic/Imm, Heme/Lymph ?Last edited by Silvestre Moment on 06/06/2021  9:56 AM.  ?  ? ? ? ?ALLERGIES ?Allergies  ?Allergen Reactions  ? Peanut (Diagnostic) Cough, Hypertension and Shortness Of Breath  ? Adhesive [Tape] Rash  ? Atorvastatin Other (See Comments)  ?  Myalgia  ? Crestor [Rosuvastatin] Other (See Comments)  ?  Severe arthralgia and myalgia  ? Latex Itching and Other (See Comments)  ?  Causes blisters  ? Lovastatin Other (See Comments)  ?  Arthralgias, hyperglycemia with lovastatin.  ? ? ?PAST MEDICAL HISTORY ?Past Medical History:  ?Diagnosis Date  ?  Allergic rhinitis 03/23/2013  ? Allergy   ? Anemia   ? due to vaginal bleeding  ? Arthritis   ? hands  ? Asthmatic bronchitis 09/03/2017  ? Breast cancer ([facility]) 08/2012  ? left  ? Dental crowns present   ? Diabetes mellitus without complication ([facility] [place])   ? Eczema   ? Fibroids   ? Glaucoma   ? Headache(784.0)   ? tension  ? History of endometriosis   ? Hordeolum externum right upper eyelid 07/25/2020  ? New finding OD today not noticed by the patient prior  ? Hyperlipidemia   ? Hypertension   ? under control with  med., has been on med. x 3 yr.  ? Nuclear sclerotic cataract of right eye 12/02/2019  ? OSA (obstructive sleep apnea)   ? Paroxysmal SVT (supraventricular tachycardia) (HCC)   ? Postmenopausal bleeding   ? Recurrent upper respiratory infection (URI)   ? S/P radiation therapy 10/20/2012-12/05/2012  ? left breast 50.4 gray, lumpectomy cavity boosted to 62.4 gray  ? Seasonal allergies   ? Sleep apnea   ? uses CPAP nightly  ? Spinal stenosis   ? Urinary urgency   ? White coat hypertension   ? ?Past Surgical History:  ?Procedure Laterality Date  ? [place]  ? lumbar  ? BREAST LUMPECTOMY WITH NEEDLE LOCALIZATION AND AXILLARY SENTINEL LYMPH NODE BX Left 09/03/2012  ? Procedure: BREAST LUMPECTOMY WITH NEEDLE LOCALIZATION AND AXILLARY SENTINEL LYMPH NODE BX;  Surgeon: Edward Jolly, MD;  Location: [facility];  Service: General;  Laterality: Left;  ? CHOLECYSTECTOMY  1995  ? COLONOSCOPY W/ POLYPECTOMY  03/20/2007  ? DILATION AND CURETTAGE OF UTERUS  2005  ? DILATION AND CURETTAGE OF UTERUS  06/22/2011  ? Procedure: DILATATION AND CURETTAGE;  Surgeon: Alvino Chapel, MD;  Location: [facility];  Service: Gynecology;  Laterality: N/A;  OK PER KEELA FOR 7:15 START  ? DORSAL COMPARTMENT RELEASE Right 05/19/2013  ? Procedure: RELEASE 1ST  DORSAL COMPARTMENT RIGHT (DEQUERVAIN);  Surgeon: Cammie Sickle., MD;  Location: [facility];  Service: Orthopedics;  Laterality: Right;  ? EXPLORATORY LAPAROTOMY  06/30/2007  ? ATTEMPTED HYSTERECTOMY ABORTED DUE TO EXTENSIVE ENDOMETRIOSIS W/ DENSE PELVIC ADHESIONS INVOLVING UTERUS AND LOWER RECTOSIGMOID  ? EYE SURGERY Bilateral 2022  ? cataracts  ? HYSTEROSCOPY WITH D & C  05/06/2009  ? ? ?FAMILY HISTORY ?Family History  ?Problem Relation Age of Onset  ? Vision loss Mother   ? Hypertension Mother   ? Anesthesia problems Mother   ?     post-op N/V  ? Heart disease Mother   ?     carotid stenosis s/p CAE  ? Osteoporosis Mother    ? Eczema Mother   ? Depression Father   ? Heart disease Sister 62  ?     arrhythmia  ? Hypertension Sister   ? Hyperlipidemia Sister   ? Diabetes Brother   ? Hypertension Brother   ? Cancer Brother   ?     skin  ? Hyperlipidemia Brother   ? Stroke Brother   ? Kidney failure Maternal Grandmother   ? Heart disease Maternal Grandfather   ? COPD Daughter   ? Atopy Daughter   ? Cancer Cousin   ? Hypertension Other   ? Colon cancer Neg Hx   ? Allergic rhinitis Neg Hx   ? Asthma Neg Hx   ? Urticaria Neg Hx   ? ? ?SOCIAL HISTORY ?Social  History  ? ?Tobacco Use  ? Smoking status: Never  ? Smokeless tobacco: Never  ?Vaping Use  ? Vaping Use: Never used  ?Substance Use Topics  ? Alcohol use: No  ?  Alcohol/week: 0.0 standard drinks  ? Drug use: No  ? ?  ? ?  ? ?OPHTHALMIC EXAM: ? ?Base Eye Exam   ? ? Visual Acuity (ETDRS)   ? ?   Right Left  ? Dist [place] 20/20 20/20  ? ?  ?  ? ? Pupils   ? ?   Pupils APD  ? Right PERRL None  ? Left PERRL None  ? ?  ?  ? ? Visual Fields   ? ?   Left Right  ?  Full Full  ? ?  ?  ? ? Extraocular Movement   ? ?   Right Left  ?  Full, Ortho Full, Ortho  ? ?  ?  ? ? Neuro/Psych   ? ? Oriented x3: Yes  ? Mood/Affect: Normal  ? ?  ?  ? ?  ? ?Slit Lamp and Fundus Exam   ? ? External Exam   ? ?   Right Left  ? External Normal Normal  ? ?  ?  ? ? Slit Lamp Exam   ? ?   Right Left  ? Lids/Lashes Normal Normal  ? Conjunctiva/Sclera White and quiet White and quiet  ? Cornea Clear Clear  ? Anterior Chamber Deep and quiet Deep and quiet  ? Iris Round and reactive Round and reactive  ? Lens Centered posterior chamber intraocular lens Centered posterior chamber intraocular lens  ? Anterior Vitreous Normal Normal  ? ?  ?  ? ? Fundus Exam   ? ?   Right Left  ? Posterior Vitreous  Posterior vitreous detachment?, Vitreous debris, no Weiss ring the  ? Disc  Peripapillary atrophy  ? C/D Ratio  0.25  ? Macula  Retinal pigment epithelial mottling, no exudates, no hemorrhage, macular thickening, Early age related macular  degeneration, Pigmented atrophy, nonturbid subretinal fluid  ? Vessels  Normal, , no DR  ? Periphery  Normal  ? ?  ?  ? ?  ? ? ?IMAGING AND PROCEDURES  ?Imaging and Procedures for 06/06/21 ? ?Intravitreal Fonda Kinder

## 2021-06-06 NOTE — Progress Notes (Signed)
Anesthesia Chart Review: ? ?History of PSVT, HTN, HLD, OSA on CPAP.  Stress test 08/2017 was low risk.  Echo 09/2017 showed EF 56%, mild pulmonary hypertension, no significant valvular abnormalities.  Last seen by Dr. Einar Gip 11/15/2020, doing well at that time from cardiac standpoint, discussed possible need for back surgery.  Per note, "Otherwise stable from cardiac standpoint and in the absence of recurrence of PSVT like symptoms, I will see her back on a as needed basis.  She is cleared from cardiac standpoint to have back surgery and aspirin to be held for 7 days." ? ?History of left breast cancer s/p lumpectomy and radiation.  Lymphedema in left upper extremity. ? ?Non-insulin-dependent DM2, well controlled, A1c 7.0 on 04/14/2020. ? ?Preop labs reviewed, unremarkable. ? ?EKG 11/15/2020: Normal sinus rhythm, rate 85, left atrial enlargement, normal axis.  Incomplete right bundle branch block.  Poor R progression, cannot exclude anteroseptal infarct old.  Low-voltage complexes. ? ?Lexiscan myoview stress test 09/09/2017: 1. Lexiscan stress test was performed. Exercise capacity was not assessed. Stress symptoms included dyspnea, dizziness. Peak blood pressure was 158/56 mmHg. The stress electrocardiogram showed sinus tachycardia, normal stress conduction, no stress arrhythmias and normal stress repolarization. Stress EKG is non diagnostic for ischemia as it is a pharmacologic stress. 2. The overall quality of the study is excellent. There is no evidence of abnormal lung activity. Stress and rest SPECT images demonstrate homogeneous tracer distribution throughout the myocardium. Gated SPECT imaging reveals normal myocardial thickening and wall motion. The left ventricular ejection fraction was normal (76%). 3. Low risk study. ?  ? Echocardiogram 09/17/2017: Left ventricle cavity is normal in size. Mild concentric hypertrophy of the left ventricle. Normal global wall motion. Doppler evidence of grade I (impaired)  diastolic dysfunction, normal LAP. Calculated EF 56%. Right ventricle cavity is mildly dilated. Normal right ventricular function. Mild tricuspid regurgitation. Mild pulmonary hypertension. Estimated pulmonary artery systolic pressure 32 mmHg IVC is dilated with respiratory variation. This may suggest elevated right heart pressure. Compared to the store on 11/23/25, mild RV dilatation and mild pulmonary hypertension new. ? ? ?Karoline Caldwell, PA-C ?[facility] Short Stay Center/Anesthesiology ?Phone [phone] ?06/06/2021 11:46 AM ? ? ? ? ? ?

## 2021-06-06 NOTE — Assessment & Plan Note (Signed)
Component of wet AMD, improving now post Lucentis use of injection ?

## 2021-06-06 NOTE — Assessment & Plan Note (Signed)
Chronic subretinal fluid is in fact improving on Lucentis we will repeat injection today 5-week interval ?

## 2021-06-08 ENCOUNTER — Ambulatory Visit (HOSPITAL_COMMUNITY): Payer: Medicare Other | Admitting: Physician Assistant

## 2021-06-08 ENCOUNTER — Encounter (HOSPITAL_COMMUNITY): Payer: Self-pay | Admitting: Neurological Surgery

## 2021-06-08 ENCOUNTER — Other Ambulatory Visit: Payer: Self-pay

## 2021-06-08 ENCOUNTER — Observation Stay (HOSPITAL_COMMUNITY)
Admission: RE | Admit: 2021-06-08 | Discharge: 2021-06-09 | Disposition: A | Discharge status: Home Health | Payer: Medicare Other | Attending: Neurological Surgery | Admitting: Neurological Surgery

## 2021-06-08 ENCOUNTER — Encounter (HOSPITAL_COMMUNITY)
Admission: RE | Disposition: A | Discharge status: Home Health | Payer: Self-pay | Source: Home / Self Care | Attending: Neurological Surgery

## 2021-06-08 ENCOUNTER — Ambulatory Visit (HOSPITAL_BASED_OUTPATIENT_CLINIC_OR_DEPARTMENT_OTHER): Payer: Medicare Other

## 2021-06-08 ENCOUNTER — Ambulatory Visit (HOSPITAL_COMMUNITY): Payer: Medicare Other

## 2021-06-08 DIAGNOSIS — J45909 Unspecified asthma, uncomplicated: Secondary | ICD-10-CM | POA: Diagnosis not present

## 2021-06-08 DIAGNOSIS — Z7984 Long term (current) use of oral hypoglycemic drugs: Secondary | ICD-10-CM | POA: Diagnosis not present

## 2021-06-08 DIAGNOSIS — Z7982 Long term (current) use of aspirin: Secondary | ICD-10-CM | POA: Diagnosis not present

## 2021-06-08 DIAGNOSIS — M48061 Spinal stenosis, lumbar region without neurogenic claudication: Principal | ICD-10-CM | POA: Insufficient documentation

## 2021-06-08 DIAGNOSIS — E119 Type 2 diabetes mellitus without complications: Secondary | ICD-10-CM | POA: Diagnosis not present

## 2021-06-08 DIAGNOSIS — Z853 Personal history of malignant neoplasm of breast: Secondary | ICD-10-CM | POA: Diagnosis not present

## 2021-06-08 DIAGNOSIS — M5416 Radiculopathy, lumbar region: Secondary | ICD-10-CM

## 2021-06-08 DIAGNOSIS — Z91010 Allergy to peanuts: Secondary | ICD-10-CM | POA: Insufficient documentation

## 2021-06-08 DIAGNOSIS — M4316 Spondylolisthesis, lumbar region: Secondary | ICD-10-CM | POA: Diagnosis not present

## 2021-06-08 DIAGNOSIS — Z79899 Other long term (current) drug therapy: Secondary | ICD-10-CM | POA: Insufficient documentation

## 2021-06-08 DIAGNOSIS — Z91040 Latex allergy status: Secondary | ICD-10-CM | POA: Diagnosis not present

## 2021-06-08 DIAGNOSIS — I10 Essential (primary) hypertension: Secondary | ICD-10-CM | POA: Insufficient documentation

## 2021-06-08 HISTORY — PX: TRANSFORAMINAL LUMBAR INTERBODY FUSION W/ MIS 1 LEVEL: SHX6145

## 2021-06-08 LAB — GLUCOSE, CAPILLARY
Glucose-Capillary: 125 mg/dL — ABNORMAL HIGH (ref 70–99)
Glucose-Capillary: 157 mg/dL — ABNORMAL HIGH (ref 70–99)
Glucose-Capillary: 164 mg/dL — ABNORMAL HIGH (ref 70–99)
Glucose-Capillary: 191 mg/dL — ABNORMAL HIGH (ref 70–99)

## 2021-06-08 LAB — ABO/RH: ABO/RH(D): O POS

## 2021-06-08 SURGERY — MINIMALLY INVASIVE (MIS) TRANSFORAMINAL LUMBAR INTERBODY FUSION (TLIF) 1 LEVEL
Anesthesia: General | Site: Spine Lumbar | Laterality: Right

## 2021-06-08 MED ORDER — SODIUM CHLORIDE 0.9% FLUSH: 3.0000 mL | Freq: Two times a day (BID) | INTRAVENOUS | Status: DC

## 2021-06-08 MED ORDER — THROMBIN 5000 UNITS EX SOLR: CUTANEOUS | Status: DC | PRN

## 2021-06-08 MED ORDER — ONDANSETRON HCL 4 MG/2ML IJ SOLN
INTRAMUSCULAR | Status: DC | PRN
  Administered 2021-06-08: 4 mg via INTRAVENOUS

## 2021-06-08 MED ORDER — CEFAZOLIN SODIUM-DEXTROSE 2-4 GM/100ML-% IV SOLN
2.0000 g | INTRAVENOUS | Status: AC
  Administered 2021-06-08: 2 g via INTRAVENOUS
  Administered 2021-06-08: 800 g via INTRAVENOUS
  Filled 2021-06-08: qty 100

## 2021-06-08 MED ORDER — ROCURONIUM BROMIDE 10 MG/ML (PF) SYRINGE
PREFILLED_SYRINGE | INTRAVENOUS | Status: DC | PRN
  Administered 2021-06-08: 50 mg via INTRAVENOUS
  Administered 2021-06-08: 10 mg via INTRAVENOUS
  Administered 2021-06-08: 20 mg via INTRAVENOUS

## 2021-06-08 MED ORDER — LIDOCAINE-EPINEPHRINE 1 %-1:100000 IJ SOLN
INTRAMUSCULAR | Status: AC
  Filled 2021-06-08: qty 1

## 2021-06-08 MED ORDER — SIMVASTATIN 20 MG PO TABS
20.0000 mg | ORAL_TABLET | Freq: Every day | ORAL | Status: DC
  Administered 2021-06-08: 20 mg via ORAL
  Filled 2021-06-08: qty 1

## 2021-06-08 MED ORDER — DEXAMETHASONE SODIUM PHOSPHATE 10 MG/ML IJ SOLN
INTRAMUSCULAR | Status: DC | PRN
  Administered 2021-06-08: 5 mg via INTRAVENOUS

## 2021-06-08 MED ORDER — BRIMONIDINE TARTRATE-TIMOLOL 0.2-0.5 % OP SOLN
1.0000 [drp] | Freq: Two times a day (BID) | OPHTHALMIC | Status: DC
Start: 2021-06-08 — End: 2021-06-08

## 2021-06-08 MED ORDER — CHLORHEXIDINE GLUCONATE CLOTH 2 % EX PADS: 6.0000 | MEDICATED_PAD | Freq: Once | CUTANEOUS | Status: DC

## 2021-06-08 MED ORDER — LIDOCAINE 2% (20 MG/ML) 5 ML SYRINGE
INTRAMUSCULAR | Status: AC
  Filled 2021-06-08: qty 5

## 2021-06-08 MED ORDER — ONDANSETRON HCL 4 MG/2ML IJ SOLN
INTRAMUSCULAR | Status: AC
  Filled 2021-06-08: qty 2

## 2021-06-08 MED ORDER — ONDANSETRON HCL 4 MG PO TABS: 4.0000 mg | ORAL_TABLET | Freq: Four times a day (QID) | ORAL | Status: DC | PRN

## 2021-06-08 MED ORDER — CHLORHEXIDINE GLUCONATE 0.12 % MT SOLN
15.0000 mL | Freq: Once | OROMUCOSAL | Status: AC
  Administered 2021-06-08: 15 mL via OROMUCOSAL
  Filled 2021-06-08: qty 15

## 2021-06-08 MED ORDER — KETOROLAC TROMETHAMINE 15 MG/ML IJ SOLN
7.5000 mg | Freq: Four times a day (QID) | INTRAMUSCULAR | Status: DC
  Administered 2021-06-08 – 2021-06-09 (×3): 7.5 mg via INTRAVENOUS
  Filled 2021-06-08 (×3): qty 1

## 2021-06-08 MED ORDER — THROMBIN 5000 UNITS EX SOLR
CUTANEOUS | Status: AC
  Filled 2021-06-08: qty 10000

## 2021-06-08 MED ORDER — PHENYLEPHRINE HCL-NACL 20-0.9 MG/250ML-% IV SOLN
INTRAVENOUS | Status: DC | PRN
  Administered 2021-06-08: 30 ug/min via INTRAVENOUS

## 2021-06-08 MED ORDER — POLYVINYL ALCOHOL 1.4 % OP SOLN
Freq: Every day | OPHTHALMIC | Status: DC | PRN
Start: 2021-06-08 — End: 2021-06-09
  Filled 2021-06-08: qty 15

## 2021-06-08 MED ORDER — FLUTICASONE PROPIONATE 50 MCG/ACT NA SUSP: 1.0000 | NASAL | Status: DC

## 2021-06-08 MED ORDER — ONDANSETRON HCL 4 MG/2ML IJ SOLN: 4.0000 mg | Freq: Four times a day (QID) | INTRAMUSCULAR | Status: DC | PRN

## 2021-06-08 MED ORDER — THROMBIN 5000 UNITS EX SOLR
CUTANEOUS | Status: AC
  Filled 2021-06-08: qty 5000

## 2021-06-08 MED ORDER — METHOCARBAMOL 500 MG PO TABS
500.0000 mg | ORAL_TABLET | Freq: Four times a day (QID) | ORAL | Status: DC | PRN
  Administered 2021-06-08: 500 mg via ORAL
  Filled 2021-06-08: qty 1

## 2021-06-08 MED ORDER — LORATADINE 10 MG PO TABS: 10.0000 mg | ORAL_TABLET | ORAL | Status: DC

## 2021-06-08 MED ORDER — EPINEPHRINE (ANAPHYLAXIS) 1 MG/ML IJ SOLN: 0.3000 mg | Freq: Once | INTRAMUSCULAR | Status: DC

## 2021-06-08 MED ORDER — MENTHOL 3 MG MT LOZG: 1.0000 | LOZENGE | OROMUCOSAL | Status: DC | PRN

## 2021-06-08 MED ORDER — AMISULPRIDE (ANTIEMETIC) 5 MG/2ML IV SOLN
10.0000 mg | Freq: Once | INTRAVENOUS | Status: AC | PRN
  Administered 2021-06-08: 10 mg via INTRAVENOUS

## 2021-06-08 MED ORDER — ACETAMINOPHEN 650 MG RE SUPP: 650.0000 mg | RECTAL | Status: DC | PRN

## 2021-06-08 MED ORDER — HYDROCODONE-ACETAMINOPHEN 5-325 MG PO TABS
1.0000 | ORAL_TABLET | ORAL | Status: DC | PRN
  Filled 2021-06-08: qty 1

## 2021-06-08 MED ORDER — SUGAMMADEX SODIUM 200 MG/2ML IV SOLN
INTRAVENOUS | Status: DC | PRN
  Administered 2021-06-08: 200 mg via INTRAVENOUS

## 2021-06-08 MED ORDER — HEMOSTATIC AGENTS (NO CHARGE) OPTIME
TOPICAL | Status: DC | PRN
  Administered 2021-06-08: 1 via TOPICAL

## 2021-06-08 MED ORDER — AMISULPRIDE (ANTIEMETIC) 5 MG/2ML IV SOLN
INTRAVENOUS | Status: AC
  Filled 2021-06-08: qty 4

## 2021-06-08 MED ORDER — FENTANYL CITRATE (PF) 100 MCG/2ML IJ SOLN
INTRAMUSCULAR | Status: AC
  Filled 2021-06-08: qty 2

## 2021-06-08 MED ORDER — ROCURONIUM BROMIDE 10 MG/ML (PF) SYRINGE
PREFILLED_SYRINGE | INTRAVENOUS | Status: AC
  Filled 2021-06-08: qty 10

## 2021-06-08 MED ORDER — CEFAZOLIN SODIUM-DEXTROSE 2-4 GM/100ML-% IV SOLN
2.0000 g | Freq: Three times a day (TID) | INTRAVENOUS | Status: AC
  Administered 2021-06-08 – 2021-06-09 (×2): 2 g via INTRAVENOUS
  Filled 2021-06-08 (×2): qty 100

## 2021-06-08 MED ORDER — LISINOPRIL 10 MG PO TABS: 10.0000 mg | ORAL_TABLET | Freq: Every day | ORAL | Status: DC

## 2021-06-08 MED ORDER — ORAL CARE MOUTH RINSE: 15.0000 mL | Freq: Once | OROMUCOSAL | Status: AC

## 2021-06-08 MED ORDER — BUPIVACAINE-EPINEPHRINE 0.5% -1:200000 IJ SOLN
INTRAMUSCULAR | Status: AC
  Filled 2021-06-08: qty 1

## 2021-06-08 MED ORDER — ACETAMINOPHEN 500 MG PO TABS
1000.0000 mg | ORAL_TABLET | Freq: Once | ORAL | Status: AC
  Administered 2021-06-08: 1000 mg via ORAL
  Filled 2021-06-08: qty 2

## 2021-06-08 MED ORDER — PHENOL 1.4 % MT LIQD: 1.0000 | OROMUCOSAL | Status: DC | PRN

## 2021-06-08 MED ORDER — METOPROLOL SUCCINATE ER 25 MG PO TB24: 25.0000 mg | ORAL_TABLET | Freq: Every day | ORAL | Status: DC

## 2021-06-08 MED ORDER — TIMOLOL MALEATE 0.5 % OP SOLN
1.0000 [drp] | Freq: Two times a day (BID) | OPHTHALMIC | Status: DC
  Filled 2021-06-08: qty 5

## 2021-06-08 MED ORDER — ALBUTEROL SULFATE (2.5 MG/3ML) 0.083% IN NEBU: 3.0000 mL | INHALATION_SOLUTION | Freq: Four times a day (QID) | RESPIRATORY_TRACT | Status: DC | PRN

## 2021-06-08 MED ORDER — FENTANYL CITRATE (PF) 250 MCG/5ML IJ SOLN
INTRAMUSCULAR | Status: DC | PRN
  Administered 2021-06-08: 50 ug via INTRAVENOUS
  Administered 2021-06-08: 100 ug via INTRAVENOUS

## 2021-06-08 MED ORDER — DEXAMETHASONE SODIUM PHOSPHATE 10 MG/ML IJ SOLN
INTRAMUSCULAR | Status: AC
  Filled 2021-06-08: qty 1

## 2021-06-08 MED ORDER — BUPIVACAINE LIPOSOME 1.3 % IJ SUSP
INTRAMUSCULAR | Status: DC | PRN
  Administered 2021-06-08: 20 mL

## 2021-06-08 MED ORDER — DOCUSATE SODIUM 100 MG PO CAPS
100.0000 mg | ORAL_CAPSULE | Freq: Two times a day (BID) | ORAL | Status: DC
  Administered 2021-06-08: 100 mg via ORAL
  Filled 2021-06-08: qty 1

## 2021-06-08 MED ORDER — LIDOCAINE-EPINEPHRINE 1 %-1:100000 IJ SOLN
INTRAMUSCULAR | Status: DC | PRN
  Administered 2021-06-08: 20 mL

## 2021-06-08 MED ORDER — OXYCODONE HCL 5 MG PO TABS: 10.0000 mg | ORAL_TABLET | ORAL | Status: DC | PRN

## 2021-06-08 MED ORDER — FENTANYL CITRATE (PF) 100 MCG/2ML IJ SOLN
25.0000 ug | INTRAMUSCULAR | Status: DC | PRN
  Administered 2021-06-08: 25 ug via INTRAVENOUS
  Administered 2021-06-08: 50 ug via INTRAVENOUS
  Administered 2021-06-08: 25 ug via INTRAVENOUS
  Administered 2021-06-08: 50 ug via INTRAVENOUS

## 2021-06-08 MED ORDER — HYDROMORPHONE HCL 1 MG/ML IJ SOLN: 1.0000 mg | INTRAMUSCULAR | Status: DC | PRN

## 2021-06-08 MED ORDER — LACTATED RINGERS IV SOLN: INTRAVENOUS | Status: DC

## 2021-06-08 MED ORDER — 0.9 % SODIUM CHLORIDE (POUR BTL) OPTIME
TOPICAL | Status: DC | PRN
  Administered 2021-06-08: 1000 mL

## 2021-06-08 MED ORDER — PROPOFOL 10 MG/ML IV BOLUS
INTRAVENOUS | Status: DC | PRN
  Administered 2021-06-08: 160 mg via INTRAVENOUS

## 2021-06-08 MED ORDER — SODIUM CHLORIDE 0.9% FLUSH: 3.0000 mL | INTRAVENOUS | Status: DC | PRN

## 2021-06-08 MED ORDER — ACETAMINOPHEN 325 MG PO TABS: 650.0000 mg | ORAL_TABLET | ORAL | Status: DC | PRN

## 2021-06-08 MED ORDER — FENTANYL CITRATE (PF) 250 MCG/5ML IJ SOLN
INTRAMUSCULAR | Status: AC
  Filled 2021-06-08: qty 5

## 2021-06-08 MED ORDER — INSULIN ASPART 100 UNIT/ML IJ SOLN: 0.0000 [IU] | INTRAMUSCULAR | Status: DC | PRN

## 2021-06-08 MED ORDER — METHOCARBAMOL 1000 MG/10ML IJ SOLN
500.0000 mg | Freq: Four times a day (QID) | INTRAVENOUS | Status: DC | PRN
  Filled 2021-06-08: qty 5

## 2021-06-08 MED ORDER — BRIMONIDINE TARTRATE 0.15 % OP SOLN
1.0000 [drp] | Freq: Two times a day (BID) | OPHTHALMIC | Status: DC
  Filled 2021-06-08: qty 5

## 2021-06-08 MED ORDER — METFORMIN HCL 500 MG PO TABS
500.0000 mg | ORAL_TABLET | Freq: Two times a day (BID) | ORAL | Status: DC
  Administered 2021-06-08 – 2021-06-09 (×2): 500 mg via ORAL
  Filled 2021-06-08 (×2): qty 1

## 2021-06-08 MED ORDER — BUPIVACAINE LIPOSOME 1.3 % IJ SUSP
INTRAMUSCULAR | Status: AC
  Filled 2021-06-08: qty 20

## 2021-06-08 MED ORDER — LIDOCAINE 2% (20 MG/ML) 5 ML SYRINGE
INTRAMUSCULAR | Status: DC | PRN
  Administered 2021-06-08: 60 mg via INTRAVENOUS

## 2021-06-08 MED ORDER — SODIUM CHLORIDE 0.9 % IV SOLN: 250.0000 mL | INTRAVENOUS | Status: DC

## 2021-06-08 SURGICAL SUPPLY — 77 items
BAG COUNTER SPONGE SURGICOUNT (BAG) ×2 IMPLANT
BAND RUBBER #18 3X1/16 STRL (MISCELLANEOUS) ×4 IMPLANT
BASKET BONE COLLECTION (BASKET) ×1 IMPLANT
BUR MATCHSTICK NEURO 3.0 LAGG (BURR) ×2 IMPLANT
CAGE POST LUM NL 26X11X 9 0D (Cage) ×1 IMPLANT
CNTNR URN SCR LID CUP LEK RST (MISCELLANEOUS) ×1 IMPLANT
CONT SPEC 4OZ STRL OR WHT (MISCELLANEOUS) ×2
COVER BACK TABLE 60X90IN (DRAPES) ×2 IMPLANT
DECANTER SPIKE VIAL GLASS SM (MISCELLANEOUS) ×2 IMPLANT
DERMABOND ADVANCED (GAUZE/BANDAGES/DRESSINGS) ×1
DERMABOND ADVANCED .7 DNX12 (GAUZE/BANDAGES/DRESSINGS) IMPLANT
DRAIN JACKSON RD 7FR 3/32 (WOUND CARE) IMPLANT
DRAPE C-ARM 42X72 X-RAY (DRAPES) ×4 IMPLANT
DRAPE C-ARMOR (DRAPES) IMPLANT
DRAPE LAPAROTOMY 100X72X124 (DRAPES) ×2 IMPLANT
DRAPE MICROSCOPE LEICA (MISCELLANEOUS) ×2 IMPLANT
DRAPE STERI IOBAN 125X83 (DRAPES) IMPLANT
DRAPE SURG 17X23 STRL (DRAPES) ×2 IMPLANT
DRSG OPSITE POSTOP 3X4 (GAUZE/BANDAGES/DRESSINGS) ×2 IMPLANT
DURAPREP 26ML APPLICATOR (WOUND CARE) ×2 IMPLANT
ELECT BLADE INSULATED 6.5IN (ELECTROSURGICAL) ×2
ELECT REM PT RETURN 9FT ADLT (ELECTROSURGICAL) ×2
ELECTRODE BLDE INSULATED 6.5IN (ELECTROSURGICAL) ×1 IMPLANT
ELECTRODE REM PT RTRN 9FT ADLT (ELECTROSURGICAL) ×1 IMPLANT
EXTENDER TAB GUIDE SV 5.5/6.0 (INSTRUMENTS) ×8 IMPLANT
GAUZE 4X4 16PLY ~~LOC~~+RFID DBL (SPONGE) IMPLANT
GAUZE SPONGE 4X4 12PLY STRL (GAUZE/BANDAGES/DRESSINGS) ×2 IMPLANT
GLOVE SRG 8 PF TXTR STRL LF DI (GLOVE) ×2 IMPLANT
GLOVE SURG ENC MOIS LTX SZ8 (GLOVE) ×2 IMPLANT
GLOVE SURG LTX SZ8 (GLOVE) ×4 IMPLANT
GLOVE SURG POLYISO LF SZ7 (GLOVE) ×6 IMPLANT
GLOVE SURG UNDER POLY LF SZ6.5 (GLOVE) ×2 IMPLANT
GLOVE SURG UNDER POLY LF SZ7.5 (GLOVE) ×3 IMPLANT
GLOVE SURG UNDER POLY LF SZ8 (GLOVE) ×4
GLOVE SURG UNDER POLY LF SZ8.5 (GLOVE) ×2 IMPLANT
GOWN STRL REUS W/ TWL LRG LVL3 (GOWN DISPOSABLE) IMPLANT
GOWN STRL REUS W/ TWL XL LVL3 (GOWN DISPOSABLE) ×2 IMPLANT
GOWN STRL REUS W/TWL 2XL LVL3 (GOWN DISPOSABLE) IMPLANT
GOWN STRL REUS W/TWL LRG LVL3 (GOWN DISPOSABLE)
GOWN STRL REUS W/TWL XL LVL3 (GOWN DISPOSABLE) ×4
GUIDEWIRE SHARP NITINOL 610 (WIRE) ×4 IMPLANT
HEMOSTAT POWDER KIT SURGIFOAM (HEMOSTASIS) ×1 IMPLANT
KIT BASIN OR (CUSTOM PROCEDURE TRAY) ×2 IMPLANT
KIT INFUSE XX SMALL 0.7CC (Orthopedic Implant) ×1 IMPLANT
KIT POSITION SURG JACKSON T1 (MISCELLANEOUS) ×2 IMPLANT
KIT TURNOVER KIT B (KITS) ×2 IMPLANT
MARKER SKIN DUAL TIP RULER LAB (MISCELLANEOUS) ×2 IMPLANT
NDL ASPIRATION RAN815N 8X15 (NEEDLE) IMPLANT
NDL HYPO 21X1.5 SAFETY (NEEDLE) IMPLANT
NDL HYPO 25X1 1.5 SAFETY (NEEDLE) ×1 IMPLANT
NEEDLE ASPIRATION RAN815N 8X15 (NEEDLE) ×1 IMPLANT
NEEDLE ASPIRATION RANFAC 8X15 (NEEDLE) ×1
NEEDLE HYPO 21X1.5 SAFETY (NEEDLE) ×2 IMPLANT
NEEDLE HYPO 25X1 1.5 SAFETY (NEEDLE) ×2 IMPLANT
NS IRRIG 1000ML POUR BTL (IV SOLUTION) ×2 IMPLANT
PACK LAMINECTOMY NEURO (CUSTOM PROCEDURE TRAY) ×2 IMPLANT
PAD ARMBOARD 7.5X6 YLW CONV (MISCELLANEOUS) ×6 IMPLANT
PATTIES SURGICAL .5 X.5 (GAUZE/BANDAGES/DRESSINGS) IMPLANT
PATTIES SURGICAL .5 X1 (DISPOSABLE) IMPLANT
PATTIES SURGICAL 1X1 (DISPOSABLE) IMPLANT
PUTTY DBF GRAFTON 3CC W/DELIVE (Putty) ×1 IMPLANT
ROD PERC CCM 5.5X30 (Rod) ×1 IMPLANT
ROD PERC CCM 5.5X35 (Rod) ×1 IMPLANT
SCREW 5.5 VOYAGER MAS 7.5X50 (Screw) ×2 IMPLANT
SCREW MAS VOYAGER 6.5X55 (Screw) ×2 IMPLANT
SCREW SET 5.5/6.0MM SOLERA (Screw) ×4 IMPLANT
SPONGE SURGIFOAM ABS GEL SZ50 (HEMOSTASIS) ×1 IMPLANT
SPONGE T-LAP 4X18 ~~LOC~~+RFID (SPONGE) IMPLANT
STAPLER VISISTAT 35W (STAPLE) IMPLANT
SUT VIC AB 0 CT1 18XCR BRD8 (SUTURE) ×1 IMPLANT
SUT VIC AB 0 CT1 8-18 (SUTURE) ×2
SUT VIC AB 2-0 CP2 18 (SUTURE) ×2 IMPLANT
SUT VIC AB 3-0 SH 8-18 (SUTURE) ×2 IMPLANT
TOWEL GREEN STERILE (TOWEL DISPOSABLE) IMPLANT
TOWEL GREEN STERILE FF (TOWEL DISPOSABLE) IMPLANT
TRAY FOLEY MTR SLVR 16FR STAT (SET/KITS/TRAYS/PACK) ×2 IMPLANT
WATER STERILE IRR 1000ML POUR (IV SOLUTION) ×2 IMPLANT

## 2021-06-08 NOTE — TOC Initial Note (Signed)
Transition of Care (TOC) - Initial/Assessment Note  ? ? ?Patient Details  ?Name: [name] ?MRN: [id number] ?Date of Birth: [birth year] ? ?Transition of Care (TOC) CM/SW Contact:    ?Marilu Favre, RN ?Phone Number: ?06/08/2021, 4:33 PM ? ?Clinical Narrative:                 ? ?Dr Dawley office has arranged home health services with [place] .  ? ?3[facility] staff will provide any DME needed.  ? ? ?Transition of Care (TOC) Screening Note ? ? ?Patient Details  ?Name: [name] ?Date of Birth: [birth year] ? ? ?Transition of Care (TOC) CM/SW Contact:    ?Marilu Favre, RN ?Phone Number: ?06/08/2021, 4:34 PM ? ? ? ?Transition of Care Department [facility]) has reviewed patient and no TOC needs have been identified at this time. We will continue to monitor patient advancement through interdisciplinary progression rounds. If new patient transition needs arise, please place a TOC consult. ?  ?  ?  ? ? ?Patient Goals and CMS Choice ?  ?  ?  ? ?Expected Discharge Plan and Services ?  ?  ?  ?  ?  ?                ?  ?  ?  ?  ?  ?  ?  ?  ?  ?  ? ?Prior Living Arrangements/Services ?  ?  ?  ?       ?  ?  ?  ?  ? ?Activities of Daily Living ?  ?  ? ?Permission Sought/Granted ?  ?  ?   ?   ?   ?   ? ?Emotional Assessment ?  ?  ?  ?  ?  ?  ? ?Admission diagnosis:  Lumbar spinal stenosis [M48.061] ?Patient Active Problem List  ? Diagnosis Date Noted  ? Lumbar spinal stenosis 06/08/2021  ? PAD (peripheral artery disease) ([place]) 02/08/2021  ? Vitreomacular adhesion of right eye 02/06/2021  ? Vitreomacular adhesion of left eye 12/26/2020  ? Pseudophakia of right eye 08/29/2020  ? Class 3 severe obesity due to excess calories with serious comorbidity and body mass index (BMI) of 40.0 to 44.9 in adult [facility]) 07/09/2019  ? OSA on CPAP 07/09/2019  ? Glaucoma of right eye 07/09/2019  ? Exudative age-related macular degeneration of left eye with active choroidal neovascularization ([place]) 07/06/2019  ? Serous detachment of retinal pigment  epithelium of left eye 07/06/2019  ? Intermediate stage nonexudative age-related macular degeneration of both eyes 07/06/2019  ? Degenerative retinal drusen of left eye 07/06/2019  ? Macular degeneration 03/25/2019  ? Glaucoma 03/25/2019  ? History of food allergy 10/22/2017  ? Anaphylaxis 10/22/2017  ? Food allergy 09/03/2017  ? Mild persistent asthma 09/03/2017  ? Spinal stenosis of lumbar region with neurogenic claudication 08/01/2016  ? Pure hypercholesterolemia 08/01/2016  ? Osteopenia 10/18/2014  ? Obesity (BMI 30-39.9) 06/22/2014  ? Perennial allergic rhinitis 03/23/2013  ? Cancer of central portion of left female breast ([place]) 08/25/2012  ? Obstructive sleep apnea on CPAP 04/21/2012  ? Diabetes ([place]) 12/28/2011  ? HTN (hypertension) 12/28/2011  ? Uterine bleeding 04/30/2011  ? ?PCP:  Ferd Hibbs, NP ?Pharmacy:   ?[place], [place] - [address] ?[address] ?[place] [address] ?Phone: [phone] Fax: [phone] ? ? ? ? ?Social Determinants of Health (SDOH) Interventions ?  ? ?Readmission Risk Interventions ?   ? View :  No data to display.  ?  ?  ?  ? ? ? ?

## 2021-06-08 NOTE — H&P (Signed)
Providing Compassionate, Quality Care - Together  NEUROSURGERY HISTORY & PHYSICAL   [name] is an 76 y.o. female.   Chief Complaint: Low back pain and right lower extremity radiculopathy HPI: This is a 76 year old female with a history of hypertension, peripheral artery disease, sleep apnea as well as chronic low back pain and right lower extremity radiculopathy.  She presents today for surgical decompression and fusion at L4-5 due to severe stenosis with a degenerative mobile spondylolisthesis.  She failed conservative measures and therefore I offered her surgical decompression, instrumentation and fusion at L4-5.  She received medical clearance.  Past Medical History:  Diagnosis Date   Allergic rhinitis 03/23/2013   Allergy    Anemia    due to vaginal bleeding   Arthritis    hands   Asthmatic bronchitis 09/03/2017   Breast cancer (HCC) 08/2012   left   Dental crowns present    Diabetes mellitus without complication (HCC)    Eczema    Fibroids    Glaucoma    Headache(784.0)    tension   History of endometriosis    Hordeolum externum right upper eyelid 07/25/2020   New finding OD today not noticed by the patient prior   Hyperlipidemia    Hypertension    under control with med., has been on med. x 3 yr.   Nuclear sclerotic cataract of right eye 12/02/2019   OSA (obstructive sleep apnea)    Paroxysmal SVT (supraventricular tachycardia) (HCC)    Postmenopausal bleeding    Recurrent upper respiratory infection (URI)    S/P radiation therapy 10/20/2012-12/05/2012   left breast 50.4 gray, lumpectomy cavity boosted to 62.4 gray   Seasonal allergies    Sleep apnea    uses CPAP nightly   Spinal stenosis    Urinary urgency    White coat hypertension     Past Surgical History:  Procedure Laterality Date   BACK SURGERY  1997   lumbar   BREAST LUMPECTOMY WITH NEEDLE LOCALIZATION AND AXILLARY SENTINEL LYMPH NODE BX Left 09/03/2012   Procedure: BREAST LUMPECTOMY WITH  NEEDLE LOCALIZATION AND AXILLARY SENTINEL LYMPH NODE BX;  Surgeon: Mariella Saa, MD;  Location: [facility];  Service: General;  Laterality: Left;   CHOLECYSTECTOMY  1995   COLONOSCOPY W/ POLYPECTOMY  03/20/2007   DILATION AND CURETTAGE OF UTERUS  2005   DILATION AND CURETTAGE OF UTERUS  06/22/2011   Procedure: DILATATION AND CURETTAGE;  Surgeon: Jeannette Corpus, MD;  Location: [facility];  Service: Gynecology;  Laterality: N/A;  OK PER KEELA FOR 7:15 START   DORSAL COMPARTMENT RELEASE Right 05/19/2013   Procedure: RELEASE 1ST  DORSAL COMPARTMENT RIGHT (DEQUERVAIN);  Surgeon: Wyn Forster., MD;  Location: [facility];  Service: Orthopedics;  Laterality: Right;   EXPLORATORY LAPAROTOMY  06/30/2007   ATTEMPTED HYSTERECTOMY ABORTED DUE TO EXTENSIVE ENDOMETRIOSIS W/ DENSE PELVIC ADHESIONS INVOLVING UTERUS AND LOWER RECTOSIGMOID   EYE SURGERY Bilateral 2022   cataracts   HYSTEROSCOPY WITH D & C  05/06/2009    Family History  Problem Relation Age of Onset   Vision loss Mother    Hypertension Mother    Anesthesia problems Mother        post-op N/V   Heart disease Mother        carotid stenosis s/p CAE   Osteoporosis Mother    Eczema Mother    Depression Father    Heart disease Sister 64  arrhythmia   Hypertension Sister    Hyperlipidemia Sister    Diabetes Brother    Hypertension Brother    Cancer Brother        skin   Hyperlipidemia Brother    Stroke Brother    Kidney failure Maternal Grandmother    Heart disease Maternal Grandfather    COPD Daughter    Atopy Daughter    Cancer Cousin    Hypertension Other    Colon cancer Neg Hx    Allergic rhinitis Neg Hx    Asthma Neg Hx    Urticaria Neg Hx    Social History:  reports that she has never smoked. She has never used smokeless tobacco. She reports that she does not drink alcohol and does not use drugs.  Allergies:  Allergies  Allergen Reactions   Peanut  (Diagnostic) Cough, Hypertension and Shortness Of Breath   Adhesive [Tape] Rash   Atorvastatin Other (See Comments)    Myalgia   Crestor [Rosuvastatin] Other (See Comments)    Severe arthralgia and myalgia   Latex Itching and Other (See Comments)    Causes blisters   Lovastatin Other (See Comments)    Arthralgias, hyperglycemia with lovastatin.    Facility-Administered Medications Prior to Admission  Medication Dose Route Frequency Provider Last Rate Last Admin   EPINEPHrine (Anaphylaxis) SOLN 0.3 mg  0.3 mg Intramuscular Once Bobbitt, Heywood Iles, MD       Medications Prior to Admission  Medication Sig Dispense Refill   albuterol (VENTOLIN HFA) 108 (90 Base) MCG/ACT inhaler Inhale 1 puff into the lungs every 6 (six) hours as needed for wheezing or shortness of breath.     Ascorbic Acid (VITAMIN C) 1000 MG tablet Take 1,000 mg by mouth daily.     aspirin 81 MG tablet Take 81 mg by mouth daily.     Brimonidine Tartrate-Timolol (COMBIGAN OP) Place 1 drop into both eyes 2 (two) times daily.     Cholecalciferol (VITAMIN D) 50 MCG (2000 UT) CAPS Take 2,000 Units by mouth daily.     fluticasone (FLONASE) 50 MCG/ACT nasal spray Place 1 spray into both nostrils daily. (Patient taking differently: Place 1 spray into both nostrils every other day.) 16 g 5   lisinopril (ZESTRIL) 10 MG tablet Take 10 mg by mouth daily.     loratadine (CLARITIN) 10 MG tablet Take 1 tablet (10 mg total) by mouth daily. (Patient taking differently: Take 10 mg by mouth every other day.)     metFORMIN (GLUCOPHAGE) 500 MG tablet Take 1 tablet (500 mg total) by mouth daily with breakfast. (Patient taking differently: Take 500 mg by mouth 2 (two) times daily with a meal.) 90 tablet 1   metoprolol succinate (TOPROL-XL) 25 MG 24 hr tablet Take 1 tablet by mouth once daily 90 tablet 0   OVER THE COUNTER MEDICATION Take 1 tablet by mouth 2 (two) times daily. advanced eye health complex     Polyethyl Glycol-Propyl Glycol  (SYSTANE OP) Place 1 drop into both eyes daily as needed (Dry eye).     simvastatin (ZOCOR) 20 MG tablet TAKE 1 TABLET BY MOUTH AT BEDTIME 90 tablet 0   Turmeric Curcumin 500 MG CAPS Take 500 mg by mouth daily.     Zinc 50 MG TABS Take 50 mg by mouth daily.     glucose blood test strip Use as instructed 100 each 4    Results for orders placed or performed during the hospital encounter of 06/08/21 (from the past  48 hour(s))  Glucose, capillary     Status: Abnormal   Collection Time: 06/08/21  9:36 AM  Result Value Ref Range   Glucose-Capillary 125 (H) 70 - 99 mg/dL    Comment: Glucose reference range applies only to samples taken after fasting for at least 8 hours.  ABO/Rh     Status: None (Preliminary result)   Collection Time: 06/08/21 10:20 AM  Result Value Ref Range   ABO/RH(D) PENDING    Intravitreal Injection, Pharmacologic Agent - OS - Left Eye  Result Date: 06/06/2021 Time Out 06/06/2021. 10:52 AM. Confirmed correct patient, procedure, site, and patient consented. Anesthesia Topical anesthesia was used. Anesthetic medications included Lidocaine 4%. Procedure Preparation included Tobramycin 0.3%, 5% betadine to ocular surface, 10% betadine to eyelids. A 30 gauge needle was used. Injection: 0.5 mg Ranibizumab 0.5 MG/0.05ML   Route: Intravitreal, Site: Left Eye   NDC: [id number], Lot: [id number], Waste: 0 mL Post-op Post injection exam found visual acuity of at least counting fingers. The patient tolerated the procedure well. There were no complications. The patient received written and verbal post procedure care education. Post injection medications included ocuflox.   OCT, Retina - OU - Both Eyes  Result Date: 06/06/2021 Right Eye Quality was good. Scan locations included subfoveal. Central Foveal Thickness: 249. Progression has been stable. Findings include retinal drusen , no IRF, no SRF. Left Eye Quality was good. Scan locations included subfoveal. Central Foveal Thickness: 264.  Progression has been stable. Findings include subretinal fluid, pigment epithelial detachment, no IRF. Notes Looks less subretinal subfoveal fluid today post Lucentis.  We will repeat injection OS today and examination again in 5 weeks OD, incidental partial PVD with VMA to macula remained   ROS All positives and negatives are listed in HPI above   Blood pressure (!) 186/85, pulse 87, temperature 97.7 F (36.5 C), temperature source Oral, resp. rate 18, height 5' (1.524 m), weight 97.5 kg, SpO2 100 %. Physical Exam  A&O x3, PERRLA Cranial nerves II through XII intact Bilateral upper extremity 5/5 Bilateral lower extremity 5/5 except for dorsiflexion/plantarflexion 4+/5 Speech fluent and appropriate  Assessment/Plan 76 year old female with  L4-5 degenerative spondylolisthesis with severe stenosis  -OR today for L4-5 decompression, instrumentation and fusion.  We discussed all risks, benefits infection outcomes.  Informed consent was obtained.  She received medical clearance and has been off of her aspirin.   Thank you for allowing me to participate in this patient's care.  Please do not hesitate to call with questions or concerns.   Monia Pouch, DO Neurosurgeon [facility] Cell: [phone]

## 2021-06-08 NOTE — Op Note (Signed)
? ?Providing Compassionate, Quality Care - Together ? ?Date of service: 06/08/2021 ? ?PREOP DIAGNOSIS:  ?L4-5 degenerative spondylolisthesis, mobile, with severe stenosis and right greater than left radiculopathy ?  ?POSTOP DIAGNOSIS: Same ?  ?PROCEDURE: ?Minimally invasive L 4-5 decompression and transforaminal lumbar interbody fusion and arthrodesis, right sided approach ?Bilateral L4, L5 nonsegmental pedicle screw instrumentation, Medtronic Solera L4: 6.5 x 55 mm bilaterally, L5: 7.5 x 55 mm bilaterally ?Placement of anterior biomechanical device, Medtronic Titan titanium 11 x 26 mm interbody device ?Intraoperative use of autograft, same incision ?Intraoperative use of allograft  ?Intraoperative use of BMP, xxs ?Intraoperative use of fluoroscopy, greater than 1 hour ?Intraoperative use of microscope, for microdissection ?  ?SURGEON: Dr. Pieter Partridge C. Daily Crate, DO ?  ?ASSISTANT: Dr. Ashok Pall, MD ?  ?ANESTHESIA: General Endotracheal ?  ?EBL: 75 cc ?  ?SPECIMENS: None ?  ?DRAINS: None ?  ?COMPLICATIONS: None ?  ?CONDITION: Hemodynamically stable ?  ?HISTORY: ?[name] is a 76 y.o. y.o. female who initially presented to the outpatient clinic with signs and symptoms consistent with radiculopathy and low back pain. MRI demonstrated severe stenosis at L4-5 due to degenerative grade 1 spondylolisthesis that was found to be mobile on flexion and extension imaging. Treatment options were discussed including injections, physical therapy and pain control.  She failed conservative measures including injections and pain control.  Despite her conservative treatments, she continued to have significant low back pain and right lower extremity greater than left lower extremity radiculopathy that was altering her lifestyle therefore I offered her surgical intervention in the form of a decompression, instrumentation and fusion at L4-5. All risks, benefits and expected outcomes were discussed agreed upon.  Informed consent was  obtained. ?  ?PROCEDURE IN DETAIL: ?The patient was brought to the operating room. After induction of general anesthesia, the patient was positioned on the operative table in the prone position on the Dumont table. All pressure points were meticulously padded. Skin incision was then marked out and prepped and draped in the usual sterile fashion. Physician driven timeout was performed. ?  ?Using biplanar fluoroscopy, the C arm for sterilely draped and brought into the field and the paramedian incisions were planned over the L4-5 interspace.  Local anesthetic was injected bilaterally.  Using a 10 blade, paramedian incision was created bilaterally.  Using Jamshidi, the bilateral L4 pedicles were accessed under biplanar fluoroscopy.  K wires were placed and the Jamshidi's were removed.  This was repeated bilaterally at L5.  Attention was then turned to docking the Metrix dilator.  Using with a series of dilators, on the patient's right side the facet lamina junction was docked with a 26 x 7 mm tube.  This was confirmed to be in the appropriate trajectory under lateral fluoroscopy.  At this point the microscope was sterilely draped and brought into the field. ?  ?Using Bovie electrocautery, soft tissue was cleared from the lamina and facet at L4-5.  Using a high-speed drill and collecting the autograft, a laminectomy and complete facetectomy was performed down to the ligamentum flavum.  This autograft was saved for later.  The ligamentum flavum was identified to its attachment along the superior lamina and lateral pars.  Using a series of micro curettes, the ligamentum flavum was gently elevated and the epidural space was identified.  The ligamentum flavum was completely resected using Kerrison rongeurs.  The traversing and exiting nerve roots were identified.  Using Kerrison rongeurs, the common dural tube and traversing and exiting nerve roots were  completely decompressed from the ligamentum flavum.  They appeared  pulsatile and noncompressed.  Camden's triangle was identified, the traversing nerve root was gently retracted medially, and the epidural space was coagulated and cleared of epidural veins.  The disc space was identified.  The disc annulus was then coagulated and incised with an 11 blade.  Using Kerrison rongeurs the annulotomy was widened.  Using a series of disc prep shavers and curettes a radical discectomy was performed to subchondral bleeding bone at both endplates.  The disc base was then trialed and found to be in 11 mm interbody size.  A mixture of autograft and allograft was then placed anteriorly and medially and packed with a bone tamp as well as BMP.  Using a nerve root retractor, the traversing nerve root was gently protected during placement of the interbody. ?  ?Using lateral fluoroscopy, the interbody was then placed under live fluoroscopy.  Appropriate placement was identified.  The remainder of the autograft and allograft was then placed laterally and the intervertebral space to the interbody device.  Hemostasis was achieved with passive hemostatics and bipolar cautery.  The traversing nerve root,, neural tube and exiting nerve root were followed with a ball-tipped probe and noted to be noncompressed, pulsatile and in their normal position.  A few pieces of Gelfoam with thrombin were placed over the thecal sac.  The Metrix dilator tube was gently removed and hemostasis was achieved with bipolar cautery and the soft tissues. ?  ?The above listed pedicle screws were then placed under lateral fluoroscopy over the K wires bilaterally at L4 and L5.  Appropriate sized rods were measured, contoured and placed percutaneously.  These were confirmed to be within the tulips, setscrews were placed and final tightened to the manufacturer's recommendations.  Percutaneous towers were removed from the screws.  Final AP and lateral fluoroscopy images confirmed appropriate placement of all hardware.  Hemostasis was  achieved with bipolar cautery.  The wound was closed in layers, 2-0 and 3-0 Vicryl sutures for the dermis.  Skin was closed with skin glue.  Sterile dressing was applied. ?  ?At the end of the case all sponge, needle, and instrument counts were correct. The patient was then transferred to the stretcher, extubated, and taken to the post-anesthesia care unit in stable hemodynamic condition. ? ?

## 2021-06-08 NOTE — Anesthesia Procedure Notes (Signed)
Procedure Name: Intubation ?Date/Time: 06/08/2021 11:00 AM ?Performed by: Barrington Ellison, CRNA ?Pre-anesthesia Checklist: Patient identified, Emergency Drugs available, Suction available and Patient being monitored ?Patient Re-evaluated:Patient Re-evaluated prior to induction ?Oxygen Delivery Method: Circle System Utilized ?Preoxygenation: Pre-oxygenation with 100% oxygen ?Induction Type: IV induction ?Ventilation: Mask ventilation without difficulty and Oral airway inserted - appropriate to patient size ?Laryngoscope Size: Mac and 3 ?Grade View: Grade I ?Tube type: Oral ?Tube size: 7.0 mm ?Number of attempts: 1 ?Airway Equipment and Method: Stylet and Oral airway ?Placement Confirmation: ETT inserted through vocal cords under direct vision, positive ETCO2 and breath sounds checked- equal and bilateral ?Secured at: 21 cm ?Tube secured with: Tape ?Dental Injury: Teeth and Oropharynx as per pre-operative assessment  ?Comments: Intubated by Everlene Other SRNA ? ? ? ? ?

## 2021-06-08 NOTE — Transfer of Care (Signed)
Immediate Anesthesia Transfer of Care Note ? ?Patient: [name] ? ?Procedure(s) Performed: Mininmally Invasive , Decompression Transforaminal Lumbar Interbody Fusion Lumbar four-five (Right: Spine Lumbar) ? ?Patient Location: PACU ? ?Anesthesia Type:General ? ?Level of Consciousness: awake and oriented ? ?Airway & Oxygen Therapy: Patient Spontanous Breathing ? ?Post-op Assessment: Report given to RN and Patient moving all extremities X 4 ? ?Post vital signs: Reviewed and stable ? ?Last Vitals:  ?Vitals Value Taken Time  ?BP 171/85 06/08/21 1351  ?Temp    ?Pulse 79 06/08/21 1353  ?Resp 9 06/08/21 1353  ?SpO2 98 % 06/08/21 1353  ?Vitals shown include unvalidated device data. ? ?Last Pain:  ?Vitals:  ? 06/08/21 1020  ?TempSrc:   ?PainSc: 2   ?   ? ?  ? ?Complications: No notable events documented. ?

## 2021-06-09 DIAGNOSIS — M48061 Spinal stenosis, lumbar region without neurogenic claudication: Secondary | ICD-10-CM | POA: Diagnosis not present

## 2021-06-09 LAB — GLUCOSE, CAPILLARY: Glucose-Capillary: 126 mg/dL — ABNORMAL HIGH (ref 70–99)

## 2021-06-09 MED ORDER — ASPIRIN 81 MG PO TABS: 81.0000 mg | ORAL_TABLET | Freq: Every day | ORAL | Status: AC

## 2021-06-09 MED ORDER — METHOCARBAMOL 500 MG PO TABS: 500.0000 mg | ORAL_TABLET | Freq: Four times a day (QID) | ORAL | 1 refills | Status: DC | PRN

## 2021-06-09 MED ORDER — HYDROCODONE-ACETAMINOPHEN 5-325 MG PO TABS: 1.0000 | ORAL_TABLET | ORAL | 0 refills | Status: DC | PRN

## 2021-06-09 NOTE — Plan of Care (Signed)

## 2021-06-09 NOTE — Addendum Note (Signed)
Addendum  created 06/09/21 1257 by Josephine Igo, CRNA  ? Intraprocedure Meds edited  ?  ?

## 2021-06-09 NOTE — Discharge Instructions (Addendum)
Okay to restart aspirin on postoperative day 7 ? ?Wound Care ?Leave incision open to air. ?You may shower. ?Do not scrub directly on incision.  ?Do not put any creams, lotions, or ointments on incision. ? ?Activity ?Walk each and every day, increasing distance each day. ?No lifting greater than 5 lbs.  Avoid bending, arching, and twisting. ?No driving for 2 weeks; may ride as a passenger locally. ?If provided with back brace, wear when out of bed.  It is not necessary to wear in bed. ? ?Diet ?Resume your normal diet.  ? ?Call Your Doctor If Any of These Occur ?Redness, drainage, or swelling at the wound.  ?Temperature greater than 101 degrees. ?Severe pain not relieved by pain medication. ?Incision starts to come apart. ? ?Follow Up Appt ?Call today for appointment in 3 weeks [phone]) or for problems.  If you have any hardware placed in your spine, you will need an x-ray before your appointment. ? ?

## 2021-06-09 NOTE — Anesthesia Postprocedure Evaluation (Signed)
Anesthesia Post Note ? ?Patient: [name] ? ?Procedure(s) Performed: Mininmally Invasive , Decompression Transforaminal Lumbar Interbody Fusion Lumbar four-five (Right: Spine Lumbar) ? ?  ? ?Patient location during evaluation: PACU ?Anesthesia Type: General ?Level of consciousness: awake and alert ?Pain management: pain level controlled ?Vital Signs Assessment: post-procedure vital signs reviewed and stable ?Respiratory status: spontaneous breathing, nonlabored ventilation, respiratory function stable and patient connected to nasal cannula oxygen ?Cardiovascular status: blood pressure returned to baseline and stable ?Postop Assessment: no apparent nausea or vomiting ?Anesthetic complications: no ? ? ?No notable events documented. ? ?Last Vitals:  ?Vitals:  ? 06/09/21 0355 06/09/21 0745  ?BP: (!) 140/56 (!) 131/57  ?Pulse: 73 77  ?Resp: 20 16  ?Temp: 36.9 ?C 36.8 ?C  ?SpO2: 99% 98%  ?  ?Last Pain:  ?Vitals:  ? 06/09/21 0745  ?TempSrc: Oral  ?PainSc:   ? ? ?  ?  ?  ?  ?  ?  ? ?Suzette Battiest E ? ? ? ? ?

## 2021-06-09 NOTE — Progress Notes (Signed)
Patient awaiting transport via wheelchair by volunteer for discharge home; in no acute distress nor complaints of pain nor discomfort; incision on her lower back with honeycomb dressing and were clean, dry and intact; room was checked for her belongings; discharge instructions concerning her medications, incision care, follow up appointment and when to call the doctor as needed were all discussed with patient by RN and she verbalized understanding on the instructions given. ?

## 2021-06-09 NOTE — Evaluation (Signed)
Occupational Therapy Evaluation ?Patient Details ?Name: [name] ?MRN: [id number] ?DOB: 07/26/1945 ?Today's Date: 06/09/2021 ? ? ?History of Present Illness Pt is a 76 y.o. female s/p L4-5 TLIF 3/23. PMH:  hypertension, peripheral artery disease, sleep apnea as well as chronic low back pain and right lower extremity radiculopathy  ? ?Clinical Impression ?  ?PTA, pt was living alone and was independent; daughter planning to stay at dc. Currently, pt performing at Mod I level for ADLs and functional mobility using RW. Provided education and handout on back precautions,grooming, UB ADLs, LB ADLs, toileting, and tub transfer; pt demonstrated understanding. Answered all pt questions. Recommend dc home once medically stable per physician. All acute OT needs met and will sign off. Thank you.  ?   ? ?Recommendations for follow up therapy are one component of a multi-disciplinary discharge planning process, led by the attending physician.  Recommendations may be updated based on patient status, additional functional criteria and insurance authorization.  ? ?Follow Up Recommendations ? No OT follow up  ?  ?Assistance Recommended at Discharge PRN  ?Patient can return home with the following   ? ?  ?Functional Status Assessment ?    ?Equipment Recommendations ? None recommended by OT  ?  ?Recommendations for Other Services PT consult ? ? ?  ?Precautions / Restrictions Precautions ?Precautions: Back ?Precaution Booklet Issued: Yes (comment) ?Precaution Comments: Educated on 3/3 back precautions. Handout provided ?Required Braces or Orthoses: Other Brace ?Other Brace: no brace needed ?Restrictions ?Other Position/Activity Restrictions: no brace needed  ? ?  ? ?Mobility Bed Mobility ?Overal bed mobility: Modified Independent ?  ?  ?  ?  ?  ?  ?General bed mobility comments: increased time, educated on logroll ?  ? ?Transfers ?Overall transfer level: Modified independent ?Equipment used: Ambulation equipment used ?  ?  ?  ?  ?   ?  ?  ?  ?  ? ?  ?Balance Overall balance assessment: No apparent balance deficits (not formally assessed) ?  ?  ?  ?  ?  ?  ?  ?  ?  ?  ?  ?  ?  ?  ?  ?  ?  ?  ?   ? ?ADL either performed or assessed with clinical judgement  ? ?ADL Overall ADL's : Modified independent ?  ?  ?  ?  ?  ?  ?  ?  ?  ?  ?  ?  ?  ?  ?  ?  ?  ?  ?  ?General ADL Comments: Providing education on back precautions, grooming, bed mobility, UB ADLs, LB ADLs using AE, toileting, and tub transfer. Pt demosntrating udnerstanding. Performing at Mod I level with increased time.  ? ? ? ?Vision Baseline Vision/History: 1 Wears glasses ?   ?   ?Perception   ?  ?Praxis   ?  ? ?Pertinent Vitals/Pain Pain Assessment ?Pain Assessment: No/denies pain  ? ? ? ?Hand Dominance Left ?  ?Extremity/Trunk Assessment Upper Extremity Assessment ?Upper Extremity Assessment: Overall WFL for tasks assessed ?  ?Lower Extremity Assessment ?Lower Extremity Assessment: Defer to PT evaluation ?  ?Cervical / Trunk Assessment ?Cervical / Trunk Assessment: Back Surgery ?  ?Communication Communication ?Communication: No difficulties ?  ?Cognition Arousal/Alertness: Awake/alert ?Behavior During Therapy: WFL for tasks assessed/performed ?Overall Cognitive Status: Within Functional Limits for tasks assessed ?  ?  ?  ?  ?  ?  ?  ?  ?  ?  ?  ?  ?  ?  ?  ?  ?  ?  ?  ?  General Comments    ? ?  ?Exercises   ?  ?Shoulder Instructions    ? ? ?Home Living Family/patient expects to be discharged to:: Private residence ?Living Arrangements: Alone ?Available Help at Discharge: Family;Available 24 hours/day (daughter plans to stay with pt over the weekend following d/c) ?Type of Home: House ?Home Access: Stairs to enter ?Entrance Stairs-Number of Steps: 2 ?Entrance Stairs-Rails: None (cane use door frame for support) ?Home Layout: One level ?  ?  ?Bathroom Shower/Tub: Tub/shower unit ?  ?Bathroom Toilet: Standard ?  ?  ?Home Equipment: Rolling Walker (2 wheels);Cane - single point;Adaptive  equipment ?Adaptive Equipment: Reacher;Long-handled shoe horn ?  ?  ? ?  ?Prior Functioning/Environment Prior Level of Function : Independent/Modified Independent;Driving ?  ?  ?  ?  ?  ?  ?Mobility Comments: cane for amb ?  ?  ? ?  ?  ?OT Problem List: Decreased range of motion;Impaired balance (sitting and/or standing);Decreased activity tolerance ?  ?   ?OT Treatment/Interventions:    ?  ?OT Goals(Current goals can be found in the care plan section) Acute Rehab OT Goals ?Patient Stated Goal: Go home ?OT Goal Formulation: All assessment and education complete, DC therapy  ?OT Frequency:   ?  ? ?Co-evaluation   ?  ?  ?  ?  ? ?  ?AM-PAC OT "6 Clicks" Daily Activity     ?Outcome Measure Help from another person eating meals?: None ?Help from another person taking care of personal grooming?: None ?Help from another person toileting, which includes using toliet, bedpan, or urinal?: None ?Help from another person bathing (including washing, rinsing, drying)?: None ?Help from another person to put on and taking off regular upper body clothing?: None ?Help from another person to put on and taking off regular lower body clothing?: None ?6 Click Score: 24 ?  ?End of Session Nurse Communication: Mobility status ? ?Activity Tolerance: Patient tolerated treatment well ?Patient left: in bed;with call bell/phone within reach ? ?OT Visit Diagnosis: Unsteadiness on feet (R26.81);Other abnormalities of gait and mobility (R26.89)  ?              ?Time: 0756-0817 ?OT Time Calculation (min): 21 min ?Charges:  OT General Charges ?$OT Visit: 1 Visit ?OT Evaluation ?$OT Eval Low Complexity: 1 Low ? ?Charis Capehart MSOT, OTR/L ?Acute Rehab ?Pager: [phone] ?Office: [phone] ? ?Charis M Capehart ?06/09/2021, 9:24 AM ?

## 2021-06-09 NOTE — Discharge Summary (Signed)
? ?Physician Discharge Summary  ?Patient ID: ?[name] ?MRN: [id number] ?DOB/AGE: 09-01-1945 76 y.o. ? ?Admit date: 06/08/2021 ?Discharge date: 06/09/2021 ? ?Admission Diagnoses:  ?L4-5 degenerative spondylolisthesis with severe stenosis ? ?Discharge Diagnoses:  ?Same ?Principal Problem: ?  Lumbar spinal stenosis ? ? ?Discharged Condition: Stable ? ?Hospital Course:  ?[name] is a 76 y.o. female that underwent an elective L4-5 decompression, instrumentation and fusion on 06/08/2021.  She tolerated the surgery well.  Postoperatively she was monitored and her pain was controlled on oral medication.  She was ambulating independently.  She was having normal bowel bladder function, and tolerating normal diet.  She was ambulating well, her preoperative radiculopathy was improved.  Her incisions were clean dry and intact. ? ?Treatments: Surgery -L4-5 decompression, instrumentation and TLIF ? ?Discharge Exam: ?Blood pressure (!) 140/56, pulse 73, temperature 98.4 ?F (36.9 ?C), temperature source Oral, resp. rate 20, height 5' (1.524 m), weight 97.5 kg, SpO2 99 %. ?Awake, alert, oriented x3 ?PERRLA ?Speech fluent, appropriate ?CN grossly intact ?5/5 BUE/BLE ?Wound c/d/I ?Sensory intact to light touch ? ?Disposition: Discharge disposition: 01-Home or Self Care ? ? ? ? ? ? ?Discharge Instructions   ? ? Incentive spirometry RT   Complete by: As directed ?  ? ?  ? ?Allergies as of 06/09/2021   ? ?   Reactions  ? Peanut (diagnostic) Cough, Hypertension, Shortness Of Breath  ? Adhesive [tape] Rash  ? Atorvastatin Other (See Comments)  ? Myalgia  ? Crestor [rosuvastatin] Other (See Comments)  ? Severe arthralgia and myalgia  ? Latex Itching, Other (See Comments)  ? Causes blisters  ? Lovastatin Other (See Comments)  ? Arthralgias, hyperglycemia with lovastatin.  ? ?  ? ?  ?Medication List  ?  ? ?TAKE these medications   ? ?albuterol 108 (90 Base) MCG/ACT inhaler ?Commonly known as: VENTOLIN HFA ?Inhale 1 puff into the  lungs every 6 (six) hours as needed for wheezing or shortness of breath. ?  ?aspirin 81 MG tablet ?Take 1 tablet (81 mg total) by mouth daily. ?Start taking on: June 15, 2021 ?What changed: These instructions start on June 15, 2021. If you are unsure what to do until then, ask your doctor or other care provider. ?  ?COMBIGAN OP ?Place 1 drop into both eyes 2 (two) times daily. ?  ?fluticasone 50 MCG/ACT nasal spray ?Commonly known as: FLONASE ?Place 1 spray into both nostrils daily. ?What changed: when to take this ?  ?glucose blood test strip ?Use as instructed ?  ?HYDROcodone-acetaminophen 5-325 MG tablet ?Commonly known as: NORCO/VICODIN ?Take 1 tablet by mouth every 4 (four) hours as needed for moderate pain ((score 4 to 6)). ?  ?lisinopril 10 MG tablet ?Commonly known as: ZESTRIL ?Take 10 mg by mouth daily. ?  ?loratadine 10 MG tablet ?Commonly known as: CLARITIN ?Take 1 tablet (10 mg total) by mouth daily. ?What changed: when to take this ?  ?metFORMIN 500 MG tablet ?Commonly known as: GLUCOPHAGE ?Take 1 tablet (500 mg total) by mouth daily with breakfast. ?What changed: when to take this ?  ?methocarbamol 500 MG tablet ?Commonly known as: ROBAXIN ?Take 1 tablet (500 mg total) by mouth every 6 (six) hours as needed for muscle spasms. ?  ?metoprolol succinate 25 MG 24 hr tablet ?Commonly known as: TOPROL-XL ?Take 1 tablet by mouth once daily ?  ?OVER THE COUNTER MEDICATION ?Take 1 tablet by mouth 2 (two) times daily. advanced eye health complex ?  ?simvastatin 20 MG tablet ?  Commonly known as: ZOCOR ?TAKE 1 TABLET BY MOUTH AT BEDTIME ?  ?SYSTANE OP ?Place 1 drop into both eyes daily as needed (Dry eye). ?  ?Turmeric Curcumin 500 MG Caps ?Take 500 mg by mouth daily. ?  ?vitamin C 1000 MG tablet ?Take 1,000 mg by mouth daily. ?  ?Vitamin D 50 MCG (2000 UT) Caps ?Take 2,000 Units by mouth daily. ?  ?Zinc 50 MG Tabs ?Take 50 mg by mouth daily. ?  ? ?  ? ? Follow-up Information   ? ? [facility]  Follow up.   ?Contact information: ?[address] ?[place] [address] ?[phone] ? ? ?  ?  ? ? Tyronn Golda C, DO Follow up in 3 week(s).   ?Contact information: ?[address] ?[address] ?[place] [address] ?[phone] ? ? ?  ?  ? ?  ?  ? ?  ? ? ?Signed: ?Theodoro Doing Adael Culbreath ?06/09/2021, 7:17 AM ? ? ?

## 2021-06-09 NOTE — Care Management CC44 (Signed)
Condition Code 44 Documentation Completed ? ?Patient Details  ?Name: [name] ?MRN: [id number] ?Date of Birth: 1945/05/26 ? ? ?Condition Code 44 given:  Yes ?Patient signature on Condition Code 44 notice:  Yes ?Documentation of 2 MD's agreement:  Yes ?Code 44 added to claim:  Yes ? ? ? ?Marilu Favre, RN ?06/09/2021, 8:48 AM ? ?

## 2021-06-09 NOTE — Evaluation (Signed)
Physical Therapy Evaluation ?Patient Details ?Name: [name] ?MRN: [id number] ?DOB: 1945-08-04 ?Today's Date: 06/09/2021 ? ?History of Present Illness ? Pt is a 76 y.o. female s/p L4-5 TLIF 3/23. PMH:  hypertension, peripheral artery disease, sleep apnea as well as chronic low back pain and right lower extremity radiculopathy ?  ?Clinical Impression ? PT eval complete. Pt is mod I bed mobility and transfers. Supervision in room amb without AD, supervision hallway amb 300' with RW, and min guard assist ascend/descend 2 steps. Educated on 3/3 back precautions. Handout provided. Plan is for d/c home today. Daughter plans to stay with pt and is able to provide needed level of assist. No DME needs. No follow up PT services indicated.   ?   ? ?Recommendations for follow up therapy are one component of a multi-disciplinary discharge planning process, led by the attending physician.  Recommendations may be updated based on patient status, additional functional criteria and insurance authorization. ? ?Follow Up Recommendations No PT follow up ? ?  ?Assistance Recommended at Discharge PRN  ?Patient can return home with the following ? Assist for transportation;Assistance with cooking/housework;Help with stairs or ramp for entrance ? ?  ?Equipment Recommendations None recommended by PT  ?Recommendations for Other Services ?    ?  ?Functional Status Assessment Patient has had a recent decline in their functional status and demonstrates the ability to make significant improvements in function in a reasonable and predictable amount of time.  ? ?  ?Precautions / Restrictions Precautions ?Precautions: Back ?Precaution Comments: Educated on 3/3 back precautions. Handout provided ?Restrictions ?Other Position/Activity Restrictions: no brace needed  ? ?  ? ?Mobility ? Bed Mobility ?Overal bed mobility: Modified Independent ?  ?  ?  ?  ?  ?  ?General bed mobility comments: increased time, educated on logroll ?   ? ?Transfers ?Overall transfer level: Modified independent ?Equipment used: Ambulation equipment used ?  ?  ?  ?  ?  ?  ?  ?  ?  ? ?Ambulation/Gait ?Ambulation/Gait assistance: Supervision ?Gait Distance (Feet): 300 Feet ?Assistive device: Rolling walker (2 wheels) ?Gait Pattern/deviations: Step-through pattern, Decreased stride length ?Gait velocity: decreased ?Gait velocity interpretation: 1.31 - 2.62 ft/sec, indicative of limited community ambulator ?  ?General Gait Details: steady gait with RW in hall, amb in room without AD ? ?Stairs ?Stairs: Yes ?Stairs assistance: Min guard ?Stair Management: Two rails, Step to pattern, Forwards ?Number of Stairs: 2 ?  ? ?Wheelchair Mobility ?  ? ?Modified Rankin (Stroke Patients Only) ?  ? ?  ? ?Balance Overall balance assessment: No apparent balance deficits (not formally assessed) ?  ?  ?  ?  ?  ?  ?  ?  ?  ?  ?  ?  ?  ?  ?  ?  ?  ?  ?   ? ? ? ?Pertinent Vitals/Pain Pain Assessment ?Pain Assessment: No/denies pain  ? ? ?Home Living Family/patient expects to be discharged to:: Private residence ?Living Arrangements: Alone ?Available Help at Discharge: Family;Available 24 hours/day (daughter plans to stay with pt over the weekend following d/c) ?Type of Home: House ?Home Access: Stairs to enter ?Entrance Stairs-Rails: None (cane use door frame for support) ?Entrance Stairs-Number of Steps: 2 ?  ?Home Layout: One level ?Home Equipment: Conservation officer, nature (2 wheels);Cane - single point ?   ?  ?Prior Function Prior Level of Function : Independent/Modified Independent;Driving ?  ?  ?  ?  ?  ?  ?Mobility Comments: cane  for amb ?  ?  ? ? ?Hand Dominance  ?   ? ?  ?Extremity/Trunk Assessment  ? Upper Extremity Assessment ?Upper Extremity Assessment: Defer to OT evaluation ?  ? ?Lower Extremity Assessment ?Lower Extremity Assessment: Overall WFL for tasks assessed ?  ? ?Cervical / Trunk Assessment ?Cervical / Trunk Assessment: Back Surgery  ?Communication  ? Communication: No  difficulties  ?Cognition Arousal/Alertness: Awake/alert ?Behavior During Therapy: [facility] for tasks assessed/performed ?Overall Cognitive Status: Within Functional Limits for tasks assessed ?  ?  ?  ?  ?  ?  ?  ?  ?  ?  ?  ?  ?  ?  ?  ?  ?  ?  ?  ? ?  ?General Comments   ? ?  ?Exercises    ? ?Assessment/Plan  ?  ?PT Assessment Patient does not need any further PT services  ?PT Problem List   ? ?   ?  ?PT Treatment Interventions     ? ?PT Goals (Current goals can be found in the Care Plan section)  ?Acute Rehab PT Goals ?Patient Stated Goal: home ?PT Goal Formulation: All assessment and education complete, DC therapy ? ?  ?Frequency   ?  ? ? ?Co-evaluation   ?  ?  ?  ?  ? ? ?  ?AM-PAC PT "6 Clicks" Mobility  ?Outcome Measure Help needed turning from your back to your side while in a flat bed without using bedrails?: None ?Help needed moving from lying on your back to sitting on the side of a flat bed without using bedrails?: None ?Help needed moving to and from a bed to a chair (including a wheelchair)?: None ?Help needed standing up from a chair using your arms (e.g., wheelchair or bedside chair)?: None ?Help needed to walk in hospital room?: A Little ?Help needed climbing 3-5 steps with a railing? : A Little ?6 Click Score: 22 ? ?  ?End of Session Equipment Utilized During Treatment: Gait belt ?Activity Tolerance: Patient tolerated treatment well ?Patient left: Other (comment) (with OT) ?Nurse Communication: Mobility status ?PT Visit Diagnosis: Difficulty in walking, not elsewhere classified (R26.2) ?  ? ?Time: 1610-9604 ?PT Time Calculation (min) (ACUTE ONLY): 14 min ? ? ?Charges:   PT Evaluation ?$PT Eval Low Complexity: 1 Low ?$PT Eval Moderate Complexity: 1 Mod ?  ?  ?   ? ? ?Lorrin Goodell, PT  ?Office # [phone] ?Pager [phone] ? ? ?Lorriane Shire ?06/09/2021, 8:31 AM ? ?

## 2021-06-12 ENCOUNTER — Encounter (HOSPITAL_COMMUNITY): Payer: Self-pay | Admitting: Neurological Surgery

## 2021-06-22 ENCOUNTER — Other Ambulatory Visit: Payer: Self-pay | Admitting: Cardiology

## 2021-07-11 ENCOUNTER — Ambulatory Visit (INDEPENDENT_AMBULATORY_CARE_PROVIDER_SITE_OTHER): Payer: Medicare Other | Admitting: Ophthalmology

## 2021-07-11 ENCOUNTER — Encounter (INDEPENDENT_AMBULATORY_CARE_PROVIDER_SITE_OTHER): Payer: Self-pay | Admitting: Ophthalmology

## 2021-07-11 DIAGNOSIS — H353221 Exudative age-related macular degeneration, left eye, with active choroidal neovascularization: Secondary | ICD-10-CM

## 2021-07-11 DIAGNOSIS — H43821 Vitreomacular adhesion, right eye: Secondary | ICD-10-CM

## 2021-07-11 MED ORDER — RANIBIZUMAB 0.5 MG/0.05ML IZ SOSY
0.5000 mg | PREFILLED_SYRINGE | INTRAVITREAL | Status: AC | PRN
  Administered 2021-07-11: .5 mg via INTRAVITREAL

## 2021-07-11 NOTE — Progress Notes (Signed)
? ? ?07/11/2021 ? ?  ? ?CHIEF COMPLAINT ?Patient presents for  ?Chief Complaint  ?Patient presents with  ? Macular Degeneration  ? ? ? ? ?HISTORY OF PRESENT ILLNESS: ?[name] is a 76 y.o. female who presents to the clinic today for:  ? ?HPI   ?OS, with good vision yet now with twisted vision and distortions particularly, onset after most recent injection. ?Last edited by Hurman Horn, MD on 07/11/2021 10:19 AM.  ?  ? ? ?Referring physician: ?Ferd Hibbs, NP ?[place] ?[place],  [place] [address] ? ?HISTORICAL INFORMATION:  ? ?Selected notes from the [facility] ?  ? ?Lab Results  ?Component Value Date  ? HGBA1C 7.0 (H) 04/14/2020  ?  ? ?CURRENT MEDICATIONS: ?Current Outpatient Medications (Ophthalmic Drugs)  ?Medication Sig  ? Brimonidine Tartrate-Timolol (COMBIGAN OP) Place 1 drop into both eyes 2 (two) times daily.  ? Polyethyl Glycol-Propyl Glycol (SYSTANE OP) Place 1 drop into both eyes daily as needed (Dry eye).  ? ?No current facility-administered medications for this visit. (Ophthalmic Drugs)  ? ?Current Outpatient Medications (Other)  ?Medication Sig  ? albuterol (VENTOLIN HFA) 108 (90 Base) MCG/ACT inhaler Inhale 1 puff into the lungs every 6 (six) hours as needed for wheezing or shortness of breath.  ? Ascorbic Acid (VITAMIN C) 1000 MG tablet Take 1,000 mg by mouth daily.  ? aspirin 81 MG tablet Take 1 tablet (81 mg total) by mouth daily.  ? Cholecalciferol (VITAMIN D) 50 MCG (2000 UT) CAPS Take 2,000 Units by mouth daily.  ? fluticasone (FLONASE) 50 MCG/ACT nasal spray Place 1 spray into both nostrils daily. (Patient taking differently: Place 1 spray into both nostrils every other day.)  ? glucose blood test strip Use as instructed  ? HYDROcodone-acetaminophen (NORCO/VICODIN) 5-325 MG tablet Take 1 tablet by mouth every 4 (four) hours as needed for moderate pain ((score 4 to 6)).  ? lisinopril (ZESTRIL) 10 MG tablet Take 10 mg by mouth daily.  ? loratadine (CLARITIN) 10 MG tablet  Take 1 tablet (10 mg total) by mouth daily. (Patient taking differently: Take 10 mg by mouth every other day.)  ? metFORMIN (GLUCOPHAGE) 500 MG tablet Take 1 tablet (500 mg total) by mouth daily with breakfast. (Patient taking differently: Take 500 mg by mouth 2 (two) times daily with a meal.)  ? methocarbamol (ROBAXIN) 500 MG tablet Take 1 tablet (500 mg total) by mouth every 6 (six) hours as needed for muscle spasms.  ? metoprolol succinate (TOPROL-XL) 25 MG 24 hr tablet Take 1 tablet by mouth once daily  ? OVER THE COUNTER MEDICATION Take 1 tablet by mouth 2 (two) times daily. advanced eye health complex  ? simvastatin (ZOCOR) 20 MG tablet TAKE 1 TABLET BY MOUTH AT BEDTIME  ? Turmeric Curcumin 500 MG CAPS Take 500 mg by mouth daily.  ? Zinc 50 MG TABS Take 50 mg by mouth daily.  ? ?Current Facility-Administered Medications (Other)  ?Medication Route  ? EPINEPHrine (Anaphylaxis) SOLN 0.3 mg Intramuscular  ? ? ? ? ?REVIEW OF SYSTEMS: ?ROS   ?Negative for: Constitutional, Gastrointestinal, Neurological, Skin, Genitourinary, Musculoskeletal, HENT, Endocrine, Cardiovascular, Eyes, Respiratory, Psychiatric, Allergic/Imm, Heme/Lymph ?Last edited by Hurman Horn, MD on 07/11/2021 10:19 AM.  ?  ? ? ? ?ALLERGIES ?Allergies  ?Allergen Reactions  ? Peanut (Diagnostic) Cough, Hypertension and Shortness Of Breath  ? Adhesive [Tape] Rash  ? Atorvastatin Other (See Comments)  ?  Myalgia  ? Crestor [Rosuvastatin] Other (See Comments)  ?  Severe arthralgia and myalgia  ? Latex Itching and Other (See Comments)  ?  Causes blisters  ? Lovastatin Other (See Comments)  ?  Arthralgias, hyperglycemia with lovastatin.  ? ? ?PAST MEDICAL HISTORY ?Past Medical History:  ?Diagnosis Date  ? Allergic rhinitis 03/23/2013  ? Allergy   ? Anemia   ? due to vaginal bleeding  ? Arthritis   ? hands  ? Asthmatic bronchitis 09/03/2017  ? Breast cancer ([facility]) 08/2012  ? left  ? Dental crowns present   ? Diabetes mellitus without complication ([facility])   ?  Eczema   ? Fibroids   ? Glaucoma   ? Headache(784.0)   ? tension  ? History of endometriosis   ? Hordeolum externum right upper eyelid 07/25/2020  ? New finding OD today not noticed by the patient prior  ? Hyperlipidemia   ? Hypertension   ? under control with med., has been on med. x 3 yr.  ? Nuclear sclerotic cataract of right eye 12/02/2019  ? OSA (obstructive sleep apnea)   ? Paroxysmal SVT (supraventricular tachycardia) (HCC)   ? Postmenopausal bleeding   ? Recurrent upper respiratory infection (URI)   ? S/P radiation therapy 10/20/2012-12/05/2012  ? left breast 50.4 gray, lumpectomy cavity boosted to 62.4 gray  ? Seasonal allergies   ? Sleep apnea   ? uses CPAP nightly  ? Spinal stenosis   ? Urinary urgency   ? White coat hypertension   ? ?Past Surgical History:  ?Procedure Laterality Date  ? [place]  ? lumbar  ? BREAST LUMPECTOMY WITH NEEDLE LOCALIZATION AND AXILLARY SENTINEL LYMPH NODE BX Left 09/03/2012  ? Procedure: BREAST LUMPECTOMY WITH NEEDLE LOCALIZATION AND AXILLARY SENTINEL LYMPH NODE BX;  Surgeon: Edward Jolly, MD;  Location: [facility];  Service: General;  Laterality: Left;  ? CHOLECYSTECTOMY  1995  ? COLONOSCOPY W/ POLYPECTOMY  03/20/2007  ? DILATION AND CURETTAGE OF UTERUS  2005  ? DILATION AND CURETTAGE OF UTERUS  06/22/2011  ? Procedure: DILATATION AND CURETTAGE;  Surgeon: Alvino Chapel, MD;  Location: [facility];  Service: Gynecology;  Laterality: N/A;  OK PER KEELA FOR 7:15 START  ? DORSAL COMPARTMENT RELEASE Right 05/19/2013  ? Procedure: RELEASE 1ST  DORSAL COMPARTMENT RIGHT (DEQUERVAIN);  Surgeon: Cammie Sickle., MD;  Location: [facility];  Service: Orthopedics;  Laterality: Right;  ? EXPLORATORY LAPAROTOMY  06/30/2007  ? ATTEMPTED HYSTERECTOMY ABORTED DUE TO EXTENSIVE ENDOMETRIOSIS W/ DENSE PELVIC ADHESIONS INVOLVING UTERUS AND LOWER RECTOSIGMOID  ? EYE SURGERY Bilateral 2022  ? cataracts  ? HYSTEROSCOPY WITH D &  C  05/06/2009  ? TRANSFORAMINAL LUMBAR INTERBODY FUSION W/ MIS 1 LEVEL Right 06/08/2021  ? Procedure: Mininmally Invasive , Decompression Transforaminal Lumbar Interbody Fusion Lumbar four-five;  Surgeon: Dawley, Theodoro Doing, DO;  Location: [facility];  Service: Neurosurgery;  Laterality: Right;  ? ? ?FAMILY HISTORY ?Family History  ?Problem Relation Age of Onset  ? Vision loss Mother   ? Hypertension Mother   ? Anesthesia problems Mother   ?     post-op N/V  ? Heart disease Mother   ?     carotid stenosis s/p CAE  ? Osteoporosis Mother   ? Eczema Mother   ? Depression Father   ? Heart disease Sister 67  ?     arrhythmia  ? Hypertension Sister   ? Hyperlipidemia Sister   ? Diabetes Brother   ? Hypertension Brother   ? Cancer  Brother   ?     skin  ? Hyperlipidemia Brother   ? Stroke Brother   ? Kidney failure Maternal Grandmother   ? Heart disease Maternal Grandfather   ? COPD Daughter   ? Atopy Daughter   ? Cancer Cousin   ? Hypertension Other   ? Colon cancer Neg Hx   ? Allergic rhinitis Neg Hx   ? Asthma Neg Hx   ? Urticaria Neg Hx   ? ? ?SOCIAL HISTORY ?Social History  ? ?Tobacco Use  ? Smoking status: Never  ? Smokeless tobacco: Never  ?Vaping Use  ? Vaping Use: Never used  ?Substance Use Topics  ? Alcohol use: No  ?  Alcohol/week: 0.0 standard drinks  ? Drug use: No  ? ?  ? ?  ? ?OPHTHALMIC EXAM: ? ?Base Eye Exam   ? ? Visual Acuity (ETDRS)   ? ?   Right Left  ? Dist [place] 20/20 20/20  ? ?  ?  ? ? Pupils   ? ?   Pupils APD  ? Right PERRL None  ? Left PERRL None  ? ?  ?  ? ? Visual Fields   ? ?   Left Right  ?  Full Full  ? ?  ?  ? ? Neuro/Psych   ? ? Oriented x3: Yes  ? Mood/Affect: Normal  ? ?  ?  ? ?  ? ?Slit Lamp and Fundus Exam   ? ? External Exam   ? ?   Right Left  ? External Normal Normal  ? ?  ?  ? ? Slit Lamp Exam   ? ?   Right Left  ? Lids/Lashes Normal Normal  ? Conjunctiva/Sclera White and quiet White and quiet  ? Cornea Clear Clear  ? Anterior Chamber Deep and quiet Deep and quiet  ? Iris Round and reactive  Round and reactive  ? Lens Centered posterior chamber intraocular lens Centered posterior chamber intraocular lens  ? Anterior Vitreous Normal Normal  ? ?  ?  ? ? Fundus Exam   ? ?   Right Left  ? Posterior

## 2021-07-11 NOTE — Assessment & Plan Note (Signed)
OS, with subretinal fluid slightly improved overall since onset use of Lucentis however still active, repeat injection today and may consider change to Vabysmo next ?

## 2021-07-11 NOTE — Assessment & Plan Note (Signed)
Persistent OD now with a tiny subretinal elevation but noCNVM noted. ?

## 2021-08-21 ENCOUNTER — Encounter (INDEPENDENT_AMBULATORY_CARE_PROVIDER_SITE_OTHER): Payer: Self-pay | Admitting: Ophthalmology

## 2021-08-21 ENCOUNTER — Ambulatory Visit (INDEPENDENT_AMBULATORY_CARE_PROVIDER_SITE_OTHER): Payer: Medicare Other | Admitting: Ophthalmology

## 2021-08-21 DIAGNOSIS — H353132 Nonexudative age-related macular degeneration, bilateral, intermediate dry stage: Secondary | ICD-10-CM

## 2021-08-21 DIAGNOSIS — G4733 Obstructive sleep apnea (adult) (pediatric): Secondary | ICD-10-CM | POA: Diagnosis not present

## 2021-08-21 DIAGNOSIS — H353221 Exudative age-related macular degeneration, left eye, with active choroidal neovascularization: Secondary | ICD-10-CM

## 2021-08-21 DIAGNOSIS — Z9989 Dependence on other enabling machines and devices: Secondary | ICD-10-CM

## 2021-08-21 DIAGNOSIS — H35722 Serous detachment of retinal pigment epithelium, left eye: Secondary | ICD-10-CM

## 2021-08-21 MED ORDER — FARICIMAB-SVOA 6 MG/0.05ML IZ SOLN
6.0000 mg | INTRAVITREAL | Status: AC | PRN
  Administered 2021-08-21: 6 mg via INTRAVITREAL

## 2021-08-21 NOTE — Progress Notes (Signed)
08/21/2021     CHIEF COMPLAINT Patient presents for  Chief Complaint  Patient presents with   Macular Degeneration      HISTORY OF PRESENT ILLNESS: [name] is a 76 y.o. female who presents to the clinic today for:   HPI   5 weeks for DILATE OS, VABYSMO, OCT. Pt stated no changes in vision. Pt denies floaters and FOL.  Last edited by Silvestre Moment on 08/21/2021  8:58 AM.      Referring physician: Ferd Hibbs, NP [place],  [place] [address]  HISTORICAL INFORMATION:   Selected notes from the MEDICAL RECORD NUMBER    Lab Results  Component Value Date   HGBA1C 7.0 (H) 04/14/2020     CURRENT MEDICATIONS: Current Outpatient Medications (Ophthalmic Drugs)  Medication Sig   Brimonidine Tartrate-Timolol (COMBIGAN OP) Place 1 drop into both eyes 2 (two) times daily.   Polyethyl Glycol-Propyl Glycol (SYSTANE OP) Place 1 drop into both eyes daily as needed (Dry eye).   No current facility-administered medications for this visit. (Ophthalmic Drugs)   Current Outpatient Medications (Other)  Medication Sig   albuterol (VENTOLIN HFA) 108 (90 Base) MCG/ACT inhaler Inhale 1 puff into the lungs every 6 (six) hours as needed for wheezing or shortness of breath.   Ascorbic Acid (VITAMIN C) 1000 MG tablet Take 1,000 mg by mouth daily.   aspirin 81 MG tablet Take 1 tablet (81 mg total) by mouth daily.   Cholecalciferol (VITAMIN D) 50 MCG (2000 UT) CAPS Take 2,000 Units by mouth daily.   fluticasone (FLONASE) 50 MCG/ACT nasal spray Place 1 spray into both nostrils daily. (Patient taking differently: Place 1 spray into both nostrils every other day.)   glucose blood test strip Use as instructed   HYDROcodone-acetaminophen (NORCO/VICODIN) 5-325 MG tablet Take 1 tablet by mouth every 4 (four) hours as needed for moderate pain ((score 4 to 6)).   lisinopril (ZESTRIL) 10 MG tablet Take 10 mg by mouth daily.   loratadine (CLARITIN) 10 MG tablet Take 1 tablet (10 mg total)  by mouth daily. (Patient taking differently: Take 10 mg by mouth every other day.)   metFORMIN (GLUCOPHAGE) 500 MG tablet Take 1 tablet (500 mg total) by mouth daily with breakfast. (Patient taking differently: Take 500 mg by mouth 2 (two) times daily with a meal.)   methocarbamol (ROBAXIN) 500 MG tablet Take 1 tablet (500 mg total) by mouth every 6 (six) hours as needed for muscle spasms.   metoprolol succinate (TOPROL-XL) 25 MG 24 hr tablet Take 1 tablet by mouth once daily   OVER THE COUNTER MEDICATION Take 1 tablet by mouth 2 (two) times daily. advanced eye health complex   simvastatin (ZOCOR) 20 MG tablet TAKE 1 TABLET BY MOUTH AT BEDTIME   Turmeric Curcumin 500 MG CAPS Take 500 mg by mouth daily.   Zinc 50 MG TABS Take 50 mg by mouth daily.   Current Facility-Administered Medications (Other)  Medication Route   EPINEPHrine (Anaphylaxis) SOLN 0.3 mg Intramuscular      REVIEW OF SYSTEMS: ROS   Negative for: Constitutional, Gastrointestinal, Neurological, Skin, Genitourinary, Musculoskeletal, HENT, Endocrine, Cardiovascular, Eyes, Respiratory, Psychiatric, Allergic/Imm, Heme/Lymph Last edited by Silvestre Moment on 08/21/2021  8:58 AM.       ALLERGIES Allergies  Allergen Reactions   Peanut (Diagnostic) Cough, Hypertension and Shortness Of Breath   Adhesive [Tape] Rash   Atorvastatin Other (See Comments)    Myalgia   Crestor [Rosuvastatin] Other (See Comments)  Severe arthralgia and myalgia   Latex Itching and Other (See Comments)    Causes blisters   Lovastatin Other (See Comments)    Arthralgias, hyperglycemia with lovastatin.    PAST MEDICAL HISTORY Past Medical History:  Diagnosis Date   Allergic rhinitis 03/23/2013   Allergy    Anemia    due to vaginal bleeding   Arthritis    hands   Asthmatic bronchitis 09/03/2017   Breast cancer ([facility]) 08/2012   left   Dental crowns present    Diabetes mellitus without complication ([facility])    Eczema    Fibroids    Glaucoma     Headache(784.0)    tension   History of endometriosis    Hordeolum externum right upper eyelid 07/25/2020   New finding OD today not noticed by the patient prior   Hyperlipidemia    Hypertension    under control with med., has been on med. x 3 yr.   Nuclear sclerotic cataract of right eye 12/02/2019   OSA (obstructive sleep apnea)    Paroxysmal SVT (supraventricular tachycardia) (HCC)    Postmenopausal bleeding    Recurrent upper respiratory infection (URI)    S/P radiation therapy 10/20/2012-12/05/2012   left breast 50.4 gray, lumpectomy cavity boosted to 62.4 gray   Seasonal allergies    Sleep apnea    uses CPAP nightly   Spinal stenosis    Urinary urgency    White coat hypertension    Past Surgical History:  Procedure Laterality Date   BACK SURGERY  1997   lumbar   BREAST LUMPECTOMY WITH NEEDLE LOCALIZATION AND AXILLARY SENTINEL LYMPH NODE BX Left 09/03/2012   Procedure: BREAST LUMPECTOMY WITH NEEDLE LOCALIZATION AND AXILLARY SENTINEL LYMPH NODE BX;  Surgeon: Edward Jolly, MD;  Location: [facility];  Service: General;  Laterality: Left;   CHOLECYSTECTOMY  1995   COLONOSCOPY W/ POLYPECTOMY  03/20/2007   DILATION AND CURETTAGE OF UTERUS  2005   DILATION AND CURETTAGE OF UTERUS  06/22/2011   Procedure: DILATATION AND CURETTAGE;  Surgeon: Alvino Chapel, MD;  Location: [facility];  Service: Gynecology;  Laterality: N/A;  OK PER KEELA FOR 7:15 START   DORSAL COMPARTMENT RELEASE Right 05/19/2013   Procedure: RELEASE 1ST  DORSAL COMPARTMENT RIGHT (DEQUERVAIN);  Surgeon: Cammie Sickle., MD;  Location[facility];  Service: Orthopedics;  Laterality: Right;   EXPLORATORY LAPAROTOMY  06/30/2007   ATTEMPTED HYSTERECTOMY ABORTED DUE TO EXTENSIVE ENDOMETRIOSIS W/ DENSE PELVIC ADHESIONS INVOLVING UTERUS AND LOWER RECTOSIGMOID   EYE SURGERY Bilateral 2022   cataracts   HYSTEROSCOPY WITH D & C  05/06/2009   TRANSFORAMINAL  LUMBAR INTERBODY FUSION W/ MIS 1 LEVEL Right 06/08/2021   Procedure: Mininmally Invasive , Decompression Transforaminal Lumbar Interbody Fusion Lumbar four-five;  Surgeon: Dawley, Theodoro Doing, DO;  Location: [facility];  Service: Neurosurgery;  Laterality: Right;    FAMILY HISTORY Family History  Problem Relation Age of Onset   Vision loss Mother    Hypertension Mother    Anesthesia problems Mother        post-op N/V   Heart disease Mother        carotid stenosis s/p CAE   Osteoporosis Mother    Eczema Mother    Depression Father    Heart disease Sister 31       arrhythmia   Hypertension Sister    Hyperlipidemia Sister    Diabetes Brother    Hypertension Brother    Cancer  Brother        skin   Hyperlipidemia Brother    Stroke Brother    Kidney failure Maternal Grandmother    Heart disease Maternal Grandfather    COPD Daughter    Atopy Daughter    Cancer Cousin    Hypertension Other    Colon cancer Neg Hx    Allergic rhinitis Neg Hx    Asthma Neg Hx    Urticaria Neg Hx     SOCIAL HISTORY Social History   Tobacco Use   Smoking status: Never   Smokeless tobacco: Never  Vaping Use   Vaping Use: Never used  Substance Use Topics   Alcohol use: No    Alcohol/week: 0.0 standard drinks   Drug use: No         OPHTHALMIC EXAM:  Base Eye Exam     Visual Acuity (ETDRS)       Right Left   Dist [place] 20/20 20/20 -2         Tonometry (Tonopen, 9:03 AM)       Right Left   Pressure 15 13         Pupils       Pupils APD   Right PERRL None   Left PERRL None         Visual Fields       Left Right    Full Full         Extraocular Movement       Right Left    Full Full         Neuro/Psych     Oriented x3: Yes   Mood/Affect: Normal         Dilation     Left eye: 2.5% Phenylephrine, 1.0% Mydriacyl @ 9:03 AM           Slit Lamp and Fundus Exam     External Exam       Right Left   External Normal Normal         Slit Lamp Exam        Right Left   Lids/Lashes Normal Normal   Conjunctiva/Sclera White and quiet White and quiet   Cornea Clear Clear   Anterior Chamber Deep and quiet Deep and quiet   Iris Round and reactive Round and reactive   Lens Centered posterior chamber intraocular lens Centered posterior chamber intraocular lens   Anterior Vitreous Normal Normal         Fundus Exam       Right Left   Posterior Vitreous  Posterior vitreous detachment?, Vitreous debris, no Weiss ring the   Disc  Peripapillary atrophy   C/D Ratio  0.25   Macula  Retinal pigment epithelial mottling, no exudates, no hemorrhage, macular thickening, Early age related macular degeneration, Pigmented atrophy, nonturbid subretinal fluid   Vessels  Normal, , no DR   Periphery  Normal            IMAGING AND PROCEDURES  Imaging and Procedures for 08/21/21  OCT, Retina - OU - Both Eyes       Right Eye Quality was good. Scan locations included subfoveal. Central Foveal Thickness: 251. Progression has been stable. Findings include retinal drusen , no IRF, no SRF, vitreomacular adhesion , vitreous traction.   Left Eye Quality was good. Scan locations included subfoveal. Central Foveal Thickness: 265. Progression has been stable. Findings include subretinal fluid, pigment epithelial detachment, no IRF.   Notes Looks less subretinal subfoveal fluid today  post Lucentis.  We will repeat injection OS today and examination again in 5 weeks, will change to Vabysmo  OD, incidental partial PVD with VMA to macula remains     Intravitreal Injection, Pharmacologic Agent - OS - Left Eye       Time Out 08/21/2021. 9:49 AM. Confirmed correct patient, procedure, site, and patient consented.   Anesthesia Topical anesthesia was used. Anesthetic medications included Lidocaine 4%.   Procedure Preparation included Tobramycin 0.3%, 5% betadine to ocular surface, 10% betadine to eyelids. A 30 gauge needle was used.   Injection: 6 mg  faricimab-svoa 6 MG/0.05ML   Route: Intravitreal, Site: Left Eye   NDC: [id number], Lot: [id number], Expiration date: 02/17/2023, Waste: 0 mL   Post-op Post injection exam found visual acuity of at least counting fingers. The patient tolerated the procedure well. There were no complications. The patient received written and verbal post procedure care education. Post injection medications included ocuflox.              ASSESSMENT/PLAN:  Obstructive sleep apnea on CPAP Continues with excellent compliance on CPAP  Intermediate stage nonexudative age-related macular degeneration of both eyes No sign of CNVM OD today  Exudative age-related macular degeneration of left eye with active choroidal neovascularization (HCC) Chronic active CNVM persistence of subretinal fluid, post injection Lucentis x3 injections now change to Vabysmo  Serous detachment of retinal pigment epithelium of left eye Component of wet AMD     ICD-10-CM   1. Exudative age-related macular degeneration of left eye with active choroidal neovascularization (HCC)  H35.3221 OCT, Retina - OU - Both Eyes    Intravitreal Injection, Pharmacologic Agent - OS - Left Eye    faricimab-svoa (VABYSMO) '6mg'$ /0.68m intravitreal injection    2. Obstructive sleep apnea on CPAP  G47.33    Z99.89     3. Intermediate stage nonexudative age-related macular degeneration of both eyes  H35.3132     4. Serous detachment of retinal pigment epithelium of left eye  H35.722       1.  OS with preserved and good acuity yet with still active subretinal fluid from CNVM.  Repeat injection into vegF today however change to Vabysmo as proven resistance to Avastin and now Lucentis.  Injection Vabysmo OS today #1  2.  3.  Ophthalmic Meds Ordered this visit:  Meds ordered this encounter  Medications   faricimab-svoa (VABYSMO) '6mg'$ /0.034mintravitreal injection       Return in about 5 weeks (around 09/25/2021) for dilate, OS, VABYSMO  OCT.  There are no Patient Instructions on file for this visit.   Explained the diagnoses, plan, and follow up with the patient and they expressed understanding.  Patient expressed understanding of the importance of proper follow up care.   GaClent Demarkankin M.D. Diseases & Surgery of the Retina and Vitreous Retina & Diabetic EyBoiling Springs6/05/23     Abbreviations: M myopia (nearsighted); A astigmatism; H hyperopia (farsighted); P presbyopia; Mrx spectacle prescription;  CTL contact lenses; OD right eye; OS left eye; OU both eyes  XT exotropia; ET esotropia; PEK punctate epithelial keratitis; PEE punctate epithelial erosions; DES dry eye syndrome; MGD meibomian gland dysfunction; ATs artificial tears; PFAT's preservative free artificial tears; NSClarendonuclear sclerotic cataract; PSC posterior subcapsular cataract; ERM epi-retinal membrane; PVD posterior vitreous detachment; RD retinal detachment; DM diabetes mellitus; DR diabetic retinopathy; NPDR non-proliferative diabetic retinopathy; PDR proliferative diabetic retinopathy; CSME clinically significant macular edema; DME diabetic macular edema; dbh dot blot hemorrhages; CWS cotton  wool spot; POAG primary open angle glaucoma; C/D cup-to-disc ratio; HVF humphrey visual field; GVF goldmann visual field; OCT optical coherence tomography; IOP intraocular pressure; BRVO Branch retinal vein occlusion; CRVO central retinal vein occlusion; CRAO central retinal artery occlusion; BRAO branch retinal artery occlusion; RT retinal tear; SB scleral buckle; PPV pars plana vitrectomy; VH Vitreous hemorrhage; PRP panretinal laser photocoagulation; IVK intravitreal kenalog; VMT vitreomacular traction; MH Macular hole;  NVD neovascularization of the disc; NVE neovascularization elsewhere; AREDS age related eye disease study; ARMD age related macular degeneration; POAG primary open angle glaucoma; EBMD epithelial/anterior basement membrane dystrophy; ACIOL anterior chamber  intraocular lens; IOL intraocular lens; PCIOL posterior chamber intraocular lens; Phaco/IOL phacoemulsification with intraocular lens placement; [place] photorefractive keratectomy; LASIK laser assisted in situ keratomileusis; HTN hypertension; DM diabetes mellitus; COPD chronic obstructive pulmonary disease

## 2021-08-21 NOTE — Assessment & Plan Note (Signed)
Component of wet AMD 

## 2021-08-21 NOTE — Assessment & Plan Note (Signed)
No sign of CNVM OD today °

## 2021-08-21 NOTE — Assessment & Plan Note (Signed)
Continues with excellent compliance on CPAP

## 2021-08-21 NOTE — Assessment & Plan Note (Signed)
Chronic active CNVM persistence of subretinal fluid, post injection Lucentis x3 injections now change to [place]

## 2021-09-09 ENCOUNTER — Other Ambulatory Visit: Payer: Self-pay | Admitting: Cardiology

## 2021-09-25 ENCOUNTER — Ambulatory Visit (INDEPENDENT_AMBULATORY_CARE_PROVIDER_SITE_OTHER): Payer: Medicare Other | Admitting: Ophthalmology

## 2021-09-25 ENCOUNTER — Encounter (INDEPENDENT_AMBULATORY_CARE_PROVIDER_SITE_OTHER): Payer: Self-pay | Admitting: Ophthalmology

## 2021-09-25 DIAGNOSIS — G4733 Obstructive sleep apnea (adult) (pediatric): Secondary | ICD-10-CM

## 2021-09-25 DIAGNOSIS — H353132 Nonexudative age-related macular degeneration, bilateral, intermediate dry stage: Secondary | ICD-10-CM | POA: Diagnosis not present

## 2021-09-25 DIAGNOSIS — H43821 Vitreomacular adhesion, right eye: Secondary | ICD-10-CM

## 2021-09-25 DIAGNOSIS — H353221 Exudative age-related macular degeneration, left eye, with active choroidal neovascularization: Secondary | ICD-10-CM

## 2021-09-25 DIAGNOSIS — Z9989 Dependence on other enabling machines and devices: Secondary | ICD-10-CM

## 2021-09-25 MED ORDER — FARICIMAB-SVOA 6 MG/0.05ML IZ SOLN
6.0000 mg | INTRAVITREAL | Status: AC | PRN
  Administered 2021-09-25: 6 mg via INTRAVITREAL

## 2021-09-25 NOTE — Progress Notes (Signed)
09/25/2021     CHIEF COMPLAINT Patient presents for  Chief Complaint  Patient presents with   Macular Degeneration      HISTORY OF PRESENT ILLNESS: [name] is a 76 y.o. female who presents to the clinic today for:   HPI   OS with history of wet AMD, resistant to multiple medications in the past. Last edited by Hurman Horn, MD on 09/25/2021 10:43 AM.      Referring physician: Ferd Hibbs, NP [place],  [place] [address]  HISTORICAL INFORMATION:   Selected notes from the MEDICAL RECORD NUMBER    Lab Results  Component Value Date   HGBA1C 7.0 (H) 04/14/2020     CURRENT MEDICATIONS: Current Outpatient Medications (Ophthalmic Drugs)  Medication Sig   Brimonidine Tartrate-Timolol (COMBIGAN OP) Place 1 drop into both eyes 2 (two) times daily.   Polyethyl Glycol-Propyl Glycol (SYSTANE OP) Place 1 drop into both eyes daily as needed (Dry eye).   No current facility-administered medications for this visit. (Ophthalmic Drugs)   Current Outpatient Medications (Other)  Medication Sig   albuterol (VENTOLIN HFA) 108 (90 Base) MCG/ACT inhaler Inhale 1 puff into the lungs every 6 (six) hours as needed for wheezing or shortness of breath.   Ascorbic Acid (VITAMIN C) 1000 MG tablet Take 1,000 mg by mouth daily.   aspirin 81 MG tablet Take 1 tablet (81 mg total) by mouth daily.   Cholecalciferol (VITAMIN D) 50 MCG (2000 UT) CAPS Take 2,000 Units by mouth daily.   fluticasone (FLONASE) 50 MCG/ACT nasal spray Place 1 spray into both nostrils daily. (Patient taking differently: Place 1 spray into both nostrils every other day.)   glucose blood test strip Use as instructed   HYDROcodone-acetaminophen (NORCO/VICODIN) 5-325 MG tablet Take 1 tablet by mouth every 4 (four) hours as needed for moderate pain ((score 4 to 6)).   lisinopril (ZESTRIL) 10 MG tablet Take 10 mg by mouth daily.   loratadine (CLARITIN) 10 MG tablet Take 1 tablet (10 mg total) by mouth  daily. (Patient taking differently: Take 10 mg by mouth every other day.)   metFORMIN (GLUCOPHAGE) 500 MG tablet Take 1 tablet (500 mg total) by mouth daily with breakfast. (Patient taking differently: Take 500 mg by mouth 2 (two) times daily with a meal.)   methocarbamol (ROBAXIN) 500 MG tablet Take 1 tablet (500 mg total) by mouth every 6 (six) hours as needed for muscle spasms.   metoprolol succinate (TOPROL-XL) 25 MG 24 hr tablet Take 1 tablet by mouth once daily   OVER THE COUNTER MEDICATION Take 1 tablet by mouth 2 (two) times daily. advanced eye health complex   simvastatin (ZOCOR) 20 MG tablet TAKE 1 TABLET BY MOUTH AT BEDTIME   Turmeric Curcumin 500 MG CAPS Take 500 mg by mouth daily.   Zinc 50 MG TABS Take 50 mg by mouth daily.   Current Facility-Administered Medications (Other)  Medication Route   EPINEPHrine (Anaphylaxis) SOLN 0.3 mg Intramuscular      REVIEW OF SYSTEMS: ROS   Positive for: Skin Negative for: Constitutional, Gastrointestinal, Neurological, Genitourinary, Musculoskeletal, HENT, Endocrine, Cardiovascular, Eyes, Respiratory, Psychiatric, Allergic/Imm, Heme/Lymph Last edited by Hurman Horn, MD on 09/25/2021 10:04 AM.       ALLERGIES Allergies  Allergen Reactions   Peanut (Diagnostic) Cough, Hypertension and Shortness Of Breath   Adhesive [Tape] Rash   Atorvastatin Other (See Comments)    Myalgia   Crestor [Rosuvastatin] Other (See Comments)  Severe arthralgia and myalgia   Latex Itching and Other (See Comments)    Causes blisters   Lovastatin Other (See Comments)    Arthralgias, hyperglycemia with lovastatin.    PAST MEDICAL HISTORY Past Medical History:  Diagnosis Date   Allergic rhinitis 03/23/2013   Allergy    Anemia    due to vaginal bleeding   Arthritis    hands   Asthmatic bronchitis 09/03/2017   Breast cancer ([facility]) 08/2012   left   Dental crowns present    Diabetes mellitus without complication ([facility])    Eczema    Fibroids     Glaucoma    Headache(784.0)    tension   History of endometriosis    Hordeolum externum right upper eyelid 07/25/2020   New finding OD today not noticed by the patient prior   Hyperlipidemia    Hypertension    under control with med., has been on med. x 3 yr.   Nuclear sclerotic cataract of right eye 12/02/2019   OSA (obstructive sleep apnea)    Paroxysmal SVT (supraventricular tachycardia) (HCC)    Postmenopausal bleeding    Recurrent upper respiratory infection (URI)    S/P radiation therapy 10/20/2012-12/05/2012   left breast 50.4 gray, lumpectomy cavity boosted to 62.4 gray   Seasonal allergies    Sleep apnea    uses CPAP nightly   Spinal stenosis    Urinary urgency    White coat hypertension    Past Surgical History:  Procedure Laterality Date   BACK SURGERY  1997   lumbar   BREAST LUMPECTOMY WITH NEEDLE LOCALIZATION AND AXILLARY SENTINEL LYMPH NODE BX Left 09/03/2012   Procedure: BREAST LUMPECTOMY WITH NEEDLE LOCALIZATION AND AXILLARY SENTINEL LYMPH NODE BX;  Surgeon: Edward Jolly, MD;  Location: [facility];  Service: General;  Laterality: Left;   CHOLECYSTECTOMY  1995   COLONOSCOPY W/ POLYPECTOMY  03/20/2007   DILATION AND CURETTAGE OF UTERUS  2005   DILATION AND CURETTAGE OF UTERUS  06/22/2011   Procedure: DILATATION AND CURETTAGE;  Surgeon: Alvino Chapel, MD;  Location: [facility];  Service: Gynecology;  Laterality: N/A;  OK PER KEELA FOR 7:15 START   DORSAL COMPARTMENT RELEASE Right 05/19/2013   Procedure: RELEASE 1ST  DORSAL COMPARTMENT RIGHT (DEQUERVAIN);  Surgeon: Cammie Sickle., MD;  Location: [facility];  Service: Orthopedics;  Laterality: Right;   EXPLORATORY LAPAROTOMY  06/30/2007   ATTEMPTED HYSTERECTOMY ABORTED DUE TO EXTENSIVE ENDOMETRIOSIS W/ DENSE PELVIC ADHESIONS INVOLVING UTERUS AND LOWER RECTOSIGMOID   EYE SURGERY Bilateral 2022   cataracts   HYSTEROSCOPY WITH D & C  05/06/2009    TRANSFORAMINAL LUMBAR INTERBODY FUSION W/ MIS 1 LEVEL Right 06/08/2021   Procedure: Mininmally Invasive , Decompression Transforaminal Lumbar Interbody Fusion Lumbar four-five;  Surgeon: Dawley, Theodoro Doing, DO;  Location: [facility];  Service: Neurosurgery;  Laterality: Right;    FAMILY HISTORY Family History  Problem Relation Age of Onset   Vision loss Mother    Hypertension Mother    Anesthesia problems Mother        post-op N/V   Heart disease Mother        carotid stenosis s/p CAE   Osteoporosis Mother    Eczema Mother    Depression Father    Heart disease Sister 47       arrhythmia   Hypertension Sister    Hyperlipidemia Sister    Diabetes Brother    Hypertension Brother    Cancer  Brother        skin   Hyperlipidemia Brother    Stroke Brother    Kidney failure Maternal Grandmother    Heart disease Maternal Grandfather    COPD Daughter    Atopy Daughter    Cancer Cousin    Hypertension Other    Colon cancer Neg Hx    Allergic rhinitis Neg Hx    Asthma Neg Hx    Urticaria Neg Hx     SOCIAL HISTORY Social History   Tobacco Use   Smoking status: Never   Smokeless tobacco: Never  Vaping Use   Vaping Use: Never used  Substance Use Topics   Alcohol use: No    Alcohol/week: 0.0 standard drinks of alcohol   Drug use: No         OPHTHALMIC EXAM:  Base Eye Exam     Visual Acuity (ETDRS)       Right Left   Dist [place] 20/15 -2 20/20         Tonometry (Tonopen, 10:06 AM)       Right Left   Pressure 6 16         Pupils       Pupils APD   Right PERRL None   Left PERRL None         Visual Fields       Left Right    Full Full         Extraocular Movement       Right Left    Full, Ortho Full, Ortho         Neuro/Psych     Oriented x3: Yes   Mood/Affect: Normal         Dilation     Left eye: 1.0% Mydriacyl, 2.5% Phenylephrine @ 10:06 AM           Slit Lamp and Fundus Exam     External Exam       Right Left   External Normal  Normal         Slit Lamp Exam       Right Left   Lids/Lashes Normal Normal   Conjunctiva/Sclera White and quiet White and quiet   Cornea Clear Clear   Anterior Chamber Deep and quiet Deep and quiet   Iris Round and reactive Round and reactive   Lens Centered posterior chamber intraocular lens Centered posterior chamber intraocular lens   Anterior Vitreous Normal Normal         Fundus Exam       Right Left   Posterior Vitreous  Posterior vitreous detachment?, Vitreous debris, no Weiss ring the   Disc  Peripapillary atrophy   C/D Ratio  0.25   Macula  Retinal pigment epithelial mottling, no exudates, no hemorrhage, macular thickening, Early age related macular degeneration, Pigmented atrophy, nonturbid subretinal fluid   Vessels  Normal, , no DR   Periphery  Subretinal fluid, [place]            IMAGING AND PROCEDURES  Imaging and Procedures for 09/25/21  OCT, Retina - OU - Both Eyes       Right Eye Quality was good. Scan locations included subfoveal. Central Foveal Thickness: 254. Progression has been stable. Findings include no IRF, no SRF, retinal drusen , vitreous traction, vitreomacular adhesion .   Left Eye Quality was good. Scan locations included subfoveal. Central Foveal Thickness: 263. Progression has been stable. Findings include no IRF, pigment epithelial detachment, subretinal fluid.   Notes  Looks less subretinal subfoveal fluid today post Vabysmo, the nasal aspect to FAZ, repeat injection today with significant weeks on Vabysmo OD, incidental partial PVD with VMA to macula remains     Intravitreal Injection, Pharmacologic Agent - OS - Left Eye       Time Out 09/25/2021. 10:43 AM. Confirmed correct patient, procedure, site, and patient consented.   Anesthesia Topical anesthesia was used. Anesthetic medications included Lidocaine 4%.   Procedure Preparation included 5% betadine to ocular surface, 10% betadine to eyelids, Tobramycin 0.3%. A 30  gauge needle was used.   Injection: 6 mg faricimab-svoa 6 MG/0.05ML   Route: Intravitreal, Site: Left Eye   NDC: [id number], Lot: [id number], Expiration date: 03/20/2023, Waste: 0 mL   Post-op Post injection exam found visual acuity of at least counting fingers. The patient tolerated the procedure well. There were no complications. The patient received written and verbal post procedure care education. Post injection medications included ocuflox.              ASSESSMENT/PLAN:  OSA on CPAP Excellent compliance maintained  Vitreomacular adhesion of right eye Physiologic OD  Intermediate stage nonexudative age-related macular degeneration of both eyes No sign of CNVM by OCT  Exudative age-related macular degeneration of left eye with active choroidal neovascularization (HCC) OS improved 5 weeks post it is still active and repeat injection today reevaluate in 5 weeks     ICD-10-CM   1. Exudative age-related macular degeneration of left eye with active choroidal neovascularization (HCC)  H35.3221 OCT, Retina - OU - Both Eyes    Intravitreal Injection, Pharmacologic Agent - OS - Left Eye    faricimab-svoa (VABYSMO) '6mg'$ /0.40m intravitreal injection    2. Intermediate stage nonexudative age-related macular degeneration of both eyes  H35.3132 OCT, Retina - OU - Both Eyes    3. OSA on CPAP  G47.33    Z99.89     4. Vitreomacular adhesion of right eye  H43.821       1.  OS, much less active CNVM, subretinal fluid  2.  OD no sign of CNVM  3.  Ophthalmic Meds Ordered this visit:  Meds ordered this encounter  Medications   faricimab-svoa (VABYSMO) '6mg'$ /0.053mintravitreal injection       Return in about 5 weeks (around 10/30/2021) for dilate, OS, VABYSMO OCT.  There are no Patient Instructions on file for this visit.   Explained the diagnoses, plan, and follow up with the patient and they expressed understanding.  Patient expressed understanding of the importance of  proper follow up care.   GaClent Demarkankin M.D. Diseases & Surgery of the Retina and Vitreous [facility]ina & Diabetic EyFulton7/10/23     Abbreviations: M myopia (nearsighted); A astigmatism; H hyperopia (farsighted); P presbyopia; Mrx spectacle prescription;  CTL contact lenses; OD right eye; OS left eye; OU both eyes  XT exotropia; ET esotropia; PEK punctate epithelial keratitis; PEE punctate epithelial erosions; DES dry eye syndrome; MGD meibomian gland dysfunction; ATs artificial tears; PFAT's preservative free artificial tears; [place]uclear sclerotic cataract; PSC posterior subcapsular cataract; ERM epi-retinal membrane; PVD posterior vitreous detachment; RD retinal detachment; DM diabetes mellitus; DR diabetic retinopathy; NPDR non-proliferative diabetic retinopathy; PDR proliferative diabetic retinopathy; CSME clinically significant macular edema; DME diabetic macular edema; dbh dot blot hemorrhages; CWS cotton wool spot; POAG primary open angle glaucoma; C/D cup-to-disc ratio; HVF humphrey visual field; GVF goldmann visual field; OCT optical coherence tomography; IOP intraocular pressure; BRVO Branch retinal vein occlusion; CRVO central retinal vein occlusion; CRAO  central retinal artery occlusion; BRAO branch retinal artery occlusion; RT retinal tear; SB scleral buckle; PPV pars plana vitrectomy; VH Vitreous hemorrhage; PRP panretinal laser photocoagulation; IVK intravitreal kenalog; VMT vitreomacular traction; MH Macular hole;  NVD neovascularization of the disc; NVE neovascularization elsewhere; AREDS age related eye disease study; ARMD age related macular degeneration; POAG primary open angle glaucoma; EBMD epithelial/anterior basement membrane dystrophy; ACIOL anterior chamber intraocular lens; IOL intraocular lens; PCIOL posterior chamber intraocular lens; Phaco/IOL phacoemulsification with intraocular lens placement; [place] photorefractive keratectomy; LASIK laser assisted in situ keratomileusis;  HTN hypertension; DM diabetes mellitus; COPD chronic obstructive pulmonary disease

## 2021-09-25 NOTE — Assessment & Plan Note (Signed)
Physiologic OD

## 2021-09-25 NOTE — Assessment & Plan Note (Signed)
Excellent compliance maintained

## 2021-09-25 NOTE — Assessment & Plan Note (Signed)
OS improved 5 weeks post it is still active and repeat injection today reevaluate in 5 weeks

## 2021-09-25 NOTE — Assessment & Plan Note (Signed)
No sign of CNVM by OCT

## 2021-10-30 ENCOUNTER — Ambulatory Visit (INDEPENDENT_AMBULATORY_CARE_PROVIDER_SITE_OTHER): Payer: Medicare Other | Admitting: Ophthalmology

## 2021-10-30 ENCOUNTER — Encounter (INDEPENDENT_AMBULATORY_CARE_PROVIDER_SITE_OTHER): Payer: Self-pay | Admitting: Ophthalmology

## 2021-10-30 DIAGNOSIS — E119 Type 2 diabetes mellitus without complications: Secondary | ICD-10-CM

## 2021-10-30 DIAGNOSIS — Z9989 Dependence on other enabling machines and devices: Secondary | ICD-10-CM

## 2021-10-30 DIAGNOSIS — H353221 Exudative age-related macular degeneration, left eye, with active choroidal neovascularization: Secondary | ICD-10-CM | POA: Diagnosis not present

## 2021-10-30 DIAGNOSIS — H353132 Nonexudative age-related macular degeneration, bilateral, intermediate dry stage: Secondary | ICD-10-CM

## 2021-10-30 DIAGNOSIS — G4733 Obstructive sleep apnea (adult) (pediatric): Secondary | ICD-10-CM | POA: Diagnosis not present

## 2021-10-30 MED ORDER — FARICIMAB-SVOA 6 MG/0.05ML IZ SOLN
6.0000 mg | INTRAVITREAL | Status: AC | PRN
  Administered 2021-10-30: 6 mg via INTRAVITREAL

## 2021-10-30 NOTE — Assessment & Plan Note (Signed)
Now improved subretinal fluid post change to Vabysmo OS.  Repeat injection today and maintain 1 month follow-up

## 2021-10-30 NOTE — Assessment & Plan Note (Signed)
Continue and very well compliant

## 2021-10-30 NOTE — Assessment & Plan Note (Signed)
Stable OD °

## 2021-10-30 NOTE — Assessment & Plan Note (Signed)
No detectable DR

## 2021-10-30 NOTE — Progress Notes (Signed)
10/30/2021     CHIEF COMPLAINT Patient presents for  Chief Complaint  Patient presents with   Macular Degeneration      HISTORY OF PRESENT ILLNESS: [name] is a 76 y.o. female who presents to the clinic today for:   HPI   5 weeks for DILATE OS, VABYSMO OCT. Pt stated no changes in vision since last visit. Pt reported she was seen by Dr. Midge Aver and was told that he had some concerns and have sent notes to Dr. Zadie Rhine.  Last edited by Silvestre Moment on 10/30/2021 10:15 AM.      Referring physician: Warden Fillers, MD [address] [place],  [place] [id number]  HISTORICAL INFORMATION:   Selected notes from the MEDICAL RECORD NUMBER    Lab Results  Component Value Date   HGBA1C 7.0 (H) 04/14/2020     CURRENT MEDICATIONS: Current Outpatient Medications (Ophthalmic Drugs)  Medication Sig   Brimonidine Tartrate-Timolol (COMBIGAN OP) Place 1 drop into both eyes 2 (two) times daily.   Polyethyl Glycol-Propyl Glycol (SYSTANE OP) Place 1 drop into both eyes daily as needed (Dry eye).   No current facility-administered medications for this visit. (Ophthalmic Drugs)   Current Outpatient Medications (Other)  Medication Sig   albuterol (VENTOLIN HFA) 108 (90 Base) MCG/ACT inhaler Inhale 1 puff into the lungs every 6 (six) hours as needed for wheezing or shortness of breath.   Ascorbic Acid (VITAMIN C) 1000 MG tablet Take 1,000 mg by mouth daily.   aspirin 81 MG tablet Take 1 tablet (81 mg total) by mouth daily.   Cholecalciferol (VITAMIN D) 50 MCG (2000 UT) CAPS Take 2,000 Units by mouth daily.   fluticasone (FLONASE) 50 MCG/ACT nasal spray Place 1 spray into both nostrils daily. (Patient taking differently: Place 1 spray into both nostrils every other day.)   glucose blood test strip Use as instructed   HYDROcodone-acetaminophen (NORCO/VICODIN) 5-325 MG tablet Take 1 tablet by mouth every 4 (four) hours as needed for moderate pain ((score 4 to 6)).    lisinopril (ZESTRIL) 10 MG tablet Take 10 mg by mouth daily.   loratadine (CLARITIN) 10 MG tablet Take 1 tablet (10 mg total) by mouth daily. (Patient taking differently: Take 10 mg by mouth every other day.)   metFORMIN (GLUCOPHAGE) 500 MG tablet Take 1 tablet (500 mg total) by mouth daily with breakfast. (Patient taking differently: Take 500 mg by mouth 2 (two) times daily with a meal.)   methocarbamol (ROBAXIN) 500 MG tablet Take 1 tablet (500 mg total) by mouth every 6 (six) hours as needed for muscle spasms.   metoprolol succinate (TOPROL-XL) 25 MG 24 hr tablet Take 1 tablet by mouth once daily   OVER THE COUNTER MEDICATION Take 1 tablet by mouth 2 (two) times daily. advanced eye health complex   simvastatin (ZOCOR) 20 MG tablet TAKE 1 TABLET BY MOUTH AT BEDTIME   Turmeric Curcumin 500 MG CAPS Take 500 mg by mouth daily.   Zinc 50 MG TABS Take 50 mg by mouth daily.   Current Facility-Administered Medications (Other)  Medication Route   EPINEPHrine (Anaphylaxis) SOLN 0.3 mg Intramuscular      REVIEW OF SYSTEMS: ROS   Negative for: Constitutional, Gastrointestinal, Neurological, Skin, Genitourinary, Musculoskeletal, HENT, Endocrine, Cardiovascular, Eyes, Respiratory, Psychiatric, Allergic/Imm, Heme/Lymph Last edited by Silvestre Moment on 10/30/2021 10:15 AM.       ALLERGIES Allergies  Allergen Reactions   Peanut (Diagnostic) Cough, Hypertension and Shortness Of Breath  Adhesive [Tape] Rash   Atorvastatin Other (See Comments)    Myalgia   Crestor [Rosuvastatin] Other (See Comments)    Severe arthralgia and myalgia   Latex Itching and Other (See Comments)    Causes blisters   Lovastatin Other (See Comments)    Arthralgias, hyperglycemia with lovastatin.    PAST MEDICAL HISTORY Past Medical History:  Diagnosis Date   Allergic rhinitis 03/23/2013   Allergy    Anemia    due to vaginal bleeding   Arthritis    hands   Asthmatic bronchitis 09/03/2017   Breast cancer ([place]) 08/2012    left   Dental crowns present    Diabetes mellitus without complication ([place])    Eczema    Fibroids    Glaucoma    Headache(784.0)    tension   History of endometriosis    Hordeolum externum right upper eyelid 07/25/2020   New finding OD today not noticed by the patient prior   Hyperlipidemia    Hypertension    under control with med., has been on med. x 3 yr.   Nuclear sclerotic cataract of right eye 12/02/2019   OSA (obstructive sleep apnea)    Paroxysmal SVT (supraventricular tachycardia) (HCC)    Postmenopausal bleeding    Recurrent upper respiratory infection (URI)    S/P radiation therapy 10/20/2012-12/05/2012   left breast 50.4 gray, lumpectomy cavity boosted to 62.4 gray   Seasonal allergies    Sleep apnea    uses CPAP nightly   Spinal stenosis    Urinary urgency    White coat hypertension    Past Surgical History:  Procedure Laterality Date   BACK SURGERY  1997   lumbar   BREAST LUMPECTOMY WITH NEEDLE LOCALIZATION AND AXILLARY SENTINEL LYMPH NODE BX Left 09/03/2012   Procedure: BREAST LUMPECTOMY WITH NEEDLE LOCALIZATION AND AXILLARY SENTINEL LYMPH NODE BX;  Surgeon: Edward Jolly, MD;  Location: [facility];  Service: General;  Laterality: Left;   CHOLECYSTECTOMY  1995   COLONOSCOPY W/ POLYPECTOMY  03/20/2007   DILATION AND CURETTAGE OF UTERUS  2005   DILATION AND CURETTAGE OF UTERUS  06/22/2011   Procedure: DILATATION AND CURETTAGE;  Surgeon: Alvino Chapel, MD;  Location: [facility];  Service: Gynecology;  Laterality: N/A;  OK PER KEELA FOR 7:15 START   DORSAL COMPARTMENT RELEASE Right 05/19/2013   Procedure: RELEASE 1ST  DORSAL COMPARTMENT RIGHT (DEQUERVAIN);  Surgeon: Cammie Sickle., MD;  Location: [facility];  Service: Orthopedics;  Laterality: Right;   EXPLORATORY LAPAROTOMY  06/30/2007   ATTEMPTED HYSTERECTOMY ABORTED DUE TO EXTENSIVE ENDOMETRIOSIS W/ DENSE PELVIC ADHESIONS INVOLVING UTERUS AND  LOWER RECTOSIGMOID   EYE SURGERY Bilateral 2022   cataracts   HYSTEROSCOPY WITH D & C  05/06/2009   TRANSFORAMINAL LUMBAR INTERBODY FUSION W/ MIS 1 LEVEL Right 06/08/2021   Procedure: Mininmally Invasive , Decompression Transforaminal Lumbar Interbody Fusion Lumbar four-five;  Surgeon: Dawley, Theodoro Doing, DO;  Location: [facility];  Service: Neurosurgery;  Laterality: Right;    FAMILY HISTORY Family History  Problem Relation Age of Onset   Vision loss Mother    Hypertension Mother    Anesthesia problems Mother        post-op N/V   Heart disease Mother        carotid stenosis s/p CAE   Osteoporosis Mother    Eczema Mother    Depression Father    Heart disease Sister 4       arrhythmia  Hypertension Sister    Hyperlipidemia Sister    Diabetes Brother    Hypertension Brother    Cancer Brother        skin   Hyperlipidemia Brother    Stroke Brother    Kidney failure Maternal Grandmother    Heart disease Maternal Grandfather    COPD Daughter    Atopy Daughter    Cancer Cousin    Hypertension Other    Colon cancer Neg Hx    Allergic rhinitis Neg Hx    Asthma Neg Hx    Urticaria Neg Hx     SOCIAL HISTORY Social History   Tobacco Use   Smoking status: Never   Smokeless tobacco: Never  Vaping Use   Vaping Use: Never used  Substance Use Topics   Alcohol use: No    Alcohol/week: 0.0 standard drinks of alcohol   Drug use: No         OPHTHALMIC EXAM:  Base Eye Exam     Visual Acuity (ETDRS)       Right Left   Dist [place] 20/20 -1 20/25 -2         Tonometry (Tonopen, 10:19 AM)       Right Left   Pressure 17 14         Pupils       Pupils APD   Right PERRL None   Left PERRL None         Visual Fields       Left Right    Full Full         Extraocular Movement       Right Left    Full, Ortho Full, Ortho         Neuro/Psych     Oriented x3: Yes   Mood/Affect: Normal         Dilation     Left eye: 2.5% Phenylephrine, 1.0% Mydriacyl @  10:19 AM           Slit Lamp and Fundus Exam     External Exam       Right Left   External Normal Normal         Slit Lamp Exam       Right Left   Lids/Lashes Normal Normal   Conjunctiva/Sclera White and quiet White and quiet   Cornea Clear Clear   Anterior Chamber Deep and quiet Deep and quiet   Iris Round and reactive Round and reactive   Lens Centered posterior chamber intraocular lens Centered posterior chamber intraocular lens   Anterior Vitreous Normal Normal         Fundus Exam       Right Left   Posterior Vitreous  Posterior vitreous detachment?, Vitreous debris, no Weiss ring the   Disc  Peripapillary atrophy   C/D Ratio  0.25   Macula  Retinal pigment epithelial mottling, no exudates, no hemorrhage, macular thickening, Early age related macular degeneration, Pigmented atrophy, nonturbid subretinal fluid   Vessels  Normal, , no DR   Periphery  Subretinal fluid, [place]            IMAGING AND PROCEDURES  Imaging and Procedures for 10/30/21  OCT, Retina - OU - Both Eyes       Right Eye Quality was good. Scan locations included subfoveal. Central Foveal Thickness: 245. Progression has improved. Findings include no IRF, no SRF, retinal drusen , vitreous traction, vitreomacular adhesion .   Left Eye Quality was good. Scan locations included  subfoveal. Central Foveal Thickness: 206. Progression has improved. Findings include no IRF, pigment epithelial detachment.   Notes Pleat resolution of subretinal subfoveal fluid today post Vabysmo, the nasal aspect to FAZ, repeat injection today with  Vabysmo   OD, incidental partial PVD with VMA to macula remains     Intravitreal Injection, Pharmacologic Agent - OS - Left Eye       Time Out 10/30/2021. 10:57 AM. Confirmed correct patient, procedure, site, and patient consented.   Anesthesia Topical anesthesia was used. Anesthetic medications included Lidocaine 4%.   Procedure Preparation included 5%  betadine to ocular surface, 10% betadine to eyelids, Tobramycin 0.3%. A 30 gauge needle was used.   Injection: 6 mg faricimab-svoa 6 MG/0.05ML   Route: Intravitreal, Site: Left Eye   NDC: [phone], Lot: [id number], Expiration date: 07/18/2023, Waste: 0 mL   Post-op Post injection exam found visual acuity of at least counting fingers. The patient tolerated the procedure well. There were no complications. The patient received written and verbal post procedure care education. Post injection medications included ocuflox.              ASSESSMENT/PLAN:  OSA on CPAP Continue and very well compliant  Exudative age-related macular degeneration of left eye with active choroidal neovascularization (HCC) Now improved subretinal fluid post change to Vabysmo OS.  Repeat injection today and maintain 1 month follow-up  Intermediate stage nonexudative age-related macular degeneration of both eyes Stable OD  Diabetes mellitus without complication ([place]) No detectable DR     ICD-10-CM   1. Exudative age-related macular degeneration of left eye with active choroidal neovascularization (HCC)  H35.3221 OCT, Retina - OU - Both Eyes    Intravitreal Injection, Pharmacologic Agent - OS - Left Eye    faricimab-svoa (VABYSMO) '6mg'$ /0.89m intravitreal injection    CANCELED: Intravitreal Injection, Pharmacologic Agent - OS - Left Eye    2. OSA on CPAP  G47.33    Z99.89     3. Intermediate stage nonexudative age-related macular degeneration of both eyes  H35.3132     4. Diabetes mellitus without complication (HCC)  EG25.6      1.  OS, subretinal fluid vastly improved now since change to Vabysmo treatment.  Currently at 5 weeks.  Follow-up injection today with Vabysmo and reevaluate again in 1 month  2.  Dilate OU next   no sign of CNVM OD  3.  Detectable DR  Ophthalmic Meds Ordered this visit:  Meds ordered this encounter  Medications   faricimab-svoa (VABYSMO) '6mg'$ /0.057mintravitreal  injection       Return in about 1 month (around 11/30/2021) for DILATE OU, VABYSMO OCT, OS.  There are no Patient Instructions on file for this visit.   Explained the diagnoses, plan, and follow up with the patient and they expressed understanding.  Patient expressed understanding of the importance of proper follow up care.   GaClent Demarkankin M.D. Diseases & Surgery of the Retina and Vitreous Retina & Diabetic EyAbbeville8/14/23     Abbreviations: M myopia (nearsighted); A astigmatism; H hyperopia (farsighted); P presbyopia; Mrx spectacle prescription;  CTL contact lenses; OD right eye; OS left eye; OU both eyes  XT exotropia; ET esotropia; PEK punctate epithelial keratitis; PEE punctate epithelial erosions; DES dry eye syndrome; MGD meibomian gland dysfunction; ATs artificial tears; PFAT's preservative free artificial tears; NSFremontuclear sclerotic cataract; PSC posterior subcapsular cataract; ERM epi-retinal membrane; PVD posterior vitreous detachment; RD retinal detachment; DM diabetes mellitus; DR diabetic retinopathy; NPDR non-proliferative diabetic  retinopathy; PDR proliferative diabetic retinopathy; CSME clinically significant macular edema; DME diabetic macular edema; dbh dot blot hemorrhages; CWS cotton wool spot; POAG primary open angle glaucoma; C/D cup-to-disc ratio; HVF humphrey visual field; GVF goldmann visual field; OCT optical coherence tomography; IOP intraocular pressure; BRVO Branch retinal vein occlusion; CRVO central retinal vein occlusion; CRAO central retinal artery occlusion; BRAO branch retinal artery occlusion; RT retinal tear; SB scleral buckle; PPV pars plana vitrectomy; VH Vitreous hemorrhage; PRP panretinal laser photocoagulation; IVK intravitreal kenalog; VMT vitreomacular traction; MH Macular hole;  NVD neovascularization of the disc; NVE neovascularization elsewhere; AREDS age related eye disease study; ARMD age related macular degeneration; POAG primary open angle  glaucoma; EBMD epithelial/anterior basement membrane dystrophy; ACIOL anterior chamber intraocular lens; IOL intraocular lens; PCIOL posterior chamber intraocular lens; Phaco/IOL phacoemulsification with intraocular lens placement; [place] photorefractive keratectomy; LASIK laser assisted in situ keratomileusis; HTN hypertension; DM diabetes mellitus; COPD chronic obstructive pulmonary disease

## 2021-11-13 ENCOUNTER — Telehealth: Payer: Self-pay | Admitting: Neurology

## 2021-11-13 NOTE — Telephone Encounter (Signed)
LVM and sent mychart msg asking pt to call back to reschedule 11/27 appointment - MD out

## 2021-12-04 ENCOUNTER — Encounter (INDEPENDENT_AMBULATORY_CARE_PROVIDER_SITE_OTHER): Payer: Medicare Other | Admitting: Ophthalmology

## 2021-12-11 ENCOUNTER — Encounter (INDEPENDENT_AMBULATORY_CARE_PROVIDER_SITE_OTHER): Payer: Self-pay

## 2021-12-11 ENCOUNTER — Encounter (INDEPENDENT_AMBULATORY_CARE_PROVIDER_SITE_OTHER): Payer: Medicare Other | Admitting: Ophthalmology

## 2021-12-18 ENCOUNTER — Encounter (INDEPENDENT_AMBULATORY_CARE_PROVIDER_SITE_OTHER): Payer: Medicare Other | Admitting: Ophthalmology

## 2022-01-01 ENCOUNTER — Encounter (INDEPENDENT_AMBULATORY_CARE_PROVIDER_SITE_OTHER): Payer: Medicare Other | Admitting: Ophthalmology

## 2022-02-12 ENCOUNTER — Ambulatory Visit: Payer: Medicare Other | Admitting: Neurology

## 2022-04-17 ENCOUNTER — Ambulatory Visit (INDEPENDENT_AMBULATORY_CARE_PROVIDER_SITE_OTHER): Payer: Medicare Other | Admitting: Neurology

## 2022-04-17 ENCOUNTER — Other Ambulatory Visit: Payer: Self-pay | Admitting: Neurology

## 2022-04-17 ENCOUNTER — Encounter: Payer: Self-pay | Admitting: Neurology

## 2022-04-17 VITALS — BP 160/84 | HR 84 | Ht 60.0 in | Wt 222.0 lb

## 2022-04-17 DIAGNOSIS — G4733 Obstructive sleep apnea (adult) (pediatric): Secondary | ICD-10-CM | POA: Diagnosis not present

## 2022-04-17 DIAGNOSIS — Z6841 Body Mass Index (BMI) 40.0 and over, adult: Secondary | ICD-10-CM

## 2022-04-17 DIAGNOSIS — I739 Peripheral vascular disease, unspecified: Secondary | ICD-10-CM | POA: Diagnosis not present

## 2022-04-17 DIAGNOSIS — E66813 Obesity, class 3: Secondary | ICD-10-CM

## 2022-04-17 DIAGNOSIS — C50112 Malignant neoplasm of central portion of left female breast: Secondary | ICD-10-CM

## 2022-04-17 DIAGNOSIS — Z170 Estrogen receptor positive status [ER+]: Secondary | ICD-10-CM

## 2022-04-17 DIAGNOSIS — Z9989 Dependence on other enabling machines and devices: Secondary | ICD-10-CM

## 2022-04-17 DIAGNOSIS — I10 Essential (primary) hypertension: Secondary | ICD-10-CM | POA: Diagnosis not present

## 2022-04-17 LAB — LAB REPORT - SCANNED
A1c: 8
EGFR: 71

## 2022-04-17 NOTE — Progress Notes (Signed)
PATIENT: [name] DOB: 21-Nov-1945  REASON FOR VISIT: follow up- obstructive sleep apnea on CPAP HISTORY FROM: patient-pt here for the initial cpap compliance for her new machine. DME [facility] - [facility]       INTERVAL HISTORY : I am meeting with [name] on 04/17/22 at 10:30 AM EST ,  Mrs. [name]. Jetter is seen here today and a prolonged Revisit she received a replacement for her [employer] Respironics machine after the manufacturer's recall to all devices in 2022 and her older ResMed machine was still working just fine for her which she also owned so she uses that machine at [employer]. She was given 3 years ago a new ResMed machine as well- while waiting for the replacement .   Her compliance between the  machines has always been 100%.     Looking back at the compliance report she is using a minimum pressure of 6 and a maximum pressure of 12 cmH2O with 2 cm expiratory pressure relief this is a ResMed machine and 100% compliance this year with an average of 8 hours and 20 minutes of nightly use.  The residual AHI is 1.5 which is excellent no central apneas are arising.  Her 95th percentile pressure is 11.7 cm water and she does have mild sometimes moderate air leakage.  I would see no need to change any of the settings and giving the clinical response she is doing very well as well.  OSA risk factors are unchanged , BMI 43.3 kg/m2.  Hypertension was present today.         02-08-2021 I have the pleasure of meeting with Mrs. [name] today, she reports that after she underwent her sleep study she received a replacement for her [employer] through the manufacturer recall.  And she now has a new [employer] still in the box that no one became in the mail.  She is using ice her ResMed machine S9 AutoSet which is working just fine for her.  She uses her very old machine which still works at [employer].  Her compliance has  always been 100%.  Compliance download shows on her air sense 11 ResMed machine a compliance of 70% the [place] machine supplements the other 30% so incomplete combination she has 100% compliance with an average of 8 hours and 30 minutes nightly use.  She uses a minimum pressure of 6 and a maximum pressure of 12 cmH2O was 2 cm EPR, her 95th percentile pressure is 11.8 cm water.  Residual AHI is 1.0 no central apneas are arising and there is a 95th percentile air leak of 23.5 L/min noted which does not bother the patient. Wisp nasal mask. We agreed to address her main risk factor, obesity today and she is going to ask her PCP about Ozempic or other Apetite- constraining medications.         02-04-2021-RV  this is her first visit with me after she received a new machine.  In May of this year on 07/22/2019 she underwent a baseline polysomnogram again and this showed that the patient still had apnea her AHI was 27.4 for the total night and her REM sleep AHI was 5075.9/h so this is a strong REM dependent sleep apnea form all night was spent in supine position so I cannot judge if another position would have increased or decreased her apnea count.  She spent 18 minutes total at below 89% oxygen saturation.  She had no periodic limb movements, her EKG was in normal sinus rhythm.  And she was asked to resume CPAP therapy with a new machine that she was doing for.  The machine was provided by [facility] in [place] after she had a bad experience with [facility].   Her compliance report speaks for 100% compliance by days and hours, with average use at time of 7 hours and 32 minutes nightly. Tthe machine is set for minimum pressure of 6 maximum pressure of 12 to centimeter EPR and an AHI of 1.3/h there are no central apneas arising and no evidence of Cheyne-Stokes respiration has been found.  The 97th percentile pressure is 10.7 cm so we could even increase her maximum pressure a little bit, air leaks  are mild to moderate.. She is using the wisp nasal mask and is very happy with it.   Chief Complaint: she states things are going well. She is a beast cancer survivor, she needs to take estrogen blockers for a total of 7 years- that will be October 2021.  She has a new phone system , blocking Robocalls, and may have trouble to get info from DME [facility].  Patient states most recent machine is >5 yrs old. she uses that one at home and its a Respironics- [employer]. We were  able to get download on her back up machine ( older CPAP ResMed [place] a res med.   Her newer machine is now also 78 years old and she needs a new one. She desperately wants to change DMEs. Last sleep study 09/2009 but she got a new dream-station philips machine without retesting in July 2016 , provided without prescription by [facility].   Interval history , left sided Wet Macular Degeneration and right sided Glaucoma,  Dr Midge Aver, Dr. Zadie Rhine. OSA certainly can influence the course of her eye disease.       07-08-2018 RV CD  We discussed the possible new issue of a new CPAP by 7 -2021, this time through [place].  I will follow q 12 month with compliance and Epworth score. The patient had 100% compliance for the last 30 days with an average user time of 8 hours and 18 minutes and 5 seconds.  Each day the machine was used over 4 hours.  The 95th percentile pressure was 10.6 cmH2O the average residual AHI was 4.3/h of sleep, the mean pressure was 8.5 cm of water this current machine has an a flex setting of 2 cm and minimum pressure of 6 and a maximum pressure of 12 cmH2O.  The ramp time is 15 minutes long.  The humidifier is set at 2 and the heated tube is set at 3.  There needs to be no change in the patient's settings however she would much rather use her oldest machine which [place] then using the current machine a dream station auto CPAP from July 2016.   I will take care of that the next machine will be a ResMed machine again if possible.  It would likely be another auto titration device but I want to be able to tell if she has central or obstructive apneas and to find more out why her AHI is above 3/h.  She does not have significant air leaks by the way.   05-09-2017, CD Mrs. Justin is a 77 year old caucasian, married female, here seen in the presence of her  husband. She is a breast cancer survivor. She uses nasal pillows. 2 years ago she got a new machine after her first machine broke. At the time she switched from [facility] to nasal.    I received and reviewed the patient CPAP download from her DME company. It appears the patient has used her CPAP nightly for the last 30 days. Each night she uses her machine greater than 4 hours. On average she uses her machine 8 hours and 38 minutes. Her average pressure is 9.6 cm of water. Her residual AHI is 3.9. This is an excellent control of apnea per compliance download. Epworth 4, FSS 28. She reports her hands get numb, and  when in bed her feet will get numb- she had back surgery and had symptoms before ( left sided sciatica- PAIN) .   Ms. Mooradian is a 77 year old female with a history of obstructive sleep apnea on CPAP. She returns today for a compliance download. Unfortunately we are unable to obtain a wireless download. We will have her DME company fax [place] over a 30 day download. The patient states that she continues to use her CPAP nightly. Denies any trouble falling asleep. She continues to feel that the machine works well. She does note that sometimes if she sleeps on her side she will notice that the mask leaks slightly. She does change out her supplies regularly. She reports that she lost her eldest son on February 5. This was unexpected. She denies any new neurological symptoms. Returns today for an evaluation.  HISTORY  11/02/15 [facility]): Ms. Bjorklund is a 77 year old female with a history of obstructive sleep  apnea on CPAP. She returns today for a 30 day compliance download.    Her download today on 11/02/2015 documents an 87% compliance with an average user time of 6 hours and 37 minutes on all days. The patient's residual AHI has increased to 4.4 which is still good but used to be a little bit lower area to she uses an auto CPAP with a 90's percent pressure of 9.6 cm water the window of pressures between 6 and 12 cm water. She has a very long ramp time of 30 minutes. She has noticed that she has a little bit more trouble to initiate sleep on her new machine and I think this may be related to the very long ramp time. I will shorten the REM time to 12 minutes.   HISTORY 10/27/13:   Ms. Estrella is 77 year old female with history of obstructive sleep Apnea on CPAP. She returns today for a 90 day compliance download. She brought her machine with her today and her reports shows an AHI of 2.2 on auto-titration at 6-12 cm of water. She uses her machine for  an average of 6 hours and 22 minutes a night, with 97%compliance. His Epworth score is 4 points was previously 3 . His fatigue severity score is 8 was previously 16.  Patient reports that she gets about 6.5  hours of sleep a night. She goes to bed around 11:30 pm and arises between 6:30-7:30am. She denies having trouble falling asleep or staying a sleep. States that she normally does not have to get up at night to urinate. Overall patient feels that CPAP has improved his sleepiness and fatigue. Since the last visit the Patient was diagnosed with breast cancer in June 2014. She is on an estrogen blocker now which has caused her to gain weight. She states she is now cancer free.  Social  history : widowed, 03-08-2020,husband phil died within 36 months of the golden anniversary- Abbe Amsterdam was her driver, her mate, she has ambulatory difficulties, 4 prong cane and is somewhat afraid of driving.  Her  middle son was stationed in [place] and she used to travel with [employer]. Her  eldest son died in Jun 09, 2016, he was a Therapist, nutritional and sang. Youngest and middle son and daughter live in [place]. Marland Kitchen    REVIEW OF SYSTEMS: Out of a complete 14 system review of symptoms, the patient complains only of the following symptoms, and all other reviewed systems are negative.  Weight gain- she has been regaining weight after Phil's death 2020/03/18 due to [place]-  on their son's [birth year] 09[birth year] 2040 birthday - still has some  grief reaction.    How likely are you to doze in the following situations: 0 = not likely, 1 = slight chance, 2 = moderate chance, 3 = high chance  Sitting and Reading? Watching Television? Sitting inactive in a public place (theater or meeting)? Lying down in the afternoon when circumstances permit? Sitting and talking to someone? Sitting quietly after lunch without alcohol?  1 In a car, while stopped for a few minutes in traffic? As a passenger in a car for an hour without a break?  1  Total = 3/24  " I can't sleep without CPAP ! I sleep well. "   She endorsed today the fatigue severity score at 13 points which is low and the Epworth sleepiness score at only 3 out of 24 points.  Her geriatric depression score was endorsed at 4 out of 15 points which is not indicative of depression to be present.    ALLERGIES: Allergies  Allergen Reactions   Peanut (Diagnostic) Cough, Hypertension and Shortness Of Breath   Adhesive [Tape] Rash   Atorvastatin Other (See Comments)    Myalgia   Crestor [Rosuvastatin] Other (See Comments)    Severe arthralgia and myalgia   Latex Itching and Other (See Comments)    Causes blisters   Lovastatin Other (See Comments)    Arthralgias, hyperglycemia with lovastatin.    HOME MEDICATIONS: Outpatient Medications Prior to Visit  Medication Sig Dispense Refill   albuterol (VENTOLIN HFA) 108 (90 Base) MCG/ACT inhaler Inhale 1 puff into the lungs every 6 (six) hours as needed for wheezing or shortness of breath.     Ascorbic Acid (VITAMIN C) 1000 MG  tablet Take 1,000 mg by mouth daily.     aspirin 81 MG tablet Take 1 tablet (81 mg total) by mouth daily. 30 tablet    Brimonidine Tartrate-Timolol (COMBIGAN OP) Place 1 drop into both eyes 2 (two) times daily.     Cholecalciferol (VITAMIN D) 50 MCG (2000 UT) CAPS Take 2,000 Units by mouth daily.     fluticasone (FLONASE) 50 MCG/ACT nasal spray Place 1 spray into both nostrils daily. (Patient taking differently: Place 1 spray into both nostrils every other day.) 16 g 5   glucose blood test strip Use as instructed 100 each 4   lisinopril (ZESTRIL) 10 MG tablet Take 10 mg by mouth daily.     loratadine (CLARITIN) 10 MG tablet Take 1 tablet (10 mg total) by mouth daily. (Patient taking differently: Take 10 mg by mouth every other day.)     metFORMIN (GLUCOPHAGE) 500 MG tablet Take 1 tablet (500 mg total) by mouth daily with breakfast. (Patient taking differently: Take 500 mg by mouth 2 (two) times daily with a meal.) 90 tablet 1  metoprolol succinate (TOPROL-XL) 25 MG 24 hr tablet Take 1 tablet by mouth once daily 90 tablet 0   OVER THE COUNTER MEDICATION Take 1 tablet by mouth 2 (two) times daily. advanced eye health complex     Polyethyl Glycol-Propyl Glycol (SYSTANE OP) Place 1 drop into both eyes daily as needed (Dry eye).     simvastatin (ZOCOR) 20 MG tablet TAKE 1 TABLET BY MOUTH AT BEDTIME 90 tablet 0   Turmeric Curcumin 500 MG CAPS Take 500 mg by mouth daily.     Zinc 50 MG TABS Take 50 mg by mouth daily.     HYDROcodone-acetaminophen (NORCO/VICODIN) 5-325 MG tablet Take 1 tablet by mouth every 4 (four) hours as needed for moderate pain ((score 4 to 6)). 30 tablet 0   methocarbamol (ROBAXIN) 500 MG tablet Take 1 tablet (500 mg total) by mouth every 6 (six) hours as needed for muscle spasms. 90 tablet 1   Facility-Administered Medications Prior to Visit  Medication Dose Route Frequency Provider Last Rate Last Admin   EPINEPHrine (Anaphylaxis) SOLN 0.3 mg  0.3 mg Intramuscular Once Bobbitt,  Sedalia Muta, MD        PAST MEDICAL HISTORY: Past Medical History:  Diagnosis Date   Allergic rhinitis 03/23/2013   Allergy    Anemia    due to vaginal bleeding   Arthritis    hands   Asthmatic bronchitis 09/03/2017   Breast cancer ([place]) 08/2012   left   Dental crowns present    Diabetes mellitus without complication ([place])    Eczema    Fibroids    Glaucoma    Headache(784.0)    tension   History of endometriosis    Hordeolum externum right upper eyelid 07/25/2020   New finding OD today not noticed by the patient prior   Hyperlipidemia    Hypertension    under control with med., has been on med. x 3 yr.   Nuclear sclerotic cataract of right eye 12/02/2019   OSA (obstructive sleep apnea)    Paroxysmal SVT (supraventricular tachycardia)    Postmenopausal bleeding    Recurrent upper respiratory infection (URI)    S/P radiation therapy 10/20/2012-12/05/2012   left breast 50.4 gray, lumpectomy cavity boosted to 62.4 gray   Seasonal allergies    Sleep apnea    uses CPAP nightly   Spinal stenosis    Urinary urgency    White coat hypertension     PAST SURGICAL HISTORY: Past Surgical History:  Procedure Laterality Date   BACK SURGERY  1997   lumbar   BREAST LUMPECTOMY WITH NEEDLE LOCALIZATION AND AXILLARY SENTINEL LYMPH NODE BX Left 09/03/2012   Procedure: BREAST LUMPECTOMY WITH NEEDLE LOCALIZATION AND AXILLARY SENTINEL LYMPH NODE BX;  Surgeon: Edward Jolly, MD;  Location: [facility];  Service: General;  Laterality: Left;   CHOLECYSTECTOMY  1995   COLONOSCOPY W/ POLYPECTOMY  03/20/2007   DILATION AND CURETTAGE OF UTERUS  2005   DILATION AND CURETTAGE OF UTERUS  06/22/2011   Procedure: DILATATION AND CURETTAGE;  Surgeon: Alvino Chapel, MD;  Location: [facility];  Service: Gynecology;  Laterality: N/A;  OK PER KEELA FOR 7:15 START   DORSAL COMPARTMENT RELEASE Right 05/19/2013   Procedure: RELEASE 1ST  DORSAL COMPARTMENT RIGHT  (DEQUERVAIN);  Surgeon: Cammie Sickle., MD;  Location: [facility];  Service: Orthopedics;  Laterality: Right;   EXPLORATORY LAPAROTOMY  06/30/2007   ATTEMPTED HYSTERECTOMY ABORTED DUE TO EXTENSIVE ENDOMETRIOSIS W/ DENSE PELVIC  ADHESIONS INVOLVING UTERUS AND LOWER RECTOSIGMOID   EYE SURGERY Bilateral 2022   cataracts   HYSTEROSCOPY WITH D & C  05/06/2009   TRANSFORAMINAL LUMBAR INTERBODY FUSION W/ MIS 1 LEVEL Right 06/08/2021   Procedure: Mininmally Invasive , Decompression Transforaminal Lumbar Interbody Fusion Lumbar four-five;  Surgeon: Dawley, Theodoro Doing, DO;  Location: [facility];  Service: Neurosurgery;  Laterality: Right;    FAMILY HISTORY: Family History  Problem Relation Age of Onset   Vision loss Mother    Hypertension Mother    Anesthesia problems Mother        post-op N/V   Heart disease Mother        carotid stenosis s/p CAE   Osteoporosis Mother    Eczema Mother    Depression Father    Heart disease Sister 15       arrhythmia   Hypertension Sister    Hyperlipidemia Sister    Diabetes Brother    Hypertension Brother    Cancer Brother        skin   Hyperlipidemia Brother    Stroke Brother    Kidney failure Maternal Grandmother    Heart disease Maternal Grandfather    COPD Daughter    Atopy Daughter    Cancer Cousin    Hypertension Other    Colon cancer Neg Hx    Allergic rhinitis Neg Hx    Asthma Neg Hx    Urticaria Neg Hx     SOCIAL HISTORY: Social History   Socioeconomic History   Marital status: Widowed    Spouse name: [name]   Number of children: 4   Years of education: College   Highest education level: Not on file  Occupational History   Occupation: retired  Tobacco Use   Smoking status: Never   Smokeless tobacco: Never  Vaping Use   Vaping Use: Never used  Substance and Sexual Activity   Alcohol use: No    Alcohol/week: 0.0 standard drinks of alcohol   Drug use: No   Sexual activity: Yes    Comment: menarche 81, P4,  Premarin for short while, Provera x 3 years  Other Topics Concern   Not on file  Social History Narrative   Marital status: married x 69  years; happily married; no abuse.      Children: 4 children (2 sons living; 1 son died in 2519 at age 65years old, 1 daughter); six grandchildren; no gg.      Lives:  With husband.      Employed: retired in 2011; [employer].      Tobacco: none       Alcohol:  None      Drugs: none      Exercise:  Rowing 3 days per week.      Seatbelt:  100%      Sunscreen:  Face SPF 15.      Guns:  Loaded mostly secured.      ADLs:  Drives minimally; no assistant devices; independent with all ADLs.       Advanced Directives: YES; FULL CODE but no prolonged measures.      Her nocturia is improved under CPAP use at 10 cm water- AHI is now 0.8 and from 30 at baseline. CMS compliance  6 hours 40 minutes - sleeps on the side, 08-10-11 .FFM - likes it.    . Flonase used prn.     Caffeine Use: 1 cup daily   Social Determinants of Health   Financial  Resource Strain: Not on file  Food Insecurity: Not on file  Transportation Needs: Not on file  Physical Activity: Not on file  Stress: Not on file  Social Connections: Not on file  Intimate Partner Violence: Not on file   Peanut allergy- latex allergy.    Vitals:   04/17/22 1017 04/17/22 1023  BP: (!) 155/77 (!) 160/84  Pulse: 85 84  Weight: 222 lb (100.7 kg)   Height: 5' (1.524 m)    Body mass index is 43.36 kg/m.  Left breast mastectomy.  Lymphedema, in left arm.  Ankle edema noted.  No Lymphedema on the left arm ! Neck Circumference : 17.5 inches, Mallampati 2-  Slightly hoarse.  Generalized: Well developed, in no acute distress, well groomed.   Neurological examination  Mentation: Alert oriented to time, place, history taking.  Follows all commands speech and language fluent Cranial nerve:  Taste and smell have recovered- impaired by chemotherapy. . Pupils are equally round and  responsive.   Uvula and tongue move in midline.  Head turning and shoulder shrug  were normal and symmetric.  Motor: restricted  ROM in all proximal 4 extremities. Pain, myalgia -  Symmetric grip, but no tremors, . Coordination:  deferred Gait and station: Gait is wide based. This is obesity related.  Balance problems when getting up in AM.   DIAGNOSTIC DATA (LABS, IMAGING, TESTING) - I reviewed patient records, labs, notes, testing and imaging myself where available.   Lab Results  Component Value Date   CHOL 228 (H) 04/14/2020   HDL 42 04/14/2020   LDLCALC 157 (H) 04/14/2020   TRIG 157 (H) 04/14/2020   CHOLHDL 5.4 (H) 04/14/2020   Lab Results  Component Value Date   HGBA1C 7.0 (H) 04/14/2020    Lab Results  Component Value Date   TSH 2.010 04/14/2020     I discussed the assessment and treatment plan with the patient. The patient was provided an opportunity to ask questions and all were answered. The patient agreed with the plan and demonstrated an understanding of the instructions.   Assessment and Plan :    1. Obstructive sleep apnea on CPAP. Her old machine was a [employer] and had been replaced by a [employer]. The patients old machine was replaced after we underwent sleep test in 2021, confirmed the need for CPAP treatment and ordered a new machine, than the Respironics device was recalled and not replaced.   Immediately and I ordered a RESMD machine , she is currently using this ResMed device 78 years old. She is using it compliantly and she is happy with the therapy.  She would like to investigate changes in DME - and likes to switch to [place] or adapt.   2. OSA -Main risk factor - obesity.    3 Left Breast cancer/ survivor, status post radiation. Cold sensitive , gaining weight. Lymphedema in lef upper extremity.   DME is at this time still  [facility] in [place].  RV in 12 month   provided 28 minutes of face-to-face time during this encounter. Patient will  be seen by NP in 12 oths, and needs anew baseline in 2026.   Larey Seat, MD  Larey Seat, MD  04/17/2022, 11:05 AM [facility] [address] [place], [place] [address] [phone]

## 2022-04-17 NOTE — Patient Instructions (Signed)

## 2022-05-16 ENCOUNTER — Telehealth: Payer: Self-pay

## 2022-05-16 ENCOUNTER — Encounter: Payer: Self-pay | Admitting: Neurology

## 2022-05-16 NOTE — Telephone Encounter (Signed)
Pt LVM about supplies, please return call to home phone - [phone].

## 2022-05-16 NOTE — Telephone Encounter (Signed)
Already spoke to the pt via mychart. Orders have been sent

## 2022-05-27 IMAGING — MR MR LUMBAR SPINE W/O CM
4 of 5 series · 18 of 48 positions shown · non-contrast
Comparison: 12/03/2014

CLINICAL DATA: Low back pain for years.

EXAM:
MRI LUMBAR SPINE WITHOUT CONTRAST
TECHNIQUE: Multiplanar, multisequence MR imaging of the lumbar spine was
performed. No intravenous contrast was administered.

[Series 6: T2 · sagittal · 4.0mm · 0.73mm/px · 6 of 15 slices shown (1 of 2)]
[im 1/15]
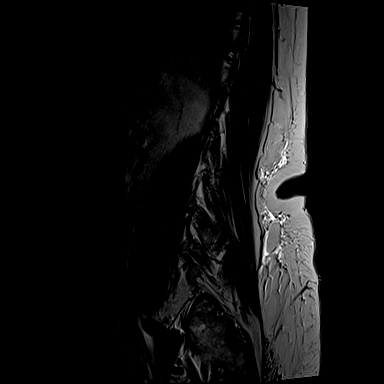
[im 3/15]
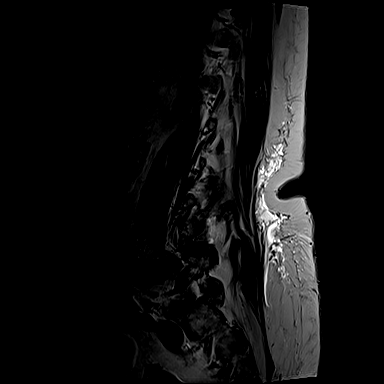
[im 6/15]
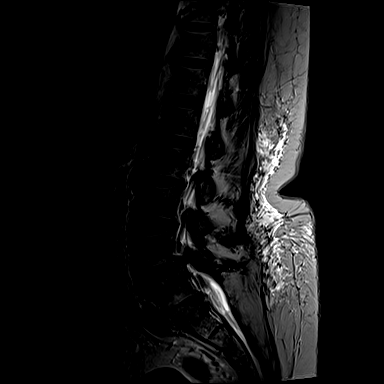
[im 9/15]
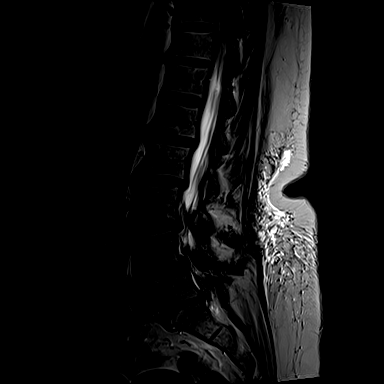
[im 12/15]
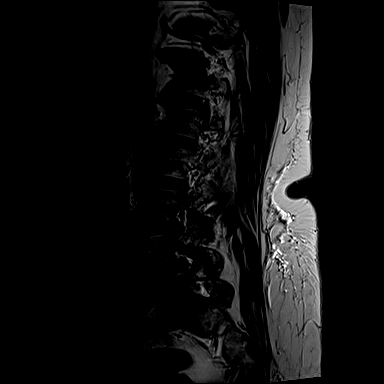
[im 15/15]
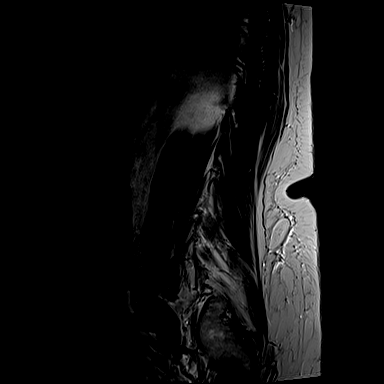

[Series 7: T1 · sagittal · 4.0mm · 0.73mm/px · 3 of 15 slices shown (1 of 2)]
[im 3/15]
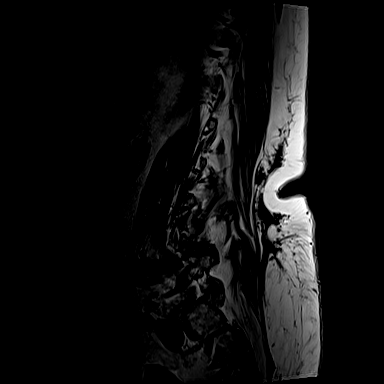
[im 9/15]
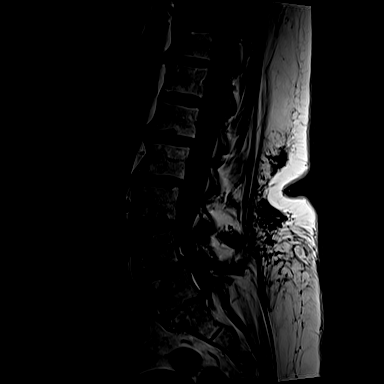
[im 15/15]
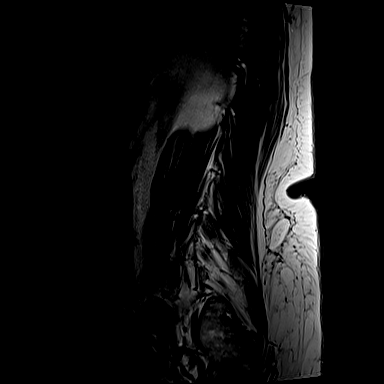

[Series 13: T2 · axial · 4.0mm · 0.28mm/px · z∈[-68,+101]mm · 6 of 38 slices shown (2 of 2)]
[im 1/38]
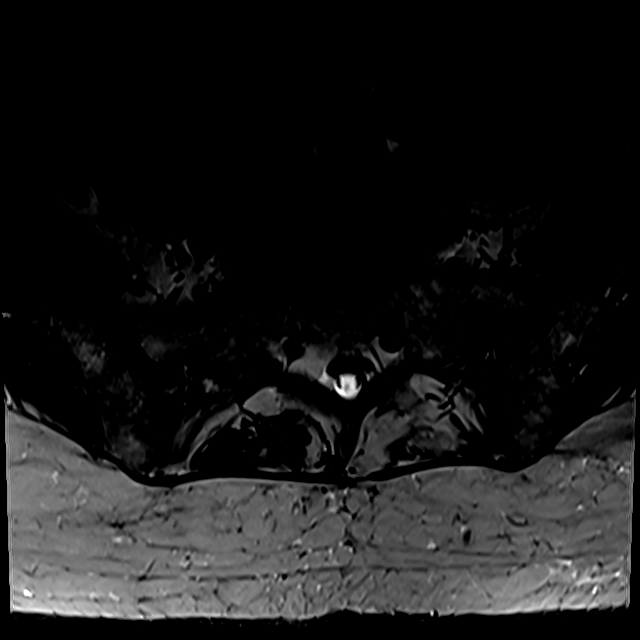
[im 6/38]
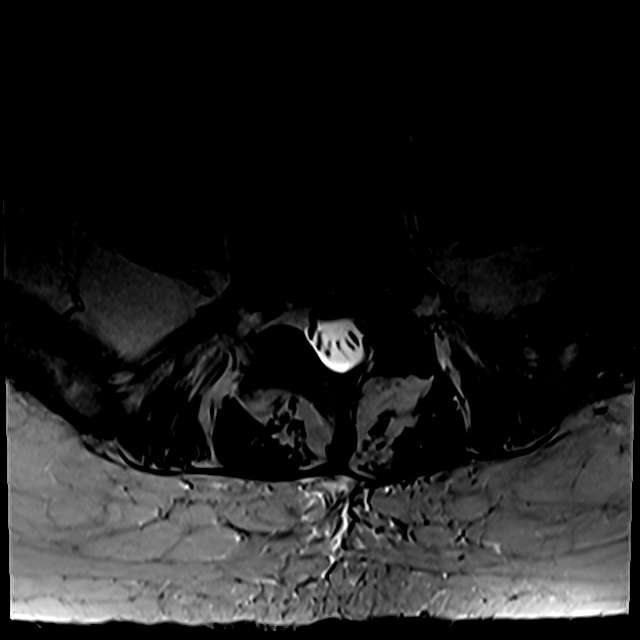
[im 11/38]
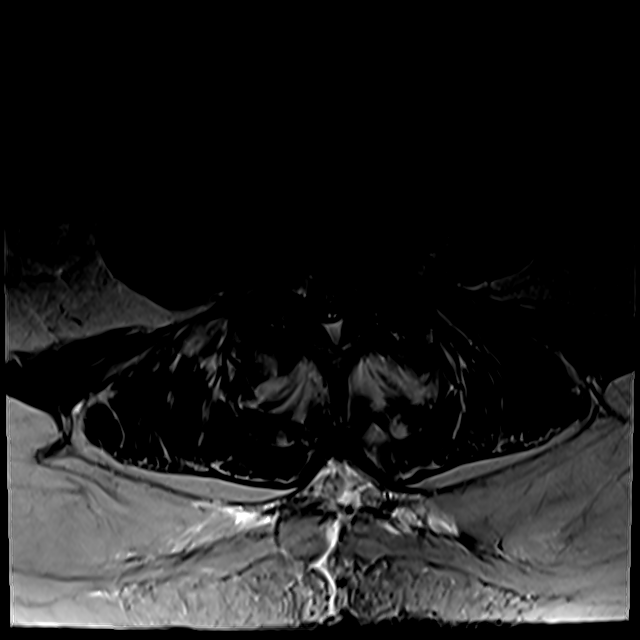
[im 16/38]
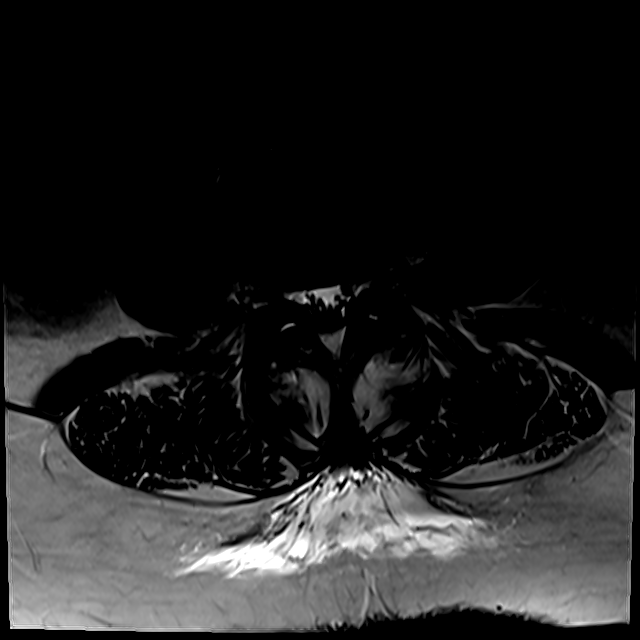
[im 19/38]
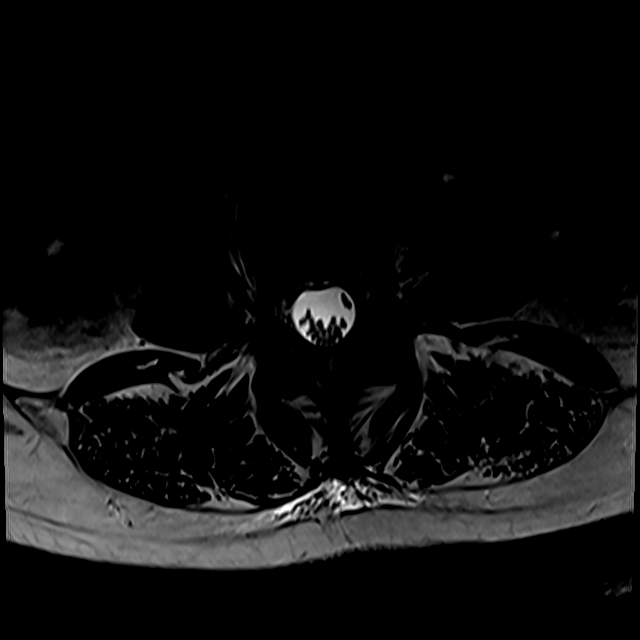
[im 32/38]
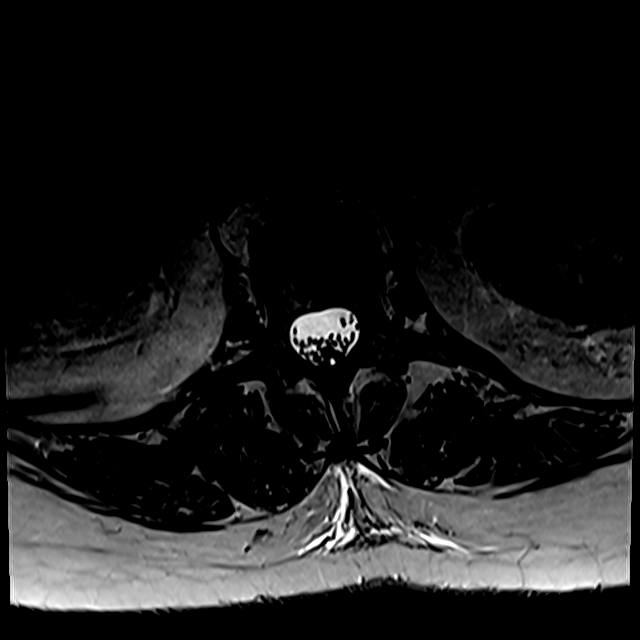

[Series 100: T1 · axial · 4.0mm · 0.28mm/px · z∈[-43,+101]mm · 3 of 38 slices shown (2 of 2)]
[im 6/38]
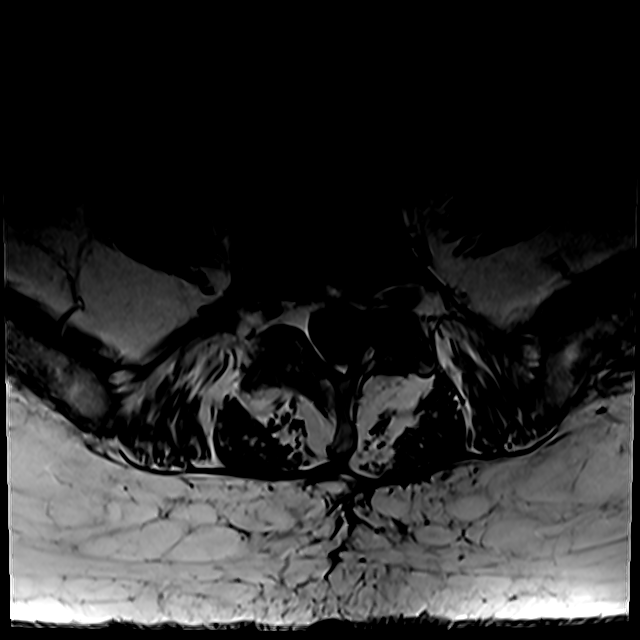
[im 19/38]
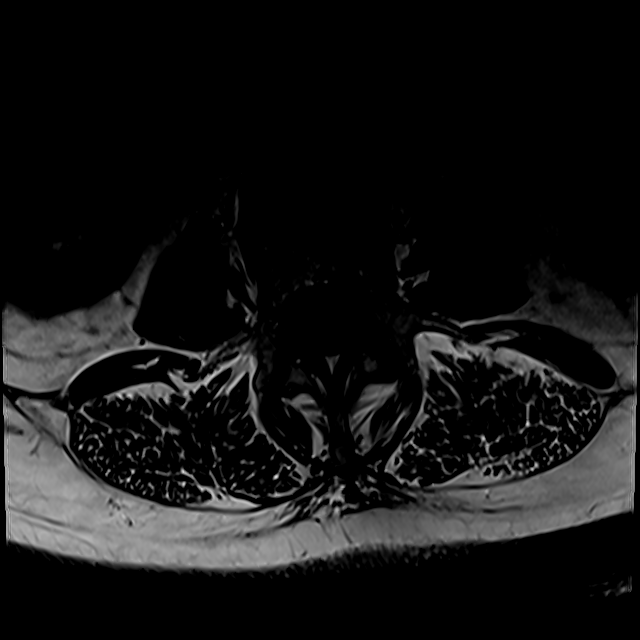
[im 32/38]
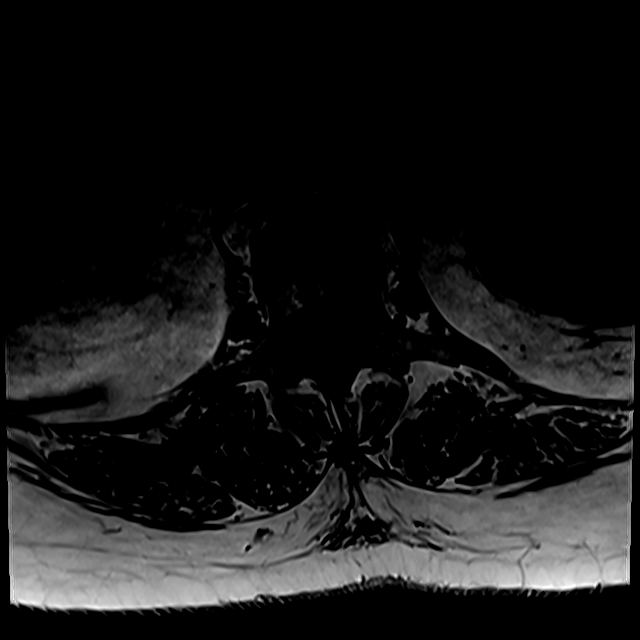

[18 of 48 positions shown; findings below may reference images not displayed]

FINDINGS: Segmentation:  Standard.

Alignment: Grade 1 anterolisthesis of L4 on L5 secondary to facet
disease. 2 mm retrolisthesis of L3 on L4.

Vertebrae: No acute fracture, evidence of discitis, or aggressive
bone lesion.

Conus medullaris and cauda equina: Conus extends to the T12-L1
level. Conus and cauda equina appear normal.

Paraspinal and other soft tissues: No acute paraspinal abnormality.

Disc levels:

Disc spaces: Degenerative disease with disc height loss at L1-2,
L2-3, L4-5 and L5-S1. Disc desiccation at L3-4.

T12-L1: No significant disc bulge. No neural foraminal stenosis. No
central canal stenosis.

L1-L2: Mild broad-based disc bulge. Mild bilateral facet
arthropathy. No foraminal or central canal stenosis.

L2-L3: Mild broad-based disc bulge. Mild bilateral facet arthropathy
with bilateral facet effusions. No foraminal or central canal
stenosis.

L3-L4: Mild broad-based disc bulge. Moderate bilateral facet
arthropathy. Bilateral subarticular recess stenosis. Mild right and
moderate left foraminal stenosis. No central canal stenosis.

L4-L5: Broad-based disc bulge. Severe bilateral facet arthropathy
with ligamentum flavum infolding. Severe spinal stenosis. Bilateral
subarticular recess stenosis. Moderate bilateral foraminal stenosis.

L5-S1: Partial acquired fusion across the L5-S1 disc space. No
foraminal or central stenosis. Mild bilateral facet arthropathy.
IMPRESSION: 1. Lumbar spine spondylosis as described above, most severe at L4-5.
2. No acute osseous injury of the lumbar spine.

## 2022-07-09 ENCOUNTER — Other Ambulatory Visit: Payer: Self-pay | Admitting: Cardiology

## 2022-07-26 LAB — LAB REPORT - SCANNED
A1c: 6.9
EGFR: 77

## 2023-03-08 LAB — LAB REPORT - SCANNED
A1c: 8.3
Albumin, Urine POC: 98.8
Creatinine, POC: 84.6 mg/dL
EGFR: 75
Microalb Creat Ratio: 117

## 2023-04-24 ENCOUNTER — Encounter: Payer: Self-pay | Admitting: Skilled Nursing Facility1

## 2023-04-24 ENCOUNTER — Encounter: Payer: Medicare Other | Attending: Nurse Practitioner | Admitting: Skilled Nursing Facility1

## 2023-04-24 DIAGNOSIS — E119 Type 2 diabetes mellitus without complications: Secondary | ICD-10-CM | POA: Insufficient documentation

## 2023-04-24 NOTE — Progress Notes (Signed)
 Medical Nutrition Therapy  Appointment Start time:  1:55pm  Appointment End time:  3:15pm  Primary concerns today: Diabetes self-management  Referral diagnosis: E11 Preferred learning style: hands on (auditory, visual, hands on, no preference indicated) Learning readiness: contemplating  (not ready, contemplating, ready, change in progress)   NUTRITION ASSESSMENT    Clinical Medical Hx: DM  Medications: see list Labs: A1C 8.3 Notable Signs/Symptoms: none   Lifestyle & Dietary Hx Pt states she wants a Refresher course on how to manage her diabetes  Pt states she is Retired  Pt states she is having troubles getting test strips to test blood sugar  Pt states she usually wakes up around 4:30am and 7/8amto use the bathroom because she is on a diruetic   Pt states she usually eats around at 10am , 2pm , and 4pm Pt states she goes to church and church events occasionally Pt states she doesn't like to eat out  Pt states she is struggling with snacking on potato chips   Estimated daily fluid intake: about 3 16 oz water bottles  Supplements: unknown Sleep: sleep well but wakes up to use the bathroom twice Stress / self-care:  Current average weekly physical activity:   24-Hr Dietary Recall First Meal: usually scrambled eggs mushroom , peppers , onion in keto tortillas  Tomato gravy  roux + water + tomatoes - with biscuits + eggs and half a piece bacon black -coffee half a cup  Snack: Second Meal: Salad + water  Snack:  Third Meal:  Sandwich- meat lettuce tomato mayo White bread , Hot chocolate an soup Snack: potato chips sometimes celery , cheese sticks ,  Beverages: water and coffee    NUTRITION DIAGNOSIS  NI-1.5 Excessive energy intake As related to Failure to adjust for lifestyle changes.  As evidenced by pt report of emotional eating and lack of social engagement  .   NUTRITION INTERVENTION  Nutrition education (E-1) on the following topics:  Assisted patient in  identifying root causes of emotional eating  Educated patient on healthy eating    Handouts Provided Include  Balance plate and food list  Chair and Arm exercise handout   Learning Style & Readiness for Change Teaching method utilized: Visual & Auditory  Demonstrated degree of understanding via: Teach Back  Barriers to learning/adherence to lifestyle change: Driving , mobility   Goals Established by Pt Keep looking for cleaning projects in your home  Pursue opportunities to get out of the house Consistently do exercises to stay active    MONITORING & EVALUATION Dietary intake  Next Steps  Patient does not desire follow up at this time; pt told by Dietitian to call in to the office for follow-up as needed

## 2023-04-24 NOTE — Progress Notes (Signed)
 [name]

## 2023-04-25 ENCOUNTER — Ambulatory Visit (INDEPENDENT_AMBULATORY_CARE_PROVIDER_SITE_OTHER): Payer: Medicare Other | Admitting: Neurology

## 2023-04-25 ENCOUNTER — Encounter: Payer: Self-pay | Admitting: Neurology

## 2023-04-25 VITALS — BP 139/70 | HR 101 | Ht 60.0 in | Wt 214.0 lb

## 2023-04-25 DIAGNOSIS — E66813 Obesity, class 3: Secondary | ICD-10-CM | POA: Diagnosis not present

## 2023-04-25 DIAGNOSIS — H35722 Serous detachment of retinal pigment epithelium, left eye: Secondary | ICD-10-CM | POA: Diagnosis not present

## 2023-04-25 DIAGNOSIS — I739 Peripheral vascular disease, unspecified: Secondary | ICD-10-CM

## 2023-04-25 DIAGNOSIS — Z9989 Dependence on other enabling machines and devices: Secondary | ICD-10-CM

## 2023-04-25 DIAGNOSIS — Z6841 Body Mass Index (BMI) 40.0 and over, adult: Secondary | ICD-10-CM

## 2023-04-25 DIAGNOSIS — H409 Unspecified glaucoma: Secondary | ICD-10-CM

## 2023-04-25 DIAGNOSIS — H353221 Exudative age-related macular degeneration, left eye, with active choroidal neovascularization: Secondary | ICD-10-CM

## 2023-04-25 NOTE — Progress Notes (Signed)
 Provider:  Dedra Gores, MD  Primary Care Physician:  Barbra Odor, NP [address] [place] [address]     Referring Provider: Barbra Odor, Np [address] [place],  [place] [address]          Chief Complaint according to patient   Patient presents with:          [employer]       HISTORY OF PRESENT ILLNESS:  [name] is a 78 y.o. female patient who is here for revisit 04/25/2023 for CPAP compliance.  Chief concern according to patient :  Interval history worsening vision impairment, glaucoma and macular degeneration. She still get injections for wet macular degeneration.  She feels some e air blowing and this may irriatate her eyes more than I the past.      I am meeting with [name] Barter on 04/17/22 at 10:30 AM EST ,[name]. Jenning is seen here today and a prolonged Revisit she received a replacement for her [employer] Respironics machine after the manufacturer's recall to all devices in 2022 and her older ResMed machine was still working just fine for her which she also owned so she uses that machine at [employer]. She was given 3 years ago a new ResMed machine as well- while waiting for the replacement .   Her compliance between the  machines has always been 100%.  Looking back at the compliance report she is using a minimum pressure of 6 and a maximum pressure of 12 cmH2O with 2 cm expiratory pressure relief this is a ResMed machine and 100% compliance this year with an average of 8 hours and 20 minutes of nightly use.  The residual AHI is 1.5 which is excellent no central apneas are arising.  Her 95th percentile pressure is 11.7 cm water and she does have mild sometimes moderate air leakage.  I would see no need to change any of the settings and giving the clinical response she is doing very well as well.   OSA risk factors are unchanged , BMI 43.3 kg/m2.  Hypertension was present today.   Soc history ; widowed 3 years , [name]  passed away.  She just became a great grand mother - her grand daughter  ( a former [employer] ) had a baby boy     Review of Systems: Out of a complete 14 system review, the patient complains of only the following symptoms, and all other reviewed systems are negative.:  Fatigue, sleepiness , snoring, f CPAP dependence.  How likely are you to doze in the following situations: 0 = not likely, 1 = slight chance, 2 = moderate chance, 3 = high chance   Sitting and Reading? Watching Television? Sitting inactive in a public place (theater or meeting)? As a passenger in a car for an hour without a break? Lying down in the afternoon when circumstances permit? Sitting and talking to someone? Sitting quietly after lunch without alcohol ? In a car, while stopped for a few minutes in traffic?   Total = 1/ 24 points   FSS endorsed at 11/ 63 points.   Social History   Socioeconomic History   Marital status: Widowed    Spouse name: [name]   Number of children: 4   Years of education: College   Highest education level: Not on file  Occupational History   Occupation: retired  Tobacco Use   Smoking status: Never  Smokeless tobacco: Never  Vaping Use   Vaping status: Never Used  Substance and Sexual Activity   Alcohol  use: No    Alcohol /week: 0.0 standard drinks of alcohol    Drug use: No   Sexual activity: Yes    Comment: menarche 13, P4, Premarin for short while, Provera x 3 years  Other Topics Concern   Not on file  Social History Narrative   Marital status: married x 52  years; happily married; no abuse.      Children: 4 children (2 sons living; 1 son died in 03-01-2017 at age 30years old, 1 daughter); six grandchildren; no gg.      Lives:  With husband.      Employed: retired in 03/01/10; [employer].      Tobacco: none       Alcohol :  None      Drugs: none      Exercise:  Rowing 3 days per week.      Seatbelt:  100%      Sunscreen:  Face SPF 15.      Guns:  Loaded  mostly secured.      ADLs:  Drives minimally; no assistant devices; independent with all ADLs.       Advanced Directives: YES; FULL CODE but no prolonged measures.      Her nocturia is improved under CPAP use at 10 cm water- AHI is now 0.8 and from 30 at baseline. CMS compliance  6 hours 40 minutes - sleeps on the side, 08-10-11 .FFM - likes it.    . Flonase  used prn.     Caffeine Use: 1 cup daily   Social Drivers of Corporate Investment Banker Strain: Not on file  Food Insecurity: Not on file  Transportation Needs: Not on file  Physical Activity: Not on file  Stress: Not on file  Social Connections: Not on file    Family History  Problem Relation Age of Onset   Vision loss Mother    Hypertension Mother    Anesthesia problems Mother        post-op N/V   Heart disease Mother        carotid stenosis s/p CAE   Osteoporosis Mother    Eczema Mother    Depression Father    Heart disease Sister 51       arrhythmia   Hypertension Sister    Hyperlipidemia Sister    Diabetes Brother    Hypertension Brother    Cancer Brother        skin   Hyperlipidemia Brother    Stroke Brother    Kidney failure Maternal Grandmother    Heart disease Maternal Grandfather    COPD Daughter    Atopy Daughter    Cancer Cousin    Hypertension Other    Colon cancer Neg Hx    Allergic rhinitis Neg Hx    Asthma Neg Hx    Urticaria Neg Hx     Past Medical History:  Diagnosis Date   Allergic rhinitis 03/23/2013   Allergy    Anemia    due to vaginal bleeding   Arthritis    hands   Asthmatic bronchitis 09/03/2017   Breast cancer (HCC) 08/2012   left   Dental crowns present    Diabetes mellitus without complication (HCC)    Eczema    Fibroids    Glaucoma    Headache(784.0)    tension   History of endometriosis    Hordeolum externum  right upper eyelid 07/25/2020   New finding OD today not noticed by the patient prior   Hyperlipidemia    Hypertension    under control with med., has been  on med. x 3 yr.   Nuclear sclerotic cataract of right eye 12/02/2019   OSA (obstructive sleep apnea)    Paroxysmal SVT (supraventricular tachycardia) (HCC)    Postmenopausal bleeding    Recurrent upper respiratory infection (URI)    S/P radiation therapy 10/20/2012-12/05/2012   left breast 50.4 gray, lumpectomy cavity boosted to 62.4 gray   Seasonal allergies    Sleep apnea    uses CPAP nightly   Spinal stenosis    Urinary urgency    White coat hypertension     Past Surgical History:  Procedure Laterality Date   BACK SURGERY  1997   lumbar   BREAST LUMPECTOMY WITH NEEDLE LOCALIZATION AND AXILLARY SENTINEL LYMPH NODE BX Left 09/03/2012   Procedure: BREAST LUMPECTOMY WITH NEEDLE LOCALIZATION AND AXILLARY SENTINEL LYMPH NODE BX;  Surgeon: Morene ONEIDA Olives, MD;  Location: [facility];  Service: General;  Laterality: Left;   CHOLECYSTECTOMY  1995   COLONOSCOPY W/ POLYPECTOMY  03/20/2007   DILATION AND CURETTAGE OF UTERUS  2005   DILATION AND CURETTAGE OF UTERUS  06/22/2011   Procedure: DILATATION AND CURETTAGE;  Surgeon: Toribio LITTIE Percy, MD;  [facility];  Service: Gynecology;  Laterality: N/A;  OK PER KEELA FOR 7:15 START   DORSAL COMPARTMENT RELEASE Right 05/19/2013   Procedure: RELEASE 1ST  DORSAL COMPARTMENT RIGHT (DEQUERVAIN);  Surgeon: Lamar LULLA Leonor Mickey., MD;  Location: [facility];  Service: Orthopedics;  Laterality: Right;   EXPLORATORY LAPAROTOMY  06/30/2007   ATTEMPTED HYSTERECTOMY ABORTED DUE TO EXTENSIVE ENDOMETRIOSIS W/ DENSE PELVIC ADHESIONS INVOLVING UTERUS AND LOWER RECTOSIGMOID   EYE SURGERY Bilateral 2022   cataracts   HYSTEROSCOPY WITH D & C  05/06/2009   TRANSFORAMINAL LUMBAR INTERBODY FUSION W/ MIS 1 LEVEL Right 06/08/2021   Procedure: Mininmally Invasive , Decompression Transforaminal Lumbar Interbody Fusion Lumbar four-five;  Surgeon: Dawley, Lani BROCKS, DO;  Location: [facility];  Service: Neurosurgery;   Laterality: Right;     Current Outpatient Medications on File Prior to Visit  Medication Sig Dispense Refill   albuterol  (VENTOLIN  HFA) 108 (90 Base) MCG/ACT inhaler Inhale 1 puff into the lungs every 6 (six) hours as needed for wheezing or shortness of breath.     Ascorbic Acid (VITAMIN C) 1000 MG tablet Take 1,000 mg by mouth daily.     aspirin  81 MG tablet Take 1 tablet (81 mg total) by mouth daily. 30 tablet    Cholecalciferol (VITAMIN D ) 50 MCG (2000 UT) CAPS Take 2,000 Units by mouth daily.     dorzolamide-timolol  (COSOPT) 2-0.5 % ophthalmic solution INSTILL 1 DROP INTO EACH EYE TWICE DAILY     fluticasone  (FLONASE ) 50 MCG/ACT nasal spray Place 1 spray into both nostrils daily. (Patient taking differently: Place 1 spray into both nostrils as needed.) 16 g 5   glucose blood test strip Use as instructed 100 each 4   lisinopril  (ZESTRIL ) 10 MG tablet Take 10 mg by mouth daily.     loratadine  (CLARITIN ) 10 MG tablet Take 1 tablet (10 mg total) by mouth daily. (Patient taking differently: Take 10 mg by mouth every other day.)     metFORMIN  (GLUCOPHAGE ) 500 MG tablet Take 1 tablet (500 mg total) by mouth daily with breakfast. (Patient taking differently: Take 500 mg by mouth  2 (two) times daily with a meal.) 90 tablet 1   metoprolol  succinate (TOPROL -XL) 25 MG 24 hr tablet Take 1 tablet by mouth once daily 90 tablet 0   OVER THE COUNTER MEDICATION Take 1 tablet by mouth 2 (two) times daily. advanced eye health complex     Polyethyl Glycol-Propyl Glycol (SYSTANE OP) Place 1 drop into both eyes daily as needed (Dry eye).     simvastatin  (ZOCOR ) 20 MG tablet TAKE 1 TABLET BY MOUTH AT BEDTIME 90 tablet 0   UNABLE TO FIND Med Name: [place] eye drops     Zinc 50 MG TABS Take 50 mg by mouth daily.     Brimonidine  Tartrate-Timolol  (COMBIGAN  OP) Place 1 drop into both eyes 2 (two) times daily.     Turmeric Curcumin 500 MG CAPS Take 500 mg by mouth daily.     [DISCONTINUED]  lisinopril -hydrochlorothiazide  (ZESTORETIC ) 20-25 MG tablet Take 1 tablet by mouth daily. 90 tablet 3   Current Facility-Administered Medications on File Prior to Visit  Medication Dose Route Frequency Provider Last Rate Last Admin   EPINEPHrine  (Anaphylaxis) SOLN 0.3 mg  0.3 mg Intramuscular Once Bobbitt, Elgin Pepper, MD        Allergies  Allergen Reactions   Peanut  (Diagnostic) Cough, Hypertension and Shortness Of Breath   Adhesive [Tape] Rash   Atorvastatin Other (See Comments)    Myalgia   Crestor  [Rosuvastatin ] Other (See Comments)    Severe arthralgia and myalgia   Latex Itching and Other (See Comments)    Causes blisters   Lovastatin  Other (See Comments)    Arthralgias, hyperglycemia with lovastatin .     DIAGNOSTIC DATA (LABS, IMAGING, TESTING) - I reviewed patient records, labs, notes, testing and imaging myself where available.  Lab Results  Component Value Date   WBC 7.2 06/05/2021   HGB 12.2 06/05/2021   HCT 37.2 06/05/2021   MCV 89.0 06/05/2021   PLT 250 06/05/2021      Component Value Date/Time   NA 140 06/05/2021 1030   NA 138 04/14/2020 1105   NA 136 06/24/2014 0757   K 4.2 06/05/2021 1030   K 4.3 06/24/2014 0757   CL 103 06/05/2021 1030   CL 104 08/27/2012 1219   CO2 28 06/05/2021 1030   CO2 25 06/24/2014 0757   GLUCOSE 142 (H) 06/05/2021 1030   GLUCOSE 164 (H) 06/24/2014 0757   GLUCOSE 142 (H) 08/27/2012 1219   BUN 12 06/05/2021 1030   BUN 17 04/14/2020 1105   BUN 14.3 06/24/2014 0757   CREATININE 0.84 06/05/2021 1030   CREATININE 0.77 12/22/2015 0940   CREATININE 0.8 06/24/2014 0757   CALCIUM  10.1 06/05/2021 1030   CALCIUM  9.8 06/24/2014 0757   PROT 7.0 04/14/2020 1105   PROT 7.2 06/24/2014 0757   ALBUMIN 4.1 04/14/2020 1105   ALBUMIN 3.8 06/24/2014 0757   AST 26 04/14/2020 1105   AST 16 06/24/2014 0757   ALT 16 04/14/2020 1105   ALT 19 06/24/2014 0757   ALKPHOS 93 04/14/2020 1105   ALKPHOS 157 (H) 06/24/2014 0757   BILITOT 0.6  04/14/2020 1105   BILITOT 0.45 06/24/2014 0757   GFRNONAA >60 06/05/2021 1030   GFRNONAA 88 05/09/2015 1050   GFRAA 86 04/14/2020 1105   GFRAA >89 05/09/2015 1050   Lab Results  Component Value Date   CHOL 228 (H) 04/14/2020   HDL 42 04/14/2020   LDLCALC 157 (H) 04/14/2020   TRIG 157 (H) 04/14/2020   CHOLHDL 5.4 (H) 04/14/2020   Lab  Results  Component Value Date   HGBA1C 7.0 (H) 04/14/2020   No results found for: [id number] Lab Results  Component Value Date   TSH 2.010 04/14/2020    PHYSICAL EXAM:  Today's Vitals   04/25/23 1022  BP: 139/70  Pulse: (!) 101  Weight: 214 lb (97.1 kg)  Height: 5' (1.524 m)   Body mass index is 41.79 kg/m.   Wt Readings from Last 3 Encounters:  04/25/23 214 lb (97.1 kg)  04/17/22 222 lb (100.7 kg)  06/08/21 215 lb (97.5 kg)     Ht Readings from Last 3 Encounters:  04/25/23 5' (1.524 m)  04/17/22 5' (1.524 m)  06/08/21 5' (1.524 m)      General: The patient is awake, alert and appears not in acute distress. The patient is well groomed. Head: Normocephalic, atraumatic.  Runny nose,  red, bloodshot eyes,  flushed face.  Neck is supple. Body mass index is 43.36 kg/m.  Left breast mastectomy.  Lymphedema, in left arm.  Ankle edema noted.  No Lymphedema on the left arm !  Edema in feet and legs, with a sore ulcer on the right medial  lower leg.   Wheeping.    Neck Circumference : 17.5 inches, Mallampati 2-  Slightly hoarse.  Generalized: Well developed, in no acute distress, well groomed.   Neurological examination  Mentation: Alert oriented to time, place, history taking.  Follows all commands speech and language fluent Cranial nerve:  Taste and smell have recovered- impaired by chemotherapy. . Pupils are equally round and responsive.   Uvula and tongue move in midline.  Head turning and shoulder shrug  were normal and symmetric.  Motor: restricted  ROM in all proximal 4 extremities. Pain, myalgia -  Symmetric grip,  but no tremors, . Coordination:  deferred Gait and station: Gait is wide based. This is obesity related.  Balance problems when getting up in AM.     ASSESSMENT AND PLAN:   Left Breast cancer/ survivor, status post radiation. Cold sensitive , gaining weight. Lymphedema in lef upper extremity.   78 y.o. year old female  here with: OSA,  BMI is main risk factor.  41.8 today.     1)  Very good compliance on CPAP, low residual AHI> but she cannot tighten her headgear any further - uncomfortable.   2) very limited eye sight-  progression of glaucoma and macular degeneration.  Air leakage can additionally irritate her eyes.  She is using a wisp small/ medium size.  I have  given her today a N20 airtouch by ResMed in small, this one is lined by memory foam and hopefully prevent airleak and doesn't cut into the facial skin .   3)  waiting to see the wound center for leg ulcer.    I plan to follow up either personally or through our NP within 12 months.   I would like to thank Tanda Eke, MD and Barbra Odor, Np [address] [place],  [place] [address] for allowing me to meet with and to take care of this pleasant patient.  She will need a new new baseline in 2026.    After spending a total time of  25  minutes face to face and additional time for physical and neurologic examination, review of laboratory studies,  personal review of imaging studies, reports and results of other testing and review of referral information / records as far as provided in visit,   Electronically signed by: Dedra Gores, MD 04/25/2023  10:34 AM  [facility] and [employer] certified by [employer] of Sleep Medicine and Diplomate of the [employer] of Sleep Medicine. Board certified In Neurology through the ABPN, Fellow of the [employer] of Neurology.

## 2023-05-28 ENCOUNTER — Encounter (HOSPITAL_BASED_OUTPATIENT_CLINIC_OR_DEPARTMENT_OTHER): Payer: PRIVATE HEALTH INSURANCE | Attending: Internal Medicine | Admitting: Internal Medicine

## 2023-05-28 DIAGNOSIS — E11622 Type 2 diabetes mellitus with other skin ulcer: Secondary | ICD-10-CM | POA: Insufficient documentation

## 2023-05-28 DIAGNOSIS — E11621 Type 2 diabetes mellitus with foot ulcer: Secondary | ICD-10-CM | POA: Insufficient documentation

## 2023-05-28 DIAGNOSIS — S80811A Abrasion, right lower leg, initial encounter: Secondary | ICD-10-CM | POA: Insufficient documentation

## 2023-05-28 DIAGNOSIS — I87311 Chronic venous hypertension (idiopathic) with ulcer of right lower extremity: Secondary | ICD-10-CM | POA: Insufficient documentation

## 2023-05-28 DIAGNOSIS — L97812 Non-pressure chronic ulcer of other part of right lower leg with fat layer exposed: Secondary | ICD-10-CM | POA: Insufficient documentation

## 2023-06-04 ENCOUNTER — Encounter (HOSPITAL_BASED_OUTPATIENT_CLINIC_OR_DEPARTMENT_OTHER): Payer: PRIVATE HEALTH INSURANCE | Admitting: Internal Medicine

## 2023-06-04 DIAGNOSIS — I87311 Chronic venous hypertension (idiopathic) with ulcer of right lower extremity: Secondary | ICD-10-CM | POA: Diagnosis not present

## 2023-06-04 DIAGNOSIS — S80811A Abrasion, right lower leg, initial encounter: Secondary | ICD-10-CM

## 2023-06-04 DIAGNOSIS — E11622 Type 2 diabetes mellitus with other skin ulcer: Secondary | ICD-10-CM | POA: Diagnosis not present

## 2023-06-04 DIAGNOSIS — L97812 Non-pressure chronic ulcer of other part of right lower leg with fat layer exposed: Secondary | ICD-10-CM | POA: Diagnosis not present

## 2023-06-04 DIAGNOSIS — E11621 Type 2 diabetes mellitus with foot ulcer: Secondary | ICD-10-CM | POA: Diagnosis not present

## 2023-06-11 ENCOUNTER — Encounter (HOSPITAL_BASED_OUTPATIENT_CLINIC_OR_DEPARTMENT_OTHER): Payer: PRIVATE HEALTH INSURANCE | Admitting: Internal Medicine

## 2023-06-11 DIAGNOSIS — L97812 Non-pressure chronic ulcer of other part of right lower leg with fat layer exposed: Secondary | ICD-10-CM | POA: Diagnosis not present

## 2023-06-11 DIAGNOSIS — E11622 Type 2 diabetes mellitus with other skin ulcer: Secondary | ICD-10-CM | POA: Diagnosis not present

## 2023-06-11 DIAGNOSIS — I87311 Chronic venous hypertension (idiopathic) with ulcer of right lower extremity: Secondary | ICD-10-CM | POA: Diagnosis not present

## 2023-06-11 DIAGNOSIS — E11621 Type 2 diabetes mellitus with foot ulcer: Secondary | ICD-10-CM | POA: Diagnosis not present

## 2023-06-18 ENCOUNTER — Encounter (HOSPITAL_BASED_OUTPATIENT_CLINIC_OR_DEPARTMENT_OTHER): Payer: PRIVATE HEALTH INSURANCE | Attending: Internal Medicine | Admitting: Internal Medicine

## 2023-06-18 DIAGNOSIS — L97812 Non-pressure chronic ulcer of other part of right lower leg with fat layer exposed: Secondary | ICD-10-CM | POA: Insufficient documentation

## 2023-06-18 DIAGNOSIS — I87311 Chronic venous hypertension (idiopathic) with ulcer of right lower extremity: Secondary | ICD-10-CM | POA: Insufficient documentation

## 2023-06-18 DIAGNOSIS — S80811A Abrasion, right lower leg, initial encounter: Secondary | ICD-10-CM | POA: Diagnosis not present

## 2023-06-18 DIAGNOSIS — E11622 Type 2 diabetes mellitus with other skin ulcer: Secondary | ICD-10-CM | POA: Insufficient documentation

## 2023-06-18 DIAGNOSIS — G4733 Obstructive sleep apnea (adult) (pediatric): Secondary | ICD-10-CM | POA: Insufficient documentation

## 2023-06-25 ENCOUNTER — Encounter (HOSPITAL_BASED_OUTPATIENT_CLINIC_OR_DEPARTMENT_OTHER): Payer: PRIVATE HEALTH INSURANCE | Admitting: Internal Medicine

## 2023-06-25 DIAGNOSIS — E11622 Type 2 diabetes mellitus with other skin ulcer: Secondary | ICD-10-CM | POA: Diagnosis not present

## 2023-06-25 DIAGNOSIS — I87311 Chronic venous hypertension (idiopathic) with ulcer of right lower extremity: Secondary | ICD-10-CM | POA: Diagnosis present

## 2023-06-25 DIAGNOSIS — S80811A Abrasion, right lower leg, initial encounter: Secondary | ICD-10-CM | POA: Diagnosis not present

## 2023-06-25 DIAGNOSIS — L97812 Non-pressure chronic ulcer of other part of right lower leg with fat layer exposed: Secondary | ICD-10-CM | POA: Diagnosis not present

## 2023-06-25 DIAGNOSIS — G4733 Obstructive sleep apnea (adult) (pediatric): Secondary | ICD-10-CM | POA: Diagnosis not present

## 2024-04-30 ENCOUNTER — Ambulatory Visit: Payer: Medicare Other | Admitting: Adult Health
